# Patient Record
Sex: Female | Born: 1970 | Race: Black or African American | Hispanic: No | Marital: Single | Smoking: Never smoker
Health system: Southern US, Community
[De-identification: ages and names within clinical notes are randomized; demographics above are authoritative.]

## PROBLEM LIST (undated history)

## (undated) DIAGNOSIS — N189 Chronic kidney disease, unspecified: Secondary | ICD-10-CM

## (undated) DIAGNOSIS — I10 Essential (primary) hypertension: Secondary | ICD-10-CM

## (undated) DIAGNOSIS — I5030 Unspecified diastolic (congestive) heart failure: Secondary | ICD-10-CM

## (undated) DIAGNOSIS — I509 Heart failure, unspecified: Secondary | ICD-10-CM

## (undated) DIAGNOSIS — D649 Anemia, unspecified: Secondary | ICD-10-CM

## (undated) HISTORY — DX: Chronic kidney disease, unspecified: N18.9

## (undated) HISTORY — DX: Essential (primary) hypertension: I10

## (undated) HISTORY — DX: Unspecified diastolic (congestive) heart failure: I50.30

## (undated) HISTORY — PX: EYE SURGERY: SHX253

## (undated) HISTORY — PX: TUBAL LIGATION: SHX77

## (undated) HISTORY — PX: CHOLECYSTECTOMY: SHX55

---

## 2000-07-13 ENCOUNTER — Ambulatory Visit (HOSPITAL_COMMUNITY): Admission: RE | Admit: 2000-07-13 | Discharge: 2000-07-13 | Payer: Self-pay | Admitting: General Surgery

## 2001-04-22 ENCOUNTER — Encounter: Payer: Self-pay | Admitting: Emergency Medicine

## 2001-04-22 ENCOUNTER — Emergency Department (HOSPITAL_COMMUNITY): Admission: EM | Admit: 2001-04-22 | Discharge: 2001-04-22 | Payer: Self-pay | Admitting: Emergency Medicine

## 2002-05-24 ENCOUNTER — Emergency Department (HOSPITAL_COMMUNITY): Admission: EM | Admit: 2002-05-24 | Discharge: 2002-05-24 | Payer: Self-pay | Admitting: Internal Medicine

## 2002-06-09 ENCOUNTER — Encounter: Payer: Self-pay | Admitting: Emergency Medicine

## 2002-06-09 ENCOUNTER — Emergency Department (HOSPITAL_COMMUNITY): Admission: EM | Admit: 2002-06-09 | Discharge: 2002-06-09 | Payer: Self-pay | Admitting: Emergency Medicine

## 2002-08-07 ENCOUNTER — Emergency Department (HOSPITAL_COMMUNITY): Admission: EM | Admit: 2002-08-07 | Discharge: 2002-08-07 | Payer: Self-pay | Admitting: *Deleted

## 2003-01-18 ENCOUNTER — Emergency Department (HOSPITAL_COMMUNITY): Admission: EM | Admit: 2003-01-18 | Discharge: 2003-01-18 | Payer: Self-pay | Admitting: Emergency Medicine

## 2003-06-16 ENCOUNTER — Emergency Department (HOSPITAL_COMMUNITY): Admission: EM | Admit: 2003-06-16 | Discharge: 2003-06-16 | Payer: Self-pay | Admitting: Emergency Medicine

## 2003-10-13 ENCOUNTER — Inpatient Hospital Stay (HOSPITAL_COMMUNITY): Admission: AD | Admit: 2003-10-13 | Discharge: 2003-10-15 | Payer: Self-pay | Admitting: Pediatrics

## 2004-09-21 ENCOUNTER — Encounter: Admission: RE | Admit: 2004-09-21 | Discharge: 2004-12-20 | Payer: Self-pay | Admitting: Family Medicine

## 2005-01-02 ENCOUNTER — Ambulatory Visit (HOSPITAL_COMMUNITY): Admission: RE | Admit: 2005-01-02 | Discharge: 2005-01-02 | Payer: Self-pay | Admitting: Family Medicine

## 2005-09-03 ENCOUNTER — Emergency Department (HOSPITAL_COMMUNITY): Admission: EM | Admit: 2005-09-03 | Discharge: 2005-09-03 | Payer: Self-pay | Admitting: Emergency Medicine

## 2005-12-03 ENCOUNTER — Emergency Department (HOSPITAL_COMMUNITY): Admission: EM | Admit: 2005-12-03 | Discharge: 2005-12-03 | Payer: Self-pay | Admitting: Emergency Medicine

## 2005-12-12 ENCOUNTER — Encounter (HOSPITAL_COMMUNITY): Admission: RE | Admit: 2005-12-12 | Discharge: 2006-01-11 | Payer: Self-pay | Admitting: Pediatrics

## 2006-09-05 ENCOUNTER — Ambulatory Visit (HOSPITAL_COMMUNITY): Admission: RE | Admit: 2006-09-05 | Discharge: 2006-09-05 | Payer: Self-pay | Admitting: Family Medicine

## 2006-09-09 ENCOUNTER — Emergency Department (HOSPITAL_COMMUNITY): Admission: EM | Admit: 2006-09-09 | Discharge: 2006-09-09 | Payer: Self-pay | Admitting: Emergency Medicine

## 2006-09-22 ENCOUNTER — Emergency Department (HOSPITAL_COMMUNITY): Admission: EM | Admit: 2006-09-22 | Discharge: 2006-09-22 | Payer: Self-pay | Admitting: Emergency Medicine

## 2006-09-24 ENCOUNTER — Emergency Department (HOSPITAL_COMMUNITY): Admission: EM | Admit: 2006-09-24 | Discharge: 2006-09-24 | Payer: Self-pay | Admitting: Emergency Medicine

## 2006-10-12 ENCOUNTER — Ambulatory Visit (HOSPITAL_COMMUNITY): Admission: RE | Admit: 2006-10-12 | Discharge: 2006-10-12 | Payer: Self-pay | Admitting: *Deleted

## 2006-11-10 ENCOUNTER — Emergency Department (HOSPITAL_COMMUNITY): Admission: EM | Admit: 2006-11-10 | Discharge: 2006-11-10 | Payer: Self-pay | Admitting: Emergency Medicine

## 2006-12-08 ENCOUNTER — Emergency Department (HOSPITAL_COMMUNITY): Admission: EM | Admit: 2006-12-08 | Discharge: 2006-12-08 | Payer: Self-pay | Admitting: Emergency Medicine

## 2007-05-08 ENCOUNTER — Other Ambulatory Visit: Admission: RE | Admit: 2007-05-08 | Discharge: 2007-05-08 | Payer: Self-pay | Admitting: Obstetrics & Gynecology

## 2007-06-14 ENCOUNTER — Inpatient Hospital Stay (HOSPITAL_COMMUNITY): Admission: AD | Admit: 2007-06-14 | Discharge: 2007-06-14 | Payer: Self-pay | Admitting: Gynecology

## 2007-06-19 ENCOUNTER — Ambulatory Visit (HOSPITAL_COMMUNITY): Admission: RE | Admit: 2007-06-19 | Discharge: 2007-06-19 | Payer: Self-pay | Admitting: Obstetrics & Gynecology

## 2007-06-20 ENCOUNTER — Emergency Department (HOSPITAL_COMMUNITY): Admission: EM | Admit: 2007-06-20 | Discharge: 2007-06-20 | Payer: Self-pay | Admitting: Emergency Medicine

## 2007-07-17 ENCOUNTER — Ambulatory Visit (HOSPITAL_COMMUNITY): Admission: RE | Admit: 2007-07-17 | Discharge: 2007-07-17 | Payer: Self-pay | Admitting: Obstetrics & Gynecology

## 2007-10-16 ENCOUNTER — Inpatient Hospital Stay (HOSPITAL_COMMUNITY): Admission: AD | Admit: 2007-10-16 | Discharge: 2007-10-16 | Payer: Self-pay | Admitting: Family Medicine

## 2007-10-28 ENCOUNTER — Ambulatory Visit (HOSPITAL_COMMUNITY): Admission: RE | Admit: 2007-10-28 | Discharge: 2007-10-28 | Payer: Self-pay | Admitting: Obstetrics & Gynecology

## 2007-11-03 ENCOUNTER — Inpatient Hospital Stay (HOSPITAL_COMMUNITY): Admission: AD | Admit: 2007-11-03 | Discharge: 2007-11-06 | Payer: Self-pay | Admitting: Obstetrics & Gynecology

## 2007-11-03 ENCOUNTER — Encounter: Payer: Self-pay | Admitting: Obstetrics & Gynecology

## 2008-09-18 ENCOUNTER — Emergency Department (HOSPITAL_COMMUNITY): Admission: EM | Admit: 2008-09-18 | Discharge: 2008-09-18 | Payer: Self-pay | Admitting: Emergency Medicine

## 2008-10-26 ENCOUNTER — Emergency Department (HOSPITAL_COMMUNITY): Admission: EM | Admit: 2008-10-26 | Discharge: 2008-10-26 | Payer: Self-pay | Admitting: Emergency Medicine

## 2009-10-28 ENCOUNTER — Emergency Department (HOSPITAL_COMMUNITY): Admission: EM | Admit: 2009-10-28 | Discharge: 2009-10-28 | Payer: Self-pay | Admitting: Emergency Medicine

## 2010-03-20 ENCOUNTER — Encounter: Payer: Self-pay | Admitting: Obstetrics & Gynecology

## 2010-04-19 ENCOUNTER — Emergency Department (HOSPITAL_COMMUNITY): Payer: Medicaid Other

## 2010-04-19 ENCOUNTER — Emergency Department (HOSPITAL_COMMUNITY)
Admission: EM | Admit: 2010-04-19 | Discharge: 2010-04-19 | Disposition: A | Discharge status: Home / Self Care | Payer: Medicaid Other | Attending: Emergency Medicine | Admitting: Emergency Medicine

## 2010-04-19 DIAGNOSIS — M25519 Pain in unspecified shoulder: Secondary | ICD-10-CM | POA: Insufficient documentation

## 2010-04-24 ENCOUNTER — Emergency Department (HOSPITAL_COMMUNITY)
Admission: EM | Admit: 2010-04-24 | Discharge: 2010-04-25 | Disposition: A | Discharge status: Home / Self Care | Payer: No Typology Code available for payment source | Attending: Emergency Medicine | Admitting: Emergency Medicine

## 2010-04-24 DIAGNOSIS — IMO0002 Reserved for concepts with insufficient information to code with codable children: Secondary | ICD-10-CM | POA: Insufficient documentation

## 2010-04-24 DIAGNOSIS — Y92410 Unspecified street and highway as the place of occurrence of the external cause: Secondary | ICD-10-CM | POA: Insufficient documentation

## 2010-07-12 NOTE — Op Note (Signed)
NAMETAINA, DYSERT                ACCOUNT NO.:  [id number]   MEDICAL RECORD NO.:  [id number]          PATIENT TYPE:  INP   LOCATION:  9101                          FACILITY:  [facility]   PHYSICIAN:  Florian Buff, M.D.   DATE OF BIRTH:  1970-08-05   DATE OF PROCEDURE:  11/03/2007  DATE OF DISCHARGE:  11/06/2007                               OPERATIVE REPORT   PREOPERATIVE DIAGNOSES:  1. Intrauterine pregnancy at 21 weeks' gestation.  2. Previous cesarean section.  3. Hypertension.  4. Class B diabetes.   POSTOPERATIVE DIAGNOSES:  1. Intrauterine pregnancy at 85 weeks' gestation.  2. Previous cesarean section.  3. Hypertension.  4. Class B diabetes.   PROCEDURE:  1. Repeat transverse cesarean section.  2. Tubal ligation.   SURGEON:  Florian Buff, MD   ANESTHESIA:  Spinal.   FINDINGS:  The patient had 2 previous C-sections.  She is class B  diabetic.  She is planning to undergo a repeat.  She came in with  rupture of membranes, stable for planned surgery.  She delivered a 5-  pound 12-ounce female infant with Apgars of 9 and 9 and 3-vessel cord.  Cord blood and cord gases were sent.  Placenta was normal.  Uterus,  tubes, and ovaries were normal.  The patient desires tubal ligation.   DESCRIPTION OF OPERATION:  The patient was taken to the operating room  and placed in the sitting position with spinal anesthetic placed in  supine position with uterine tilt to the left, prepped and draped in  usual sterile fashion.  Foley catheter was placed.  Pfannenstiel skin  incision was made and carried down sharply into the rectus fascia, which  was scored in midline and extended laterally.  Fascia taken off the  muscles superiorly and inferiorly without difficulty.  The muscles were  divided.  Peritoneal cavity was entered.  bladder blade was placed.  Vesicouterine serosal flap was created.  A low-transverse hysterotomy  incision was made.  Over the incision was delivered a viable female  infant  at 10:46 on November 03, 2007, weighing 5 pounds 12 ounces, Apgars 9 and  9, 3-vessel cord.  Cord blood and cord gases were sent.  Placenta was  normal and intact.  Uterus exteriorized, closed in two layers, first  being running-interlocking layer with the second being imbricating  layer.  Modified Pomeroy bilateral ligation was performed.  The uterine  time was achieved.  All pedicles were hemostatic.  Pelvis was irrigated  vigorously.  The uterus replaced in peritoneal cavity.  Muscle of the  peritoneum reapproximated loosely.  Fascia  closed using 0-Vicryl running.  Subcutaneous tissues were made  hemostatic and irrigated.  Skin was closed using skin staples.  The  patient tolerated the procedure well.  She experienced 750 mL blood loss  and was taken to recovery room in good stable condition with all counts  correct x3.      Florian Buff, M.D.  Electronically Signed     LHE/MEDQ  D:  01/08/2008  T:  01/09/2008  Job:  [id number]

## 2010-07-12 NOTE — Procedures (Signed)
Ana [name], Ana [name]                ACCOUNT NO.:  [id number]   MEDICAL RECORD NO.:  [id number]          PATIENT TYPE:  OUT   LOCATION:  RAD                           FACILITY:  [facility]   PHYSICIAN:  Leslye Peer, MD       DATE OF BIRTH:  1970-03-21   DATE OF PROCEDURE:  10/12/2006  DATE OF DISCHARGE:                                ECHOCARDIOGRAM   REFERRING:  Dr. Cleta Alberts and Dr. Mathis Bud.   INDICATIONS:  A 40 year old female with hypertension and diabetes  referred for evaluation of LV function due to chest pain.   Technical quality of this study is adequate.   The aorta measures normally at 2.2 cm.   Left atrium also measures normally at 3.9 cm.   The interventricular septum and posterior wall are both mildly thickened  measured at 1.5 cm and 1.3 centimeters, respectively.   The aortic valve is trileaflet and structurally normal.  No limitation  to leaflet excursion is appreciated.  No aortic insufficiency is noted.  Doppler interrogation of the aortic valve reveals a peak velocity of 1.6  meters per second corresponding to a peak rate of 10 mmHg and a mean  gradient of 6 mmHg.   The mitral valve appears grossly structurally normal.  No mitral valve  prolapse is noted.  Trivial mitral regurgitation is noted.  Doppler  interrogation of mitral valve is within normal limits.   Pulmonic valve is structurally normal.  No significant pulmonic  insufficiency is noted.   Tricuspid valve also appears grossly structurally normal with trivial  tricuspid regurgitation noted.   The left ventricle appears normal in size.  Overall left systolic  function is normal.  No regional wall motion abnormalities are noted.   The right atrium appears grossly normal in size while the right  ventricle is moderately dilated.  Right ventricular systolic function  appears to be preserved.   IMPRESSION:  1. Mild concentric LVH.  2. Mild aortic sclerosis by Doppler interrogation.  However, visually,  the leaflets appear normal without limitation.  3. Trivial mitral and tricuspid regurgitation.  4. Normal left size and systolic function without regional wall motion      abnormality noted.  5. Moderate right ventricular enlargement with preserved right      ventricle systolic function.  6. In the subcostal view only, there is a subtle suggestion of a      possible PFO or ASD across the inner atrial septum; however, this      is not appreciated elsewhere.  This may well be inflow from the      IVC.  Consider bubble study if clinically indicated.           ______________________________  Leslye Peer, MD     AB/MEDQ  D:  10/12/2006  T:  10/13/2006  Job:  [id number]   cc:   Leslye Peer, MD  Fax: [phone]   Cleta Alberts, M.D.

## 2010-07-15 NOTE — Discharge Summary (Signed)
NAME:  [name], [name]                          ACCOUNT NO.:  [id number]   MEDICAL RECORD NO.:  [id number]                   PATIENT TYPE:  INP   LOCATION:  A325                                 FACILITY:  [facility]   PHYSICIAN:  Micah Flesher. Halm, D.O.                DATE OF BIRTH:  10-23-1970   DATE OF ADMISSION:  10/13/2003  DATE OF DISCHARGE:  10/15/2003                                 DISCHARGE SUMMARY   FINAL DIAGNOSES:  1. Hyperglycemia.  2. Diabetes mellitus, type 2.  3. Poor compliance.  4. Dehydration.   HISTORY OF PRESENT ILLNESS:  Ana [name] is a 40 year old female with known  type 2 diabetes with poor medical compliance.  She had not been taking her  medications over the weeks prior to admission.  One week prior to this  admission, she was evaluated in my office and noted to be dehydrated and  have hyperglycemia.  We restarted her glyburide, and she was encouraged to  drink plenty of fluids.  She was evaluated one week later and felt to be  only minimally better and felt worse.  She was admitted to the hospital for  further management.   HOSPITAL COURSE:  Ms. Dall was admitted to the hospital and placed on  intravenous fluids.  She was placed on a sliding scale insulin to help  normalize her high glucose levels.  Initially, her glucoses were in the mid  to high 200 range.   While in the hospital, we obtained fairly good improvement of her  hyperglycemic state.  She was also reinstituted on her oral medications.  Her ambulation status improved slowly while in the hospital, and she was  discharged in stable condition.   DISCHARGE INSTRUCTIONS:  The patient was discharged to home and instructed  to increase activity slowly.  She was advised and educated on a no  concentrated sweets, 2200 calorie per day diabetic diet.  She is to avoid  caffeine drinks.   FOLLOW UP:  She is to follow up with our office in one to two weeks.   DISCHARGE MEDICATIONS:  1. Glyburide 5 mg, two  tablets twice a day.  2. Glucophage 500 mg, one tablet twice a day.  3. Tylenol 500 mg, two tablets every six hours as needed.     ___________________________________________                                         Micah Flesher. Cleta Alberts, D.O.   [facility]/MEDQ  D:  10/22/2003  T:  10/22/2003  Job:  [id number]

## 2010-07-15 NOTE — Procedures (Signed)
Ana, [name]                ACCOUNT NO.:  [id number]   MEDICAL RECORD NO.:  [id number]          PATIENT TYPE:  OUT   LOCATION:  RESP                          FACILITY:  [facility]   PHYSICIAN:  Edward L. Luan Pulling, M.D.DATE OF BIRTH:  [birth year]   DATE OF PROCEDURE:  01/02/2005  DATE OF DISCHARGE:                              PULMONARY FUNCTION TEST   RESULTS:  This is an inadequate pulmonary function test.  The patient was  unable to maintain forced expiration and data is unreliable.      Edward L. Luan Pulling, M.D.  Electronically Signed     [facility]/MEDQ  D:  01/09/2005  T:  01/10/2005  Job:  [id number]

## 2010-07-15 NOTE — H&P (Signed)
NAME:  Ana [name], Ana [name]                          ACCOUNT NO.:  [id number]   MEDICAL RECORD NO.:  [id number]                   PATIENT TYPE:  INP   LOCATION:  A325                                 FACILITY:  [facility]   PHYSICIAN:  Tammi Sou, M.D.             DATE OF BIRTH:  1971/02/01   DATE OF ADMISSION:  10/13/2003  DATE OF DISCHARGE:                                HISTORY & PHYSICAL   CHIEF COMPLAINT:  Headache and shaking.   HISTORY OF PRESENT ILLNESS:  Neoma Laming is a 40 year old African-American  female with a history of type 2 diabetes and medical noncompliance.  She has  had approximately 1-2 weeks of intermittent nausea, loss of appetite,  feeling of fatigue and malaise.  She denies any fever, cough, or dysuria.  She has been to the clinic once in the last 1 week and noted to have  hyperglycemia and mild dehydration and was noncompliant with her medications  and glucose monitoring at that point.  She was encouraged to drink lots of  fluids and she was placed back on Glyburide 10 mg b.i.d.  She did feel  better for a couple of days; but, again, stopped eating and still feels weak  and does not want to eat anything.  She says that she is not thirsty at all.  She is still having lots of urination.  She has mild abdominal pain  diffusely.  No vomiting or diarrhea.  Her last bowel movement was yesterday.  She is not monitoring her glucose because she lost her machine in a move.   PAST MEDICAL HISTORY:  1. Diabetes type 2, non-insulin-requiring.  She has a history of poor     compliance.  2. In 1998 she had pneumonia.   PAST SURGICAL HISTORY:  Cholecystectomy and right breast cyst removal.   MEDICATIONS:  Glyburide 5 mg tabs 2 b.i.d.   ALLERGIES:  No known drug allergies.   SOCIAL HISTORY:  She is married and has 4 children and lives in [place]  and she is not working at this point.  She denies use of tobacco, alcohol,  or drugs.   REVIEW OF SYSTEMS:  See HPI.  In  addition no rash, no vision or hearing  changes, no musculoskeletal pain or swelling.  No melena or hematochezia.   PHYSICAL EXAMINATION:  VITAL SIGNS:  Temperature 97, pulse 82, blood  pressure 137/88, respirations 20, weight 187-1/2 pounds.  GENERAL:  In general she is alert, but very tired appearing and sitting with  a cover over herself.  She is moderately obese.  She is nontoxic appearing.  HEENT:  Pupils are equal, round, and reacted to light.  There are muddy  sclerae bilaterally, but with no icterus or injection.  Tympanic membranes  with good light reflex and landmarks bilaterally.  Oropharynx is pink with  no lesions.  Mucosa is moist.  No erythema.  NECK:  Supple without lymphadenopathy  or thyromegaly.  LUNGS:  Clear to auscultation bilaterally with breathing nonlabored.  CARDIOVASCULAR:  Shows a regular rhythm and rate with no murmur, rub, or  gallop.  ABDOMEN:  Her abdomen is soft with mild diffuse tenderness without rebound  or guarding.  Bowel sounds are normoactive. There is no mass palpated.  No  hepatosplenomegaly appreciated.  Abdomen is rotund.  EXTREMITIES:  No clubbing, cyanosis, or edema.  Normal sensation on the  feet.  No ulcerations seen.   LABS:  Accu-Check in the office 257.  CBC, CMET, lipase are all pending.  Hemoglobin A1c on 10/09/2003 was 12.3%, creatinine was 0.7.  Basic metabolic  panel at that time was normal with the exception of a sodium of 134 and a  glucose of 403.   ASSESSMENT AND PLAN:  Hyperglycemia in a type 2 diabetic:  Attempted to get  the patient under control as an outpatient, but this failed, so we will  admit for IV fluids, monitoring of glucose, insulin as needed, and  observation for improvement.     ___________________________________________                                         Tammi Sou, M.D.   PHM/MEDQ  D:  10/13/2003  T:  10/13/2003  Job:  [id number]

## 2010-08-02 ENCOUNTER — Emergency Department (HOSPITAL_COMMUNITY)
Admission: EM | Admit: 2010-08-02 | Discharge: 2010-08-02 | Disposition: A | Discharge status: Home / Self Care | Payer: Medicaid Other | Attending: Emergency Medicine | Admitting: Emergency Medicine

## 2010-08-02 ENCOUNTER — Emergency Department (HOSPITAL_COMMUNITY): Payer: Medicaid Other

## 2010-08-02 DIAGNOSIS — E119 Type 2 diabetes mellitus without complications: Secondary | ICD-10-CM | POA: Insufficient documentation

## 2010-08-02 DIAGNOSIS — J40 Bronchitis, not specified as acute or chronic: Secondary | ICD-10-CM | POA: Insufficient documentation

## 2010-10-22 ENCOUNTER — Emergency Department (HOSPITAL_COMMUNITY): Payer: Medicaid Other

## 2010-10-22 ENCOUNTER — Emergency Department (HOSPITAL_COMMUNITY)
Admission: EM | Admit: 2010-10-22 | Discharge: 2010-10-22 | Disposition: A | Discharge status: Home / Self Care | Payer: Medicaid Other | Attending: Emergency Medicine | Admitting: Emergency Medicine

## 2010-10-22 ENCOUNTER — Encounter: Payer: Self-pay | Admitting: Emergency Medicine

## 2010-10-22 DIAGNOSIS — E119 Type 2 diabetes mellitus without complications: Secondary | ICD-10-CM | POA: Insufficient documentation

## 2010-10-22 DIAGNOSIS — IMO0002 Reserved for concepts with insufficient information to code with codable children: Secondary | ICD-10-CM | POA: Insufficient documentation

## 2010-10-22 DIAGNOSIS — X500XXA Overexertion from strenuous movement or load, initial encounter: Secondary | ICD-10-CM | POA: Insufficient documentation

## 2010-10-22 MED ORDER — HYDROCODONE-ACETAMINOPHEN 5-325 MG PO TABS: ORAL_TABLET | ORAL | Status: AC

## 2010-10-22 MED ORDER — NAPROXEN 500 MG PO TABS: 500.0000 mg | ORAL_TABLET | Freq: Two times a day (BID) | ORAL | Status: DC

## 2010-10-22 MED ORDER — HYDROCODONE-ACETAMINOPHEN 5-325 MG PO TABS
1.0000 | ORAL_TABLET | Freq: Once | ORAL | Status: AC
  Administered 2010-10-22: 1 via ORAL
  Filled 2010-10-22: qty 1

## 2010-10-22 NOTE — ED Notes (Signed)
Patient c/o right shoulder pain. Denies any injury. Reports being seen here before for same reason. Patient also c/o headache. Denies any nausea or vomiting.

## 2010-10-22 NOTE — ED Provider Notes (Signed)
History     CSN: [id number] Arrival date & time: 10/22/2010 10:39 AM  Chief Complaint  Patient presents with  . Shoulder Pain  . Headache   HPI Comments: Patient c/o right shoulder pain for several days.  States the pain began after lifting her son.  Pain is worse with movement of the shoulder joint.  She denies numbness or weakness of the arm.  States she had had similar sx's in this shoulder previously.  Also c/o frontal headache that began this am.  She denies fever, vomiting , visual change , neck pain or stiffness  Patient is a 40 y.o. female presenting with shoulder pain. The history is provided by the patient.  Shoulder Pain This is a new problem. The current episode started in the past 7 days. The problem occurs constantly. The problem has been unchanged. Associated symptoms include arthralgias and headaches. Pertinent negatives include no abdominal pain, change in bowel habit, chest pain, chills, congestion, coughing, fatigue, fever, joint swelling, myalgias, nausea, neck pain, numbness, rash, sore throat, vomiting or weakness. Exacerbated by: movement. She has tried acetaminophen for the symptoms. The treatment provided no relief.    Past Medical History  Diagnosis Date  . Diabetes mellitus     Past Surgical History  Procedure Date  . Cesarean section   . Cholecystectomy     Family History  Problem Relation Age of Onset  . Kidney failure Mother   . Diabetes Mother   . Kidney failure Father   . Kidney failure Brother   . Diabetes Brother     History  Substance Use Topics  . Smoking status: Never Smoker   . Smokeless tobacco: Never Used  . Alcohol Use: No    OB History    Grav Para Term Preterm Abortions TAB SAB Ect Mult Living   6 5  5 1  1          Review of Systems  Constitutional: Negative for fever, chills, activity change, appetite change and fatigue.  HENT: Negative for congestion, sore throat, neck pain and neck stiffness.   Respiratory: Negative for  cough.   Cardiovascular: Negative for chest pain.  Gastrointestinal: Negative for nausea, vomiting, abdominal pain and change in bowel habit.  Musculoskeletal: Positive for arthralgias. Negative for myalgias, back pain, joint swelling and gait problem.  Skin: Negative for rash.  Neurological: Positive for headaches. Negative for dizziness, facial asymmetry, speech difficulty, weakness and numbness.  Hematological: Does not bruise/bleed easily.  All other systems reviewed and are negative.    Physical Exam  BP 135/71  Pulse 73  Temp(Src) 98.2 F (36.8 C) (Oral)  Resp 17  Ht 5\' 3"  (1.6 m)  Wt 160 lb (72.576 kg)  BMI 28.34 kg/m2  SpO2 100%  LMP 09/28/2010  Physical Exam  Nursing note and vitals reviewed. Constitutional: She is oriented to person, place, and time. She appears well-developed and well-nourished. No distress.  HENT:  Head: Normocephalic and atraumatic.  Right Ear: External ear normal.  Left Ear: External ear normal.  Mouth/Throat: Oropharynx is clear and moist.  Eyes: EOM are normal. Pupils are equal, round, and reactive to light.  Neck: Normal range of motion. Neck supple. No tracheal tenderness, no spinous process tenderness and no muscular tenderness present. No Brudzinski's sign and no Kernig's sign noted. No thyromegaly present.  Cardiovascular: Normal rate, regular rhythm and normal heart sounds.   Pulmonary/Chest: Effort normal and breath sounds normal.  Musculoskeletal: She exhibits tenderness. She exhibits no edema.  Right shoulder: She exhibits tenderness, bony tenderness and crepitus. She exhibits normal range of motion, no swelling, no effusion, no laceration, normal pulse and normal strength.  Lymphadenopathy:    She has no cervical adenopathy.  Neurological: She is alert and oriented to person, place, and time. She has normal reflexes. No cranial nerve deficit. She exhibits normal muscle tone.  Skin: Skin is warm and dry.    ED Course    Procedures  MDM   ttp of the right AC joint.  Pain is reproduced with palp and abduction of the shoulder.  Sensation intact, CR<2 sec, radial pulse is strong, mild crepitus on movement.  No deformity .  I have reviewed the imagining and discussed findings with the patient .  She agrees to close f/u with ortho     Dg Shoulder Right  10/22/2010  *RADIOLOGY REPORT*  Clinical Data: Shoulder pain  RIGHT SHOULDER - 2+ VIEW  Comparison: 04/19/2010  Findings: No evidence for acute fracture.  No shoulder separation or dislocation.  There are some degenerative changes at the acromioclavicular joint.  IMPRESSION: No acute bony findings.  Original Report Authenticated By: ERIC A. MANSELL, M.D.      The patient appears reasonably screened and/or stabilized for discharge and I doubt any other medical condition or other [facility] requiring further screening, evaluation, or treatment in the ED at this time prior to discharge.   Filed Vitals:   10/22/10 1035  BP: 135/71  Pulse: 73  Temp: 98.2 F (36.8 C)  Resp: 17     Phu Record L. [place], [place] 10/22/10 1211

## 2010-10-24 NOTE — ED Provider Notes (Deleted)
Medical screening examination/treatment/procedure(s) were performed by non-physician practitioner and as supervising physician I was immediately available for consultation/collaboration.   Brandace Cargle B. Karle Starch, MD 10/24/10 1616

## 2010-10-28 NOTE — ED Provider Notes (Signed)
Medical screening examination/treatment/procedure(s) were performed by non-physician practitioner and as supervising physician I was immediately available for consultation/collaboration.   Charles B. Karle Starch, MD 10/28/10 1306

## 2010-11-22 LAB — BASIC METABOLIC PANEL
BUN: 6
Calcium: 9.1
GFR calc non Af Amer: >60
Glucose, Bld: 99
Potassium: 3.5

## 2010-11-22 LAB — CBC
HCT: 28.6 — ABNORMAL LOW
Platelets: 316
RDW: 13

## 2010-11-22 LAB — DIFFERENTIAL
Basophils Absolute: 0
Basophils Relative: 1
Eosinophils Relative: 2
Lymphocytes Relative: 26

## 2010-11-22 LAB — URINALYSIS, ROUTINE W REFLEX MICROSCOPIC
Ketones, ur: NEGATIVE
Nitrite: NEGATIVE
Protein, ur: NEGATIVE
Urobilinogen, UA: 2 — ABNORMAL HIGH
pH: 6.5

## 2010-11-30 LAB — GLUCOSE, CAPILLARY
Glucose-Capillary: 102 — ABNORMAL HIGH
Glucose-Capillary: 171 — ABNORMAL HIGH
Glucose-Capillary: 60 — ABNORMAL LOW
Glucose-Capillary: 62 — ABNORMAL LOW
Glucose-Capillary: 63 — ABNORMAL LOW
Glucose-Capillary: 70
Glucose-Capillary: 72
Glucose-Capillary: 76
Glucose-Capillary: 82
Glucose-Capillary: 82
Glucose-Capillary: 84

## 2010-11-30 LAB — CBC
HCT: 20.3 — ABNORMAL LOW
MCHC: 32.7
MCHC: 32.7
MCHC: 33.4
MCV: 86
MCV: 86.3
Platelets: 311
RBC: 2.35 — ABNORMAL LOW
RBC: 2.7 — ABNORMAL LOW
RDW: 15
RDW: 15.1
WBC: 10.4

## 2010-11-30 LAB — RPR: RPR Ser Ql: NONREACTIVE

## 2010-12-08 LAB — URINALYSIS, ROUTINE W REFLEX MICROSCOPIC
Glucose, UA: 100 — AB
Nitrite: NEGATIVE
Specific Gravity, Urine: >1.03 — ABNORMAL HIGH
pH: 6

## 2010-12-12 LAB — BASIC METABOLIC PANEL
BUN: 5 — ABNORMAL LOW
BUN: 7
Calcium: 9
Chloride: 102
Chloride: 103
Creatinine, Ser: 0.47
GFR calc Af Amer: >60
GFR calc Af Amer: >60
GFR calc non Af Amer: >60
GFR calc non Af Amer: >60
Potassium: 3.4 — ABNORMAL LOW
Sodium: 137

## 2010-12-12 LAB — DIFFERENTIAL
Basophils Absolute: 0
Eosinophils Absolute: 0.2
Eosinophils Relative: 2
Lymphocytes Relative: 13
Lymphocytes Relative: 17
Lymphs Abs: 1.4
Lymphs Abs: 1.6
Monocytes Relative: 3
Neutro Abs: 7.3
Neutro Abs: 8.8 — ABNORMAL HIGH
Neutrophils Relative %: 82 — ABNORMAL HIGH

## 2010-12-12 LAB — POCT CARDIAC MARKERS
CKMB, poc: 1.7
CKMB, poc: <1 — ABNORMAL LOW
Myoglobin, poc: 33.9
Myoglobin, poc: 38.6
Operator id: 209231
Operator id: 286941
Troponin i, poc: <0.05

## 2010-12-12 LAB — CBC
HCT: 36.2
MCV: 85.1
Platelets: 354
Platelets: 355
RBC: 4.45
RDW: 12.3
WBC: 10.6 — ABNORMAL HIGH
WBC: 9.5

## 2010-12-13 LAB — CBC
MCV: 85.4
Platelets: 310
RBC: 4.24
WBC: 6.8

## 2010-12-13 LAB — DIFFERENTIAL
Basophils Relative: 1
Eosinophils Absolute: 0.1
Lymphs Abs: 2.5
Neutro Abs: 3.7
Neutrophils Relative %: 54

## 2010-12-13 LAB — HCG, QUANTITATIVE, PREGNANCY: hCG, Beta Chain, Quant, S: 23 — ABNORMAL HIGH

## 2011-01-27 ENCOUNTER — Emergency Department (HOSPITAL_COMMUNITY)
Admission: EM | Admit: 2011-01-27 | Discharge: 2011-01-27 | Disposition: A | Discharge status: Home / Self Care | Payer: Medicaid Other | Attending: Emergency Medicine | Admitting: Emergency Medicine

## 2011-01-27 ENCOUNTER — Encounter (HOSPITAL_COMMUNITY): Payer: Self-pay | Admitting: *Deleted

## 2011-01-27 ENCOUNTER — Emergency Department (HOSPITAL_COMMUNITY): Payer: Medicaid Other

## 2011-01-27 DIAGNOSIS — S62639A Displaced fracture of distal phalanx of unspecified finger, initial encounter for closed fracture: Secondary | ICD-10-CM | POA: Insufficient documentation

## 2011-01-27 DIAGNOSIS — S62609A Fracture of unspecified phalanx of unspecified finger, initial encounter for closed fracture: Secondary | ICD-10-CM

## 2011-01-27 DIAGNOSIS — E119 Type 2 diabetes mellitus without complications: Secondary | ICD-10-CM | POA: Insufficient documentation

## 2011-01-27 DIAGNOSIS — W1809XA Striking against other object with subsequent fall, initial encounter: Secondary | ICD-10-CM | POA: Insufficient documentation

## 2011-01-27 DIAGNOSIS — Y92009 Unspecified place in unspecified non-institutional (private) residence as the place of occurrence of the external cause: Secondary | ICD-10-CM | POA: Insufficient documentation

## 2011-01-27 MED ORDER — NAPROXEN 500 MG PO TABS: 500.0000 mg | ORAL_TABLET | Freq: Two times a day (BID) | ORAL | Status: DC

## 2011-01-27 MED ORDER — HYDROCODONE-ACETAMINOPHEN 5-325 MG PO TABS
1.0000 | ORAL_TABLET | Freq: Once | ORAL | Status: AC
  Administered 2011-01-27: 1 via ORAL
  Filled 2011-01-27: qty 1

## 2011-01-27 MED ORDER — HYDROCODONE-ACETAMINOPHEN 5-325 MG PO TABS: ORAL_TABLET | ORAL | Status: AC

## 2011-01-27 NOTE — ED Notes (Signed)
Fall last night-states lost balance while standing still in kitchen and fell backward landing on left hand; c/o left hand and left middle finger pain; denies LOC

## 2011-01-27 NOTE — ED Provider Notes (Signed)
History     CSN: [id number] Arrival date & time: 01/27/2011  1:31 PM   First MD Initiated Contact with Patient 01/27/11 1337      Chief Complaint  Patient presents with  . Fall  . Hand Pain    (Consider location/radiation/quality/duration/timing/severity/associated sxs/prior treatment) HPI Comments: Patient c/o pain and swelling of her left middle finger.  States she slipped in her kitchen and fell against the stove and injured the finger.  She denies preceding symptoms, numbness or weakness  Patient is a 40 y.o. female presenting with hand pain. The history is provided by the patient.  Hand Pain This is a new problem. The current episode started yesterday. The problem occurs constantly. The problem has been unchanged. Associated symptoms include arthralgias and joint swelling. Pertinent negatives include no chest pain, chills, fever, headaches, myalgias, nausea, neck pain, numbness, rash, sore throat, vomiting or weakness. Exacerbated by: movement and palpation. She has tried nothing for the symptoms. The treatment provided no relief.    Past Medical History  Diagnosis Date  . Diabetes mellitus     Past Surgical History  Procedure Date  . Cesarean section   . Cholecystectomy     Family History  Problem Relation Age of Onset  . Kidney failure Mother   . Diabetes Mother   . Kidney failure Father   . Kidney failure Brother   . Diabetes Brother     History  Substance Use Topics  . Smoking status: Never Smoker   . Smokeless tobacco: Never Used  . Alcohol Use: No    OB History    Grav Para Term Preterm Abortions TAB SAB Ect Mult Living   6 5  5 1  1          Review of Systems  Constitutional: Negative for fever and chills.  HENT: Negative for sore throat, trouble swallowing, neck pain and neck stiffness.   Respiratory: Negative for shortness of breath and wheezing.   Cardiovascular: Negative for chest pain.  Gastrointestinal: Negative for nausea and vomiting.    Musculoskeletal: Positive for joint swelling and arthralgias. Negative for myalgias and back pain.  Skin: Negative for rash and wound.  Neurological: Negative for dizziness, facial asymmetry, weakness, numbness and headaches.  Hematological: Does not bruise/bleed easily.  Psychiatric/Behavioral: Negative for confusion.  All other systems reviewed and are negative.    Allergies  Zantac  Home Medications   Current Outpatient Rx  Name Route Sig Dispense Refill  . ACETAMINOPHEN 500 MG PO TABS Oral Take 1,000 mg by mouth every 6 (six) hours as needed. Pain     . NAPROXEN 500 MG PO TABS Oral Take 1 tablet (500 mg total) by mouth 2 (two) times daily. 21 tablet 0    BP 145/85  Pulse 98  Temp(Src) 98.7 F (37.1 C) (Oral)  Resp 20  Ht 5\' 3"  (1.6 m)  Wt 187 lb (84.823 kg)  BMI 33.13 kg/m2  SpO2 100%  LMP 12/25/2010  Physical Exam  Nursing note and vitals reviewed. Constitutional: She is oriented to person, place, and time. She appears well-developed and well-nourished. No distress.  HENT:  Head: Normocephalic and atraumatic.  Mouth/Throat: Oropharynx is clear and moist.  Neck: Normal range of motion. Neck supple.  Cardiovascular: Normal rate, regular rhythm and normal heart sounds.   Pulmonary/Chest: Effort normal and breath sounds normal.  Musculoskeletal: She exhibits edema and tenderness.       Left hand: She exhibits decreased range of motion, tenderness, bony tenderness and swelling.  She exhibits normal two-point discrimination, normal capillary refill and no laceration. normal sensation noted. Normal strength noted.       Hands: Lymphadenopathy:    She has no cervical adenopathy.  Neurological: She is alert and oriented to person, place, and time. No cranial nerve deficit. She exhibits normal muscle tone. Coordination normal.  Skin: Skin is warm and dry.    ED Course  SPLINT APPLICATION Date/Time: 99991111 3:00 PM Performed by: Vanessa [place], Maydelin Deming L. Authorized by:  Hale Bogus Consent: Verbal consent obtained. Written consent not obtained. Consent given by: patient Patient understanding: patient states understanding of the procedure being performed Patient consent: the patient's understanding of the procedure matches consent given Procedure consent: procedure consent matches procedure scheduled Imaging studies: imaging studies available Patient identity confirmed: verbally with patient Time out: Immediately prior to procedure a "time out" was called to verify the correct patient, procedure, equipment, support staff and site/side marked as required. Location details: left long finger Splint type: static finger Post-procedure: The splinted body part was neurovascularly unchanged following the procedure. Patient tolerance: Patient tolerated the procedure well with no immediate complications.   (including critical care time)  Dg Finger Middle Left  01/27/2011  *RADIOLOGY REPORT*  Clinical Data: [place] last night with pain  LEFT MIDDLE FINGER 2+V  Comparison: None.  Findings: There is a nondisplaced fracture through the base of the distal phalanx of the right third digit.  This fracture does not appear to extend to the DIP joint space.  No other abnormality is seen.  Joint spaces appear normal.  IMPRESSION: Nondisplaced fracture through the base of the distal phalanx of the left third digit.  Original Report Authenticated By: Joretta Bachelor, M.D.        MDM    3:52 PM consulted Dr. Luna Glasgow.  Patient agrees to call his office on Monday to arrange f/u       Anayelli Lai L. [place], [place] 01/28/11 2259

## 2011-01-29 NOTE — ED Provider Notes (Signed)
Medical screening examination/treatment/procedure(s) were performed by non-physician practitioner and as supervising physician I was immediately available for consultation/collaboration.   Mervin Kung, MD 01/29/11 1228

## 2011-07-11 ENCOUNTER — Encounter (HOSPITAL_COMMUNITY): Payer: Self-pay

## 2011-07-11 ENCOUNTER — Emergency Department (HOSPITAL_COMMUNITY)
Admission: EM | Admit: 2011-07-11 | Discharge: 2011-07-11 | Disposition: A | Discharge status: Home / Self Care | Payer: Medicaid Other | Attending: Emergency Medicine | Admitting: Emergency Medicine

## 2011-07-11 ENCOUNTER — Emergency Department (HOSPITAL_COMMUNITY): Payer: Medicaid Other

## 2011-07-11 DIAGNOSIS — R071 Chest pain on breathing: Secondary | ICD-10-CM | POA: Insufficient documentation

## 2011-07-11 DIAGNOSIS — R0789 Other chest pain: Secondary | ICD-10-CM

## 2011-07-11 DIAGNOSIS — E119 Type 2 diabetes mellitus without complications: Secondary | ICD-10-CM | POA: Insufficient documentation

## 2011-07-11 LAB — CBC
HCT: 37.5 % (ref 36.0–46.0)
Platelets: 239 10*3/uL (ref 150–400)
RBC: 4.23 MIL/uL (ref 3.87–5.11)
RDW: 12.3 % (ref 11.5–15.5)
WBC: 4.8 10*3/uL (ref 4.0–10.5)

## 2011-07-11 LAB — BASIC METABOLIC PANEL
Chloride: 97 mEq/L (ref 96–112)
GFR calc Af Amer: >90 mL/min (ref >90)
Potassium: 4 mEq/L (ref 3.5–5.1)
Sodium: 134 mEq/L — ABNORMAL LOW (ref 135–145)

## 2011-07-11 LAB — DIFFERENTIAL
Basophils Absolute: 0 10*3/uL (ref 0.0–0.1)
Lymphocytes Relative: 37 % (ref 12–46)
Lymphs Abs: 1.8 10*3/uL (ref 0.7–4.0)
Neutro Abs: 2.6 10*3/uL (ref 1.7–7.7)

## 2011-07-11 LAB — TROPONIN I: Troponin I: <0.3 ng/mL (ref <0.30)

## 2011-07-11 LAB — D-DIMER, QUANTITATIVE: D-Dimer, Quant: <0.22 ug/mL-FEU (ref 0.00–0.48)

## 2011-07-11 MED ORDER — HYDROCODONE-ACETAMINOPHEN 5-325 MG PO TABS: ORAL_TABLET | ORAL | Status: AC

## 2011-07-11 MED ORDER — CYCLOBENZAPRINE HCL 10 MG PO TABS: 10.0000 mg | ORAL_TABLET | Freq: Three times a day (TID) | ORAL | Status: AC | PRN

## 2011-07-11 MED ORDER — OXYCODONE-ACETAMINOPHEN 5-325 MG PO TABS
1.0000 | ORAL_TABLET | Freq: Once | ORAL | Status: AC
  Administered 2011-07-11: 1 via ORAL
  Filled 2011-07-11: qty 1

## 2011-07-11 MED ORDER — KETOROLAC TROMETHAMINE 30 MG/ML IJ SOLN
30.0000 mg | Freq: Once | INTRAMUSCULAR | Status: AC
Start: 2011-07-11 — End: 2011-07-11
  Administered 2011-07-11: 30 mg via INTRAVENOUS
  Filled 2011-07-11: qty 1

## 2011-07-11 NOTE — ED Notes (Signed)
Left side cp that started yesterday around 6pm, sob at times, worse when laying flat

## 2011-07-11 NOTE — ED Provider Notes (Signed)
History   This chart was scribed for Ana Feller, DO by Reece Agar. The patient was seen in room APA14/APA14.    CSN: [id number]  Arrival date & time 07/11/11  [id number]   First MD Initiated Contact with Patient 07/11/11 [phone]      Chief Complaint  Patient presents with  . Chest Pain     HPI Pt seen at 0930.   Per pt, c/o gradual onset and persistence of constant upper left sided chest "pain" that began yesterday evening.  Has been assoc with SOB.  Worsens with palpation of the area and laying flat.  Denies palpitations, no cough, no back pain, no abd pain, no N/V/D, no rash, no fevers, no injury.     Past Medical History  Diagnosis Date  . Diabetes mellitus     Past Surgical History  Procedure Date  . Cesarean section   . Cholecystectomy   . Tubal ligation     Family History  Problem Relation Age of Onset  . Kidney failure Mother   . Diabetes Mother   . Kidney failure Father   . Kidney failure Brother   . Diabetes Brother     History  Substance Use Topics  . Smoking status: Never Smoker   . Smokeless tobacco: Never Used  . Alcohol Use: No    OB History    Grav Para Term Preterm Abortions TAB SAB Ect Mult Living   6 5  5 1  1          Review of Systems ROS: Statement: All systems negative except as marked or noted in the HPI; Constitutional: Negative for fever and chills. ; ; Eyes: Negative for eye pain, redness and discharge. ; ; ENMT: Negative for ear pain, hoarseness, nasal congestion, sinus pressure and sore throat. ; ; Cardiovascular: +CP, SOB. Negative for palpitations, diaphoresis, and peripheral edema. ; ; Respiratory: Negative for cough, wheezing and stridor. ; ; Gastrointestinal: Negative for nausea, vomiting, diarrhea, abdominal pain, blood in stool, hematemesis, jaundice and rectal bleeding. . ; ; Genitourinary: Negative for dysuria, flank pain and hematuria. ; ; Musculoskeletal: Negative for back pain and neck pain. Negative for swelling and  trauma.; ; Skin: Negative for pruritus, rash, abrasions, blisters, bruising and skin lesion.; ; Neuro: Negative for headache, lightheadedness and neck stiffness. Negative for weakness, altered level of consciousness , altered mental status, extremity weakness, paresthesias, involuntary movement, seizure and syncope.       Allergies  Ranitidine hcl  Home Medications   Current Outpatient Rx  Name Route Sig Dispense Refill  . HYDROCODONE-ACETAMINOPHEN 7.5-325 MG PO TABS Oral Take 1 tablet by mouth every 6 (six) hours as needed.    . ACETAMINOPHEN 500 MG PO TABS Oral Take 1,000 mg by mouth every 6 (six) hours as needed. Pain     . NAPROXEN 500 MG PO TABS Oral Take 1 tablet (500 mg total) by mouth 2 (two) times daily. 21 tablet 0  . NAPROXEN 500 MG PO TABS Oral Take 1 tablet (500 mg total) by mouth 2 (two) times daily. 30 tablet 0    BP 155/79  Pulse 77  Temp(Src) 97.7 F (36.5 C) (Oral)  Resp 20  Ht 5\' 4"  (1.626 m)  Wt 175 lb (79.379 kg)  BMI 30.04 kg/m2  SpO2 99%  LMP 06/29/2011  Physical Exam 0935: Physical examination:  Nursing notes reviewed; Vital signs and O2 SAT reviewed;  Constitutional: Well developed, Well nourished, Well hydrated, In no acute distress; Head:  Normocephalic, atraumatic; Eyes: EOMI, PERRL, No scleral icterus; ENMT: Mouth and pharynx normal, Mucous membranes moist; Neck: Supple, Full range of motion, No lymphadenopathy; Cardiovascular: Regular rate and rhythm, No murmur, rub, or gallop; Respiratory: Breath sounds clear & equal bilaterally, No rales, rhonchi, wheezes, speaking full sentences with ease, Normal respiratory effort/excursion; Chest: +TTP upper lateral left chest wall which reproduces pt's discomfort., no rash, Movement normal; Abdomen: Soft, Nontender, Nondistended, Normal bowel sounds; Extremities: Pulses normal, No tenderness, No edema, No calf edema or asymmetry.; Neuro: AA&Ox3, Major CN grossly intact.  No gross focal motor or sensory deficits in  extremities.; Skin: Color normal, Warm, Dry    ED Course  Procedures      MDM  MDM Reviewed: previous chart, nursing note and vitals Reviewed previous: ECG Interpretation: ECG, labs and x-ray    Date: 07/11/2011  Rate: 67  Rhythm: normal sinus rhythm and sinus arrhythmia  QRS Axis: normal  Intervals: normal  ST/T Wave abnormalities: normal  Conduction Disutrbances:none  Narrative Interpretation:   Old EKG Reviewed: unchanged; no significant changes from previous EKG dated 07/11/2011.   Results for orders placed during the hospital encounter of 07/11/11  CBC      Component Value Range   WBC 4.8  4.0 - 10.5 (K/uL)   RBC 4.23  3.87 - 5.11 (MIL/uL)   Hemoglobin 12.8  12.0 - 15.0 (g/dL)   HCT 37.5  36.0 - 46.0 (%)   MCV 88.7  78.0 - 100.0 (fL)   MCH 30.3  26.0 - 34.0 (pg)   MCHC 34.1  30.0 - 36.0 (g/dL)   RDW 12.3  11.5 - 15.5 (%)   Platelets 239  150 - 400 (K/uL)  DIFFERENTIAL      Component Value Range   Neutrophils Relative 54  43 - 77 (%)   Neutro Abs 2.6  1.7 - 7.7 (K/uL)   Lymphocytes Relative 37  12 - 46 (%)   Lymphs Abs 1.8  0.7 - 4.0 (K/uL)   Monocytes Relative 7  3 - 12 (%)   Monocytes Absolute 0.3  0.1 - 1.0 (K/uL)   Eosinophils Relative 2  0 - 5 (%)   Eosinophils Absolute 0.1  0.0 - 0.7 (K/uL)   Basophils Relative 0  0 - 1 (%)   Basophils Absolute 0.0  0.0 - 0.1 (K/uL)  TROPONIN I      Component Value Range   Troponin I <0.30  <0.30 (ng/mL)  BASIC METABOLIC PANEL      Component Value Range   Sodium 134 (*) 135 - 145 (mEq/L)   Potassium 4.0  3.5 - 5.1 (mEq/L)   Chloride 97  96 - 112 (mEq/L)   CO2 27  19 - 32 (mEq/L)   Glucose, Bld 297 (*) 70 - 99 (mg/dL)   BUN 9  6 - 23 (mg/dL)   Creatinine, Ser 0.43 (*) 0.50 - 1.10 (mg/dL)   Calcium 9.7  8.4 - 10.5 (mg/dL)   GFR calc non Af Amer >90  >90 (mL/min)   GFR calc Af Amer >90  >90 (mL/min)  D-DIMER, QUANTITATIVE      Component Value Range   D-Dimer, Quant <0.22  0.00 - 0.48 (ug/mL-FEU)   Dg Chest  2 View 07/11/2011  *RADIOLOGY REPORT*  Clinical Data: Chest pain, diabetes  CHEST - 2 VIEW  Comparison: 08/02/2010  Findings: Enlargement of cardiac silhouette. Mediastinal contours and pulmonary vascularity normal. Lungs clear. No pleural effusion or pneumothorax. No acute osseous findings.  IMPRESSION: Enlargement of cardiac  silhouette. No acute abnormalities.  Original Report Authenticated By: Burnetta Sabin, M.D.     11:01 AM:  Improved after meds, wants to go home now.  Doubt PE or ACS as cause for symptoms given normal d-dimer, troponin and unchanged EKG after 2d of constant symptoms.  Dx testing d/w pt and family.  Questions answered.  Verb understanding, agreeable to d/c home with outpt f/u.          I personally performed the services described in this documentation, which was scribed in my presence. The recorded information has been reviewed and considered. [place], DO 07/13/11 1142

## 2011-07-11 NOTE — Discharge Instructions (Signed)
RESOURCE GUIDE  Dental Problems  Patients with Medicaid: [place] [employer] Phone:  [phone]                                                  Phone:  [phone]  If unable to pay or uninsured, contact:  [facility] or [facility]. to become qualified for the adult dental clinic.  Chronic Pain Problems Contact [facility]  [phone] Patients need to be referred by their primary care doctor.  Insufficient Money for Medicine Contact [employer]:  call "211" or [place] [phone].  No Primary Care Doctor Call [facility]  [phone] Other agencies that provide inexpensive medical care    [place]  [phone]    [facility]  [place]  [phone]    [facility]  [phone]    [facility]  [place]  [place]  [phone] [place]   [phone] (emergency services [phone])  [facility]  [phone] [facility] [phone] [facility] [place] [phone] [place] [phone] Residential & Outpatient Substance Abuse Program  [phone]  Abuse/Neglect [place] [phone] [place] [phone] (After Hours)  Emergency [place] [phone]  [place] [facility] [place] [phone] [place] [phone]  MRSA Hotline #:   727-235-4053    [facility] Dept. [address]      [place] Phone:  [phone]                                   Phone:  [phone]                 Phone:  [place] Phone:  [phone]  [place] [phone] (After Hours)   Take the prescriptions as directed.  Apply moist heat or ice to the area(s) of discomfort, for 15 minutes at a time, several times per day for the next few days.  Do not fall asleep on a heating or ice pack.  Call your regular medical doctor today to schedule a follow up appointment this week.  Return to the Emergency Department immediately if worsening.

## 2011-09-12 ENCOUNTER — Emergency Department (HOSPITAL_COMMUNITY): Payer: Medicaid Other

## 2011-09-12 ENCOUNTER — Encounter (HOSPITAL_COMMUNITY): Payer: Self-pay | Admitting: Emergency Medicine

## 2011-09-12 ENCOUNTER — Emergency Department (HOSPITAL_COMMUNITY)
Admission: EM | Admit: 2011-09-12 | Discharge: 2011-09-12 | Disposition: A | Discharge status: Home / Self Care | Payer: Medicaid Other | Attending: Emergency Medicine | Admitting: Emergency Medicine

## 2011-09-12 DIAGNOSIS — IMO0001 Reserved for inherently not codable concepts without codable children: Secondary | ICD-10-CM | POA: Insufficient documentation

## 2011-09-12 DIAGNOSIS — R51 Headache: Secondary | ICD-10-CM | POA: Insufficient documentation

## 2011-09-12 DIAGNOSIS — R079 Chest pain, unspecified: Secondary | ICD-10-CM | POA: Insufficient documentation

## 2011-09-12 DIAGNOSIS — M7918 Myalgia, other site: Secondary | ICD-10-CM

## 2011-09-12 DIAGNOSIS — M545 Low back pain, unspecified: Secondary | ICD-10-CM | POA: Insufficient documentation

## 2011-09-12 DIAGNOSIS — T1490XA Injury, unspecified, initial encounter: Secondary | ICD-10-CM | POA: Insufficient documentation

## 2011-09-12 DIAGNOSIS — M542 Cervicalgia: Secondary | ICD-10-CM | POA: Insufficient documentation

## 2011-09-12 MED ORDER — CYCLOBENZAPRINE HCL 10 MG PO TABS
10.0000 mg | ORAL_TABLET | Freq: Once | ORAL | Status: AC
  Administered 2011-09-12: 10 mg via ORAL
  Filled 2011-09-12: qty 1

## 2011-09-12 MED ORDER — CYCLOBENZAPRINE HCL 10 MG PO TABS: 10.0000 mg | ORAL_TABLET | Freq: Three times a day (TID) | ORAL | Status: AC | PRN

## 2011-09-12 MED ORDER — IBUPROFEN 800 MG PO TABS
800.0000 mg | ORAL_TABLET | Freq: Once | ORAL | Status: AC
  Administered 2011-09-12: 800 mg via ORAL
  Filled 2011-09-12: qty 1
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
## 2011-10-24 ENCOUNTER — Encounter (HOSPITAL_COMMUNITY): Payer: Self-pay | Admitting: Pharmacy Technician

## 2011-10-25 ENCOUNTER — Encounter (HOSPITAL_COMMUNITY)
Admission: RE | Admit: 2011-10-25 | Discharge: 2011-10-25 | Disposition: A | Discharge status: Home / Self Care | Payer: Medicaid Other | Source: Ambulatory Visit | Attending: General Surgery | Admitting: General Surgery

## 2011-10-25 ENCOUNTER — Encounter (HOSPITAL_COMMUNITY): Payer: Self-pay

## 2011-10-25 LAB — CBC WITH DIFFERENTIAL/PLATELET
Hemoglobin: 12.7 g/dL (ref 12.0–15.0)
Lymphocytes Relative: 37 % (ref 12–46)
Lymphs Abs: 2 10*3/uL (ref 0.7–4.0)
Monocytes Relative: 7 % (ref 3–12)
Neutro Abs: 2.9 10*3/uL (ref 1.7–7.7)
Neutrophils Relative %: 53 % (ref 43–77)
Platelets: 230 10*3/uL (ref 150–400)
RBC: 4.09 MIL/uL (ref 3.87–5.11)
WBC: 5.5 10*3/uL (ref 4.0–10.5)

## 2011-10-25 LAB — BASIC METABOLIC PANEL
Calcium: 9.5 mg/dL (ref 8.4–10.5)
GFR calc Af Amer: >90 mL/min (ref >90)
GFR calc non Af Amer: >90 mL/min (ref >90)
Glucose, Bld: 299 mg/dL — ABNORMAL HIGH (ref 70–99)
Potassium: 4.1 mEq/L (ref 3.5–5.1)
Sodium: 135 mEq/L (ref 135–145)

## 2011-10-25 LAB — SURGICAL PCR SCREEN: Staphylococcus aureus: NEGATIVE

## 2011-10-25 LAB — HCG, QUANTITATIVE, PREGNANCY: hCG, Beta Chain, Quant, S: <1 m[IU]/mL (ref <5)

## 2011-10-25 NOTE — Patient Instructions (Addendum)
    [place]  10/25/2011   Your procedure is scheduled on:  10/17/2011  Report to [facility] at  52  AM.  Call this number if you have problems the morning of surgery: [phone]   Remember:   Do not eat food:After Midnight.  May have clear liquids:until Midnight .   Take these medicines the morning of surgery with A SIP OF WATER:  none   Do not wear jewelry, make-up or nail polish.  Do not wear lotions, powders, or perfumes. You may wear deodorant.  Do not shave 48 hours prior to surgery. Men may shave face and neck.  Do not bring valuables to the hospital.  Contacts, dentures or bridgework may not be worn into surgery.  Leave suitcase in the car. After surgery it may be brought to your room.  For patients admitted to the hospital, checkout time is 11:00 AM the day of discharge.   Patients discharged the day of surgery will not be allowed to drive home.  Name and phone number of your driver: family  Special Instructions: CHG Shower Use Special Wash: 1/2 bottle night before surgery and 1/2 bottle morning of surgery.   Please read over the following fact sheets that you were given: Pain Booklet, MRSA Information, Surgical Site Infection Prevention, Anesthesia Post-op Instructions and Care and Recovery After Surgery  Epidermal Cyst An epidermal cyst is usually a small, painless lump under the skin. Cysts often occur on the face, neck, stomach, chest, or genitals. The cyst may be filled with a bad smelling paste. Do not pop your cyst. Popping the cyst can cause pain and puffiness (swelling). HOME CARE   Only take medicines as told by your doctor.   Take your medicine (antibiotics) as told. Finish it even if you start to feel better.  GET HELP RIGHT AWAY IF:  Your cyst is tender, red, or puffy.   You are not getting better, or you are getting worse.   You have any questions or concerns.  MAKE SURE YOU:  Understand these instructions.   Will watch your condition.    Will get help right away if you are not doing well or get worse.  Document Released: 03/23/2004 Document Revised: 02/02/2011 Document Reviewed: 08/22/2010 [facility] Patient Information 2012 [place].PATIENT INSTRUCTIONS POST-ANESTHESIA  IMMEDIATELY FOLLOWING SURGERY:  Do not drive or operate machinery for the first twenty four hours after surgery.  Do not make any important decisions for twenty four hours after surgery or while taking narcotic pain medications or sedatives.  If you develop intractable nausea and vomiting or a severe headache please notify your doctor immediately.  FOLLOW-UP:  Please make an appointment with your surgeon as instructed. You do not need to follow up with anesthesia unless specifically instructed to do so.  WOUND CARE INSTRUCTIONS (if applicable):  Keep a dry clean dressing on the anesthesia/puncture wound site if there is drainage.  Once the wound has quit draining you may leave it open to air.  Generally you should leave the bandage intact for twenty four hours unless there is drainage.  If the epidural site drains for more than 36-48 hours please call the anesthesia department.  QUESTIONS?:  Please feel free to call your physician or the hospital operator if you have any questions, and they will be happy to assist you.

## 2011-10-27 ENCOUNTER — Encounter (HOSPITAL_COMMUNITY): Payer: Self-pay | Admitting: Anesthesiology

## 2011-10-27 ENCOUNTER — Ambulatory Visit (HOSPITAL_COMMUNITY): Payer: Medicaid Other | Admitting: Anesthesiology

## 2011-10-27 ENCOUNTER — Ambulatory Visit (HOSPITAL_COMMUNITY)
Admission: RE | Admit: 2011-10-27 | Discharge: 2011-10-27 | Disposition: A | Discharge status: Home / Self Care | Payer: Medicaid Other | Source: Ambulatory Visit | Attending: General Surgery | Admitting: General Surgery

## 2011-10-27 ENCOUNTER — Encounter (HOSPITAL_COMMUNITY): Payer: Self-pay | Admitting: *Deleted

## 2011-10-27 ENCOUNTER — Encounter (HOSPITAL_COMMUNITY)
Admission: RE | Disposition: A | Discharge status: Home / Self Care | Payer: Self-pay | Source: Ambulatory Visit | Attending: General Surgery

## 2011-10-27 DIAGNOSIS — M7989 Other specified soft tissue disorders: Secondary | ICD-10-CM

## 2011-10-27 DIAGNOSIS — E119 Type 2 diabetes mellitus without complications: Secondary | ICD-10-CM | POA: Insufficient documentation

## 2011-10-27 DIAGNOSIS — Z01812 Encounter for preprocedural laboratory examination: Secondary | ICD-10-CM | POA: Insufficient documentation

## 2011-10-27 DIAGNOSIS — M799 Soft tissue disorder, unspecified: Secondary | ICD-10-CM | POA: Insufficient documentation

## 2011-10-27 DIAGNOSIS — L910 Hypertrophic scar: Secondary | ICD-10-CM | POA: Insufficient documentation

## 2011-10-27 HISTORY — PX: MASS EXCISION: SHX2000

## 2011-10-27 LAB — GLUCOSE, CAPILLARY: Glucose-Capillary: 274 mg/dL — ABNORMAL HIGH (ref 70–99)

## 2011-10-27 SURGERY — EXCISION MASS
Anesthesia: Monitor Anesthesia Care | Site: Back | Laterality: Right | Wound class: Clean

## 2011-10-27 MED ORDER — CEFAZOLIN SODIUM 1-5 GM-% IV SOLN
INTRAVENOUS | Status: AC
  Filled 2011-10-27: qty 100

## 2011-10-27 MED ORDER — FENTANYL CITRATE 0.05 MG/ML IJ SOLN
INTRAMUSCULAR | Status: AC
  Filled 2011-10-27: qty 2

## 2011-10-27 MED ORDER — MIDAZOLAM HCL 2 MG/2ML IJ SOLN
1.0000 mg | INTRAMUSCULAR | Status: DC | PRN
  Administered 2011-10-27 (×2): 2 mg via INTRAVENOUS

## 2011-10-27 MED ORDER — MIDAZOLAM HCL 2 MG/2ML IJ SOLN
INTRAMUSCULAR | Status: AC
  Filled 2011-10-27: qty 2

## 2011-10-27 MED ORDER — ENOXAPARIN SODIUM 40 MG/0.4ML ~~LOC~~ SOLN
40.0000 mg | Freq: Once | SUBCUTANEOUS | Status: AC
  Administered 2011-10-27: 40 mg via SUBCUTANEOUS

## 2011-10-27 MED ORDER — LIDOCAINE HCL (PF) 1 % IJ SOLN
INTRAMUSCULAR | Status: DC | PRN
  Administered 2011-10-27: 20 mL

## 2011-10-27 MED ORDER — ONDANSETRON HCL 4 MG/2ML IJ SOLN
4.0000 mg | Freq: Once | INTRAMUSCULAR | Status: AC
  Administered 2011-10-27: 4 mg via INTRAVENOUS

## 2011-10-27 MED ORDER — FENTANYL CITRATE 0.05 MG/ML IJ SOLN
INTRAMUSCULAR | Status: DC | PRN
  Administered 2011-10-27 (×2): 50 ug via INTRAVENOUS

## 2011-10-27 MED ORDER — MIDAZOLAM HCL 5 MG/5ML IJ SOLN
INTRAMUSCULAR | Status: DC | PRN
  Administered 2011-10-27: 2 mg via INTRAVENOUS

## 2011-10-27 MED ORDER — HYDROCODONE-ACETAMINOPHEN 5-325 MG PO TABS: 1.0000 | ORAL_TABLET | ORAL | Status: AC | PRN

## 2011-10-27 MED ORDER — FENTANYL CITRATE 0.05 MG/ML IJ SOLN
25.0000 ug | INTRAMUSCULAR | Status: DC | PRN
  Administered 2011-10-27 (×2): 25 ug via INTRAVENOUS

## 2011-10-27 MED ORDER — ONDANSETRON HCL 4 MG/2ML IJ SOLN
INTRAMUSCULAR | Status: AC
  Filled 2011-10-27: qty 2

## 2011-10-27 MED ORDER — PROPOFOL INFUSION 10 MG/ML OPTIME
INTRAVENOUS | Status: DC | PRN
  Administered 2011-10-27: 50 ug/kg/min via INTRAVENOUS

## 2011-10-27 MED ORDER — ONDANSETRON HCL 4 MG/2ML IJ SOLN: 4.0000 mg | Freq: Once | INTRAMUSCULAR | Status: DC | PRN

## 2011-10-27 MED ORDER — LIDOCAINE HCL (PF) 1 % IJ SOLN
INTRAMUSCULAR | Status: AC
  Filled 2011-10-27: qty 30

## 2011-10-27 MED ORDER — SODIUM CHLORIDE 0.9 % IR SOLN
Status: DC | PRN
  Administered 2011-10-27: 1000 mL

## 2011-10-27 MED ORDER — PROPOFOL 10 MG/ML IV EMUL
INTRAVENOUS | Status: AC
  Filled 2011-10-27: qty 20

## 2011-10-27 MED ORDER — LIDOCAINE HCL 1 % IJ SOLN
INTRAMUSCULAR | Status: DC | PRN
  Administered 2011-10-27: 20 mg via INTRADERMAL

## 2011-10-27 MED ORDER — CEFAZOLIN SODIUM-DEXTROSE 2-3 GM-% IV SOLR
2.0000 g | INTRAVENOUS | Status: AC
  Administered 2011-10-27: 2 g via INTRAVENOUS

## 2011-10-27 MED ORDER — LIDOCAINE HCL (PF) 1 % IJ SOLN
INTRAMUSCULAR | Status: AC
  Filled 2011-10-27: qty 5

## 2011-10-27 MED ORDER — ENOXAPARIN SODIUM 40 MG/0.4ML ~~LOC~~ SOLN
SUBCUTANEOUS | Status: AC
  Filled 2011-10-27: qty 0.4

## 2011-10-27 MED ORDER — CELECOXIB 100 MG PO CAPS: 400.0000 mg | ORAL_CAPSULE | Freq: Every day | ORAL | Status: DC

## 2011-10-27 MED ORDER — LACTATED RINGERS IV SOLN
INTRAVENOUS | Status: DC
  Administered 2011-10-27: 1000 mL via INTRAVENOUS

## 2011-10-27 SURGICAL SUPPLY — 37 items
APL SKNCLS STERI-STRIP NONHPOA (GAUZE/BANDAGES/DRESSINGS) ×1
BAG HAMPER (MISCELLANEOUS) ×2 IMPLANT
BENZOIN TINCTURE PRP APPL 2/3 (GAUZE/BANDAGES/DRESSINGS) ×2 IMPLANT
CLOTH BEACON ORANGE TIMEOUT ST (SAFETY) ×2 IMPLANT
CLSR STERI-STRIP ANTIMIC 1/2X4 (GAUZE/BANDAGES/DRESSINGS) ×1 IMPLANT
COVER LIGHT HANDLE STERIS (MISCELLANEOUS) ×4 IMPLANT
DURAPREP 26ML APPLICATOR (WOUND CARE) ×2 IMPLANT
ELECT NDL TIP 2.8 STRL (NEEDLE) IMPLANT
ELECT NEEDLE TIP 2.8 STRL (NEEDLE) IMPLANT
ELECT REM PT RETURN 9FT ADLT (ELECTROSURGICAL) ×2
ELECTRODE REM PT RTRN 9FT ADLT (ELECTROSURGICAL) ×1 IMPLANT
FORMALIN 10 PREFIL 120ML (MISCELLANEOUS) ×2 IMPLANT
GLOVE BIOGEL PI IND STRL 7.0 (GLOVE) IMPLANT
GLOVE BIOGEL PI IND STRL 7.5 (GLOVE) ×1 IMPLANT
GLOVE BIOGEL PI INDICATOR 7.0 (GLOVE) ×1
GLOVE BIOGEL PI INDICATOR 7.5 (GLOVE) ×1
GLOVE ECLIPSE 7.0 STRL STRAW (GLOVE) ×2 IMPLANT
GLOVE OPTIFIT SS 6.5 STRL BRWN (GLOVE) ×1 IMPLANT
GOWN STRL REIN XL XLG (GOWN DISPOSABLE) ×4 IMPLANT
KIT ROOM TURNOVER APOR (KITS) ×2 IMPLANT
MANIFOLD NEPTUNE II (INSTRUMENTS) ×2 IMPLANT
NDL HYPO 18GX1.5 BLUNT FILL (NEEDLE) IMPLANT
NDL HYPO 25X1 1.5 SAFETY (NEEDLE) ×1 IMPLANT
NEEDLE HYPO 18GX1.5 BLUNT FILL (NEEDLE) IMPLANT
NEEDLE HYPO 25X1 1.5 SAFETY (NEEDLE) ×2 IMPLANT
NS IRRIG 1000ML POUR BTL (IV SOLUTION) ×2 IMPLANT
PACK MINOR (CUSTOM PROCEDURE TRAY) ×2 IMPLANT
PAD ARMBOARD 7.5X6 YLW CONV (MISCELLANEOUS) ×2 IMPLANT
SET BASIN LINEN APH (SET/KITS/TRAYS/PACK) ×2 IMPLANT
SOL PREP PROV IODINE SCRUB 4OZ (MISCELLANEOUS) IMPLANT
STRIP CLOSURE SKIN 1/2X4 (GAUZE/BANDAGES/DRESSINGS) ×2 IMPLANT
SUT MNCRL AB 4-0 PS2 18 (SUTURE) ×2 IMPLANT
SUT PROLENE 3 0 PS 1 (SUTURE) ×1 IMPLANT
SUT VIC AB 3-0 SH 27 (SUTURE) ×2
SUT VIC AB 3-0 SH 27X BRD (SUTURE) IMPLANT
SYR BULB IRRIGATION 50ML (SYRINGE) ×2 IMPLANT
SYR CONTROL 10ML LL (SYRINGE) ×2 IMPLANT

## 2011-10-27 NOTE — Interval H&P Note (Signed)
History and Physical Interval Note:  10/27/2011 11:42 AM  Ana [name]  has presented today for surgery, with the diagnosis of Sebaceous Cyst of Shoulder  The various methods of treatment have been discussed with the patient and family. After consideration of risks, benefits and other options for treatment, the patient has consented to  Procedure(s) (LRB): EXCISION MASS (Right) as a surgical intervention .  The patient's history has been reviewed, patient examined, no change in status, stable for surgery.  I have reviewed the patient's chart and labs.  Questions were answered to the patient's satisfaction.     Theona Muhs C

## 2011-10-27 NOTE — Anesthesia Preprocedure Evaluation (Addendum)
Anesthesia Evaluation  Patient identified by MRN, date of birth, ID band Patient awake    Reviewed: Allergy & Precautions, H&P , NPO status , Patient's Chart, lab work & pertinent test results  History of Anesthesia Complications Negative for: history of anesthetic complications  Airway Mallampati: II      Dental  (+) Teeth Intact   Pulmonary neg pulmonary ROS,    Pulmonary exam normal       Cardiovascular Rhythm:Regular Rate:Normal     Neuro/Psych    GI/Hepatic GERD-  Medicated and Controlled,  Endo/Other  Well Controlled, Type 2  Renal/GU      Musculoskeletal   Abdominal   Peds  Hematology   Anesthesia Other Findings   Reproductive/Obstetrics                           Anesthesia Physical Anesthesia Plan  ASA: II  Anesthesia Plan: MAC   Post-op Pain Management:    Induction: Intravenous  Airway Management Planned: Simple Face Mask  Additional Equipment:   Intra-op Plan:   Post-operative Plan:   Informed Consent: I have reviewed the patients History and Physical, chart, labs and discussed the procedure including the risks, benefits and alternatives for the proposed anesthesia with the patient or authorized representative who has indicated his/her understanding and acceptance.     Plan Discussed with:   Anesthesia Plan Comments: (LLD position.)       Anesthesia Quick Evaluation

## 2011-10-27 NOTE — Transfer of Care (Signed)
Immediate Anesthesia Transfer of Care Note  [name]  Procedure(s) Performed: Procedure(s) (LRB): EXCISION MASS (Right)  Patient Location: PACU  Anesthesia Type: MAC  Level of Consciousness: awake, alert  and oriented  Airway & Oxygen Therapy: Patient Spontanous Breathing and Patient connected to face mask oxygen  Post-op Assessment: Report given to PACU RN  Post vital signs: Reviewed and stable  Complications: No apparent anesthesia complications

## 2011-10-27 NOTE — Op Note (Signed)
Patient:  Ana [name]  DOB:  1970/06/21  MRN:  [id number]   Preop Diagnosis:  Soft tissue mass of right posterior shoulder  Postop Diagnosis:  The same  Procedure:  Excision of soft tissue mass via a  6 cm incision  Surgeon:  Dr. Chelsea Primus  Anes:  MAC, 1% lidocaine for local  Indications:  Patient is a 41 year old female who presented my office with a nodule on her right posterior shoulder. Clinically this appears benign. Risks benefits alternatives of excision were discussed at length the patient. Her questions and concerns were addressed. Risks including but not limited to risk of bleeding, infection, recurrence were discussed with the patient as well as skin dehiscence. Her questions and concerns are addressed the patient was consented for the planned procedure.  Procedure note:  Patient is taken to the or was placed into a left lateral decubitus position. The MAC sedation was administered. At this point once patient was sedated her right posterior shoulder was prepped with DuraPrep solution and draped in standard fashion. A marking pen was utilized to [place] the planned elliptical incision around the lesion.  Local anesthetic was instilled. A 15 blade scalpel was utilized to create the elliptical incision. Additional dissection was carried out using needle-tip electrocautery. The stasis was obtained with electrocautery. Once the lesion was circumferentially dissected free it was placed in the back table sent as a permanent specimen to pathology. Hemostasis is excellent. The deep subcuticular tissues reapproximated using a 3-0 Vicryl in simple interrupted fashion. The skin edges were reapproximated using a 4-0 Monocryl in a running subcuticular suture. Additional 3-0 Prolene sutures were utilized to reinforce the closure in simple interrupted fashion. The incision was washed dried moist dry towel. Benzoin is applied around incision half-inch Steri-Strips are placed. The drapes removed  patient was allowed to come out of sedation was transferred to PACU in stable condition at the conclusion of the procedure all instrument, sponge, needle counts are correct. Patient tolerated procedure extremely well.  Complications:  None apparent  EBL:  Minimal  Specimen:  Soft tissue

## 2011-10-27 NOTE — Interval H&P Note (Signed)
History and Physical Interval Note:  10/27/2011 11:43 AM  Ana [name]  has presented today for surgery, with the diagnosis of Sebaceous Cyst of Shoulder  The various methods of treatment have been discussed with the patient and family. After consideration of risks, benefits and other options for treatment, the patient has consented to  Procedure(s) (LRB): EXCISION MASS (Right) as a surgical intervention .  The patient's history has been reviewed, patient examined, no change in status, stable for surgery.  I have reviewed the patient's chart and labs.  Questions were answered to the patient's satisfaction.     Mairin Lindsley C

## 2011-10-27 NOTE — H&P (Signed)
  NTS SOAP Note  Vital Signs:  Vitals as of: 123XX123: Systolic 123456: Diastolic 86: Heart Rate 81: Temp 97.90F: Height 33ft 0in: Weight 171Lbs 0 Ounces: Pain Level 8: BMI 33  BMI : 33.4 kg/m2  Subjective: This 41 Years 31 Months old Female presents for of bump on her right shoulder. Patient is noted this area for several years. Slowly increasing in size. Now causing some discomfort especially with pressure and closing. No drainage. No history trauma. No prior similar lesions in the past.  Review of Symptoms:  Constitutional:unremarkable   Head:unremarkable    :  pain Nose/Mouth/Throat:unremarkable Cardiovascular:  unremarkable   Respiratory:unremarkable   Gastrointestinal:  unremarkable   Genitourinary:unremarkable     Musculoskeletal:unremarkable   as per history of present illness Breast:unremarkable   Hematolgic/Lymphatic:unremarkable     Allergic/Immunologic:unremarkable     Past Medical History:  Obtained     Past Medical History  Pregnancy Gravida: 5 Pregnancy Para: 5 Surgical History: cholecystectomy, cesarean sections x4 Medical Problems: none Psychiatric History: none Allergies: no known drug allergies Medications: none   Social History:Obtained  Social History  Preferred Language: English ([place]) Race:  Black or African American Ethnicity: Not Hispanic / Latino Age: 41 Years 41 Months Marital Status:  M Alcohol: none Recreational drug(s): none   Smoking Status: Unknown if ever smoked  Family History:Obtained     Family History  Is there a family history [id number] mellitus    Objective Information: General:  Well appearing, well nourished in no distress.  obese Skin:     no rash or prominent lesions except for a approximate 2 cm area on her right posterior shoulder. This is superficial. Mobile. Nontender. Nonfluctuant. No discharge or drainage. No surrounding  erythema Head:Atraumatic; no masses; no abnormalities Eyes:  conjunctiva clear, EOM intact, PERRL Mouth:  Mucous membranes moist, no mucosal lesions. Neck:  Supple without lymphadenopathy.  Heart:  RRR, no murmur Lungs:    CTA bilaterally, no wheezes, rhonchi, rales.  Breathing unlabored. Abdomen:Soft, NT/ND, no HSM, no masses. Extremities:  No deformities, clubbing, cyanosis, or edema.   Assessment:    Plan: suspicious cyst of the right posterior shoulder. surgical options discussed. patient does wish to proceed. We'll schedule  this at her earliest convenience.  Patient Education:Alternative treatments to surgery were discussed with patient (and family).  Risks and benefits  of procedure were fully explained to the patient (and family) who gave informed consent. Patient/family questions were addressed.  Follow-up:Pending Surgery

## 2011-10-27 NOTE — Anesthesia Postprocedure Evaluation (Signed)
  Anesthesia Post-op Note  [name]  Procedure(s) Performed: Procedure(s) (LRB): EXCISION MASS (Right)  Patient Location: PACU  Anesthesia Type: MAC  Level of Consciousness: awake, alert  and oriented  Airway and Oxygen Therapy: Patient Spontanous Breathing and Patient connected to face mask oxygen  Post-op Pain: none  Post-op Assessment: Post-op Vital signs reviewed, Patient's Cardiovascular Status Stable, Respiratory Function Stable, Patent Airway and No signs of Nausea or vomiting  Post-op Vital Signs: Reviewed and stable  Complications: No apparent anesthesia complications

## 2011-11-01 ENCOUNTER — Encounter (HOSPITAL_COMMUNITY): Payer: Self-pay | Admitting: General Surgery

## 2013-01-05 ENCOUNTER — Encounter (HOSPITAL_COMMUNITY): Payer: Self-pay | Admitting: Emergency Medicine

## 2013-01-05 ENCOUNTER — Emergency Department (HOSPITAL_COMMUNITY)
Admission: EM | Admit: 2013-01-05 | Discharge: 2013-01-05 | Disposition: A | Discharge status: Home / Self Care | Payer: Medicaid Other | Attending: Emergency Medicine | Admitting: Emergency Medicine

## 2013-01-05 ENCOUNTER — Emergency Department (HOSPITAL_COMMUNITY): Payer: Medicaid Other

## 2013-01-05 DIAGNOSIS — R109 Unspecified abdominal pain: Secondary | ICD-10-CM

## 2013-01-05 DIAGNOSIS — R112 Nausea with vomiting, unspecified: Secondary | ICD-10-CM | POA: Insufficient documentation

## 2013-01-05 DIAGNOSIS — Z79899 Other long term (current) drug therapy: Secondary | ICD-10-CM | POA: Insufficient documentation

## 2013-01-05 DIAGNOSIS — R1033 Periumbilical pain: Secondary | ICD-10-CM | POA: Insufficient documentation

## 2013-01-05 DIAGNOSIS — E119 Type 2 diabetes mellitus without complications: Secondary | ICD-10-CM | POA: Insufficient documentation

## 2013-01-05 LAB — URINALYSIS, ROUTINE W REFLEX MICROSCOPIC
Leukocytes, UA: NEGATIVE
Nitrite: NEGATIVE
Specific Gravity, Urine: >1.03 — ABNORMAL HIGH (ref 1.005–1.030)
Urobilinogen, UA: 0.2 mg/dL (ref 0.0–1.0)

## 2013-01-05 LAB — COMPREHENSIVE METABOLIC PANEL
ALT: 42 U/L — ABNORMAL HIGH (ref 0–35)
BUN: 10 mg/dL (ref 6–23)
Calcium: 9.9 mg/dL (ref 8.4–10.5)
GFR calc Af Amer: >90 mL/min (ref >90)
Glucose, Bld: 249 mg/dL — ABNORMAL HIGH (ref 70–99)
Sodium: 132 mEq/L — ABNORMAL LOW (ref 135–145)
Total Protein: 7.8 g/dL (ref 6.0–8.3)

## 2013-01-05 LAB — RAPID URINE DRUG SCREEN, HOSP PERFORMED
Cocaine: NOT DETECTED
Opiates: NOT DETECTED

## 2013-01-05 LAB — URINE MICROSCOPIC-ADD ON

## 2013-01-05 LAB — LIPASE, BLOOD: Lipase: 34 U/L (ref 11–59)

## 2013-01-05 LAB — CBC WITH DIFFERENTIAL/PLATELET
Eosinophils Absolute: 0.1 10*3/uL (ref 0.0–0.7)
Eosinophils Relative: 2 % (ref 0–5)
Lymphs Abs: 1.8 10*3/uL (ref 0.7–4.0)
MCH: 31.6 pg (ref 26.0–34.0)
MCV: 90.1 fL (ref 78.0–100.0)
Platelets: 252 10*3/uL (ref 150–400)
RBC: 4.15 MIL/uL (ref 3.87–5.11)

## 2013-01-05 MED ORDER — HYDROMORPHONE HCL PF 1 MG/ML IJ SOLN
1.0000 mg | Freq: Once | INTRAMUSCULAR | Status: AC
  Administered 2013-01-05: 1 mg via INTRAVENOUS
  Filled 2013-01-05: qty 1

## 2013-01-05 MED ORDER — ONDANSETRON HCL 4 MG/2ML IJ SOLN
4.0000 mg | Freq: Once | INTRAMUSCULAR | Status: AC
  Administered 2013-01-05: 4 mg via INTRAVENOUS
  Filled 2013-01-05: qty 2

## 2013-01-05 MED ORDER — OXYCODONE-ACETAMINOPHEN 5-325 MG PO TABS: 1.0000 | ORAL_TABLET | ORAL | Status: DC | PRN

## 2013-01-05 MED ORDER — METFORMIN HCL 500 MG PO TABS: 500.0000 mg | ORAL_TABLET | Freq: Two times a day (BID) | ORAL | Status: DC

## 2013-01-05 NOTE — ED Notes (Signed)
Pt wanting to eat and drink prior to d/c per Dr. Eulis Foster. Called for a tray. Pt provided with po fluids.

## 2013-01-05 NOTE — ED Notes (Signed)
Pt request a food tray before she leaves. Meal ordered

## 2013-01-05 NOTE — ED Provider Notes (Signed)
CSN: [id number]     Arrival date & time 01/05/13  1057 History  This chart was scribed for Ana Blade, MD by Roxan Diesel, ED scribe.  This patient was seen in room APA05/APA05 and the patient's care was started at 12:00 PM.   Chief Complaint  Patient presents with  . Abdominal Pain    The history is provided by the patient. No language interpreter was used.    HPI Comments: Ana [name] is a 42 y.o. female who presents to the Emergency Department complaining of sharp severe periumbilical abdominal pain that began this morning with associated nausea and one episode of emesis.  Pt also states her stomach was swollen this morning but this subsided spontaneously.  No treatments attempted pta.  She denies diarrhea, fever, CP, SOB, or urinary symptoms.  She is on her period which began yesterday.  Last BM was yesterday and was normal.  Pt does not take any medications regularly.  She does not smoke and drinks a small amount of alcohol occasionally.  She denies illicit drug use.     Past Medical History  Diagnosis Date  . Diabetes mellitus     diet controlled    Past Surgical History  Procedure Laterality Date  . Cesarean section  x4  . Cholecystectomy    . Tubal ligation    . Mass excision  10/27/2011    Procedure: EXCISION MASS;  Surgeon: Donato Heinz, MD;  Location: [facility];  Service: General;  Laterality: Right;    Family History  Problem Relation Age of Onset  . Kidney failure Mother   . Diabetes Mother   . Kidney failure Father   . Kidney failure Brother   . Diabetes Brother     History  Substance Use Topics  . Smoking status: Never Smoker   . Smokeless tobacco: Never Used  . Alcohol Use: No    OB History   Grav Para Term Preterm Abortions TAB SAB Ect Mult Living   6 5  5 1  1          Review of Systems  Constitutional: Negative for fever.  Respiratory: Negative for shortness of breath.   Cardiovascular: Negative for chest pain.  Gastrointestinal:  Positive for nausea, vomiting and abdominal pain. Negative for diarrhea and constipation.  Genitourinary: Negative for dysuria, hematuria, decreased urine volume and difficulty urinating.  All other systems reviewed and are negative.     Allergies  Ranitidine hcl  Home Medications   Current Outpatient Rx  Name  Route  Sig  Dispense  Refill  . ibuprofen (ADVIL,MOTRIN) 200 MG tablet   Oral   Take 200 mg by mouth every 6 (six) hours as needed.         . metFORMIN (GLUCOPHAGE) 500 MG tablet   Oral   Take 1 tablet (500 mg total) by mouth 2 (two) times daily with a meal.   60 tablet   0   . oxyCODONE-acetaminophen (PERCOCET) 5-325 MG per tablet   Oral   Take 1 tablet by mouth every 4 (four) hours as needed for severe pain.   10 tablet   0    BP 164/84  Pulse 92  Temp(Src) 97.6 F (36.4 C)  Ht 5\' 2"  (1.575 m)  Wt 164 lb (74.39 kg)  BMI 29.99 kg/m2  SpO2 99%  LMP 01/05/2013  Physical Exam  Nursing note and vitals reviewed. Constitutional: She is oriented to person, place, and time. She appears well-developed and well-nourished.  HENT:  Head: Normocephalic and atraumatic.  Eyes: Conjunctivae and EOM are normal. Pupils are equal, round, and reactive to light.  Neck: Normal range of motion and phonation normal. Neck supple.  Cardiovascular: Normal rate, regular rhythm and intact distal pulses.   Pulmonary/Chest: Effort normal and breath sounds normal. No respiratory distress. She has no wheezes. She has no rales. She exhibits no tenderness.  Abdominal: Soft. Bowel sounds are normal. She exhibits no distension. There is tenderness. There is no guarding.  Left mid and upper quadrant tenderness, mild  Genitourinary:  No CVA tenderness  Musculoskeletal: Normal range of motion.  Neurological: She is alert and oriented to person, place, and time. She exhibits normal muscle tone.  Skin: Skin is warm and dry.  Psychiatric: She has a normal mood and affect. Her behavior is  normal. Judgment and thought content normal.    ED Course  Procedures (including critical care time)  Patient Vitals for the past 24 hrs:  BP Temp Pulse SpO2 Height Weight  01/05/13 1108 164/84 mmHg 97.6 F (36.4 C) 92 99 % 5\' 2"  (1.575 m) 164 lb (74.39 kg)    DIAGNOSTIC STUDIES: Oxygen Saturation is 99% on room air, normal by my interpretation.    COORDINATION OF CARE: 12:06 PM-Discussed treatment plan which includes pain medication, anti-emetics, imaging and labs with pt at bedside and pt agreed to plan.   2:23 PM Reevaluation with update and discussion. After initial assessment and treatment, an updated evaluation reveals she feels better. She is hungry and thirsty. She now states that she is supposed to be on metformin, and insulin. Ana [name] L    Labs Review Labs Reviewed  COMPREHENSIVE METABOLIC PANEL - Abnormal; Notable for the following:    Sodium 132 (*)    Glucose, Bld 249 (*)    Creatinine, Ser 0.48 (*)    AST 46 (*)    ALT 42 (*)    All other components within normal limits  URINALYSIS, ROUTINE W REFLEX MICROSCOPIC - Abnormal; Notable for the following:    Specific Gravity, Urine >1.030 (*)    Glucose, UA 500 (*)    Hgb urine dipstick SMALL (*)    Ketones, ur 40 (*)    All other components within normal limits  URINE MICROSCOPIC-ADD ON - Abnormal; Notable for the following:    Squamous Epithelial / LPF FEW (*)    All other components within normal limits  CBC WITH DIFFERENTIAL  LIPASE, BLOOD  URINE RAPID DRUG SCREEN (HOSP PERFORMED)  ETHANOL    Imaging Review Dg Abd Acute W/chest  01/05/2013   CLINICAL DATA:  Umbilical pain 1 day.  EXAM: ACUTE ABDOMEN SERIES (ABDOMEN 2 VIEW & CHEST 1 VIEW)  COMPARISON:  09/12/2011  FINDINGS: Lungs are clear. Cardiomediastinal silhouette and remainder of the chest is unchanged.  Bowel gas pattern is nonobstructive. There are surgical clips over the right upper quadrant. There is no free peritoneal air. Remainder of the  exam is unchanged.  IMPRESSION: Negative abdominal radiographs.  No acute cardiopulmonary disease.   Electronically Signed   By: Marin Olp M.D.   On: 01/05/2013 13:16    EKG Interpretation   None       MDM   1. Abdominal pain of unknown etiology   2. DM type 2 (diabetes mellitus, type 2)      Abdominal pain, with hyperglycemia and medication noncompliance. She's not ketotic and there is no overt sign of infection. She's improved and stable for discharge.   Nursing Notes  Reviewed/ Care Coordinated Applicable Imaging Reviewed Interpretation of Laboratory Data incorporated into ED treatment      I personally performed the services described in this documentation, which was scribed in my presence. The recorded information has been reviewed and is accurate.     Ana Blade, MD 01/05/13 [phone]

## 2013-01-05 NOTE — ED Notes (Signed)
Pt c/o abd pain, vomiting X1 that started this am, denies any diarrhea, fever.

## 2013-01-05 NOTE — ED Notes (Signed)
Pt medicated for pain. In and out catheter specimen collected and set to lab

## 2013-01-05 NOTE — ED Notes (Signed)
Pt currently denies nausea. States sharp pain around umbilicus. Pt is currently on her menses. States she vomited once at 1000 when she first woke up. Only complaint at this times is discomfort to abdomen.

## 2013-01-07 ENCOUNTER — Ambulatory Visit (INDEPENDENT_AMBULATORY_CARE_PROVIDER_SITE_OTHER): Payer: Medicaid Other | Admitting: Family Medicine

## 2013-01-07 ENCOUNTER — Encounter: Payer: Self-pay | Admitting: Family Medicine

## 2013-01-07 VITALS — BP 164/92 | HR 88 | Temp 97.8°F | Resp 20 | Ht 59.5 in | Wt 166.2 lb

## 2013-01-07 DIAGNOSIS — R109 Unspecified abdominal pain: Secondary | ICD-10-CM

## 2013-01-07 LAB — CBC WITH DIFFERENTIAL/PLATELET
Basophils Absolute: 0 10*3/uL (ref 0.0–0.1)
Eosinophils Absolute: 0.1 10*3/uL (ref 0.0–0.7)
Lymphocytes Relative: 29 % (ref 12–46)
Lymphs Abs: 1.5 10*3/uL (ref 0.7–4.0)
Neutrophils Relative %: 62 % (ref 43–77)
Platelets: 247 10*3/uL (ref 150–400)
RBC: 4.13 MIL/uL (ref 3.87–5.11)
RDW: 13.1 % (ref 11.5–15.5)
WBC: 5.4 10*3/uL (ref 4.0–10.5)

## 2013-01-07 LAB — POCT URINALYSIS DIPSTICK
Bilirubin, UA: NEGATIVE
Blood, UA: NEGATIVE
Nitrite, UA: NEGATIVE
Spec Grav, UA: >=1.03
pH, UA: 6

## 2013-01-07 LAB — COMPREHENSIVE METABOLIC PANEL
ALT: 36 U/L — ABNORMAL HIGH (ref 0–35)
AST: 39 U/L — ABNORMAL HIGH (ref 0–37)
CO2: 26 mEq/L (ref 19–32)
Sodium: 135 mEq/L (ref 135–145)
Total Bilirubin: 0.4 mg/dL (ref 0.3–1.2)
Total Protein: 6.6 g/dL (ref 6.0–8.3)

## 2013-01-07 LAB — POCT URINE PREGNANCY: Preg Test, Ur: NEGATIVE

## 2013-01-07 NOTE — Patient Instructions (Signed)
Do not take any more Ibuprofen or naproxen.  Abdominal Pain Abdominal pain can be caused by many things. Your caregiver decides the seriousness of your pain by an examination and possibly blood tests and X-rays. Many cases can be observed and treated at home. Most abdominal pain is not caused by a disease and will probably improve without treatment. However, in many cases, more time must pass before a clear cause of the pain can be found. Before that point, it may not be known if you need more testing, or if hospitalization or surgery is needed. HOME CARE INSTRUCTIONS   Do not take laxatives unless directed by your caregiver.  Take pain medicine only as directed by your caregiver.  Only take over-the-counter or prescription medicines for pain, discomfort, or fever as directed by your caregiver.  Try a clear liquid diet (broth, tea, or water) for as long as directed by your caregiver. Slowly move to a bland diet as tolerated. SEEK IMMEDIATE MEDICAL CARE IF:   The pain does not go away.  You have a fever.  You keep throwing up (vomiting).  The pain is felt only in portions of the abdomen. Pain in the right side could possibly be appendicitis. In an adult, pain in the left lower portion of the abdomen could be colitis or diverticulitis.  You pass bloody or black tarry stools. MAKE SURE YOU:   Understand these instructions.  Will watch your condition.  Will get help right away if you are not doing well or get worse. Document Released: 11/23/2004 Document Revised: 05/08/2011 Document Reviewed: 10/02/2007 [facility] Patient Information 2014 [place].

## 2013-01-07 NOTE — Progress Notes (Signed)
  Subjective:    Patient ID: Ana [name], female    DOB: 02-Mar-1970, 42 y.o.   MRN: [id number]  HPI Pt here with abd discomfort, generalized. Intermittant for a few weeks. No vomiting but she has had nausea. No diarrhea or constipation.     Review of Systems 12 point ros neg except as per hpi     Objective:   Physical Exam  General:   alert, cooperative and appears stated age  Gait:   normal  Skin:   normal  Oral cavity:   lips, mucosa, and tongue normal; teeth and gums normal  Eyes:   sclerae white, pupils equal and reactive, red reflex normal bilaterally  Ears:   normal bilaterally  Neck:   normal  Lungs:  clear to auscultation bilaterally  Heart:   regular rate and rhythm, S1, S2 normal, no murmur, click, rub or gallop  Abdomen:  soft, non-tender; bowel sounds normal; no masses,  no organomegaly  GU:  normal female  Extremities:   extremities normal, atraumatic, no cyanosis or edema  Neuro:  normal without focal findings, mental status, speech normal, alert and oriented x3, PERLA and reflexes normal and symmetric            Assessment & Plan:  Abdominal pain, unspecified site - Plan: CBC with Differential, Comprehensive metabolic panel, CT Abdomen Pelvis W Contrast, POCT urine pregnancy, POCT urinalysis dipstick, Urine culture, Helicobacter pylori abs-IgG+IgA, bld, CANCELED: H. pylori Screen, CANCELED: Helicobacter pylori abs-IgG+IgA, bld

## 2013-01-08 ENCOUNTER — Emergency Department (HOSPITAL_COMMUNITY): Payer: Medicaid Other

## 2013-01-08 ENCOUNTER — Telehealth: Payer: Self-pay | Admitting: Family Medicine

## 2013-01-08 ENCOUNTER — Emergency Department (HOSPITAL_COMMUNITY)
Admission: EM | Admit: 2013-01-08 | Discharge: 2013-01-08 | Disposition: A | Discharge status: Home / Self Care | Payer: Medicaid Other | Attending: Emergency Medicine | Admitting: Emergency Medicine

## 2013-01-08 ENCOUNTER — Encounter (HOSPITAL_COMMUNITY): Payer: Self-pay | Admitting: Emergency Medicine

## 2013-01-08 ENCOUNTER — Other Ambulatory Visit: Payer: Self-pay | Admitting: Family Medicine

## 2013-01-08 DIAGNOSIS — Z9089 Acquired absence of other organs: Secondary | ICD-10-CM | POA: Insufficient documentation

## 2013-01-08 DIAGNOSIS — Z79899 Other long term (current) drug therapy: Secondary | ICD-10-CM | POA: Insufficient documentation

## 2013-01-08 DIAGNOSIS — Z3202 Encounter for pregnancy test, result negative: Secondary | ICD-10-CM | POA: Insufficient documentation

## 2013-01-08 DIAGNOSIS — Z90710 Acquired absence of both cervix and uterus: Secondary | ICD-10-CM | POA: Insufficient documentation

## 2013-01-08 DIAGNOSIS — N390 Urinary tract infection, site not specified: Secondary | ICD-10-CM

## 2013-01-08 DIAGNOSIS — A048 Other specified bacterial intestinal infections: Secondary | ICD-10-CM

## 2013-01-08 DIAGNOSIS — Z9889 Other specified postprocedural states: Secondary | ICD-10-CM | POA: Insufficient documentation

## 2013-01-08 DIAGNOSIS — E119 Type 2 diabetes mellitus without complications: Secondary | ICD-10-CM | POA: Insufficient documentation

## 2013-01-08 DIAGNOSIS — R0602 Shortness of breath: Secondary | ICD-10-CM | POA: Insufficient documentation

## 2013-01-08 LAB — URINALYSIS, ROUTINE W REFLEX MICROSCOPIC
Nitrite: POSITIVE — AB
Specific Gravity, Urine: 1.02 (ref 1.005–1.030)
Urobilinogen, UA: 0.2 mg/dL (ref 0.0–1.0)

## 2013-01-08 LAB — POCT PREGNANCY, URINE: Preg Test, Ur: NEGATIVE

## 2013-01-08 LAB — HELICOBACTER PYLORI ABS-IGG+IGA, BLD
H Pylori IgG: 2.42 {ISR} — ABNORMAL HIGH
HELICOBACTER PYLORI AB, IGA: 22.9 U/mL — ABNORMAL HIGH (ref <9.0)

## 2013-01-08 LAB — URINE MICROSCOPIC-ADD ON

## 2013-01-08 MED ORDER — AMOXICILLIN 500 MG PO CAPS: 1000.0000 mg | ORAL_CAPSULE | Freq: Two times a day (BID) | ORAL | Status: DC

## 2013-01-08 MED ORDER — AMOXICILL-CLARITHRO-LANSOPRAZ PO MISC: Freq: Two times a day (BID) | ORAL | Status: DC

## 2013-01-08 MED ORDER — HYDROCODONE-ACETAMINOPHEN 5-325 MG PO TABS
1.0000 | ORAL_TABLET | Freq: Once | ORAL | Status: AC
  Administered 2013-01-08: 1 via ORAL
  Filled 2013-01-08: qty 1

## 2013-01-08 MED ORDER — LIDOCAINE HCL (PF) 1 % IJ SOLN
INTRAMUSCULAR | Status: AC
  Administered 2013-01-08: 2 mL
  Filled 2013-01-08: qty 5

## 2013-01-08 MED ORDER — CLARITHROMYCIN 500 MG PO TABS: 500.0000 mg | ORAL_TABLET | Freq: Two times a day (BID) | ORAL | Status: DC

## 2013-01-08 MED ORDER — CEFTRIAXONE SODIUM 1 G IJ SOLR
1.0000 g | Freq: Once | INTRAMUSCULAR | Status: AC
  Administered 2013-01-08: 1 g via INTRAMUSCULAR
  Filled 2013-01-08: qty 10

## 2013-01-08 MED ORDER — OMEPRAZOLE 20 MG PO CPDR: 20.0000 mg | DELAYED_RELEASE_CAPSULE | Freq: Two times a day (BID) | ORAL | Status: DC

## 2013-01-08 NOTE — Progress Notes (Signed)
ED/CM noted patient did not have health insurance and/or PCP listed in the computer.  Patient was given the [facility] with information on the clinics, food pantries, and the handout for new health insurance sign-up.  Patient expressed appreciation for this.

## 2013-01-08 NOTE — Telephone Encounter (Signed)
Saw in Epic that pt in ED at [facility] currently. Called and spoke with charge nurse Bethena Roys, advised her that pt's h pylori pos. She will relay to attending physician as may play a role in pts sx today. Told Bethena Roys that i have called pt in prevpac. Our office will be letting pt know about this result and the medicine as well.

## 2013-01-08 NOTE — ED Provider Notes (Addendum)
CSN: [id number]     Arrival date & time 01/08/13  1254 History  This chart was scribed for Orpah Greek, * by Elby Beck, ED Scribe. This patient was seen in room APA19/APA19 and the patient's care was started at 1:20 PM.   Chief Complaint  Patient presents with  . Flank Pain    The history is provided by the patient. No language interpreter was used.    HPI Comments: Ana [name] is a 42 y.o. female who presents to the Emergency Department complaining of constant, moderate left flank/rib cage pain onset at 4:30 AM this morning. She also reports associated mild SOB. She states that she was seen here 3 days ago for abdominal pain, and states that today's pain is new and different. She states that she was told 3 days ago that her blood sugar was elevated. She states that she has noticed some urinary frequency lately. She denies cough, fever, nausea, emesis, diarrhea, constipation, dysuria or any other symptoms.   Past Medical History  Diagnosis Date  . Diabetes mellitus     diet controlled   Past Surgical History  Procedure Laterality Date  . Cesarean section  x4  . Cholecystectomy    . Tubal ligation    . Mass excision  10/27/2011    Procedure: EXCISION MASS;  Surgeon: Donato Heinz, MD;  Location: [facility];  Service: General;  Laterality: Right;   Family History  Problem Relation Age of Onset  . Kidney failure Mother   . Diabetes Mother   . Kidney failure Father   . Kidney failure Brother   . Diabetes Brother    History  Substance Use Topics  . Smoking status: Never Smoker   . Smokeless tobacco: Never Used  . Alcohol Use: No   OB History   Grav Para Term Preterm Abortions TAB SAB Ect Mult Living   6 5  5 1  1         Review of Systems  Constitutional: Negative for fever.  Respiratory: Negative for cough.   Gastrointestinal: Positive for abdominal pain (subsided). Negative for nausea, vomiting, diarrhea and constipation.  Genitourinary: Positive for  frequency and flank pain (left). Negative for dysuria.  Musculoskeletal:       Left ribcage pain  All other systems reviewed and are negative.    Allergies  Ranitidine hcl  Home Medications   Current Outpatient Rx  Name  Route  Sig  Dispense  Refill  . metFORMIN (GLUCOPHAGE) 500 MG tablet   Oral   Take 1 tablet (500 mg total) by mouth 2 (two) times daily with a meal.   60 tablet   0   . oxyCODONE-acetaminophen (PERCOCET) 5-325 MG per tablet   Oral   Take 1 tablet by mouth every 4 (four) hours as needed for severe pain.   10 tablet   0   . ibuprofen (ADVIL,MOTRIN) 200 MG tablet   Oral   Take 200 mg by mouth every 6 (six) hours as needed.          Trtage Vitals: BP 133/63  Pulse 94  Temp(Src) 98 F (36.7 C) (Oral)  Resp 20  Ht 5\' 3"  (1.6 m)  Wt 166 lb (75.297 kg)  BMI 29.41 kg/m2  SpO2 100%  LMP 01/05/2013  Physical Exam  Constitutional: She is oriented to person, place, and time. She appears well-developed and well-nourished. No distress.  HENT:  Head: Normocephalic and atraumatic.  Right Ear: Hearing normal.  Left Ear: Hearing  normal.  Nose: Nose normal.  Mouth/Throat: Oropharynx is clear and moist and mucous membranes are normal.  Eyes: Conjunctivae and EOM are normal. Pupils are equal, round, and reactive to light.  Neck: Normal range of motion. Neck supple.  Cardiovascular: Regular rhythm, S1 normal and S2 normal.  Exam reveals no gallop and no friction rub.   No murmur heard. Pulmonary/Chest: Effort normal and breath sounds normal. No respiratory distress. She exhibits no tenderness.  Abdominal: Soft. Normal appearance and bowel sounds are normal. There is no hepatosplenomegaly. There is no tenderness. There is no rebound, no guarding, no tenderness at McBurney's point and negative Murphy's sign. No hernia.  Musculoskeletal: Normal range of motion.  Tenderness over left rib cage area.  Neurological: She is alert and oriented to person, place, and time.  She has normal strength. No cranial nerve deficit or sensory deficit. Coordination normal. GCS eye subscore is 4. GCS verbal subscore is 5. GCS motor subscore is 6.  Skin: Skin is warm, dry and intact. No rash noted. No cyanosis.  Psychiatric: She has a normal mood and affect. Her speech is normal and behavior is normal. Thought content normal.    ED Course  Procedures (including critical care time)  DIAGNOSTIC STUDIES: Oxygen Saturation is 100% on RA, normal by my interpretation.    COORDINATION OF CARE: 1:25 PM- discussed plan to obtain UA and a CT of pt's abdomen. Pt advised of plan for treatment and pt agrees.  Labs Review Labs Reviewed  URINALYSIS, ROUTINE W REFLEX MICROSCOPIC - Abnormal; Notable for the following:    APPearance CLOUDY (*)    Glucose, UA >1000 (*)    Hgb urine dipstick LARGE (*)    Ketones, ur TRACE (*)    Protein, ur TRACE (*)    Nitrite POSITIVE (*)    Leukocytes, UA TRACE (*)    All other components within normal limits  URINE MICROSCOPIC-ADD ON - Abnormal; Notable for the following:    Squamous Epithelial / LPF FEW (*)    Bacteria, UA MANY (*)    All other components within normal limits  URINE CULTURE  POCT PREGNANCY, URINE   Imaging Review Ct Abdomen Pelvis Wo Contrast  01/08/2013   CLINICAL DATA:  Left flank pain.  EXAM: CT ABDOMEN AND PELVIS WITHOUT CONTRAST  TECHNIQUE: Multidetector CT imaging of the abdomen and pelvis was performed following the standard protocol without intravenous contrast.  COMPARISON:  None.  FINDINGS: Liver normal. Spleen normal. , pancreas normal. Cholecystectomy. No biliary distention.  Adrenals normal. No focal renal abnormality. No hydronephrosis or hydroureter. Innumerable pelvic phleboliths. Nonobstructing distal ureteral stone cannot be completely excluded. The bladder is nondistended. Uterus and adnexa are unremarkable. No free pelvic fluid.  No adenopathy.  Aorta normal caliber.  Appendix normal. No bowel distention or  free air. Stomach is nondistended.  Heart size normal. Lung bases clear. Degenerative change throughout the lumbar spine. No acute osseous abnormality.  IMPRESSION: No significant abnormality identified. Innumerable pelvic phleboliths are present. A nonobstructing distal ureteral stone cannot be entirely excluded however the findings are most consistent with phleboliths. There is no hydronephrosis or hydroureter.   Electronically Signed   By: Marcello Moores  Register   On: 01/08/2013 15:19    EKG Interpretation   None       MDM  Diagnosis: 1. Left flank pain/abdominal pain 2. H. Pylori 3. UTI  Patient seen in the ED 3 days ago for abdominal pain. She followup with her doctor yesterday and had repeat blood  work performed. Patient in the left upper abdomen and flank region. Labs were reviewed from previous visit. No significant abnormality seen, the need for repeat. Patient complaining of pain in the area of the left lower ribs and left upper abdomen. X-ray of abdomen and chest from 3 days ago did not show any acute abnormality, no repeat necessary. CT scan performed without contrast of abdomen and pelvis without any acute abnormality seen. Urinanalysis today positive, likely the cause of the patient's pain. H. pylori antibody testing sent by primary care doctor yesterday has returned positive. Patient will be treated for H. pylori infection followup with primary doctor.  I personally performed the services described in this documentation, which was scribed in my presence. The recorded information has been reviewed and is accurate.    Orpah Greek, MD 01/08/13 [place], MD 01/08/13 [phone]

## 2013-01-08 NOTE — ED Notes (Signed)
Pt here for abd pain Sunday but states the pain to left flank is different and new, started today. Denies urinary changes. Denies n/v/d. States pain to left lower back also.

## 2013-01-09 ENCOUNTER — Telehealth: Payer: Self-pay | Admitting: *Deleted

## 2013-01-09 NOTE — Telephone Encounter (Signed)
Message copied by [facility], Louis Meckel on Thu Jan 09, 2013  9:47 AM ------      Message from: Doran Heater      Created: Wed Jan 08, 2013  3:21 PM       Please let pt know that her h pylori came back positive. im sending in medicine for this. This is likely the reason she has had abdominal pain recently. Thanks AW ------

## 2013-01-09 NOTE — Telephone Encounter (Signed)
Nurse called and spoke with pt, she stated that she went to ED yesterday and was aware of lab results. She stated that they gave her medication to start. Pt appreciative of call.

## 2013-01-10 LAB — URINE CULTURE

## 2013-01-12 ENCOUNTER — Telehealth (HOSPITAL_COMMUNITY): Payer: Self-pay | Admitting: Emergency Medicine

## 2013-01-12 NOTE — ED Notes (Signed)
Post ED Visit - Positive Culture Follow-up  Culture report reviewed by antimicrobial stewardship pharmacist: []  Wes Fulton, Pharm.D., BCPS []  Heide Guile, Pharm.D., BCPS []  Alycia Rossetti, Pharm.D., BCPS []  [place], [place].D., BCPS, AAHIVP []  Legrand Como, Pharm.D., BCPS, AAHIVP [x]  Anette Guarneri, Pharm.D., BCPS  Positive urine culture Treated with Amoxicillin, organism sensitive to the same and no further patient follow-up is required at this time.  Ana [name] 01/12/2013, 1:18 PM

## 2013-01-14 ENCOUNTER — Ambulatory Visit (INDEPENDENT_AMBULATORY_CARE_PROVIDER_SITE_OTHER): Payer: Medicaid Other | Admitting: Family Medicine

## 2013-01-14 ENCOUNTER — Ambulatory Visit (HOSPITAL_COMMUNITY): Payer: Medicaid Other

## 2013-01-14 VITALS — BP 122/84 | HR 80 | Temp 98.6°F | Wt 169.8 lb

## 2013-01-14 DIAGNOSIS — R1012 Left upper quadrant pain: Secondary | ICD-10-CM

## 2013-01-14 NOTE — Progress Notes (Signed)
  Subjective:    Patient ID: Ana [name], female    DOB: Aug 09, 1970, 42 y.o.   MRN: [id number]  HPI Abdominal Pain: Patient complains of abdominal pain. The pain is described as aching, and is 4/10 in intensity. Pain is located in the LUQ without radiation. Onset was 3 weeks ago. Symptoms have been unchanged since. Aggravating factors: none.  Alleviating factors: none. Associated symptoms: none. The patient denies constipation, diarrhea, dysuria, fever, frequency and headache.  She was seen in the Ed last week and diagnosed and treated for UTI.  She was seen by me last week and found t be H pyloro pos. She is underoging tx for this.     Review of Systems per hpi     Objective:   Physical Exam  Nursing note and vitals reviewed. Constitutional: She is oriented to person, place, and time. She appears well-developed and well-nourished.  HENT:  Right Ear: External ear normal.  Left Ear: External ear normal.  Nose: Nose normal.  Mouth/Throat: Oropharynx is clear and moist. No oropharyngeal exudate.  Eyes: Conjunctivae are normal. Pupils are equal, round, and reactive to light.  Neck: Normal range of motion. Neck supple. No thyromegaly present.  Cardiovascular: Normal rate, regular rhythm and normal heart sounds.   Pulmonary/Chest: Effort normal and breath sounds normal.  Abdominal: Soft. Bowel sounds are normal. She exhibits no distension. There is no tenderness. There is no rebound. She c/o the pain in the lateral lUQ although there is no ttp.  Lymphadenopathy:    She has no cervical adenopathy.  Neurological: She is alert and oriented to person, place, and time. She has normal reflexes.  Skin: Skin is warm and dry.  Psychiatric: She has a normal mood and affect. Her behavior is normal.        Assessment & Plan:  abd pain - she'll complete her uti and h pylori meds and let us know if sx do not resolve. Spoke with radiology today (Dr. Thornton Papas). If abd pain persists will need ct w  contrast.

## 2013-12-29 ENCOUNTER — Encounter (HOSPITAL_COMMUNITY): Payer: Self-pay | Admitting: Emergency Medicine

## 2014-04-03 ENCOUNTER — Emergency Department (HOSPITAL_COMMUNITY)
Admission: EM | Admit: 2014-04-03 | Discharge: 2014-04-03 | Discharge status: Against Medical Advice | Payer: Medicaid Other | Attending: Emergency Medicine | Admitting: Emergency Medicine

## 2014-04-03 ENCOUNTER — Encounter (HOSPITAL_COMMUNITY): Payer: Self-pay | Admitting: *Deleted

## 2014-04-03 DIAGNOSIS — Y9389 Activity, other specified: Secondary | ICD-10-CM | POA: Insufficient documentation

## 2014-04-03 DIAGNOSIS — Y998 Other external cause status: Secondary | ICD-10-CM | POA: Diagnosis not present

## 2014-04-03 DIAGNOSIS — S4990XA Unspecified injury of shoulder and upper arm, unspecified arm, initial encounter: Secondary | ICD-10-CM | POA: Insufficient documentation

## 2014-04-03 DIAGNOSIS — W010XXA Fall on same level from slipping, tripping and stumbling without subsequent striking against object, initial encounter: Secondary | ICD-10-CM | POA: Insufficient documentation

## 2014-04-03 DIAGNOSIS — E119 Type 2 diabetes mellitus without complications: Secondary | ICD-10-CM | POA: Diagnosis not present

## 2014-04-03 DIAGNOSIS — S6990XA Unspecified injury of unspecified wrist, hand and finger(s), initial encounter: Secondary | ICD-10-CM | POA: Insufficient documentation

## 2014-04-03 DIAGNOSIS — Y9289 Other specified places as the place of occurrence of the external cause: Secondary | ICD-10-CM | POA: Diagnosis not present

## 2014-04-03 LAB — CBG MONITORING, ED: Glucose-Capillary: 312 mg/dL — ABNORMAL HIGH (ref 70–99)

## 2014-04-03 NOTE — ED Notes (Signed)
Pt tripped and fell at home. States pain to entire arm and wrist. NAD.

## 2014-04-03 NOTE — ED Notes (Signed)
CBG of 312. States she has been off insulin since 2009 and has not followed up with a  Doctor since then.

## 2014-07-24 ENCOUNTER — Emergency Department (HOSPITAL_COMMUNITY)
Admission: EM | Admit: 2014-07-24 | Discharge: 2014-07-24 | Disposition: A | Discharge status: Home / Self Care | Payer: Medicaid Other | Attending: Emergency Medicine | Admitting: Emergency Medicine

## 2014-07-24 ENCOUNTER — Encounter (HOSPITAL_COMMUNITY): Payer: Self-pay | Admitting: *Deleted

## 2014-07-24 DIAGNOSIS — E1165 Type 2 diabetes mellitus with hyperglycemia: Secondary | ICD-10-CM | POA: Insufficient documentation

## 2014-07-24 DIAGNOSIS — M79672 Pain in left foot: Secondary | ICD-10-CM | POA: Diagnosis not present

## 2014-07-24 DIAGNOSIS — Z79899 Other long term (current) drug therapy: Secondary | ICD-10-CM | POA: Diagnosis not present

## 2014-07-24 DIAGNOSIS — Z792 Long term (current) use of antibiotics: Secondary | ICD-10-CM | POA: Insufficient documentation

## 2014-07-24 DIAGNOSIS — M79671 Pain in right foot: Secondary | ICD-10-CM

## 2014-07-24 DIAGNOSIS — R7309 Other abnormal glucose: Secondary | ICD-10-CM

## 2014-07-24 DIAGNOSIS — M7989 Other specified soft tissue disorders: Secondary | ICD-10-CM | POA: Diagnosis present

## 2014-07-24 LAB — URINALYSIS, ROUTINE W REFLEX MICROSCOPIC
BILIRUBIN URINE: NEGATIVE
Glucose, UA: >1000 mg/dL — AB
HGB URINE DIPSTICK: NEGATIVE
Ketones, ur: NEGATIVE mg/dL
Leukocytes, UA: NEGATIVE
NITRITE: NEGATIVE
PH: 5 (ref 5.0–8.0)
Protein, ur: NEGATIVE mg/dL
Specific Gravity, Urine: 1.02 (ref 1.005–1.030)
UROBILINOGEN UA: 1 mg/dL (ref 0.0–1.0)

## 2014-07-24 LAB — BASIC METABOLIC PANEL
ANION GAP: 9 (ref 5–15)
BUN: 9 mg/dL (ref 6–20)
CO2: 22 mmol/L (ref 22–32)
Calcium: 8.8 mg/dL — ABNORMAL LOW (ref 8.9–10.3)
Chloride: 101 mmol/L (ref 101–111)
Creatinine, Ser: 0.52 mg/dL (ref 0.44–1.00)
GFR calc non Af Amer: >60 mL/min (ref >60)
Glucose, Bld: 265 mg/dL — ABNORMAL HIGH (ref 65–99)
Potassium: 3.9 mmol/L (ref 3.5–5.1)
Sodium: 132 mmol/L — ABNORMAL LOW (ref 135–145)

## 2014-07-24 LAB — URINE MICROSCOPIC-ADD ON

## 2014-07-24 MED ORDER — TRAMADOL HCL 50 MG PO TABS: 50.0000 mg | ORAL_TABLET | Freq: Four times a day (QID) | ORAL | Status: DC | PRN

## 2014-07-24 MED ORDER — METFORMIN HCL 500 MG PO TABS: 500.0000 mg | ORAL_TABLET | Freq: Two times a day (BID) | ORAL | Status: DC

## 2014-07-24 MED ORDER — ACETAMINOPHEN 325 MG PO TABS
650.0000 mg | ORAL_TABLET | Freq: Once | ORAL | Status: AC
  Administered 2014-07-24: 650 mg via ORAL
  Filled 2014-07-24: qty 2

## 2014-07-24 MED ORDER — OXYCODONE-ACETAMINOPHEN 5-325 MG PO TABS
1.0000 | ORAL_TABLET | Freq: Once | ORAL | Status: AC
  Administered 2014-07-24: 1 via ORAL
  Filled 2014-07-24: qty 1

## 2014-07-24 NOTE — ED Notes (Signed)
Pt made aware to return if symptoms worsen or if any life threatening symptoms occur.   

## 2014-07-24 NOTE — Discharge Instructions (Signed)
It is important that you follow up with a primary care doctor as soon as possible for management of your diabetes. Your blood glucose was elevated today 265.

## 2014-07-24 NOTE — ED Notes (Signed)
Pt co bilateral feet swelling and pain x 2 weeks.

## 2014-07-24 NOTE — ED Provider Notes (Signed)
CSN: [id number]     Arrival date & time 07/24/14  1523 History   First MD Initiated Contact with Patient 07/24/14 1607     Chief Complaint  Patient presents with  . feet swelling      (Consider location/radiation/quality/duration/timing/severity/associated sxs/prior Treatment) HPI Ana [name] is a 44 y.o. female who presents to the ED with bilateral foot swelling x 2 weeks. Patient reports that both feet hurt and are swollen when she gets up in the morning and continue to swell during the day. Today she went to the school for field day for her son and was out in the heat all day and her feet starting hurting and swelling. She states she does not drink water. She drinks soda. She has not taken anything for pain.   Past Medical History  Diagnosis Date  . Diabetes mellitus     diet controlled   Past Surgical History  Procedure Laterality Date  . Cesarean section  x4  . Cholecystectomy    . Tubal ligation    . Mass excision  10/27/2011    Procedure: EXCISION MASS;  Surgeon: Donato Heinz, MD;  Location: [facility];  Service: General;  Laterality: Right;   Family History  Problem Relation Age of Onset  . Kidney failure Mother   . Diabetes Mother   . Kidney failure Father   . Kidney failure Brother   . Diabetes Brother    History  Substance Use Topics  . Smoking status: Never Smoker   . Smokeless tobacco: Never Used  . Alcohol Use: No   OB History    Gravida Para Term Preterm AB TAB SAB Ectopic Multiple Living   6 5  5 1  1         Review of Systems All systems negative except as stated in HPI   Allergies  Ranitidine hcl  Home Medications   Prior to Admission medications   Medication Sig Start Date End Date Taking? Authorizing Provider  amoxicillin (AMOXIL) 500 MG capsule Take 2 capsules (1,000 mg total) by mouth 2 (two) times daily. 01/08/13   Orpah Greek, MD  amoxicillin-clarithromycin-lansoprazole [facility]) combo pack Take by mouth 2 (two) times daily.  Follow package directions. 01/08/13 01/22/13  Doran Heater, MD  clarithromycin (BIAXIN) 500 MG tablet Take 1 tablet (500 mg total) by mouth 2 (two) times daily. 01/08/13   Orpah Greek, MD  ibuprofen (ADVIL,MOTRIN) 200 MG tablet Take 200 mg by mouth every 6 (six) hours as needed.    Historical Provider, MD  metFORMIN (GLUCOPHAGE) 500 MG tablet Take 1 tablet (500 mg total) by mouth 2 (two) times daily with a meal. 07/24/14   Kymir Coles Bunnie Pion, NP  omeprazole (PRILOSEC) 20 MG capsule Take 1 capsule (20 mg total) by mouth 2 (two) times daily before a meal. 01/08/13   Orpah Greek, MD  oxyCODONE-acetaminophen (PERCOCET) 5-325 MG per tablet Take 1 tablet by mouth every 4 (four) hours as needed for severe pain. 01/05/13   Daleen Bo, MD  traMADol (ULTRAM) 50 MG tablet Take 1 tablet (50 mg total) by mouth every 6 (six) hours as needed. 07/24/14   Danaija Eskridge Bunnie Pion, NP   BP 155/88 mmHg  Pulse 89  Temp(Src) 97.6 F (36.4 C) (Oral)  Resp 18  Ht 5\' 5"  (1.651 m)  Wt 168 lb (76.204 kg)  BMI 27.96 kg/m2  SpO2 99%  LMP 07/03/2014 Physical Exam  Constitutional: She is oriented to person, place, and time. She appears  well-developed and well-nourished.  HENT:  Head: Normocephalic.  Eyes: EOM are normal.  Neck: Neck supple.  Cardiovascular: Normal rate.   Pulmonary/Chest: Effort normal.  Abdominal: Soft. There is no tenderness.  Musculoskeletal: Normal range of motion.  Toes are tender on palpation bilateral. Pedal pulses equal, adequate circulation, good touch sensation. No edema appreciated at this time.   Neurological: She is alert and oriented to person, place, and time. No cranial nerve deficit.  Skin: Skin is warm and dry.  Psychiatric: She has a normal mood and affect. Her behavior is normal.  Nursing note and vitals reviewed.   ED Course  Procedures (including critical care time) Labs Review Results for orders placed or performed during the hospital encounter of 07/24/14 (from  the past 24 hour(s))  Urinalysis, Routine w reflex microscopic     Status: Abnormal   Collection Time: 07/24/14  4:55 PM  Result Value Ref Range   Color, Urine YELLOW YELLOW   APPearance CLEAR CLEAR   Specific Gravity, Urine 1.020 1.005 - 1.030   pH 5.0 5.0 - 8.0   Glucose, UA >1000 (A) NEGATIVE mg/dL   Hgb urine dipstick NEGATIVE NEGATIVE   Bilirubin Urine NEGATIVE NEGATIVE   Ketones, ur NEGATIVE NEGATIVE mg/dL   Protein, ur NEGATIVE NEGATIVE mg/dL   Urobilinogen, UA 1.0 0.0 - 1.0 mg/dL   Nitrite NEGATIVE NEGATIVE   Leukocytes, UA NEGATIVE NEGATIVE  Urine microscopic-add on     Status: None   Collection Time: 07/24/14  4:55 PM  Result Value Ref Range   Squamous Epithelial / LPF RARE RARE   WBC, UA 0-2 <3 WBC/hpf   Bacteria, UA RARE RARE  Basic metabolic panel     Status: Abnormal   Collection Time: 07/24/14  5:40 PM  Result Value Ref Range   Sodium 132 (L) 135 - 145 mmol/L   Potassium 3.9 3.5 - 5.1 mmol/L   Chloride 101 101 - 111 mmol/L   CO2 22 22 - 32 mmol/L   Glucose, Bld 265 (H) 65 - 99 mg/dL   BUN 9 6 - 20 mg/dL   Creatinine, Ser 0.52 0.44 - 1.00 mg/dL   Calcium 8.8 (L) 8.9 - 10.3 mg/dL   GFR calc non Af Amer >60 >60 mL/min   GFR calc Af Amer >60 >60 mL/min   Anion gap 9 5 - 15    NOTE: patient with hx of Diabetes and was on Insulin. Patient's PCP was Dr. Cleta Alberts and when he left his practice patient did not follow up with anyone else. She has not been on her insulin on any type of diabetic management in over 3 years. Will start Metformin and she will call for an appointment with a PCP on Monday.   MDM  44 y.o. female with bilateral foot pain and hx of swelling of feet. Stable for d/c without visible swelling at this time. Will treat for possible diabetic neuropathy and she will follow up with a PCP ASAP. Discussed with the patient in detail complications associated with untreated diabetes. She voices understanding. All questioned fully answered.   Final diagnoses:  Foot  pain, bilateral  Elevated glucose      [facility], NP 07/24/14 [place], MD 07/24/14 [phone]

## 2014-10-25 ENCOUNTER — Emergency Department (HOSPITAL_COMMUNITY)
Admission: EM | Admit: 2014-10-25 | Discharge: 2014-10-26 | Disposition: A | Discharge status: Home / Self Care | Payer: Medicaid Other | Attending: Physician Assistant | Admitting: Physician Assistant

## 2014-10-25 ENCOUNTER — Emergency Department (HOSPITAL_COMMUNITY): Payer: Medicaid Other

## 2014-10-25 ENCOUNTER — Encounter (HOSPITAL_COMMUNITY): Payer: Self-pay | Admitting: *Deleted

## 2014-10-25 DIAGNOSIS — N12 Tubulo-interstitial nephritis, not specified as acute or chronic: Secondary | ICD-10-CM | POA: Diagnosis not present

## 2014-10-25 DIAGNOSIS — R197 Diarrhea, unspecified: Secondary | ICD-10-CM | POA: Insufficient documentation

## 2014-10-25 DIAGNOSIS — R079 Chest pain, unspecified: Secondary | ICD-10-CM | POA: Diagnosis not present

## 2014-10-25 DIAGNOSIS — Z792 Long term (current) use of antibiotics: Secondary | ICD-10-CM | POA: Diagnosis not present

## 2014-10-25 DIAGNOSIS — Z3202 Encounter for pregnancy test, result negative: Secondary | ICD-10-CM | POA: Insufficient documentation

## 2014-10-25 DIAGNOSIS — E119 Type 2 diabetes mellitus without complications: Secondary | ICD-10-CM | POA: Insufficient documentation

## 2014-10-25 DIAGNOSIS — Z79899 Other long term (current) drug therapy: Secondary | ICD-10-CM | POA: Diagnosis not present

## 2014-10-25 DIAGNOSIS — R109 Unspecified abdominal pain: Secondary | ICD-10-CM | POA: Diagnosis present

## 2014-10-25 LAB — COMPREHENSIVE METABOLIC PANEL
ALBUMIN: 4.1 g/dL (ref 3.5–5.0)
ALT: 38 U/L (ref 14–54)
AST: 50 U/L — ABNORMAL HIGH (ref 15–41)
Alkaline Phosphatase: 78 U/L (ref 38–126)
Anion gap: 11 (ref 5–15)
BUN: 6 mg/dL (ref 6–20)
CHLORIDE: 102 mmol/L (ref 101–111)
CO2: 24 mmol/L (ref 22–32)
CREATININE: 0.66 mg/dL (ref 0.44–1.00)
Calcium: 9.2 mg/dL (ref 8.9–10.3)
GFR calc non Af Amer: >60 mL/min (ref >60)
Glucose, Bld: 218 mg/dL — ABNORMAL HIGH (ref 65–99)
Potassium: 3.8 mmol/L (ref 3.5–5.1)
SODIUM: 137 mmol/L (ref 135–145)
Total Bilirubin: 0.6 mg/dL (ref 0.3–1.2)
Total Protein: 8 g/dL (ref 6.5–8.1)

## 2014-10-25 LAB — URINE MICROSCOPIC-ADD ON

## 2014-10-25 LAB — URINALYSIS, ROUTINE W REFLEX MICROSCOPIC
Bilirubin Urine: NEGATIVE
Glucose, UA: 250 mg/dL — AB
Ketones, ur: NEGATIVE mg/dL
NITRITE: NEGATIVE
PH: 6 (ref 5.0–8.0)
Specific Gravity, Urine: <1.005 — ABNORMAL LOW (ref 1.005–1.030)
Urobilinogen, UA: 0.2 mg/dL (ref 0.0–1.0)

## 2014-10-25 LAB — CBC WITH DIFFERENTIAL/PLATELET
BASOS PCT: 0 % (ref 0–1)
Basophils Absolute: 0 10*3/uL (ref 0.0–0.1)
EOS ABS: 0.1 10*3/uL (ref 0.0–0.7)
EOS PCT: 2 % (ref 0–5)
HCT: 36.7 % (ref 36.0–46.0)
Hemoglobin: 12.8 g/dL (ref 12.0–15.0)
Lymphocytes Relative: 24 % (ref 12–46)
Lymphs Abs: 2.1 10*3/uL (ref 0.7–4.0)
MCH: 32.7 pg (ref 26.0–34.0)
MCHC: 34.9 g/dL (ref 30.0–36.0)
MCV: 93.9 fL (ref 78.0–100.0)
Monocytes Absolute: 0.8 10*3/uL (ref 0.1–1.0)
Monocytes Relative: 9 % (ref 3–12)
Neutro Abs: 5.7 10*3/uL (ref 1.7–7.7)
Neutrophils Relative %: 65 % (ref 43–77)
Platelets: 206 10*3/uL (ref 150–400)
RBC: 3.91 MIL/uL (ref 3.87–5.11)
RDW: 12.9 % (ref 11.5–15.5)
WBC: 8.8 10*3/uL (ref 4.0–10.5)

## 2014-10-25 LAB — D-DIMER, QUANTITATIVE (NOT AT ARMC): D DIMER QUANT: 0.59 ug{FEU}/mL — AB (ref 0.00–0.48)

## 2014-10-25 LAB — LIPASE, BLOOD: Lipase: 12 U/L — ABNORMAL LOW (ref 22–51)

## 2014-10-25 LAB — TROPONIN I: Troponin I: <0.03 ng/mL (ref <0.031)

## 2014-10-25 LAB — POC URINE PREG, ED: Preg Test, Ur: NEGATIVE

## 2014-10-25 MED ORDER — HYDROMORPHONE HCL 1 MG/ML IJ SOLN
1.0000 mg | Freq: Once | INTRAMUSCULAR | Status: AC
  Administered 2014-10-25: 1 mg via INTRAVENOUS
  Filled 2014-10-25: qty 1

## 2014-10-25 MED ORDER — SODIUM CHLORIDE 0.9 % IV BOLUS (SEPSIS)
1000.0000 mL | Freq: Once | INTRAVENOUS | Status: AC
  Administered 2014-10-25: 1000 mL via INTRAVENOUS

## 2014-10-25 MED ORDER — ONDANSETRON HCL 4 MG/2ML IJ SOLN
4.0000 mg | Freq: Once | INTRAMUSCULAR | Status: AC
  Administered 2014-10-25: 4 mg via INTRAVENOUS
  Filled 2014-10-25: qty 2

## 2014-10-25 MED ORDER — MORPHINE SULFATE (PF) 4 MG/ML IV SOLN
4.0000 mg | Freq: Once | INTRAVENOUS | Status: AC
  Administered 2014-10-25: 4 mg via INTRAVENOUS
  Filled 2014-10-25: qty 1

## 2014-10-25 NOTE — ED Notes (Signed)
Pt reporting pain in LUQ, radiating into left side area.  Reports some nausea, vomited this morning.  Reporting diarrhea yesterday.

## 2014-10-25 NOTE — ED Provider Notes (Signed)
CSN: [id number]     Arrival date & time 10/25/14  2147 History   First MD Initiated Contact with Patient 10/25/14 2218     Chief Complaint  Patient presents with  . Abdominal Pain     (Consider location/radiation/quality/duration/timing/severity/associated sxs/prior Treatment) HPI   Ana [name] is a 44 y.o. female who presents to the Emergency Department complaining of sudden onset of the left upper chest and flank pain.  She states that pain began approximately 30 minutes prior to arrival.  She describes the pain as sharp and stabbing in nature and began while sitting on her porch.  She states the pain feels like it is under her left breast and radiates to her side.  Pain is worse with movement and deep breathing.  She states she vomited x 1 earlier today secondary to her pain and had some mild diarrhea yesterday.  She denies shortness of breath, diaphoresis, arm pain and dysuria.  She has not tried any medications for her symptoms.   Past Medical History  Diagnosis Date  . Diabetes mellitus     diet controlled   Past Surgical History  Procedure Laterality Date  . Cesarean section  x4  . Cholecystectomy    . Tubal ligation    . Mass excision  10/27/2011    Procedure: EXCISION MASS;  Surgeon: Donato Heinz, MD;  Location: [facility];  Service: General;  Laterality: Right;   Family History  Problem Relation Age of Onset  . Kidney failure Mother   . Diabetes Mother   . Kidney failure Father   . Kidney failure Brother   . Diabetes Brother    Social History  Substance Use Topics  . Smoking status: Never Smoker   . Smokeless tobacco: Never Used  . Alcohol Use: No   OB History    Gravida Para Term Preterm AB TAB SAB Ectopic Multiple Living   6 5  5 1  1         Review of Systems  Constitutional: Negative for fever, chills and appetite change.  Respiratory: Negative for chest tightness and shortness of breath.   Cardiovascular: Positive for chest pain.  Gastrointestinal:  Positive for nausea, vomiting and diarrhea. Negative for abdominal pain and blood in stool.  Genitourinary: Negative for dysuria, flank pain, decreased urine volume and difficulty urinating.  Musculoskeletal: Negative for back pain.  Skin: Negative for color change and rash.  Neurological: Negative for dizziness, weakness and numbness.  Hematological: Negative for adenopathy.  All other systems reviewed and are negative.     Allergies  Ranitidine hcl  Home Medications   Prior to Admission medications   Medication Sig Start Date End Date Taking? Authorizing Provider  gabapentin (NEURONTIN) 300 MG capsule Take 300 mg by mouth daily as needed. 09/03/14  Yes Historical Provider, MD  metFORMIN (GLUCOPHAGE) 500 MG tablet Take 1 tablet (500 mg total) by mouth 2 (two) times daily with a meal. 07/24/14  Yes Hope Bunnie Pion, NP  amoxicillin (AMOXIL) 500 MG capsule Take 2 capsules (1,000 mg total) by mouth 2 (two) times daily. 01/08/13   Orpah Greek, MD  amoxicillin-clarithromycin-lansoprazole [facility]) combo pack Take by mouth 2 (two) times daily. Follow package directions. 01/08/13 01/22/13  Doran Heater, MD  clarithromycin (BIAXIN) 500 MG tablet Take 1 tablet (500 mg total) by mouth 2 (two) times daily. 01/08/13   Orpah Greek, MD  omeprazole (PRILOSEC) 20 MG capsule Take 1 capsule (20 mg total) by mouth 2 (two) times  daily before a meal. 01/08/13   Orpah Greek, MD  oxyCODONE-acetaminophen (PERCOCET) 5-325 MG per tablet Take 1 tablet by mouth every 4 (four) hours as needed for severe pain. 01/05/13   Daleen Bo, MD  traMADol (ULTRAM) 50 MG tablet Take 1 tablet (50 mg total) by mouth every 6 (six) hours as needed. 07/24/14   Hope Bunnie Pion, NP   BP 142/122 mmHg  Pulse 100  Temp(Src) 98.9 F (37.2 C) (Oral)  Resp 20  Ht 5\' 2"  (1.575 m)  Wt 163 lb (73.936 kg)  BMI 29.81 kg/m2  SpO2 100%  LMP 10/04/2014 Physical Exam  Constitutional: She is oriented to person, place,  and time. She appears well-developed and well-nourished.  Patient appears uncomfortable and tearful  HENT:  Mouth/Throat: Oropharynx is clear and moist.  Neck: Normal range of motion. Neck supple.  Cardiovascular: Normal rate, regular rhythm, normal heart sounds and intact distal pulses.   No murmur heard. Pulmonary/Chest: Effort normal and breath sounds normal. No respiratory distress. She exhibits tenderness.  Patient with pain to palpation left anterior chest and flank.    Abdominal: Soft. Normal appearance. She exhibits no distension. There is no tenderness. There is CVA tenderness. There is no rebound and no guarding.  Musculoskeletal: Normal range of motion.  Neurological: She is alert and oriented to person, place, and time. No cranial nerve deficit.  Skin: Skin is warm.  Nursing note and vitals reviewed.   ED Course  Procedures (including critical care time) Labs Review Labs Reviewed  COMPREHENSIVE METABOLIC PANEL - Abnormal; Notable for the following:    Glucose, Bld 218 (*)    AST 50 (*)    All other components within normal limits  LIPASE, BLOOD - Abnormal; Notable for the following:    Lipase 12 (*)    All other components within normal limits  URINALYSIS, ROUTINE W REFLEX MICROSCOPIC (NOT AT [facility]) - Abnormal; Notable for the following:    APPearance HAZY (*)    Specific Gravity, Urine <1.005 (*)    Glucose, UA 250 (*)    Hgb urine dipstick LARGE (*)    Protein, ur TRACE (*)    Leukocytes, UA MODERATE (*)    All other components within normal limits  D-DIMER, QUANTITATIVE (NOT AT [facility]) - Abnormal; Notable for the following:    D-Dimer, Quant 0.59 (*)    All other components within normal limits  URINE MICROSCOPIC-ADD ON - Abnormal; Notable for the following:    Bacteria, UA MANY (*)    All other components within normal limits  URINE CULTURE  CBC WITH DIFFERENTIAL/PLATELET  TROPONIN I  POC URINE PREG, ED    Imaging Review Dg Chest 2 View  10/25/2014    CLINICAL DATA:  Severe left-sided rib pain, onset 2 days ago.  EXAM: CHEST  2 VIEW  COMPARISON:  01/05/2013  FINDINGS: The heart size and mediastinal contours are within normal limits. Both lungs are clear. The visualized skeletal structures are unremarkable.  IMPRESSION: No active cardiopulmonary disease.   Electronically Signed   By: Andreas Newport M.D.   On: 10/25/2014 23:25   Ct Angio Chest Pe W/cm &/or Wo Cm  10/26/2014   CLINICAL DATA:  Left upper quadrant pain  EXAM: CT ANGIOGRAPHY CHEST WITH CONTRAST  TECHNIQUE: Multidetector CT imaging of the chest was performed using the standard protocol during bolus administration of intravenous contrast. Multiplanar CT image reconstructions and MIPs were obtained to evaluate the vascular anatomy.  CONTRAST:  158mL OMNIPAQUE IOHEXOL 350  MG/ML SOLN  COMPARISON:  None.  FINDINGS: Cardiovascular: There is good opacification of the pulmonary arteries. There is no pulmonary embolism. The thoracic aorta is normal in caliber and intact.  Lungs: Clear  Central airways: Patent  Effusions: None  Lymphadenopathy: None  Esophagus: Unremarkable  Upper abdomen: No significant abnormality  Musculoskeletal: No significant abnormality  Review of the MIP images confirms the above findings.  IMPRESSION: Negative for pulmonary embolism.  No significant abnormality.   Electronically Signed   By: Andreas Newport M.D.   On: 10/26/2014 01:35   I have personally reviewed and evaluated these images and lab results as part of my medical decision-making.   EKG Interpretation   Date/Time:  Sunday October 25 2014 22:49:21 EDT Ventricular Rate:  101 PR Interval:  157 QRS Duration: 83 QT Interval:  349 QTC Calculation: 452 R Axis:   93 Text Interpretation:  Sinus tachycardia Borderline right axis deviation ST  elev, probable normal early repol pattern No significant change since last  tracing no acute ischemia. Confirmed by Gerald Leitz ([id number]) on  10/25/2014 10:54:46 PM        Urine culture pending  MDM   Final diagnoses:  Pyelonephritis   Pt is feeling better.  Has been resting comfortably after IV medications.  Ambulated to the restroom w/o difficulty.    75  Spoke with radiology, Dr. Delphina Cahill who confirmed that CT angio is normal.  Pt does appear to have a UTI and considering flank pain with vomiting, sx's are likely related to pyelonephritis. She is feeling better, no continued vomiting and remains afebrile.  She appears stable for d/c.  I Will tx with Keflex and short course of pain medication.  She appears stable for d/c and agrees to close f/u with her PMD.  strict return precautions also given and pt agrees to [place]less[place], PA-C 10/27/14 [place], MD 10/29/14 1455

## 2014-10-25 NOTE — ED Notes (Signed)
Pt c/o severe left side rib pain that started x 2 days ago; pt is tearful during assessment

## 2014-10-26 MED ORDER — HYDROCODONE-ACETAMINOPHEN 5-325 MG PO TABS: ORAL_TABLET | ORAL | Status: DC

## 2014-10-26 MED ORDER — CEPHALEXIN 500 MG PO CAPS
500.0000 mg | ORAL_CAPSULE | Freq: Once | ORAL | Status: AC
  Administered 2014-10-26: 500 mg via ORAL
  Filled 2014-10-26: qty 1

## 2014-10-26 MED ORDER — CEPHALEXIN 500 MG PO CAPS: 500.0000 mg | ORAL_CAPSULE | Freq: Four times a day (QID) | ORAL | Status: DC

## 2014-10-26 MED ORDER — IOHEXOL 350 MG/ML SOLN
100.0000 mL | Freq: Once | INTRAVENOUS | Status: AC | PRN
  Administered 2014-10-26: 100 mL via INTRAVENOUS

## 2014-10-26 NOTE — Discharge Instructions (Signed)
Pyelonephritis, Adult °Pyelonephritis is a kidney infection. A kidney infection can happen quickly, or it can last for a long time. °HOME CARE  °· Take your medicine (antibiotics) as told. Finish it even if you start to feel better. °· Keep all doctor visits as told. °· Drink enough fluids to keep your pee (urine) clear or pale yellow. °· Only take medicine as told by your doctor. °GET HELP RIGHT AWAY IF:  °· You have a fever or lasting symptoms for more than 2-3 days. °· You have a fever and your symptoms suddenly get worse. °· You cannot take your medicine or drink fluids as told. °· You have chills and shaking. °· You feel very weak or pass out (faint). °· You do not feel better after 2 days. °MAKE SURE YOU: °· Understand these instructions. °· Will watch your condition. °· Will get help right away if you are not doing well or get worse. °Document Released: 03/23/2004 Document Revised: 08/15/2011 Document Reviewed: 08/03/2010 °ExitCare® Patient Information ©2015 [employer]. This information is not intended to replace advice given to you by your health care provider. Make sure you discuss any questions you have with your health care provider. ° °

## 2014-10-28 LAB — URINE CULTURE: Culture: >=100000

## 2014-10-29 ENCOUNTER — Telehealth (HOSPITAL_BASED_OUTPATIENT_CLINIC_OR_DEPARTMENT_OTHER): Payer: Self-pay

## 2014-10-29 ENCOUNTER — Telehealth (HOSPITAL_COMMUNITY): Payer: Self-pay

## 2014-10-29 NOTE — Telephone Encounter (Signed)
Post ED Visit - Positive Culture Follow-up: Successful Patient Follow-Up  Culture assessed and recommendations reviewed by: []  Heide Guile, Pharm.D., BCPS-AQ ID []  Alycia Rossetti, Pharm.D., BCPS []  [place], Pharm.D., BCPS, AAHIVP []  Legrand Como, Pharm.D., BCPS, AAHIVP []  Tegan Magsam, Pharm.D. [x]  Cassie Nicole Kindred, [place].D.  Positive urine culture  []  Patient discharged without antimicrobial prescription and treatment is now indicated [x]  Organism is resistant to prescribed ED discharge antimicrobial []  Patient with positive blood cultures  Changes discussed with ED provider: Clayton Bibles PA New antibiotic prescription Cipro 500mg  po bid x 5 days Called to [employer], [place] C 10/29/2014, 11:00 AM

## 2014-10-29 NOTE — Progress Notes (Signed)
ED Antimicrobial Stewardship Positive Culture Follow Up   Ana [name] is an 44 y.o. female who presented to [facility] on 10/25/2014 with a chief complaint of  Chief Complaint  Patient presents with  . Abdominal Pain    Recent Results (from the past 720 hour(s))  Urine culture     Status: None   Collection Time: 10/25/14 10:35 PM  Result Value Ref Range Status   Specimen Description URINE, CLEAN CATCH  Final   Special Requests NONE  Final   Culture   Final    >=100,000 COLONIES/mL ENTEROBACTER AEROGENES Performed at [facility]    Report Status 10/28/2014 FINAL  Final   Organism ID, Bacteria ENTEROBACTER AEROGENES  Final      Susceptibility   Enterobacter aerogenes - MIC*    CEFAZOLIN >=64 RESISTANT Resistant     CEFTRIAXONE <=1 SENSITIVE Sensitive     CIPROFLOXACIN <=0.25 SENSITIVE Sensitive     GENTAMICIN <=1 SENSITIVE Sensitive     IMIPENEM 0.5 SENSITIVE Sensitive     NITROFURANTOIN 64 INTERMEDIATE Intermediate     TRIMETH/SULFA <=20 SENSITIVE Sensitive     PIP/TAZO <=4 SENSITIVE Sensitive     * >=100,000 COLONIES/mL ENTEROBACTER AEROGENES    [x]  Treated with Cephalexin, organism resistant to prescribed antimicrobial  New antibiotic prescription: Ciprofloxacin 500 mg PO BID x 5 days  ED Provider: Clayton Bibles, PA   Cassie L. Stewart 10/29/2014, 10:14 AM Infectious Diseases Pharmacist Phone# [phone]

## 2015-07-19 ENCOUNTER — Emergency Department (HOSPITAL_COMMUNITY): Payer: Medicaid Other

## 2015-07-19 ENCOUNTER — Emergency Department (HOSPITAL_COMMUNITY)
Admission: EM | Admit: 2015-07-19 | Discharge: 2015-07-19 | Disposition: A | Discharge status: Home / Self Care | Payer: Medicaid Other | Attending: Emergency Medicine | Admitting: Emergency Medicine

## 2015-07-19 ENCOUNTER — Encounter (HOSPITAL_COMMUNITY): Payer: Self-pay | Admitting: Emergency Medicine

## 2015-07-19 DIAGNOSIS — E119 Type 2 diabetes mellitus without complications: Secondary | ICD-10-CM | POA: Insufficient documentation

## 2015-07-19 DIAGNOSIS — M79671 Pain in right foot: Secondary | ICD-10-CM

## 2015-07-19 DIAGNOSIS — Z7984 Long term (current) use of oral hypoglycemic drugs: Secondary | ICD-10-CM | POA: Diagnosis not present

## 2015-07-19 DIAGNOSIS — M79672 Pain in left foot: Secondary | ICD-10-CM | POA: Insufficient documentation

## 2015-07-19 MED ORDER — NAPROXEN 500 MG PO TABS: 500.0000 mg | ORAL_TABLET | Freq: Two times a day (BID) | ORAL | Status: DC

## 2015-07-19 NOTE — ED Provider Notes (Signed)
CSN: [id number]     Arrival date & time 07/19/15  1228 History  By signing my name below, I, Mesha Guinyard, attest that this documentation has been prepared under the direction and in the presence of Treatment Team:  Attending Provider: Tanna Furry, MD Physician Assistant: Kem Parkinson, PA-C.  Electronically Signed: Verlee Monte, Medical Scribe. 07/19/2015. 2:07 PM.   Chief Complaint  Patient presents with  . Foot Pain    The history is provided by the patient. No language interpreter was used.   HPI Comments: Ana [name] is a 45 y.o. female who presents to the Emergency Department complaining of intermittent, bilateral foot pain onset a couple of months ago. Pt reports the pain located on the top of her feet with some associated tightness in her toes. Pt reports ambulating worsens the pain. She states that she has not tried medications with for relief of her symptoms. She denies numbness and any other symptoms. Pt states she saw her PCP 3 months ago for same symptoms, but they shut down in June.  She denies trauma, open wounds, rash, discoloration of her feet or toes.  Past Medical History  Diagnosis Date  . Diabetes mellitus     diet controlled   Past Surgical History  Procedure Laterality Date  . Cesarean section  x4  . Cholecystectomy    . Tubal ligation    . Mass excision  10/27/2011    Procedure: EXCISION MASS;  Surgeon: Donato Heinz, MD;  Location: [facility];  Service: General;  Laterality: Right;   Family History  Problem Relation Age of Onset  . Kidney failure Mother   . Diabetes Mother   . Kidney failure Father   . Kidney failure Brother   . Diabetes Brother    Social History  Substance Use Topics  . Smoking status: Never Smoker   . Smokeless tobacco: Never Used  . Alcohol Use: No   OB History    Gravida Para Term Preterm AB TAB SAB Ectopic Multiple Living   6 5  5 1  1         Review of Systems  Constitutional: Negative for fever and chills.   Genitourinary: Negative for dysuria and difficulty urinating.  Musculoskeletal: Positive for joint swelling and arthralgias.       Bilateral foot pain  Skin: Negative for color change and wound.  Neurological: Negative for weakness and numbness.  All other systems reviewed and are negative.  Allergies  Ranitidine hcl  Home Medications   Prior to Admission medications   Medication Sig Start Date End Date Taking? Authorizing Provider  amoxicillin (AMOXIL) 500 MG capsule Take 2 capsules (1,000 mg total) by mouth 2 (two) times daily. 01/08/13   Orpah Greek, MD  amoxicillin-clarithromycin-lansoprazole [facility]) combo pack Take by mouth 2 (two) times daily. Follow package directions. 01/08/13 01/22/13  Doran Heater, MD  cephALEXin (KEFLEX) 500 MG capsule Take 1 capsule (500 mg total) by mouth 4 (four) times daily. For 10 days 10/26/14   Laurel Harnden, PA-C  clarithromycin (BIAXIN) 500 MG tablet Take 1 tablet (500 mg total) by mouth 2 (two) times daily. 01/08/13   Orpah Greek, MD  gabapentin (NEURONTIN) 300 MG capsule Take 300 mg by mouth daily as needed. 09/03/14   Historical Provider, MD  HYDROcodone-acetaminophen (NORCO/VICODIN) 5-325 MG per tablet Take one-two tabs po q 4-6 hrs prn pain 10/26/14   Demetrias Goodbar, PA-C  metFORMIN (GLUCOPHAGE) 500 MG tablet Take 1 tablet (500 mg total) by  mouth 2 (two) times daily with a meal. 07/24/14   Hope Bunnie Pion, NP  omeprazole (PRILOSEC) 20 MG capsule Take 1 capsule (20 mg total) by mouth 2 (two) times daily before a meal. 01/08/13   Orpah Greek, MD  oxyCODONE-acetaminophen (PERCOCET) 5-325 MG per tablet Take 1 tablet by mouth every 4 (four) hours as needed for severe pain. 01/05/13   Daleen Bo, MD  traMADol (ULTRAM) 50 MG tablet Take 1 tablet (50 mg total) by mouth every 6 (six) hours as needed. 07/24/14   Hope Bunnie Pion, NP   BP 151/73 mmHg  Pulse 86  Temp(Src) 98 F (36.7 C) (Oral)  Resp 18  Ht 5' (1.524 m)  Wt 160 lb  (72.576 kg)  BMI 31.25 kg/m2  SpO2 100%  LMP 06/28/2015 Physical Exam  Constitutional: She is oriented to person, place, and time.  Musculoskeletal: Normal range of motion.  Tenderness dorsal aspect of both feet left worse than right No erythema or edema Sensation intact  Neurological: She is alert and oriented to person, place, and time.  Skin: Skin is warm and dry.    ED Course  Procedures  DIAGNOSTIC STUDIES: Oxygen Saturation is 100% on RA, NL by my interpretation.    COORDINATION OF CARE: 2:13 PM Discussed treatment plan with pt at bedside and pt agreed to plan.  Labs Review Labs Reviewed - No data to display  Imaging Review Dg Foot Complete Left  07/19/2015  CLINICAL DATA:  Pain lateral aspect left foot x couple of months/no known injury EXAM: LEFT FOOT - COMPLETE 3+ VIEW COMPARISON:  09/18/2008 ankle films FINDINGS: Small Achilles spur. No acute fracture or dislocation. No focal osseous lesion. No significant soft tissue swelling. IMPRESSION: Negative. Electronically Signed   By: Abigail Miyamoto M.D.   On: 07/19/2015 14:28    I have personally reviewed and evaluated these images and lab results as part of my medical decision-making.  MDM   Final diagnoses:  Foot pain, bilateral    Pt with bilateral foot pain, left > right.  nml appearing exam. No concerning sx's for cellulitis.  XR neg.   Possibly related to wearing sandals.  Agrees to symptomatic tx and podiatry referral given if needed.     I personally performed the services described in this documentation, which was scribed in my presence. The recorded information has been reviewed and is accurate.    Kem Parkinson, PA-C 07/21/15 [place], PA-C 07/21/15 1519  Tanna Furry, MD 08/05/15 [id number]

## 2015-07-19 NOTE — ED Notes (Signed)
Pt complaining of bilateral foot pain for the past few months. Says her feet feel tight & aching. No new injury. No PCP at present.

## 2015-07-19 NOTE — ED Notes (Signed)
Pt alert & oriented x4, stable gait. Patient given discharge instructions, paperwork & prescription(s). Patient  instructed to stop at the registration desk to finish any additional paperwork. Patient verbalized understanding. Pt left department w/ no further questions. 

## 2015-07-19 NOTE — ED Notes (Signed)
Pt c/o bilateral foot pain since last night. Pt states she has been going to pcp for same.

## 2015-08-11 ENCOUNTER — Encounter (HOSPITAL_COMMUNITY): Payer: Self-pay | Admitting: Emergency Medicine

## 2015-08-11 ENCOUNTER — Emergency Department (HOSPITAL_COMMUNITY)
Admission: EM | Admit: 2015-08-11 | Discharge: 2015-08-12 | Disposition: A | Discharge status: Home / Self Care | Payer: Medicaid Other | Attending: Emergency Medicine | Admitting: Emergency Medicine

## 2015-08-11 ENCOUNTER — Emergency Department (HOSPITAL_COMMUNITY): Payer: Medicaid Other

## 2015-08-11 DIAGNOSIS — E119 Type 2 diabetes mellitus without complications: Secondary | ICD-10-CM | POA: Diagnosis not present

## 2015-08-11 DIAGNOSIS — Z7984 Long term (current) use of oral hypoglycemic drugs: Secondary | ICD-10-CM | POA: Insufficient documentation

## 2015-08-11 DIAGNOSIS — R0789 Other chest pain: Secondary | ICD-10-CM | POA: Diagnosis present

## 2015-08-11 LAB — BASIC METABOLIC PANEL
Anion gap: 6 (ref 5–15)
BUN: 8 mg/dL (ref 6–20)
CHLORIDE: 102 mmol/L (ref 101–111)
CO2: 25 mmol/L (ref 22–32)
CREATININE: 0.67 mg/dL (ref 0.44–1.00)
Calcium: 8.8 mg/dL — ABNORMAL LOW (ref 8.9–10.3)
Glucose, Bld: 227 mg/dL — ABNORMAL HIGH (ref 65–99)
POTASSIUM: 4.3 mmol/L (ref 3.5–5.1)
SODIUM: 133 mmol/L — AB (ref 135–145)

## 2015-08-11 LAB — HEPATIC FUNCTION PANEL
ALBUMIN: 3.5 g/dL (ref 3.5–5.0)
ALK PHOS: 76 U/L (ref 38–126)
ALT: 16 U/L (ref 14–54)
AST: 22 U/L (ref 15–41)
BILIRUBIN TOTAL: 0.7 mg/dL (ref 0.3–1.2)
Bilirubin, Direct: 0.2 mg/dL (ref 0.1–0.5)
Indirect Bilirubin: 0.5 mg/dL (ref 0.3–0.9)
Total Protein: 7.1 g/dL (ref 6.5–8.1)

## 2015-08-11 LAB — CBC
HEMATOCRIT: 32.4 % — AB (ref 36.0–46.0)
Hemoglobin: 11.2 g/dL — ABNORMAL LOW (ref 12.0–15.0)
MCH: 31.5 pg (ref 26.0–34.0)
MCHC: 34.6 g/dL (ref 30.0–36.0)
MCV: 91 fL (ref 78.0–100.0)
Platelets: 241 10*3/uL (ref 150–400)
RBC: 3.56 MIL/uL — AB (ref 3.87–5.11)
RDW: 12.8 % (ref 11.5–15.5)
WBC: 11.9 10*3/uL — AB (ref 4.0–10.5)

## 2015-08-11 LAB — I-STAT TROPONIN, ED: Troponin i, poc: 0 ng/mL (ref 0.00–0.08)

## 2015-08-11 LAB — LIPASE, BLOOD: Lipase: 13 U/L (ref 11–51)

## 2015-08-11 LAB — TROPONIN I: Troponin I: <0.03 ng/mL (ref <0.031)

## 2015-08-11 MED ORDER — HYDROCODONE-ACETAMINOPHEN 5-325 MG PO TABS
1.0000 | ORAL_TABLET | Freq: Once | ORAL | Status: AC
  Administered 2015-08-11: 1 via ORAL
  Filled 2015-08-11: qty 1

## 2015-08-11 MED ORDER — TRAMADOL HCL 50 MG PO TABS: 50.0000 mg | ORAL_TABLET | Freq: Four times a day (QID) | ORAL | Status: DC | PRN

## 2015-08-11 NOTE — ED Notes (Signed)
Pt and family updated on plan of care,  

## 2015-08-11 NOTE — Discharge Instructions (Signed)
Follow-up with family doctor next week for recheck

## 2015-08-11 NOTE — ED Notes (Signed)
Dr Roderic Palau at bedside, see edp assessment for further,

## 2015-08-11 NOTE — ED Notes (Signed)
Family updated,

## 2015-08-11 NOTE — ED Notes (Signed)
Pt c/o left sided chest pain every time she breaths.

## 2015-08-11 NOTE — ED Provider Notes (Signed)
CSN: [id number]     Arrival date & time 08/11/15  2026 History  By signing my name below, I, Ephriam Jenkins, attest that this documentation has been prepared under the direction and in the presence of Milton Ferguson, MD. Electronically signed, Ephriam Jenkins, ED Scribe. 08/11/2015. 9:07 PM.    Chief Complaint  Patient presents with  . Chest Pain   Patient is a 45 y.o. female presenting with chest pain. The history is provided by the patient. No language interpreter was used.  Chest Pain Pain location:  L chest Pain quality: sharp   Pain radiates to:  Does not radiate Pain radiates to the back: no   Pain severity:  Moderate Onset quality:  Gradual Duration:  7 hours Timing:  Constant Progression:  Unchanged Chronicity:  New Context: breathing   Relieved by:  Nothing Associated symptoms: no abdominal pain, no back pain, no cough, no fatigue, no fever and no headache    HPI Comments: Javia Giliberto Swiss is a 45 y.o. female with a PMHx of DM, who presents to the Emergency Department complaining of constant, unchanged, sharp left sided chest pain onset 7 hours ago. Pt also complains of gradually worsening pain to her toes. Pt states her chest pain is exacerbated by taking deep breaths. Pt also reports no alleviating factors. Pt denies cough, fever, or chills. Pt also states she is not employed.  Past Medical History  Diagnosis Date  . Diabetes mellitus     diet controlled   Past Surgical History  Procedure Laterality Date  . Cesarean section  x4  . Cholecystectomy    . Tubal ligation    . Mass excision  10/27/2011    Procedure: EXCISION MASS;  Surgeon: Donato Heinz, MD;  Location: [facility];  Service: General;  Laterality: Right;   Family History  Problem Relation Age of Onset  . Kidney failure Mother   . Diabetes Mother   . Kidney failure Father   . Kidney failure Brother   . Diabetes Brother    Social History  Substance Use Topics  . Smoking status: Never Smoker   . Smokeless  tobacco: Never Used  . Alcohol Use: No   OB History    Gravida Para Term Preterm AB TAB SAB Ectopic Multiple Living   6 5  5 1  1         Review of Systems  Constitutional: Negative for fever, chills, appetite change and fatigue.  HENT: Negative for congestion, ear discharge and sinus pressure.   Eyes: Negative for discharge.  Respiratory: Negative for cough.   Cardiovascular: Positive for chest pain (left sided).  Gastrointestinal: Negative for abdominal pain and diarrhea.  Genitourinary: Negative for frequency and hematuria.  Musculoskeletal: Positive for arthralgias (Bilateral toes). Negative for back pain.  Skin: Negative for rash.  Neurological: Negative for seizures and headaches.  Psychiatric/Behavioral: Negative for hallucinations.      Allergies  Ranitidine hcl  Home Medications   Prior to Admission medications   Medication Sig Start Date End Date Taking? Authorizing Provider  amoxicillin (AMOXIL) 500 MG capsule Take 2 capsules (1,000 mg total) by mouth 2 (two) times daily. 01/08/13   Orpah Greek, MD  amoxicillin-clarithromycin-lansoprazole [facility]) combo pack Take by mouth 2 (two) times daily. Follow package directions. 01/08/13 01/22/13  Doran Heater, MD  cephALEXin (KEFLEX) 500 MG capsule Take 1 capsule (500 mg total) by mouth 4 (four) times daily. For 10 days 10/26/14   Tammy Triplett, PA-C  clarithromycin (BIAXIN) 500  MG tablet Take 1 tablet (500 mg total) by mouth 2 (two) times daily. 01/08/13   Orpah Greek, MD  gabapentin (NEURONTIN) 300 MG capsule Take 300 mg by mouth daily as needed. 09/03/14   Historical Provider, MD  HYDROcodone-acetaminophen (NORCO/VICODIN) 5-325 MG per tablet Take one-two tabs po q 4-6 hrs prn pain 10/26/14   Tammy Triplett, PA-C  metFORMIN (GLUCOPHAGE) 500 MG tablet Take 1 tablet (500 mg total) by mouth 2 (two) times daily with a meal. 07/24/14   Hope Bunnie Pion, NP  naproxen (NAPROSYN) 500 MG tablet Take 1 tablet (500 mg  total) by mouth 2 (two) times daily with a meal. 07/19/15   Tammy Triplett, PA-C  omeprazole (PRILOSEC) 20 MG capsule Take 1 capsule (20 mg total) by mouth 2 (two) times daily before a meal. 01/08/13   Orpah Greek, MD  oxyCODONE-acetaminophen (PERCOCET) 5-325 MG per tablet Take 1 tablet by mouth every 4 (four) hours as needed for severe pain. 01/05/13   Daleen Bo, MD  traMADol (ULTRAM) 50 MG tablet Take 1 tablet (50 mg total) by mouth every 6 (six) hours as needed. 07/24/14   Hope Bunnie Pion, NP   LMP 07/28/2015 Physical Exam  Constitutional: She is oriented to person, place, and time. She appears well-developed.  HENT:  Head: Normocephalic.  Eyes: Conjunctivae and EOM are normal. No scleral icterus.  Neck: Neck supple. No thyromegaly present.  Cardiovascular: Normal rate and regular rhythm.  Exam reveals no gallop and no friction rub.   No murmur heard. Pulmonary/Chest: No stridor. She has no wheezes. She has no rales. She exhibits tenderness.  Tender to left anterior chest  Abdominal: She exhibits no distension. There is no tenderness. There is no rebound.  Musculoskeletal: Normal range of motion. She exhibits no edema.  Mild tenderness to left toes  Lymphadenopathy:    She has no cervical adenopathy.  Neurological: She is oriented to person, place, and time. She exhibits normal muscle tone. Coordination normal.  Skin: No rash noted. No erythema.  Psychiatric: She has a normal mood and affect. Her behavior is normal.  Nursing note and vitals reviewed.   ED Course  Procedures  DIAGNOSTIC STUDIES: Oxygen Saturation is 100% on RA, normal by my interpretation.  COORDINATION OF CARE: 8:52 PM-Will order cardiac monitoring and blood work. Discussed treatment plan with pt at bedside and pt agreed to plan.   Labs Review Labs Reviewed  BASIC METABOLIC PANEL  CBC  TROPONIN I    Imaging Review No results found. I have personally reviewed and evaluated these images and lab  results as part of my medical decision-making.   EKG Interpretation   Date/Time:  Wednesday August 11 2015 20:33:45 EDT Ventricular Rate:  100 PR Interval:  150 QRS Duration: 78 QT Interval:  352 QTC Calculation: 454 R Axis:   67 Text Interpretation:  Normal sinus rhythm Normal ECG Confirmed by Azavier Creson   MD, Rayla Pember [phone]) on 08/11/2015 8:37:11 PM      MDM   Final diagnoses:  None    Labs unremarkable. Suspect chest wall pain. We will check 1 more troponin if this is negative patient will be discharged to be followed up with the PCP.j   The chart was scribed for me under my direct supervision.  I personally performed the history, physical, and medical decision making and all procedures in the evaluation of this patient.Milton Ferguson, MD 08/11/15 [phone]

## 2015-08-12 LAB — I-STAT TROPONIN, ED: Troponin i, poc: 0 ng/mL (ref 0.00–0.08)

## 2015-08-12 MED ORDER — TRAMADOL HCL 50 MG PO TABS
50.0000 mg | ORAL_TABLET | Freq: Once | ORAL | Status: AC
  Administered 2015-08-12: 50 mg via ORAL
  Filled 2015-08-12: qty 1

## 2015-08-17 ENCOUNTER — Other Ambulatory Visit: Payer: Self-pay

## 2015-08-17 ENCOUNTER — Encounter (HOSPITAL_COMMUNITY): Payer: Self-pay | Admitting: *Deleted

## 2015-08-17 ENCOUNTER — Emergency Department (HOSPITAL_COMMUNITY)
Admission: EM | Admit: 2015-08-17 | Discharge: 2015-08-17 | Disposition: A | Discharge status: Home / Self Care | Payer: Medicaid Other | Attending: Emergency Medicine | Admitting: Emergency Medicine

## 2015-08-17 ENCOUNTER — Emergency Department (HOSPITAL_COMMUNITY): Payer: Medicaid Other

## 2015-08-17 DIAGNOSIS — Y999 Unspecified external cause status: Secondary | ICD-10-CM | POA: Diagnosis not present

## 2015-08-17 DIAGNOSIS — R0789 Other chest pain: Secondary | ICD-10-CM | POA: Diagnosis not present

## 2015-08-17 DIAGNOSIS — E119 Type 2 diabetes mellitus without complications: Secondary | ICD-10-CM | POA: Insufficient documentation

## 2015-08-17 DIAGNOSIS — Z7984 Long term (current) use of oral hypoglycemic drugs: Secondary | ICD-10-CM | POA: Insufficient documentation

## 2015-08-17 DIAGNOSIS — Y92410 Unspecified street and highway as the place of occurrence of the external cause: Secondary | ICD-10-CM | POA: Insufficient documentation

## 2015-08-17 DIAGNOSIS — Y9389 Activity, other specified: Secondary | ICD-10-CM | POA: Insufficient documentation

## 2015-08-17 LAB — BASIC METABOLIC PANEL
Anion gap: 12 (ref 5–15)
BUN: 11 mg/dL (ref 6–20)
CHLORIDE: 104 mmol/L (ref 101–111)
CO2: 22 mmol/L (ref 22–32)
Calcium: 9.6 mg/dL (ref 8.9–10.3)
Creatinine, Ser: 0.69 mg/dL (ref 0.44–1.00)
GFR calc Af Amer: >60 mL/min (ref >60)
GFR calc non Af Amer: >60 mL/min (ref >60)
Glucose, Bld: 292 mg/dL — ABNORMAL HIGH (ref 65–99)
POTASSIUM: 3.4 mmol/L — AB (ref 3.5–5.1)
SODIUM: 138 mmol/L (ref 135–145)

## 2015-08-17 LAB — TROPONIN I: Troponin I: <0.03 ng/mL (ref <0.031)

## 2015-08-17 MED ORDER — CYCLOBENZAPRINE HCL 10 MG PO TABS: 10.0000 mg | ORAL_TABLET | Freq: Three times a day (TID) | ORAL | Status: DC | PRN

## 2015-08-17 MED ORDER — NAPROXEN 500 MG PO TABS: ORAL_TABLET | ORAL | Status: DC

## 2015-08-17 MED ORDER — FENTANYL CITRATE (PF) 100 MCG/2ML IJ SOLN
50.0000 ug | Freq: Once | INTRAMUSCULAR | Status: AC
  Administered 2015-08-17: 50 ug via INTRAVENOUS
  Filled 2015-08-17: qty 2

## 2015-08-17 MED ORDER — IOPAMIDOL (ISOVUE-370) INJECTION 76%
100.0000 mL | Freq: Once | INTRAVENOUS | Status: AC | PRN
  Administered 2015-08-17: 100 mL via INTRAVENOUS
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
## 2015-08-17 NOTE — ED Notes (Signed)
EKG performed after pt c/o left sided CP, worse with inhalation and palpation. EKG given to MD Tomi Bamberger.

## 2016-02-29 DIAGNOSIS — E1142 Type 2 diabetes mellitus with diabetic polyneuropathy: Secondary | ICD-10-CM | POA: Diagnosis not present

## 2016-02-29 DIAGNOSIS — I10 Essential (primary) hypertension: Secondary | ICD-10-CM | POA: Diagnosis not present

## 2016-02-29 DIAGNOSIS — Z6834 Body mass index (BMI) 34.0-34.9, adult: Secondary | ICD-10-CM | POA: Diagnosis not present

## 2017-01-07 ENCOUNTER — Encounter (HOSPITAL_COMMUNITY): Payer: Self-pay

## 2017-01-07 ENCOUNTER — Emergency Department (HOSPITAL_COMMUNITY)
Admission: EM | Admit: 2017-01-07 | Discharge: 2017-01-07 | Disposition: A | Discharge status: Home / Self Care | Payer: Medicaid Other | Attending: Emergency Medicine | Admitting: Emergency Medicine

## 2017-01-07 DIAGNOSIS — J069 Acute upper respiratory infection, unspecified: Secondary | ICD-10-CM

## 2017-01-07 DIAGNOSIS — E119 Type 2 diabetes mellitus without complications: Secondary | ICD-10-CM | POA: Diagnosis not present

## 2017-01-07 DIAGNOSIS — Z7984 Long term (current) use of oral hypoglycemic drugs: Secondary | ICD-10-CM | POA: Diagnosis not present

## 2017-01-07 DIAGNOSIS — H65192 Other acute nonsuppurative otitis media, left ear: Secondary | ICD-10-CM

## 2017-01-07 DIAGNOSIS — H9202 Otalgia, left ear: Secondary | ICD-10-CM | POA: Diagnosis present

## 2017-01-07 MED ORDER — AZITHROMYCIN 250 MG PO TABS: ORAL_TABLET | ORAL | 0 refills | Status: DC

## 2017-01-07 MED ORDER — AMOXICILLIN 250 MG PO CAPS
500.0000 mg | ORAL_CAPSULE | Freq: Once | ORAL | Status: AC
  Administered 2017-01-07: 500 mg via ORAL
  Filled 2017-01-07: qty 2

## 2017-01-07 MED ORDER — AZITHROMYCIN 250 MG PO TABS
500.0000 mg | ORAL_TABLET | Freq: Once | ORAL | Status: AC
  Administered 2017-01-07: 500 mg via ORAL
  Filled 2017-01-07: qty 2

## 2017-01-07 MED ORDER — DEXAMETHASONE SODIUM PHOSPHATE 10 MG/ML IJ SOLN
10.0000 mg | Freq: Once | INTRAMUSCULAR | Status: AC
  Administered 2017-01-07: 10 mg via INTRAMUSCULAR
  Filled 2017-01-07: qty 1

## 2017-01-07 MED ORDER — PSEUDOEPHEDRINE HCL 60 MG PO TABS
60.0000 mg | ORAL_TABLET | Freq: Once | ORAL | Status: AC
  Administered 2017-01-07: 60 mg via ORAL
  Filled 2017-01-07: qty 1

## 2017-01-07 MED ORDER — HYDROCODONE-ACETAMINOPHEN 5-325 MG PO TABS
2.0000 | ORAL_TABLET | Freq: Once | ORAL | Status: AC
  Administered 2017-01-07: 2 via ORAL
  Filled 2017-01-07: qty 2

## 2017-01-07 MED ORDER — PROMETHAZINE HCL 12.5 MG PO TABS
12.5000 mg | ORAL_TABLET | Freq: Once | ORAL | Status: AC
  Administered 2017-01-07: 12.5 mg via ORAL
  Filled 2017-01-07: qty 1

## 2017-01-07 MED ORDER — HYDROCODONE-ACETAMINOPHEN 5-325 MG PO TABS: 1.0000 | ORAL_TABLET | ORAL | 0 refills | Status: DC | PRN

## 2017-01-07 NOTE — ED Triage Notes (Signed)
Pt c/o left earache since yesterday, worse today.

## 2017-01-07 NOTE — Discharge Instructions (Signed)
Please use sudafed three times daily for congestion. Use zithromax daily starting 11/12. Use tylenol every 4 hour for mild pain. Use norco for more severe pain. Please see your Medicaid Access MD  for recheck of your ear, and for recheck of your blood pressure. Your pressure was elevated today.

## 2017-01-07 NOTE — ED Provider Notes (Addendum)
[facility] EMERGENCY DEPARTMENT Provider Note   CSN: [id number] Arrival date & time: 01/07/17  6295     History   Chief Complaint Chief Complaint  Patient presents with  . Otalgia    HPI Ana [name] is a 46 y.o. female.  The history is provided by the patient.  Otalgia  This is a new problem. The current episode started yesterday. There is pain in the left ear. The problem occurs hourly. The problem has been gradually worsening. There has been no fever. The pain is moderate. Pertinent negatives include no ear discharge, no abdominal pain, no neck pain and no cough. Her past medical history does not include chronic ear infection.    Past Medical History:  Diagnosis Date  . Diabetes mellitus    diet controlled    There are no active problems to display for this patient.   Past Surgical History:  Procedure Laterality Date  . CESAREAN SECTION  x4  . CHOLECYSTECTOMY    . TUBAL LIGATION      OB History    Gravida Para Term Preterm AB Living   6 5   5 1      SAB TAB Ectopic Multiple Live Births   1               Home Medications    Prior to Admission medications   Medication Sig Start Date End Date Taking? Authorizing Provider  amoxicillin (AMOXIL) 500 MG capsule Take 2 capsules (1,000 mg total) by mouth 2 (two) times daily. Patient not taking: Reported on 08/11/2015 01/08/13   Orpah Greek, MD  amoxicillin-clarithromycin-lansoprazole [facility]) combo pack Take by mouth 2 (two) times daily. Follow package directions. 01/08/13 01/22/13  Doran Heater, MD  cephALEXin (KEFLEX) 500 MG capsule Take 1 capsule (500 mg total) by mouth 4 (four) times daily. For 10 days Patient not taking: Reported on 08/11/2015 10/26/14   Kem Parkinson, PA-C  clarithromycin (BIAXIN) 500 MG tablet Take 1 tablet (500 mg total) by mouth 2 (two) times daily. Patient not taking: Reported on 08/11/2015 01/08/13   Orpah Greek, MD  cyclobenzaprine (FLEXERIL) 10 MG tablet  Take 1 tablet (10 mg total) by mouth 3 (three) times daily as needed (muscle soreness). 08/17/15   Rolland Porter, MD  HYDROcodone-acetaminophen (NORCO/VICODIN) 5-325 MG per tablet Take one-two tabs po q 4-6 hrs prn pain Patient not taking: Reported on 08/11/2015 10/26/14   Kem Parkinson, PA-C  metFORMIN (GLUCOPHAGE) 500 MG tablet Take 1 tablet (500 mg total) by mouth 2 (two) times daily with a meal. 07/24/14   Ashley Murrain, NP  naproxen (NAPROSYN) 500 MG tablet Take 1 po BID with food prn pain 08/17/15   Rolland Porter, MD  omeprazole (PRILOSEC) 20 MG capsule Take 1 capsule (20 mg total) by mouth 2 (two) times daily before a meal. Patient not taking: Reported on 08/11/2015 01/08/13   Orpah Greek, MD  oxyCODONE-acetaminophen (PERCOCET) 5-325 MG per tablet Take 1 tablet by mouth every 4 (four) hours as needed for severe pain. Patient not taking: Reported on 08/11/2015 01/05/13   Daleen Bo, MD  traMADol (ULTRAM) 50 MG tablet Take 1 tablet (50 mg total) by mouth every 6 (six) hours as needed. 08/11/15   Milton Ferguson, MD    Family History Family History  Problem Relation Age of Onset  . Kidney failure Mother   . Diabetes Mother   . Kidney failure Father   . Kidney failure Brother   . Diabetes Brother  Social History Social History   Tobacco Use  . Smoking status: Never Smoker  . Smokeless tobacco: Never Used  Substance Use Topics  . Alcohol use: No  . Drug use: No     Allergies   Ranitidine hcl   Review of Systems Review of Systems  Constitutional: Negative for activity change and fever.       All ROS Neg except as noted in HPI  HENT: Positive for congestion and ear pain. Negative for ear discharge and nosebleeds.   Eyes: Negative for photophobia and discharge.  Respiratory: Negative for cough, shortness of breath and wheezing.   Cardiovascular: Negative for chest pain and palpitations.  Gastrointestinal: Negative for abdominal pain and blood in stool.    Genitourinary: Negative for dysuria, frequency and hematuria.  Musculoskeletal: Negative for arthralgias, back pain and neck pain.  Skin: Negative.   Neurological: Negative for dizziness, seizures and speech difficulty.  Psychiatric/Behavioral: Negative for confusion and hallucinations.     Physical Exam Updated Vital Signs BP (!) 181/119 (BP Location: Left Arm)   Pulse (!) 105   Temp 98.9 F (37.2 C) (Oral)   Resp 20   Ht 5' (1.524 m)   Wt 73 kg (161 lb)   LMP 01/05/2017 (Exact Date)   SpO2 100%   BMI 31.44 kg/m   Physical Exam  Constitutional: She is oriented to person, place, and time. She appears well-developed and well-nourished.  Non-toxic appearance.  HENT:  Head: Normocephalic.  Right Ear: Tympanic membrane, external ear and ear canal normal.  Left Ear: External ear and ear canal normal. Tympanic membrane is injected, erythematous and bulging.  Nasal congestion present.  Eyes: EOM and lids are normal. Pupils are equal, round, and reactive to light.  Neck: Normal range of motion. Neck supple. Carotid bruit is not present.  Cardiovascular: Normal rate, regular rhythm, normal heart sounds, intact distal pulses and normal pulses.  Pulmonary/Chest: Breath sounds normal. No respiratory distress.  Abdominal: Soft. Bowel sounds are normal. There is no tenderness. There is no guarding.  Musculoskeletal: Normal range of motion.  Lymphadenopathy:       Head (right side): No submandibular adenopathy present.       Head (left side): No submandibular adenopathy present.    She has no cervical adenopathy.  Neurological: She is alert and oriented to person, place, and time. She has normal strength. No cranial nerve deficit or sensory deficit.  Skin: Skin is warm and dry.  Psychiatric: She has a normal mood and affect. Her speech is normal.  Nursing note and vitals reviewed.    ED Treatments / Results  Labs (all labs ordered are listed, but only abnormal results are  displayed) Labs Reviewed - No data to display  EKG  EKG Interpretation None       Radiology No results found.  Procedures Procedures (including critical care time)  Medications Ordered in ED Medications  HYDROcodone-acetaminophen (NORCO/VICODIN) 5-325 MG per tablet 2 tablet (not administered)  promethazine (PHENERGAN) tablet 12.5 mg (not administered)  amoxicillin (AMOXIL) capsule 500 mg (not administered)  azithromycin (ZITHROMAX) tablet 500 mg (not administered)  pseudoephedrine (SUDAFED) tablet 60 mg (not administered)  dexamethasone (DECADRON) injection 10 mg (not administered)     Initial Impression / Assessment and Plan / ED Course  I have reviewed the triage vital signs and the nursing notes.  Pertinent labs & imaging results that were available during my care of the patient were reviewed by me and considered in my medical decision making (see  chart for details).       Final Clinical Impressions(s) / ED Diagnoses MDM Patient's blood pressure is elevated at 181/119.  Patient has a history of hypertension.  I have asked the patient to see her primary physician for recheck of her blood pressure.  Examination favors left otitis media and upper respiratory infection.  The patient will use Zithromax, Sudafed, Tylenol, and hydrocodone for pain.  Patient was given a single injection of steroid medication here in the emergency department.  Patient will return to the emergency department if any emergent changes, problems, or concerns.   Final diagnoses:  Other acute nonsuppurative otitis media of left ear, recurrence not specified  Upper respiratory tract infection, unspecified type    ED Discharge Orders        Ordered    azithromycin (ZITHROMAX) 250 MG tablet     01/07/17 0934    HYDROcodone-acetaminophen (NORCO/VICODIN) 5-325 MG tablet  Every 4 hours PRN     01/07/17 Edison, [place], PA-C 01/07/17 0940    Mesner, Corene Cornea, MD 01/07/17 1557     Lily Kocher, PA-C 01/30/17 0113    Mesner, Corene Cornea, MD 02/06/17 [phone]

## 2017-01-25 ENCOUNTER — Other Ambulatory Visit (HOSPITAL_COMMUNITY): Payer: Self-pay | Admitting: Internal Medicine

## 2017-01-25 DIAGNOSIS — Z1231 Encounter for screening mammogram for malignant neoplasm of breast: Secondary | ICD-10-CM

## 2017-02-05 ENCOUNTER — Ambulatory Visit (HOSPITAL_COMMUNITY): Payer: Medicaid Other

## 2017-02-15 ENCOUNTER — Encounter (HOSPITAL_COMMUNITY): Payer: Self-pay

## 2017-02-15 ENCOUNTER — Ambulatory Visit (HOSPITAL_COMMUNITY)
Admission: RE | Admit: 2017-02-15 | Discharge: 2017-02-15 | Disposition: A | Discharge status: Home / Self Care | Payer: Medicaid Other | Source: Ambulatory Visit | Attending: Internal Medicine | Admitting: Internal Medicine

## 2017-02-15 DIAGNOSIS — Z1231 Encounter for screening mammogram for malignant neoplasm of breast: Secondary | ICD-10-CM | POA: Diagnosis present

## 2017-05-05 LAB — LIPID PANEL
CHOLESTEROL: 124 (ref 0–200)
HDL: 49 (ref 35–70)
LDL Cholesterol: 62
TRIGLYCERIDES: 52 (ref 40–160)

## 2017-05-05 LAB — BASIC METABOLIC PANEL
BUN: 11 (ref 4–21)
Creatinine: 0.7 (ref <=1.1)

## 2017-05-05 LAB — HEMOGLOBIN A1C: Hemoglobin A1C: 12.8

## 2017-07-02 ENCOUNTER — Other Ambulatory Visit: Payer: Self-pay

## 2017-07-02 ENCOUNTER — Emergency Department (HOSPITAL_COMMUNITY)
Admission: EM | Admit: 2017-07-02 | Discharge: 2017-07-02 | Disposition: A | Discharge status: Home / Self Care | Payer: Medicaid Other | Attending: Emergency Medicine | Admitting: Emergency Medicine

## 2017-07-02 ENCOUNTER — Encounter (HOSPITAL_COMMUNITY): Payer: Self-pay | Admitting: Emergency Medicine

## 2017-07-02 DIAGNOSIS — R251 Tremor, unspecified: Secondary | ICD-10-CM | POA: Insufficient documentation

## 2017-07-02 DIAGNOSIS — T383X5A Adverse effect of insulin and oral hypoglycemic [antidiabetic] drugs, initial encounter: Secondary | ICD-10-CM

## 2017-07-02 DIAGNOSIS — E160 Drug-induced hypoglycemia without coma: Secondary | ICD-10-CM

## 2017-07-02 DIAGNOSIS — E11649 Type 2 diabetes mellitus with hypoglycemia without coma: Secondary | ICD-10-CM | POA: Insufficient documentation

## 2017-07-02 DIAGNOSIS — Z79899 Other long term (current) drug therapy: Secondary | ICD-10-CM | POA: Insufficient documentation

## 2017-07-02 DIAGNOSIS — R61 Generalized hyperhidrosis: Secondary | ICD-10-CM | POA: Diagnosis not present

## 2017-07-02 DIAGNOSIS — Z794 Long term (current) use of insulin: Secondary | ICD-10-CM | POA: Insufficient documentation

## 2017-07-02 LAB — CBG MONITORING, ED
GLUCOSE-CAPILLARY: 96 mg/dL (ref 65–99)
Glucose-Capillary: 117 mg/dL — ABNORMAL HIGH (ref 65–99)

## 2017-07-02 NOTE — ED Provider Notes (Signed)
[facility] EMERGENCY DEPARTMENT Provider Note   CSN: [id number] Arrival date & time: 07/02/17  1724     History   Chief Complaint Chief Complaint  Patient presents with  . Hypoglycemia    HPI Ana [name] is a 47 y.o. female.  47 year old diabetic had acute onset of shaking sweats and feeling unwell while she was at home approximately hour after she took her insulin.  She had her family member called 58 and when EMS got there they found her blood sugar to be 40.  She got an amp of D50 and symptoms were improving upon arrival here.  Patient states she has had no acute recent illness and has not had any recent medication changes.  She does not feel back to baseline but she states she feels a lot better and has been up and ambulatory to the bathroom.  The history is provided by the patient and the EMS personnel.  Hypoglycemia  Initial blood sugar:  550 Blood sugar after intervention:  170 Severity:  Moderate Onset quality:  Sudden Progression:  Partially resolved Chronicity:  New Diabetic status:  Controlled with insulin and controlled with oral medications Context: decreased oral intake   Relieved by:  IV glucose Ineffective treatments:  None tried Associated symptoms: altered mental status, sweats and tremors   Associated symptoms: no seizures, no shortness of breath and no vomiting     Past Medical History:  Diagnosis Date  . Diabetes mellitus    diet controlled    There are no active problems to display for this patient.   Past Surgical History:  Procedure Laterality Date  . CESAREAN SECTION  x4  . CHOLECYSTECTOMY    . MASS EXCISION  10/27/2011   Procedure: EXCISION MASS;  Surgeon: Donato Heinz, MD;  Location: [facility];  Service: General;  Laterality: Right;  . TUBAL LIGATION       OB History    Gravida  6   Para  5   Term      Preterm  5   AB  1   Living        SAB  1   TAB      Ectopic      Multiple      Live Births                Home Medications    Prior to Admission medications   Medication Sig Start Date End Date Taking? Authorizing Provider  amoxicillin (AMOXIL) 500 MG capsule Take 2 capsules (1,000 mg total) by mouth 2 (two) times daily. Patient not taking: Reported on 08/11/2015 01/08/13   Orpah Greek, MD  amoxicillin-clarithromycin-lansoprazole [facility]) combo pack Take by mouth 2 (two) times daily. Follow package directions. 01/08/13 01/22/13  Doran Heater, MD  azithromycin (ZITHROMAX) 250 MG tablet 1 po daily with food 01/07/17   Lily Kocher, PA-C  cephALEXin (KEFLEX) 500 MG capsule Take 1 capsule (500 mg total) by mouth 4 (four) times daily. For 10 days Patient not taking: Reported on 08/11/2015 10/26/14   Kem Parkinson, PA-C  clarithromycin (BIAXIN) 500 MG tablet Take 1 tablet (500 mg total) by mouth 2 (two) times daily. Patient not taking: Reported on 08/11/2015 01/08/13   Orpah Greek, MD  cyclobenzaprine (FLEXERIL) 10 MG tablet Take 1 tablet (10 mg total) by mouth 3 (three) times daily as needed (muscle soreness). 08/17/15   Rolland Porter, MD  HYDROcodone-acetaminophen (NORCO/VICODIN) 5-325 MG tablet Take 1 tablet every 4 (four) hours  as needed by mouth. 01/07/17   Lily Kocher, PA-C  metFORMIN (GLUCOPHAGE) 500 MG tablet Take 1 tablet (500 mg total) by mouth 2 (two) times daily with a meal. 07/24/14   Janit Bern, [place], NP  naproxen (NAPROSYN) 500 MG tablet Take 1 po BID with food prn pain 08/17/15   Rolland Porter, MD  omeprazole (PRILOSEC) 20 MG capsule Take 1 capsule (20 mg total) by mouth 2 (two) times daily before a meal. Patient not taking: Reported on 08/11/2015 01/08/13   Orpah Greek, MD  oxyCODONE-acetaminophen (PERCOCET) 5-325 MG per tablet Take 1 tablet by mouth every 4 (four) hours as needed for severe pain. Patient not taking: Reported on 08/11/2015 01/05/13   Daleen Bo, MD  traMADol (ULTRAM) 50 MG tablet Take 1 tablet (50 mg total) by mouth every 6 (six) hours  as needed. 08/11/15   Milton Ferguson, MD    Family History Family History  Problem Relation Age of Onset  . Kidney failure Mother   . Diabetes Mother   . Kidney failure Father   . Kidney failure Brother   . Diabetes Brother     Social History Social History   Tobacco Use  . Smoking status: Never Smoker  . Smokeless tobacco: Never Used  Substance Use Topics  . Alcohol use: No  . Drug use: No     Allergies   Ranitidine hcl   Review of Systems Review of Systems  Constitutional: Positive for diaphoresis. Negative for fever.  HENT: Negative for sore throat.   Respiratory: Negative for shortness of breath.   Cardiovascular: Negative for chest pain.  Gastrointestinal: Positive for diarrhea. Negative for abdominal pain, nausea and vomiting.  Genitourinary: Negative for dysuria.  Musculoskeletal: Negative for neck pain.  Skin: Negative for rash.  Neurological: Positive for tremors. Negative for seizures.     Physical Exam Updated Vital Signs BP 129/72 (BP Location: Right Arm)   Pulse 83   Temp (!) 97 F (36.1 C) (Oral)   Resp 17   Ht 5\' 8"  (1.727 m)   Wt 83 kg (183 lb)   SpO2 100%   BMI 27.83 kg/m   Physical Exam  Constitutional: She appears well-developed and well-nourished. No distress.  HENT:  Head: Normocephalic and atraumatic.  Eyes: Conjunctivae are normal.  Neck: Neck supple.  Cardiovascular: Normal rate and regular rhythm.  No murmur heard. Pulmonary/Chest: Effort normal and breath sounds normal. No respiratory distress.  Abdominal: Soft. There is no tenderness.  Musculoskeletal: She exhibits no edema, tenderness or deformity.  Neurological: She is alert. She has normal strength. No cranial nerve deficit or sensory deficit. Gait normal. GCS eye subscore is 4. GCS verbal subscore is 5. GCS motor subscore is 6.  Skin: Skin is warm and dry. Capillary refill takes less than 2 seconds.  Psychiatric: She has a normal mood and affect.  Nursing note and  vitals reviewed.    ED Treatments / Results  Labs (all labs ordered are listed, but only abnormal results are displayed) Labs Reviewed  CBG MONITORING, ED - Abnormal; Notable for the following components:      Result Value   Glucose-Capillary 117 (*)    All other components within normal limits  CBG MONITORING, ED    EKG None  Radiology No results found.  Procedures Procedures (including critical care time)  Medications Ordered in ED Medications - No data to display   Initial Impression / Assessment and Plan / ED Course  I have reviewed the triage vital  signs and the nursing notes.  Pertinent labs & imaging results that were available during my care of the patient were reviewed by me and considered in my medical decision making (see chart for details).  Clinical Course as of Jul 03 1644  Mon Jul 02, 2017  1749 Patient here with symptomatic hypoglycemic episode after taking her insulin and not eating.  She is now awake and alert and taking some oral sugary drinks and some more substantial carbohydrates.  We will continue to follow along.   [MB]  1920 Patient's blood sugars remained up and she is eaten here.  She feels back to baseline and family is here to take her home.  She understands indications to return   [MB]  1921 .   [MB]    Clinical Course User Index [MB] Hayden Rasmussen, MD    Final Clinical Impressions(s) / ED Diagnoses   Final diagnoses:  Hypoglycemia due to insulin    ED Discharge Orders    None       Hayden Rasmussen, MD 07/03/17 [phone]

## 2017-07-02 NOTE — ED Notes (Signed)
Pt states she took 30 units of reg insulin approx 1 hour ago and laid down for a nap immediately after

## 2017-07-02 NOTE — ED Notes (Signed)
Pt given peanut butter, graham crackers, chocolate milk and apple juice

## 2017-07-02 NOTE — ED Triage Notes (Signed)
bg 40 upon ems arrival, given 1 amp of d50 last bg was 193.  Pt is awake and alert but lethargic.

## 2017-07-02 NOTE — Discharge Instructions (Addendum)
You were evaluated in the emergency department for low blood sugar.  This likely happened because you took your insulin but did not have enough food in your system.  Please be more careful about keeping to your regular intake when using her insulin.  Follow-up with your regular doctor and return if any concerns.

## 2017-08-27 DIAGNOSIS — E1140 Type 2 diabetes mellitus with diabetic neuropathy, unspecified: Secondary | ICD-10-CM | POA: Diagnosis not present

## 2017-08-27 DIAGNOSIS — I10 Essential (primary) hypertension: Secondary | ICD-10-CM | POA: Diagnosis not present

## 2017-08-27 DIAGNOSIS — Z6833 Body mass index (BMI) 33.0-33.9, adult: Secondary | ICD-10-CM | POA: Diagnosis not present

## 2017-09-25 ENCOUNTER — Emergency Department (HOSPITAL_COMMUNITY)
Admission: EM | Admit: 2017-09-25 | Discharge: 2017-09-25 | Disposition: A | Discharge status: Home / Self Care | Payer: Medicaid Other | Attending: Emergency Medicine | Admitting: Emergency Medicine

## 2017-09-25 ENCOUNTER — Other Ambulatory Visit: Payer: Self-pay

## 2017-09-25 ENCOUNTER — Encounter (HOSPITAL_COMMUNITY): Payer: Self-pay | Admitting: Emergency Medicine

## 2017-09-25 DIAGNOSIS — E119 Type 2 diabetes mellitus without complications: Secondary | ICD-10-CM | POA: Insufficient documentation

## 2017-09-25 DIAGNOSIS — R42 Dizziness and giddiness: Secondary | ICD-10-CM

## 2017-09-25 DIAGNOSIS — R110 Nausea: Secondary | ICD-10-CM | POA: Insufficient documentation

## 2017-09-25 DIAGNOSIS — Z79899 Other long term (current) drug therapy: Secondary | ICD-10-CM | POA: Diagnosis not present

## 2017-09-25 DIAGNOSIS — R0602 Shortness of breath: Secondary | ICD-10-CM | POA: Insufficient documentation

## 2017-09-25 DIAGNOSIS — Z794 Long term (current) use of insulin: Secondary | ICD-10-CM | POA: Diagnosis not present

## 2017-09-25 LAB — CBC WITH DIFFERENTIAL/PLATELET
BASOS PCT: 0 %
Basophils Absolute: 0 10*3/uL (ref 0.0–0.1)
Eosinophils Absolute: 0.2 10*3/uL (ref 0.0–0.7)
Eosinophils Relative: 2 %
HEMATOCRIT: 37 % (ref 36.0–46.0)
HEMOGLOBIN: 13 g/dL (ref 12.0–15.0)
LYMPHS ABS: 2.3 10*3/uL (ref 0.7–4.0)
LYMPHS PCT: 28 %
MCH: 31.4 pg (ref 26.0–34.0)
MCHC: 35.1 g/dL (ref 30.0–36.0)
MCV: 89.4 fL (ref 78.0–100.0)
MONO ABS: 0.5 10*3/uL (ref 0.1–1.0)
Monocytes Relative: 6 %
NEUTROS ABS: 5.4 10*3/uL (ref 1.7–7.7)
NEUTROS PCT: 64 %
Platelets: 297 10*3/uL (ref 150–400)
RBC: 4.14 MIL/uL (ref 3.87–5.11)
RDW: 11.9 % (ref 11.5–15.5)
WBC: 8.3 10*3/uL (ref 4.0–10.5)

## 2017-09-25 LAB — BASIC METABOLIC PANEL
Anion gap: 10 (ref 5–15)
BUN: 16 mg/dL (ref 6–20)
CHLORIDE: 102 mmol/L (ref 98–111)
CO2: 25 mmol/L (ref 22–32)
CREATININE: 0.77 mg/dL (ref 0.44–1.00)
Calcium: 9.6 mg/dL (ref 8.9–10.3)
GFR calc non Af Amer: >60 mL/min (ref >60)
GLUCOSE: 174 mg/dL — AB (ref 70–99)
Potassium: 4.1 mmol/L (ref 3.5–5.1)
Sodium: 137 mmol/L (ref 135–145)

## 2017-09-25 LAB — CBG MONITORING, ED
Glucose-Capillary: 172 mg/dL — ABNORMAL HIGH (ref 70–99)
Glucose-Capillary: 154 mg/dL — ABNORMAL HIGH (ref 70–99)

## 2017-09-25 MED ORDER — ONDANSETRON 4 MG PO TBDP: 4.0000 mg | ORAL_TABLET | Freq: Three times a day (TID) | ORAL | 0 refills | Status: DC | PRN

## 2017-09-25 MED ORDER — SODIUM CHLORIDE 0.9 % IV BOLUS: 1000.0000 mL | Freq: Once | INTRAVENOUS | Status: DC

## 2017-09-25 MED ORDER — ONDANSETRON HCL 4 MG/2ML IJ SOLN
4.0000 mg | Freq: Once | INTRAMUSCULAR | Status: DC
Start: 2017-09-25 — End: 2017-09-26
  Filled 2017-09-25: qty 2

## 2017-09-25 NOTE — ED Notes (Signed)
Was unsuccessful in getting iv, pt refused anymore iv sticks and edp notified.

## 2017-09-25 NOTE — ED Triage Notes (Signed)
Pt woke up this morning c/o of dizziness. CBG at 211.

## 2017-09-25 NOTE — ED Notes (Signed)
Checked with pt for urine sample,pt states can't go right now will try back in 30 minutes.

## 2017-09-25 NOTE — Discharge Instructions (Addendum)
You may have a virus or other mild infection.  Keep yourself hydrated.  Follow-up with Dr. Legrand Rams as needed.

## 2017-09-26 ENCOUNTER — Encounter: Payer: Self-pay | Admitting: "Endocrinology

## 2017-09-26 ENCOUNTER — Ambulatory Visit (INDEPENDENT_AMBULATORY_CARE_PROVIDER_SITE_OTHER): Payer: Medicaid Other | Admitting: "Endocrinology

## 2017-09-26 ENCOUNTER — Other Ambulatory Visit: Payer: Self-pay

## 2017-09-26 VITALS — BP 122/82 | HR 94 | Ht 60.0 in | Wt 192.0 lb

## 2017-09-26 DIAGNOSIS — E1165 Type 2 diabetes mellitus with hyperglycemia: Secondary | ICD-10-CM | POA: Diagnosis not present

## 2017-09-26 DIAGNOSIS — Z6837 Body mass index (BMI) 37.0-37.9, adult: Secondary | ICD-10-CM

## 2017-09-26 DIAGNOSIS — I10 Essential (primary) hypertension: Secondary | ICD-10-CM | POA: Diagnosis not present

## 2017-09-26 DIAGNOSIS — E782 Mixed hyperlipidemia: Secondary | ICD-10-CM

## 2017-09-26 DIAGNOSIS — E11641 Type 2 diabetes mellitus with hypoglycemia with coma: Secondary | ICD-10-CM | POA: Insufficient documentation

## 2017-09-26 DIAGNOSIS — E1169 Type 2 diabetes mellitus with other specified complication: Secondary | ICD-10-CM | POA: Insufficient documentation

## 2017-09-26 NOTE — ED Provider Notes (Signed)
[facility] EMERGENCY DEPARTMENT Provider Note   CSN: [id number] Arrival date & time: 09/25/17  1706     History   Chief Complaint Chief Complaint  Patient presents with  . Dizziness    HPI Ana [name] is a 47 y.o. female.  HPI Patient presents with dizziness.  States woke up feeling bad this morning.  States she felt lightheaded.  Has had some nausea.  Has had some myalgias.  No vomiting.  No dysuria.  No cough.  Does feel slightly short of breath.  Sugar was elevated this morning 210.  She is diabetic.No sick contacts. Past Medical History:  Diagnosis Date  . Diabetes mellitus    diet controlled    There are no active problems to display for this patient.   Past Surgical History:  Procedure Laterality Date  . CESAREAN SECTION  x4  . CHOLECYSTECTOMY    . MASS EXCISION  10/27/2011   Procedure: EXCISION MASS;  Surgeon: Donato Heinz, MD;  Location: [facility];  Service: General;  Laterality: Right;  . TUBAL LIGATION       OB History    Gravida  6   Para  5   Term      Preterm  5   AB  1   Living        SAB  1   TAB      Ectopic      Multiple      Live Births               Home Medications    Prior to Admission medications   Medication Sig Start Date End Date Taking? Authorizing Provider  amLODipine (NORVASC) 10 MG tablet Take 10 mg by mouth every morning. 08/31/17  Yes [provider]  LANTUS SOLOSTAR 100 UNIT/ML Solostar Pen Inject 30 Units into the skin every evening. 08/31/17  Yes [provider]  lisinopril-hydrochlorothiazide (PRINZIDE,ZESTORETIC) 20-12.5 MG tablet Take 1 tablet by mouth every morning. 07/25/17  Yes [provider]  metFORMIN (GLUCOPHAGE) 500 MG tablet Take 1 tablet (500 mg total) by mouth 2 (two) times daily with a meal. Patient taking differently: Take 1,000 mg by mouth 2 (two) times daily with a meal.  07/24/14  Yes Neese, [place] M, NP  ondansetron (ZOFRAN-ODT) 4 MG disintegrating tablet Take 1  tablet (4 mg total) by mouth every 8 (eight) hours as needed for nausea or vomiting. 09/25/17   Davonna Belling, MD    Family History Family History  Problem Relation Age of Onset  . Kidney failure Mother   . Diabetes Mother   . Kidney failure Father   . Kidney failure Brother   . Diabetes Brother     Social History Social History   Tobacco Use  . Smoking status: Never Smoker  . Smokeless tobacco: Never Used  Substance Use Topics  . Alcohol use: No  . Drug use: No     Allergies   Ranitidine hcl   Review of Systems Review of Systems  Constitutional: Negative for appetite change.  HENT: Negative for congestion.   Respiratory: Negative for shortness of breath.   Cardiovascular: Negative for chest pain.  Gastrointestinal: Positive for nausea. Negative for abdominal pain.  Genitourinary: Negative for enuresis.  Musculoskeletal: Positive for myalgias.  Skin: Negative for pallor.  Neurological: Positive for dizziness. Negative for weakness.  Psychiatric/Behavioral: Negative for behavioral problems.     Physical Exam Updated Vital Signs BP (!) 151/87   Pulse 87  Temp 98 F (36.7 C) (Oral)   Resp 17   Ht 5\' 5"  (1.651 m)   Wt 87.1 kg (192 lb)   SpO2 99%   BMI 31.95 kg/m    Physical Exam  Constitutional: She appears well-developed.  HENT:  Head: Normocephalic.  Eyes: EOM are normal.  Cardiovascular: Normal rate.  Pulmonary/Chest: Effort normal.  Abdominal: Soft.  Musculoskeletal: She exhibits no edema.  Neurological: She is alert.  Skin: Skin is warm. Capillary refill takes less than 2 seconds.     ED Treatments / Results  Labs (all labs ordered are listed, but only abnormal results are displayed) Labs Reviewed  BASIC METABOLIC PANEL - Abnormal; Notable for the following components:      Result Value   Glucose, Bld 174 (*)    All other components within normal limits  CBG MONITORING, ED - Abnormal; Notable for the following components:    Glucose-Capillary 172 (*)    All other components within normal limits  CBG MONITORING, ED - Abnormal; Notable for the following components:   Glucose-Capillary 154 (*)    All other components within normal limits  CBC WITH DIFFERENTIAL/PLATELET  URINALYSIS, ROUTINE W REFLEX MICROSCOPIC    EKG EKG Interpretation  Date/Time:  Tuesday September 25 2017 20:57:22 EDT Ventricular Rate:  94 PR Interval:  152 QRS Duration: 82 QT Interval:  383 QTC Calculation: 479 R Axis:   86 Text Interpretation:  Sinus rhythm   Baseline wander in lead(s) II III aVL aVF V1 Confirmed by Davonna Belling [id number]) on 09/25/2017 11:02:32 PM   Radiology No results found.  Procedures Procedures (including critical care time)  Medications Ordered in ED Medications  sodium chloride 0.9 % bolus 1,000 mL (1,000 mLs Intravenous Refused 09/25/17 2256)  ondansetron (ZOFRAN) injection 4 mg (4 mg Intravenous Refused 09/25/17 2256)     Initial Impression / Assessment and Plan / ED Course  I have reviewed the triage vital signs and the nursing notes.  Pertinent labs & imaging results that were available during my care of the patient were reviewed by me and considered in my medical decision making (see chart for details).     Patient with lightheadedness.  Lab work reassuring.  Initially plan for saline bolus but patient was not able to get access.  Did not want more sticks.  I think it is reasonable to treat her with oral Zofran.  May be viral infection just starting out.  Discharged home to follow-up with PCP as needed.  Final Clinical Impressions(s) / ED Diagnoses   Final diagnoses:  Dizziness    ED Discharge Orders        Ordered    ondansetron (ZOFRAN-ODT) 4 MG disintegrating tablet  Every 8 hours PRN     09/25/17 2310      Davonna Belling, MD 09/26/17 [phone]

## 2017-09-26 NOTE — Progress Notes (Signed)
Endocrinology Consult Note       09/26/2017, 2:20 PM   Subjective:    Patient ID: Ana [name], female    DOB: 03-May-1970.  Ana [name] is being seen in consultation for management of currently uncontrolled symptomatic diabetes requested by  Rosita Fire, MD.   Past Medical History:  Diagnosis Date  . Diabetes mellitus    diet controlled   Past Surgical History:  Procedure Laterality Date  . CESAREAN SECTION  x4  . CHOLECYSTECTOMY    . MASS EXCISION  10/27/2011   Procedure: EXCISION MASS;  Surgeon: Donato Heinz, MD;  Location: [facility];  Service: General;  Laterality: Right;  . TUBAL LIGATION     Social History   Socioeconomic History  . Marital status: Single    Spouse name: Not on file  . Number of children: Not on file  . Years of education: Not on file  . Highest education level: Not on file  Occupational History  . Not on file  Social Needs  . Financial resource strain: Not on file  . Food insecurity:    Worry: Not on file    Inability: Not on file  . Transportation needs:    Medical: Not on file    Non-medical: Not on file  Tobacco Use  . Smoking status: Never Smoker  . Smokeless tobacco: Never Used  Substance and Sexual Activity  . Alcohol use: No  . Drug use: No  . Sexual activity: Yes    Birth control/protection: Surgical  Lifestyle  . Physical activity:    Days per week: Not on file    Minutes per session: Not on file  . Stress: Not on file  Relationships  . Social connections:    Talks on phone: Not on file    Gets together: Not on file    Attends religious service: Not on file    Active member of club or organization: Not on file    Attends meetings of clubs or organizations: Not on file    Relationship status: Not on file  Other Topics Concern  . Not on file  Social History Narrative  . Not on file   Outpatient Encounter Medications as of 09/26/2017   Medication Sig  . metFORMIN (GLUCOPHAGE) 1000 MG tablet Take 1,000 mg by mouth 2 (two) times daily with a meal.  . amLODipine (NORVASC) 10 MG tablet Take 10 mg by mouth every morning.  Marland Kitchen LANTUS SOLOSTAR 100 UNIT/ML Solostar Pen Inject 40 Units into the skin every evening.  Marland Kitchen lisinopril-hydrochlorothiazide (PRINZIDE,ZESTORETIC) 20-12.5 MG tablet Take 1 tablet by mouth every morning.  . ondansetron (ZOFRAN-ODT) 4 MG disintegrating tablet Take 1 tablet (4 mg total) by mouth every 8 (eight) hours as needed for nausea or vomiting.  . [DISCONTINUED] metFORMIN (GLUCOPHAGE) 500 MG tablet Take 1 tablet (500 mg total) by mouth 2 (two) times daily with a meal. (Patient taking differently: Take 1,000 mg by mouth 2 (two) times daily with a meal. )   No facility-administered encounter medications on file as of 09/26/2017.     ALLERGIES: Allergies  Allergen Reactions  . Ranitidine Hcl  Rash    VACCINATION STATUS:  There is no immunization history on file for this patient.  Diabetes  She presents for her initial diabetic visit. She has type 2 diabetes mellitus. Onset time: She was diagnosed at approximate age of 82 years, preceded by gestational diabetes with the last 2 of her 5 pregnancies. Her disease course has been worsening. There are no hypoglycemic associated symptoms. Pertinent negatives for hypoglycemia include no confusion, headaches, pallor or seizures. Associated symptoms include blurred vision, fatigue, foot paresthesias, polydipsia and polyuria. Pertinent negatives for diabetes include no chest pain and no polyphagia. There are no hypoglycemic complications. Symptoms are worsening. There are no diabetic complications. Risk factors for coronary artery disease include diabetes mellitus, dyslipidemia, family history, obesity, hypertension and sedentary lifestyle. Current diabetic treatments: She is currently on metformin 1000 mg twice daily, Lantus 30 units nightly. Her weight is increasing  steadily. She is following a generally unhealthy diet. When asked about meal planning, she reported none. She has not had a previous visit with a dietitian. She never participates in exercise. (She did not bring any meter nor logs to review with her today.  Her A1c was 12.8% on May 05, 2017.) An ACE inhibitor/angiotensin II receptor blocker is being taken. She does not see a podiatrist.Eye exam is not current.  Hypertension  This is a chronic problem. The current episode started more than 1 year ago. The problem is controlled. Associated symptoms include blurred vision. Pertinent negatives include no chest pain, headaches, palpitations or shortness of breath. Risk factors for coronary artery disease include family history, dyslipidemia, obesity, sedentary lifestyle and diabetes mellitus. Past treatments include ACE inhibitors and calcium channel blockers.      Review of Systems  Constitutional: Positive for fatigue. Negative for chills, fever and unexpected weight change.  HENT: Negative for trouble swallowing and voice change.   Eyes: Positive for blurred vision. Negative for visual disturbance.  Respiratory: Negative for cough, shortness of breath and wheezing.   Cardiovascular: Negative for chest pain, palpitations and leg swelling.  Gastrointestinal: Negative for diarrhea, nausea and vomiting.  Endocrine: Positive for polydipsia and polyuria. Negative for cold intolerance, heat intolerance and polyphagia.  Musculoskeletal: Negative for arthralgias and myalgias.  Skin: Negative for color change, pallor, rash and wound.  Neurological: Negative for seizures and headaches.  Psychiatric/Behavioral: Negative for confusion and suicidal ideas.    Objective:    BP 122/82   Pulse 94   Ht 5' (1.524 m)   Wt 192 lb (87.1 kg)   LMP 09/12/2017   SpO2 98%   BMI 37.50 kg/m   Wt Readings from Last 3 Encounters:  09/26/17 192 lb (87.1 kg)  09/25/17 192 lb (87.1 kg)  07/02/17 183 lb (83 kg)      Physical Exam  Constitutional: She is oriented to person, place, and time. She appears well-developed.  HENT:  Head: Normocephalic and atraumatic.  Eyes: EOM are normal.  Neck: Normal range of motion. Neck supple. No tracheal deviation present. No thyromegaly present.  Cardiovascular: Normal rate and regular rhythm.  Pulmonary/Chest: Effort normal and breath sounds normal.  Abdominal: Soft. Bowel sounds are normal. There is no tenderness. There is no guarding.  Musculoskeletal: Normal range of motion. She exhibits no edema.  Neurological: She is alert and oriented to person, place, and time. She has normal reflexes. No cranial nerve deficit. Coordination normal.  Skin: Skin is warm and dry. No rash noted. No erythema. No pallor.  Psychiatric: She has a normal mood and  affect. Judgment normal.      CMP ( most recent) CMP     Component Value Date/Time   NA 137 09/25/2017 1734   K 4.1 09/25/2017 1734   CL 102 09/25/2017 1734   CO2 25 09/25/2017 1734   GLUCOSE 174 (H) 09/25/2017 1734   BUN 16 09/25/2017 1734   BUN 11 05/05/2017   CREATININE 0.77 09/25/2017 1734   CREATININE 0.57 01/07/2013 1152   CALCIUM 9.6 09/25/2017 1734   PROT 7.1 08/11/2015 2100   ALBUMIN 3.5 08/11/2015 2100   AST 22 08/11/2015 2100   ALT 16 08/11/2015 2100   ALKPHOS 76 08/11/2015 2100   BILITOT 0.7 08/11/2015 2100   GFRNONAA >60 09/25/2017 1734   GFRAA >60 09/25/2017 1734     Diabetic Labs (most recent): Lab Results  Component Value Date   HGBA1C 12.8 05/05/2017     Lipid Panel ( most recent) Lipid Panel     Component Value Date/Time   CHOL 124 05/05/2017   TRIG 52 05/05/2017   HDL 49 05/05/2017   LDLCALC 62 05/05/2017       Assessment & Plan:   1. Uncontrolled type 2 diabetes mellitus with hyperglycemia ([place])  - Ana [name] has currently uncontrolled symptomatic type 2 DM since 47 years of age,  with most recent A1c of 12.8 % from March 2019. Recent labs reviewed.  -her  diabetes is complicated by obesity/sedentary life and Ana [name] remains at a high risk for more acute and chronic complications which include CAD, CVA, CKD, retinopathy, and neuropathy. These are all discussed in detail with the patient.  - I have counseled her on diet management and weight loss, by adopting a carbohydrate restricted/protein rich diet.  - Suggestion is made for her to avoid simple carbohydrates  from her diet including Cakes, Sweet Desserts, Ice Cream, Soda (diet and regular), Sweet Tea, Candies, Chips, Cookies, Store Bought Juices, Alcohol in Excess of  1-2 drinks a day, Artificial Sweeteners, and "Sugar-free" Products. This will help patient to have stable blood glucose profile and potentially avoid unintended weight gain.  - I encouraged her to switch to  unprocessed or minimally processed complex starch and increased protein intake (animal or plant source), fruits, and vegetables.  - she is advised to stick to a routine mealtimes to eat 3 meals  a day and avoid unnecessary snacks ( to snack only to correct hypoglycemia).   - she will be scheduled with Jearld Fenton, RDN, CDE for individualized diabetes education.  - I have approached her with the following individualized plan to manage diabetes and patient agrees:   -Based on her prevailing glycemic burden, she may require intensive treatment with basal/bolus insulin.   -In preparation, I advised her to start monitoring blood glucose 4 times a day-before meals and at bedtime and return in 1 week with her meter and logs for reevaluation.    -In the meantime, I advised her to increase her Lantus to 40 units nightly, continue metformin 1000 mg p.o. twice daily-therapeutically suitable for patient.  Side effects and precautions discussed with her. - Patient is warned not to take insulin without proper monitoring per orders. -Patient is encouraged to call clinic for blood glucose levels less than 70 or above 300 mg  /dl.  -Be considered for CMP, thyroid function test, and A1c after her next visit.  - she will be considered for incretin therapy as appropriate next visit. - Patient specific target  A1c;  LDL, HDL, Triglycerides, and  Waist Circumference were discussed in detail.  2) BP/HTN: Her blood pressure is controlled to target.  She is advised to continue on her current medications including amlodipine 10 mg p.o. daily, lisinopril/hydrochlorothiazide 20/12.5 mg p.o. Daily.  3) Lipids/HPL: Her recent lipid panel showed controlled LDL at 62.  Patient is not on statins.    4)  Weight/Diet: CDE Consult will be initiated , exercise, and detailed carbohydrates information provided.  5) Chronic Care/Health Maintenance:  -she  is on ACEI  is encouraged to initiate and continue to follow up with Ophthalmology, Dentist,  Podiatrist at least yearly or according to recommendations, and advised to  stay away from smoking. I have recommended yearly flu vaccine and pneumonia vaccine at least every 5 years; moderate intensity exercise for up to 150 minutes weekly; and  sleep for at least 7 hours a day.  - I advised patient to maintain close follow up with Rosita Fire, MD for primary care needs.  - Time spent with the patient: 45 minutes, of which >50% was spent in obtaining information about her symptoms, reviewing her previous labs, evaluations, and treatments, counseling her about her currently uncontrolled type 2 diabetes, hypertension, obesity/sedentary life, and developing developing  plans for long term treatment based on the latest recommendations.  Ana [name] participated in the discussions, expressed understanding, and voiced agreement with the above plans.  All questions were answered to her satisfaction. she is encouraged to contact clinic should she have any questions or concerns prior to her return visit.  Follow up plan: - Return in about 1 week (around 10/03/2017), or request for her labs/a1c from  Dr. Legrand Rams office, for follow up with meter and logs- no labs.  Glade Lloyd, MD [facility] [address] [place], [place] [address] Phone: [phone]  Fax: [phone]    09/26/2017, 2:20 PM  This note was partially dictated with voice recognition software. Similar sounding words can be transcribed inadequately or may not  be corrected upon review.

## 2017-09-26 NOTE — Patient Instructions (Signed)

## 2017-10-10 ENCOUNTER — Ambulatory Visit: Payer: Medicaid Other | Admitting: "Endocrinology

## 2017-10-11 ENCOUNTER — Encounter: Payer: Self-pay | Admitting: "Endocrinology

## 2017-11-27 DIAGNOSIS — E1165 Type 2 diabetes mellitus with hyperglycemia: Secondary | ICD-10-CM | POA: Diagnosis not present

## 2017-11-27 DIAGNOSIS — Z6833 Body mass index (BMI) 33.0-33.9, adult: Secondary | ICD-10-CM | POA: Diagnosis not present

## 2017-11-27 DIAGNOSIS — Z6831 Body mass index (BMI) 31.0-31.9, adult: Secondary | ICD-10-CM | POA: Diagnosis not present

## 2017-11-27 DIAGNOSIS — I10 Essential (primary) hypertension: Secondary | ICD-10-CM | POA: Diagnosis not present

## 2017-11-27 DIAGNOSIS — E1142 Type 2 diabetes mellitus with diabetic polyneuropathy: Secondary | ICD-10-CM | POA: Diagnosis not present

## 2017-11-27 DIAGNOSIS — E1140 Type 2 diabetes mellitus with diabetic neuropathy, unspecified: Secondary | ICD-10-CM | POA: Diagnosis not present

## 2018-01-14 ENCOUNTER — Other Ambulatory Visit: Payer: Self-pay

## 2018-01-14 ENCOUNTER — Emergency Department (HOSPITAL_COMMUNITY)
Admission: EM | Admit: 2018-01-14 | Discharge: 2018-01-14 | Disposition: A | Discharge status: Home / Self Care | Payer: Medicaid Other | Attending: Emergency Medicine | Admitting: Emergency Medicine

## 2018-01-14 ENCOUNTER — Encounter (HOSPITAL_COMMUNITY): Payer: Self-pay | Admitting: Emergency Medicine

## 2018-01-14 ENCOUNTER — Emergency Department (HOSPITAL_COMMUNITY): Payer: Medicaid Other

## 2018-01-14 DIAGNOSIS — I10 Essential (primary) hypertension: Secondary | ICD-10-CM | POA: Diagnosis not present

## 2018-01-14 DIAGNOSIS — S46911A Strain of unspecified muscle, fascia and tendon at shoulder and upper arm level, right arm, initial encounter: Secondary | ICD-10-CM | POA: Diagnosis not present

## 2018-01-14 DIAGNOSIS — E119 Type 2 diabetes mellitus without complications: Secondary | ICD-10-CM | POA: Diagnosis not present

## 2018-01-14 DIAGNOSIS — Y929 Unspecified place or not applicable: Secondary | ICD-10-CM | POA: Diagnosis not present

## 2018-01-14 DIAGNOSIS — Z7984 Long term (current) use of oral hypoglycemic drugs: Secondary | ICD-10-CM | POA: Diagnosis not present

## 2018-01-14 DIAGNOSIS — Y9389 Activity, other specified: Secondary | ICD-10-CM | POA: Insufficient documentation

## 2018-01-14 DIAGNOSIS — Z79899 Other long term (current) drug therapy: Secondary | ICD-10-CM | POA: Diagnosis not present

## 2018-01-14 DIAGNOSIS — X503XXA Overexertion from repetitive movements, initial encounter: Secondary | ICD-10-CM | POA: Diagnosis not present

## 2018-01-14 DIAGNOSIS — Y992 Volunteer activity: Secondary | ICD-10-CM | POA: Insufficient documentation

## 2018-01-14 DIAGNOSIS — S4991XA Unspecified injury of right shoulder and upper arm, initial encounter: Secondary | ICD-10-CM | POA: Diagnosis present

## 2018-01-14 DIAGNOSIS — M25511 Pain in right shoulder: Secondary | ICD-10-CM | POA: Diagnosis not present

## 2018-01-14 MED ORDER — IBUPROFEN 400 MG PO TABS
400.0000 mg | ORAL_TABLET | Freq: Once | ORAL | Status: AC
  Administered 2018-01-14: 400 mg via ORAL
  Filled 2018-01-14: qty 1

## 2018-01-14 MED ORDER — HYDROCODONE-ACETAMINOPHEN 5-325 MG PO TABS: 1.0000 | ORAL_TABLET | ORAL | 0 refills | Status: DC | PRN

## 2018-01-14 MED ORDER — IBUPROFEN 600 MG PO TABS: 600.0000 mg | ORAL_TABLET | Freq: Four times a day (QID) | ORAL | 0 refills | Status: DC | PRN

## 2018-01-14 NOTE — ED Notes (Addendum)
Patient transported to X-ray 

## 2018-01-14 NOTE — Discharge Instructions (Addendum)
As discussed I suspect you have strained your right shoulder with Saturdays activity.  You may use the sling for comfort, but do not completely rely on the sling, I need for you to maintain range of motion in your shoulder joint.  You may do this by performing small circles with the shoulder as demonstrated every couple of hours.  Use the medications as prescribed.  Do not drive within 4 hours of taking hydrocodone as this medication will make you drowsy.  I recommend a heating pad applied to your shoulder for 20 minutes several times daily.  See your doctor for recheck if not better over the next week.

## 2018-01-14 NOTE — ED Provider Notes (Signed)
[facility] EMERGENCY DEPARTMENT Provider Note   CSN: [id number] Arrival date & time: 01/14/18  1025     History   Chief Complaint Chief Complaint  Patient presents with  . Shoulder Pain    HPI Ana [name] is a 47 y.o. female.  The history is provided by the patient.  Shoulder Pain   This is a new problem. The current episode started 2 days ago. The problem occurs constantly. The problem has been gradually worsening. The pain is present in the right shoulder. The pain is at a severity of 10/10. The pain is severe. Associated symptoms include limited range of motion. Pertinent negatives include no numbness and no stiffness. She has tried arthritis medications (arthritis strength tylenol) for the symptoms. The treatment provided no relief. 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 Family history is significant for no rheumatoid arthritis and no gout.    Past Medical History:  Diagnosis Date  . Diabetes mellitus    diet controlled    Patient Active Problem List   Diagnosis Date Noted  . Uncontrolled type 2 diabetes mellitus with hyperglycemia ([place]) 09/26/2017  . Essential hypertension, benign 09/26/2017  . Mixed hyperlipidemia 09/26/2017  . Class 2 severe obesity due to excess calories with serious comorbidity and body mass index (BMI) of 37.0 to 37.9 in adult [facility]) 09/26/2017    Past Surgical History:  Procedure Laterality Date  . CESAREAN SECTION  x4  . CHOLECYSTECTOMY    . MASS EXCISION  10/27/2011   Procedure: EXCISION MASS;  Surgeon: Donato Heinz, MD;  Location: [facility];  Service: General;  Laterality: Right;  . TUBAL LIGATION       OB History    Gravida  6   Para  5   Term      Preterm  5   AB  1   Living        SAB  1   TAB      Ectopic      Multiple      Live Births               Home Medications    Prior to  Admission medications   Medication Sig Start Date End Date Taking? Authorizing Provider  amLODipine (NORVASC) 10 MG tablet Take 10 mg by mouth every morning. 08/31/17   [provider]  HYDROcodone-acetaminophen (NORCO/VICODIN) 5-325 MG tablet Take 1 tablet by mouth every 4 (four) hours as needed. 01/14/18   Evalee Jefferson, PA-C  ibuprofen (ADVIL,MOTRIN) 600 MG tablet Take 1 tablet (600 mg total) by mouth every 6 (six) hours as needed. 01/14/18   Evalee Jefferson, PA-C  LANTUS SOLOSTAR 100 UNIT/ML Solostar Pen Inject 40 Units into the skin every evening. 08/31/17   [provider]  lisinopril-hydrochlorothiazide (PRINZIDE,ZESTORETIC) 20-12.5 MG tablet Take 1 tablet by mouth every morning. 07/25/17   [provider]  metFORMIN (GLUCOPHAGE) 1000 MG tablet Take 1,000 mg by mouth 2 (two) times daily with a meal.    [provider]  ondansetron (ZOFRAN-ODT) 4 MG disintegrating tablet Take 1 tablet (4 mg total) by mouth every 8 (eight) hours as needed for nausea or vomiting. 09/25/17   Davonna Belling, MD    Family History Family History  Problem Relation Age of Onset  . Kidney failure Mother   . Diabetes Mother   .  Kidney disease Mother   . Kidney failure Father   . Kidney disease Father   . Kidney failure Brother   . Diabetes Brother     Social History Social History   Tobacco Use  . Smoking status: Never Smoker  . Smokeless tobacco: Never Used  Substance Use Topics  . Alcohol use: No  . Drug use: No     Allergies   Ranitidine hcl   Review of Systems Review of Systems  Constitutional: Negative for fever.  Musculoskeletal: Positive for arthralgias. Negative for joint swelling, myalgias and stiffness.  Neurological: Negative for weakness and numbness.     Physical Exam Updated Vital Signs BP 103/71 (BP Location: Left Arm)   Pulse (!) 102   Temp 98 F (36.7 C) (Oral)   Resp 12   Ht 5' (1.524 m)   Wt 83.9 kg   LMP 12/28/2017   SpO2 99%   BMI  36.13 kg/m   Physical Exam  Constitutional: She appears well-developed and well-nourished.  HENT:  Head: Atraumatic.  Neck: Normal range of motion.  Cardiovascular:  Pulses equal bilaterally  Musculoskeletal: She exhibits tenderness. She exhibits no edema.       Right shoulder: She exhibits bony tenderness. She exhibits no swelling, no effusion, no crepitus, no spasm and normal strength.  ttp at the right ac joint without edema or deformity.   Neurological: She is alert. She has normal strength. She displays normal reflexes. No sensory deficit.  Equal grip strength  Skin: Skin is warm and dry.  Keloid right upper back over scapula  Psychiatric: She has a normal mood and affect.     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 Initial encounter. EXAM: RIGHT SHOULDER - 2+ VIEW COMPARISON:  10/22/2010 plain film exam and 08/17/2015 CT. FINDINGS: No fracture or dislocation. Moderate to marked acromioclavicular joint degenerative changes with bony overgrowth. Mild glenohumeral joint degenerative changes. Suggestion of soft tissue mass posterior to scapula. Sclerotic appearance of bicipital groove of the humeral head unchanged. Visualized lungs clear. IMPRESSION: 1. Moderate to marked acromioclavicular joint degenerative changes with bony overgrowth. 2. Mild glenohumeral joint degenerative changes. 3. Suggestion of soft tissue mass (spanning over 7.8 cm) posterior to scapula. MR or CT can be obtained for further delineation as clinically indicated. Electronically Signed   By: Genia Del M.D.   On: 01/14/2018 11:43    Procedures Procedures (including critical care time)  Medications Ordered in ED Medications  ibuprofen  (ADVIL,MOTRIN) tablet 400 mg (400 mg Oral Given 01/14/18 1317)     Initial Impression / Assessment and Plan / ED Course  I have reviewed the triage vital signs and the nursing notes.  Pertinent labs & imaging results that were available during my care of the patient were reviewed by me and considered in my medical decision making (see chart for details).     Pt with advanced arthritis right AC.  Discussed with pt who does not recall specific injury which might explain these early deg changes.  Advised heat tx, activities as tolerated, sling provided but warned about maintaining ROM.  Ibuprofen, few hydrocodone. Plan recheck by ppc in 1 week if not improving.  [place] controlled substance database reviewed.   Final Clinical Impressions(s) / ED  Diagnoses   Final diagnoses:  Strain of right shoulder, initial encounter    ED Discharge Orders         Ordered    ibuprofen (ADVIL,MOTRIN) 600 MG tablet  Every 6 hours PRN     01/14/18 1325    HYDROcodone-acetaminophen (NORCO/VICODIN) 5-325 MG tablet  Every 4 hours PRN     01/14/18 1325           Evalee Jefferson, PA-C 01/14/18 2207    Milton Ferguson, MD 01/15/18 [phone]

## 2018-01-14 NOTE — ED Triage Notes (Signed)
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

## 2018-03-01 DIAGNOSIS — I10 Essential (primary) hypertension: Secondary | ICD-10-CM | POA: Diagnosis not present

## 2018-03-01 DIAGNOSIS — Z6833 Body mass index (BMI) 33.0-33.9, adult: Secondary | ICD-10-CM | POA: Diagnosis not present

## 2018-03-01 DIAGNOSIS — E1165 Type 2 diabetes mellitus with hyperglycemia: Secondary | ICD-10-CM | POA: Diagnosis not present

## 2018-03-01 DIAGNOSIS — E1140 Type 2 diabetes mellitus with diabetic neuropathy, unspecified: Secondary | ICD-10-CM | POA: Diagnosis not present

## 2018-05-31 DIAGNOSIS — E1142 Type 2 diabetes mellitus with diabetic polyneuropathy: Secondary | ICD-10-CM | POA: Diagnosis not present

## 2018-05-31 DIAGNOSIS — I10 Essential (primary) hypertension: Secondary | ICD-10-CM | POA: Diagnosis not present

## 2018-12-16 ENCOUNTER — Emergency Department (HOSPITAL_COMMUNITY): Admission: EM | Admit: 2018-12-16 | Discharge: 2018-12-16 | Discharge status: Absent without leave | Payer: Self-pay

## 2018-12-16 ENCOUNTER — Emergency Department (HOSPITAL_COMMUNITY)
Admission: EM | Admit: 2018-12-16 | Discharge: 2018-12-17 | Disposition: A | Discharge status: Home / Self Care | Payer: Medicaid Other | Attending: Emergency Medicine | Admitting: Emergency Medicine

## 2018-12-16 ENCOUNTER — Emergency Department (HOSPITAL_COMMUNITY): Payer: Medicaid Other

## 2018-12-16 ENCOUNTER — Other Ambulatory Visit: Payer: Self-pay

## 2018-12-16 ENCOUNTER — Encounter (HOSPITAL_COMMUNITY): Payer: Self-pay | Admitting: Emergency Medicine

## 2018-12-16 DIAGNOSIS — Z7984 Long term (current) use of oral hypoglycemic drugs: Secondary | ICD-10-CM | POA: Diagnosis not present

## 2018-12-16 DIAGNOSIS — R739 Hyperglycemia, unspecified: Secondary | ICD-10-CM

## 2018-12-16 DIAGNOSIS — E860 Dehydration: Secondary | ICD-10-CM

## 2018-12-16 DIAGNOSIS — Y9389 Activity, other specified: Secondary | ICD-10-CM | POA: Diagnosis not present

## 2018-12-16 DIAGNOSIS — Z79899 Other long term (current) drug therapy: Secondary | ICD-10-CM | POA: Insufficient documentation

## 2018-12-16 DIAGNOSIS — R079 Chest pain, unspecified: Secondary | ICD-10-CM | POA: Diagnosis not present

## 2018-12-16 DIAGNOSIS — S51811A Laceration without foreign body of right forearm, initial encounter: Secondary | ICD-10-CM | POA: Insufficient documentation

## 2018-12-16 DIAGNOSIS — Y92410 Unspecified street and highway as the place of occurrence of the external cause: Secondary | ICD-10-CM | POA: Insufficient documentation

## 2018-12-16 DIAGNOSIS — R1010 Upper abdominal pain, unspecified: Secondary | ICD-10-CM | POA: Diagnosis not present

## 2018-12-16 DIAGNOSIS — E119 Type 2 diabetes mellitus without complications: Secondary | ICD-10-CM | POA: Insufficient documentation

## 2018-12-16 DIAGNOSIS — R911 Solitary pulmonary nodule: Secondary | ICD-10-CM

## 2018-12-16 DIAGNOSIS — I10 Essential (primary) hypertension: Secondary | ICD-10-CM | POA: Insufficient documentation

## 2018-12-16 DIAGNOSIS — Y999 Unspecified external cause status: Secondary | ICD-10-CM | POA: Insufficient documentation

## 2018-12-16 DIAGNOSIS — E0789 Other specified disorders of thyroid: Secondary | ICD-10-CM

## 2018-12-16 DIAGNOSIS — E079 Disorder of thyroid, unspecified: Secondary | ICD-10-CM

## 2018-12-16 DIAGNOSIS — S299XXA Unspecified injury of thorax, initial encounter: Secondary | ICD-10-CM | POA: Diagnosis not present

## 2018-12-16 DIAGNOSIS — S3993XA Unspecified injury of pelvis, initial encounter: Secondary | ICD-10-CM | POA: Diagnosis not present

## 2018-12-16 DIAGNOSIS — E1165 Type 2 diabetes mellitus with hyperglycemia: Secondary | ICD-10-CM | POA: Diagnosis not present

## 2018-12-16 DIAGNOSIS — S3991XA Unspecified injury of abdomen, initial encounter: Secondary | ICD-10-CM | POA: Diagnosis not present

## 2018-12-16 LAB — CBC
HCT: 42.3 % (ref 36.0–46.0)
Hemoglobin: 13.8 g/dL (ref 12.0–15.0)
MCH: 28.9 pg (ref 26.0–34.0)
MCHC: 32.6 g/dL (ref 30.0–36.0)
MCV: 88.7 fL (ref 80.0–100.0)
Platelets: 327 10*3/uL (ref 150–400)
RBC: 4.77 MIL/uL (ref 3.87–5.11)
RDW: 12.9 % (ref 11.5–15.5)
WBC: 11.1 10*3/uL — ABNORMAL HIGH (ref 4.0–10.5)
nRBC: 0 % (ref 0.0–0.2)

## 2018-12-16 LAB — COMPREHENSIVE METABOLIC PANEL
ALT: 24 U/L (ref 0–44)
AST: 31 U/L (ref 15–41)
Albumin: 3.6 g/dL (ref 3.5–5.0)
Alkaline Phosphatase: 118 U/L (ref 38–126)
Anion gap: 16 — ABNORMAL HIGH (ref 5–15)
BUN: 13 mg/dL (ref 6–20)
CO2: 19 mmol/L — ABNORMAL LOW (ref 22–32)
Calcium: 9.5 mg/dL (ref 8.9–10.3)
Chloride: 99 mmol/L (ref 98–111)
Creatinine, Ser: 0.93 mg/dL (ref 0.44–1.00)
GFR calc Af Amer: >60 mL/min (ref >60)
GFR calc non Af Amer: >60 mL/min (ref >60)
Glucose, Bld: 407 mg/dL — ABNORMAL HIGH (ref 70–99)
Potassium: 3.8 mmol/L (ref 3.5–5.1)
Sodium: 134 mmol/L — ABNORMAL LOW (ref 135–145)
Total Bilirubin: 0.5 mg/dL (ref 0.3–1.2)
Total Protein: 8 g/dL (ref 6.5–8.1)

## 2018-12-16 MED ORDER — SODIUM CHLORIDE 0.9 % IV BOLUS
1000.0000 mL | Freq: Once | INTRAVENOUS | Status: AC
  Administered 2018-12-16: 1000 mL via INTRAVENOUS

## 2018-12-16 MED ORDER — MORPHINE SULFATE (PF) 4 MG/ML IV SOLN
4.0000 mg | Freq: Once | INTRAVENOUS | Status: AC
  Administered 2018-12-16: 4 mg via INTRAVENOUS
  Filled 2018-12-16: qty 1

## 2018-12-16 MED ORDER — IOHEXOL 300 MG/ML  SOLN
100.0000 mL | Freq: Once | INTRAMUSCULAR | Status: AC | PRN
  Administered 2018-12-16: 23:00:00 100 mL via INTRAVENOUS

## 2018-12-16 MED ORDER — INSULIN ASPART 100 UNIT/ML ~~LOC~~ SOLN
8.0000 [IU] | Freq: Once | SUBCUTANEOUS | Status: AC
  Administered 2018-12-16: 8 [IU] via SUBCUTANEOUS
  Filled 2018-12-16: qty 1
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
## 2018-12-17 DIAGNOSIS — R918 Other nonspecific abnormal finding of lung field: Secondary | ICD-10-CM | POA: Diagnosis not present

## 2018-12-17 DIAGNOSIS — S20211A Contusion of right front wall of thorax, initial encounter: Secondary | ICD-10-CM | POA: Diagnosis not present

## 2018-12-17 DIAGNOSIS — R5381 Other malaise: Secondary | ICD-10-CM | POA: Diagnosis not present

## 2018-12-17 DIAGNOSIS — Z888 Allergy status to other drugs, medicaments and biological substances status: Secondary | ICD-10-CM | POA: Diagnosis not present

## 2018-12-17 DIAGNOSIS — Z794 Long term (current) use of insulin: Secondary | ICD-10-CM | POA: Diagnosis not present

## 2018-12-17 DIAGNOSIS — S299XXA Unspecified injury of thorax, initial encounter: Secondary | ICD-10-CM | POA: Diagnosis not present

## 2018-12-17 DIAGNOSIS — R0781 Pleurodynia: Secondary | ICD-10-CM | POA: Diagnosis not present

## 2018-12-17 DIAGNOSIS — R52 Pain, unspecified: Secondary | ICD-10-CM | POA: Diagnosis not present

## 2018-12-17 DIAGNOSIS — R509 Fever, unspecified: Secondary | ICD-10-CM | POA: Diagnosis not present

## 2018-12-17 DIAGNOSIS — E119 Type 2 diabetes mellitus without complications: Secondary | ICD-10-CM | POA: Diagnosis not present

## 2018-12-17 DIAGNOSIS — R609 Edema, unspecified: Secondary | ICD-10-CM | POA: Diagnosis not present

## 2018-12-17 DIAGNOSIS — N390 Urinary tract infection, site not specified: Secondary | ICD-10-CM | POA: Diagnosis not present

## 2018-12-17 LAB — BASIC METABOLIC PANEL
Anion gap: 10 (ref 5–15)
BUN: 12 mg/dL (ref 6–20)
CO2: 23 mmol/L (ref 22–32)
Calcium: 8.9 mg/dL (ref 8.9–10.3)
Chloride: 102 mmol/L (ref 98–111)
Creatinine, Ser: 0.86 mg/dL (ref 0.44–1.00)
GFR calc Af Amer: >60 mL/min (ref >60)
GFR calc non Af Amer: >60 mL/min (ref >60)
Glucose, Bld: 293 mg/dL — ABNORMAL HIGH (ref 70–99)
Potassium: 3.9 mmol/L (ref 3.5–5.1)
Sodium: 135 mmol/L (ref 135–145)

## 2018-12-17 MED ORDER — METHOCARBAMOL 500 MG PO TABS: 500.0000 mg | ORAL_TABLET | Freq: Every evening | ORAL | 0 refills | Status: DC | PRN

## 2018-12-17 MED ORDER — HYDROCODONE-ACETAMINOPHEN 5-325 MG PO TABS
1.0000 | ORAL_TABLET | Freq: Once | ORAL | Status: AC
  Administered 2018-12-17: 03:00:00 1 via ORAL
  Filled 2018-12-17: qty 1

## 2018-12-17 MED ORDER — FENTANYL CITRATE (PF) 100 MCG/2ML IJ SOLN: 100.0000 ug | Freq: Once | INTRAMUSCULAR | Status: DC

## 2018-12-17 NOTE — Discharge Instructions (Addendum)
Take ibuprofen 3 times a day with meals.  Do not take other anti-inflammatories at the same time (Advil, Motrin, naproxen, Aleve). You may supplement with Tylenol if you need further pain control. Use robaxin as needed for muscle stiffness or soreness.  Have caution, this may make you tired or groggy.  Do not drive or operate heavy machinery while taking this medicine. Use ice packs or heating pads if this helps control your pain. You will likely have continued muscle stiffness and soreness over the next couple days.   Follow-up with primary care in 1 week for recheck of your symptoms and your lab work. Make sure you are taking your diabetes medicine as prescribed. Return to the emergency room if you develop vision changes, vomiting, slurred speech, numbness, loss of bowel or bladder control, or any new or worsening symptoms.
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
## 2018-12-26 ENCOUNTER — Other Ambulatory Visit: Payer: Self-pay

## 2018-12-26 ENCOUNTER — Ambulatory Visit (HOSPITAL_COMMUNITY)
Admission: RE | Admit: 2018-12-26 | Discharge: 2018-12-26 | Disposition: A | Discharge status: Home / Self Care | Payer: Medicaid Other | Source: Ambulatory Visit | Attending: Internal Medicine | Admitting: Internal Medicine

## 2018-12-26 ENCOUNTER — Other Ambulatory Visit (HOSPITAL_COMMUNITY): Payer: Self-pay | Admitting: Internal Medicine

## 2018-12-26 DIAGNOSIS — R0789 Other chest pain: Secondary | ICD-10-CM | POA: Insufficient documentation

## 2018-12-26 DIAGNOSIS — E1165 Type 2 diabetes mellitus with hyperglycemia: Secondary | ICD-10-CM | POA: Diagnosis not present

## 2019-01-10 DIAGNOSIS — H524 Presbyopia: Secondary | ICD-10-CM | POA: Diagnosis not present

## 2019-01-10 DIAGNOSIS — H52222 Regular astigmatism, left eye: Secondary | ICD-10-CM | POA: Diagnosis not present

## 2019-01-10 DIAGNOSIS — H5203 Hypermetropia, bilateral: Secondary | ICD-10-CM | POA: Diagnosis not present

## 2019-01-15 DIAGNOSIS — E1142 Type 2 diabetes mellitus with diabetic polyneuropathy: Secondary | ICD-10-CM | POA: Diagnosis not present

## 2019-01-15 DIAGNOSIS — Z0001 Encounter for general adult medical examination with abnormal findings: Secondary | ICD-10-CM | POA: Diagnosis not present

## 2019-01-15 DIAGNOSIS — Z6832 Body mass index (BMI) 32.0-32.9, adult: Secondary | ICD-10-CM | POA: Diagnosis not present

## 2019-01-15 DIAGNOSIS — I10 Essential (primary) hypertension: Secondary | ICD-10-CM | POA: Diagnosis not present

## 2019-01-21 DIAGNOSIS — H5213 Myopia, bilateral: Secondary | ICD-10-CM | POA: Diagnosis not present

## 2019-02-17 DIAGNOSIS — H52222 Regular astigmatism, left eye: Secondary | ICD-10-CM | POA: Diagnosis not present

## 2019-02-17 DIAGNOSIS — H524 Presbyopia: Secondary | ICD-10-CM | POA: Diagnosis not present

## 2019-08-19 DIAGNOSIS — H2513 Age-related nuclear cataract, bilateral: Secondary | ICD-10-CM | POA: Diagnosis not present

## 2019-08-19 DIAGNOSIS — E113523 Type 2 diabetes mellitus with proliferative diabetic retinopathy with traction retinal detachment involving the macula, bilateral: Secondary | ICD-10-CM | POA: Diagnosis not present

## 2019-08-19 DIAGNOSIS — H4311 Vitreous hemorrhage, right eye: Secondary | ICD-10-CM | POA: Diagnosis not present

## 2019-09-04 DIAGNOSIS — H35373 Puckering of macula, bilateral: Secondary | ICD-10-CM | POA: Diagnosis not present

## 2019-09-04 DIAGNOSIS — H4311 Vitreous hemorrhage, right eye: Secondary | ICD-10-CM | POA: Diagnosis not present

## 2019-09-04 DIAGNOSIS — E113522 Type 2 diabetes mellitus with proliferative diabetic retinopathy with traction retinal detachment involving the macula, left eye: Secondary | ICD-10-CM | POA: Diagnosis not present

## 2019-09-04 DIAGNOSIS — E113532 Type 2 diabetes mellitus with proliferative diabetic retinopathy with traction retinal detachment not involving the macula, left eye: Secondary | ICD-10-CM | POA: Diagnosis not present

## 2019-09-09 DIAGNOSIS — H2513 Age-related nuclear cataract, bilateral: Secondary | ICD-10-CM | POA: Diagnosis not present

## 2019-09-17 ENCOUNTER — Other Ambulatory Visit: Payer: Self-pay

## 2019-09-17 ENCOUNTER — Encounter (INDEPENDENT_AMBULATORY_CARE_PROVIDER_SITE_OTHER): Payer: Self-pay | Admitting: Internal Medicine

## 2019-09-17 ENCOUNTER — Ambulatory Visit (INDEPENDENT_AMBULATORY_CARE_PROVIDER_SITE_OTHER): Payer: Medicaid Other | Admitting: Internal Medicine

## 2019-09-17 VITALS — BP 146/90 | HR 87 | Temp 97.5°F | Resp 18 | Ht 62.0 in | Wt 154.0 lb

## 2019-09-17 DIAGNOSIS — E559 Vitamin D deficiency, unspecified: Secondary | ICD-10-CM | POA: Diagnosis not present

## 2019-09-17 DIAGNOSIS — R5383 Other fatigue: Secondary | ICD-10-CM | POA: Diagnosis not present

## 2019-09-17 DIAGNOSIS — E1165 Type 2 diabetes mellitus with hyperglycemia: Secondary | ICD-10-CM | POA: Diagnosis not present

## 2019-09-17 DIAGNOSIS — E782 Mixed hyperlipidemia: Secondary | ICD-10-CM

## 2019-09-17 DIAGNOSIS — R5381 Other malaise: Secondary | ICD-10-CM

## 2019-09-17 DIAGNOSIS — I10 Essential (primary) hypertension: Secondary | ICD-10-CM | POA: Diagnosis not present

## 2019-09-17 MED ORDER — LOSARTAN POTASSIUM 50 MG PO TABS
50.0000 mg | ORAL_TABLET | Freq: Every day | ORAL | 3 refills | Status: DC
Start: 2019-09-17 — End: 2019-10-06

## 2019-09-17 NOTE — Progress Notes (Signed)
Metrics: Intervention Frequency ACO  Documented Smoking Status Yearly  Screened one or more times in 24 months  Cessation Counseling or  Active cessation medication Past 24 months  Past 24 months   Guideline developer: UpToDate (See UpToDate for funding source) Date Released: 2014       Wellness Office Visit  Subjective:  Patient ID: Ana [name], female    DOB: Feb 21, 1971  Age: 49 y.o. MRN: [id number]  CC: This 49 year old lady comes to our practice as a new patient to establish care. HPI  She was not happy with her previous primary care physician. She has type 2 diabetes and did see an endocrinologist in 2019.  Has since not seen anyone.  She has not been taking any of her medications for at least a month. She has also hypertension.  Thankfully, she has no history of coronary artery disease or cerebrovascular disease. Past Medical History:  Diagnosis Date  . Diabetes mellitus    diet controlled  . Hypertension    Past Surgical History:  Procedure Laterality Date  . CESAREAN SECTION  x4  . CHOLECYSTECTOMY    . MASS EXCISION  10/27/2011   Procedure: EXCISION MASS;  Surgeon: Donato Heinz, MD;  Location: [facility];  Service: General;  Laterality: Right;  . TUBAL LIGATION       Family History  Problem Relation Age of Onset  . Kidney failure Mother   . Diabetes Mother   . Kidney disease Mother   . Kidney failure Father   . Kidney disease Father   . Kidney failure Brother   . Diabetes Brother   . Diabetes Brother     Social History   Social History Narrative   Divorced,married for 8 years.Lives with daughter and 3 grandkids.Unemployed.   Social History   Tobacco Use  . Smoking status: Never Smoker  . Smokeless tobacco: Never Used  Substance Use Topics  . Alcohol use: Yes    Comment: occ    Current Meds  Medication Sig  . [DISCONTINUED] amLODipine (NORVASC) 10 MG tablet Take 10 mg by mouth every morning.  . [DISCONTINUED] ibuprofen (ADVIL,MOTRIN) 600 MG  tablet Take 1 tablet (600 mg total) by mouth every 6 (six) hours as needed.  . [DISCONTINUED] LANTUS SOLOSTAR 100 UNIT/ML Solostar Pen Inject 40 Units into the skin every evening.  . [DISCONTINUED] metFORMIN (GLUCOPHAGE) 1000 MG tablet Take 1,000 mg by mouth 2 (two) times daily with a meal.       Depression screen [facility] 2/9 09/17/2019  Decreased Interest 3  Down, Depressed, Hopeless 0  PHQ - 2 Score 3  Altered sleeping 0  Tired, decreased energy 0  Change in appetite 0  Feeling bad or failure about yourself  1  Trouble concentrating 2  Moving slowly or fidgety/restless 0  Suicidal thoughts 0  PHQ-9 Score 6  Difficult doing work/chores Not difficult at all     Objective:   Today's Vitals: BP (!) 146/90 (BP Location: Right Arm, Patient Position: Sitting, Cuff Size: Normal)   Pulse 87   Temp (!) 97.5 F (36.4 C) (Temporal)   Resp 18   Ht 5\' 2"  (1.575 m)   Wt 154 lb (69.9 kg)   BMI 28.17 kg/m  Vitals with BMI 09/17/2019 12/17/2018 12/16/2018  Height 5\' 2"  - -  Weight 154 lbs - -  BMI 00.93 - -  Systolic 818 299 371  Diastolic 90 66 99  Pulse 87 93 106     Physical Exam  She  looks systemically well.  She is overweight.  She is hypertensive.  She is alert and orientated without any focal neurological signs.     Assessment   1. Essential hypertension, benign   2. Uncontrolled type 2 diabetes mellitus with hyperglycemia ([place])   3. Mixed hyperlipidemia   4. Malaise and fatigue   5. Vitamin D deficiency disease       Tests ordered Orders Placed This Encounter  Procedures  . CBC  . COMPLETE METABOLIC PANEL WITH GFR  . Hemoglobin A1c  . Lipid panel  . T3, free  . T4  . TSH  . VITAMIN D 25 Hydroxy (Vit-D Deficiency, Fractures)     Plan: 1. Blood work is ordered. 2. Her hypertension needs to be better controlled and I will start her on losartan. 3. I will see what the blood work shows in terms of control of her diabetes but previously she had uncontrolled  diabetes.  We may need to start her back on Metformin or another medication. 4. Follow-up in about a month's time to see how she is doing and we will start discussing nutrition at that time.   Meds ordered this encounter  Medications  . losartan (COZAAR) 50 MG tablet    Sig: Take 1 tablet (50 mg total) by mouth daily.    Dispense:  30 tablet    Refill:  3    Jenavie Stanczak Luther Parody, MD

## 2019-09-18 ENCOUNTER — Other Ambulatory Visit (INDEPENDENT_AMBULATORY_CARE_PROVIDER_SITE_OTHER): Payer: Self-pay | Admitting: Internal Medicine

## 2019-09-18 LAB — CBC
HCT: 38.6 % (ref 35.0–45.0)
Hemoglobin: 12.4 g/dL (ref 11.7–15.5)
MCH: 27.8 pg (ref 27.0–33.0)
MCHC: 32.1 g/dL (ref 32.0–36.0)
MCV: 86.5 fL (ref 80.0–100.0)
MPV: 10.7 fL (ref 7.5–12.5)
Platelets: 371 10*3/uL (ref 140–400)
RBC: 4.46 10*6/uL (ref 3.80–5.10)
RDW: 14.4 % (ref 11.0–15.0)
WBC: 6.2 10*3/uL (ref 3.8–10.8)

## 2019-09-18 LAB — COMPLETE METABOLIC PANEL WITH GFR
AG Ratio: 0.9 (calc) — ABNORMAL LOW (ref 1.0–2.5)
ALT: 15 U/L (ref 6–29)
AST: 24 U/L (ref 10–35)
Albumin: 3.4 g/dL — ABNORMAL LOW (ref 3.6–5.1)
Alkaline phosphatase (APISO): 238 U/L — ABNORMAL HIGH (ref 31–125)
BUN/Creatinine Ratio: 13 (calc) (ref 6–22)
BUN: 20 mg/dL (ref 7–25)
CO2: 26 mmol/L (ref 20–32)
Calcium: 9.2 mg/dL (ref 8.6–10.2)
Chloride: 100 mmol/L (ref 98–110)
Creat: 1.57 mg/dL — ABNORMAL HIGH (ref 0.50–1.10)
GFR, Est African American: 44 mL/min/{1.73_m2} — ABNORMAL LOW (ref >=60)
GFR, Est Non African American: 38 mL/min/{1.73_m2} — ABNORMAL LOW (ref >=60)
Globulin: 3.8 g/dL (calc) — ABNORMAL HIGH (ref 1.9–3.7)
Glucose, Bld: 321 mg/dL — ABNORMAL HIGH (ref 65–99)
Potassium: 4.2 mmol/L (ref 3.5–5.3)
Sodium: 134 mmol/L — ABNORMAL LOW (ref 135–146)
Total Bilirubin: 0.4 mg/dL (ref 0.2–1.2)
Total Protein: 7.2 g/dL (ref 6.1–8.1)

## 2019-09-18 LAB — LIPID PANEL
Cholesterol: 232 mg/dL — ABNORMAL HIGH (ref <200)
HDL: 59 mg/dL (ref >=50)
LDL Cholesterol (Calc): 153 mg/dL (calc) — ABNORMAL HIGH
Non-HDL Cholesterol (Calc): 173 mg/dL (calc) — ABNORMAL HIGH (ref <130)
Total CHOL/HDL Ratio: 3.9 (calc) (ref <5.0)
Triglycerides: 96 mg/dL (ref <150)

## 2019-09-18 LAB — TSH: TSH: 2.13 mIU/L

## 2019-09-18 LAB — T3, FREE: T3, Free: 2.6 pg/mL (ref 2.3–4.2)

## 2019-09-18 LAB — HEMOGLOBIN A1C
Hgb A1c MFr Bld: 12.6 % of total Hgb — ABNORMAL HIGH (ref <5.7)
Mean Plasma Glucose: 315 (calc)
eAG (mmol/L): 17.4 (calc)

## 2019-09-18 LAB — T4: T4, Total: 7.5 ug/dL (ref 5.1–11.9)

## 2019-09-18 LAB — VITAMIN D 25 HYDROXY (VIT D DEFICIENCY, FRACTURES): Vit D, 25-Hydroxy: 8 ng/mL — ABNORMAL LOW (ref 30–100)

## 2019-09-18 MED ORDER — GLIPIZIDE 5 MG PO TABS: 5.0000 mg | ORAL_TABLET | Freq: Two times a day (BID) | ORAL | 3 refills | Status: DC

## 2019-09-18 NOTE — Progress Notes (Signed)
Please call this patient and tell her that she is dehydrated so she needs to drink 100 ounces of water every day.Her diabetes is very poorly controlled and I have sent a new prescription glipizide 5 mg twice a day to her [facility].Her vitamin D levels are extremely low so she needs to start taking vitamin D3 10,000 units daily over-the-counter.Follow-up as scheduled.

## 2019-10-01 ENCOUNTER — Ambulatory Visit
Admission: EM | Admit: 2019-10-01 | Discharge: 2019-10-01 | Disposition: A | Discharge status: Home / Self Care | Payer: Medicaid Other

## 2019-10-01 ENCOUNTER — Other Ambulatory Visit: Payer: Self-pay

## 2019-10-01 DIAGNOSIS — I10 Essential (primary) hypertension: Secondary | ICD-10-CM

## 2019-10-01 NOTE — Discharge Instructions (Addendum)
Please continue to monitor blood pressure at home and keep a log Eat a well balanced diet of fruits, vegetables and lean meats.  Avoid foods high in fat and salt Please begin check blood pressure and keeping a log Drink water.  At least half your body weight in ounces Exercise for at least 30 minutes daily Return or go to the ER if you experience any new or worsening symptoms such as: headache, vision changes, dizziness, lightheadedness, chest pain, shortness of breath, numbness or tingling in extremities, abdominal pain, changes in bowel or bladder habits, etc..Marland Kitchen

## 2019-10-01 NOTE — ED Provider Notes (Signed)
[place]   [id number] 10/01/19 Arrival Time: 0805   Chief Complaint  Patient presents with  . Hypertension     SUBJECTIVE: History from: patient.  Ana [name] is a 49 y.o. female who presents to the urgent care with a complaint of high blood pressure.  Reports she was supposed to have a cataract surgery today but was sent to the urgent care due to high BP.  Blood pressure at the ophthalmologist office was 236/116 as she did not take her blood pressure medication.  BP at the urgent care was 200/100 after taking losartan 50 mg 30 minutes ago.  Does have a PCP. Denies HA, vision changes, dizziness, lightheadedness, chest pain, shortness of breath, numbness or tingling in extremities, abdominal pain, changes in bowel or bladder habits.  .  ROS: As per HPI.  All other pertinent ROS negative.      Past Medical History:  Diagnosis Date  . Diabetes mellitus    diet controlled  . Hypertension    Past Surgical History:  Procedure Laterality Date  . CESAREAN SECTION  x4  . CHOLECYSTECTOMY    . MASS EXCISION  10/27/2011   Procedure: EXCISION MASS;  Surgeon: Donato Heinz, MD;  Location: [facility];  Service: General;  Laterality: Right;  . TUBAL LIGATION     Allergies  Allergen Reactions  . Ranitidine Hcl Rash   No current facility-administered medications on file prior to encounter.   Current Outpatient Medications on File Prior to Encounter  Medication Sig Dispense Refill  . glipiZIDE (GLUCOTROL) 5 MG tablet Take 1 tablet (5 mg total) by mouth 2 (two) times daily before a meal. 60 tablet 3  . losartan (COZAAR) 50 MG tablet Take 1 tablet (50 mg total) by mouth daily. 30 tablet 3   Social History   Socioeconomic History  . Marital status: Single    Spouse name: Not on file  . Number of children: Not on file  . Years of education: Not on file  . Highest education level: Not on file  Occupational History  . Not on file  Tobacco Use  . Smoking status: Never  Smoker  . Smokeless tobacco: Never Used  Substance and Sexual Activity  . Alcohol use: Yes    Comment: occ  . Drug use: No  . Sexual activity: Yes    Birth control/protection: Surgical  Other Topics Concern  . Not on file  Social History Narrative   Divorced,married for 8 years.Lives with daughter and 3 grandkids.Unemployed.   Social Determinants of Health   Financial Resource Strain:   . Difficulty of Paying Living Expenses:   Food Insecurity:   . Worried About Charity fundraiser in the Last Year:   . Arboriculturist in the Last Year:   Transportation Needs:   . Film/video editor (Medical):   Marland Kitchen Lack of Transportation (Non-Medical):   Physical Activity:   . Days of Exercise per Week:   . Minutes of Exercise per Session:   Stress:   . Feeling of Stress :   Social Connections:   . Frequency of Communication with Friends and Family:   . Frequency of Social Gatherings with Friends and Family:   . Attends Religious Services:   . Active Member of Clubs or Organizations:   . Attends Archivist Meetings:   Marland Kitchen Marital Status:   Intimate Partner Violence:   . Fear of Current or Ex-Partner:   . Emotionally Abused:   .  Physically Abused:   . Sexually Abused:    Family History  Problem Relation Age of Onset  . Kidney failure Mother   . Diabetes Mother   . Kidney disease Mother   . Kidney failure Father   . Kidney disease Father   . Kidney failure Brother   . Diabetes Brother   . Diabetes Brother     OBJECTIVE:  Vitals:   10/01/19 0815 10/01/19 0848  BP: (!) 200/100 (!) 191/105  Pulse: 83   Resp: 18   Temp: 97.7 F (36.5 C)   SpO2: 96%      Physical Exam Vitals and nursing note reviewed.  Constitutional:      General: She is not in acute distress.    Appearance: Normal appearance. She is normal weight. She is not ill-appearing, toxic-appearing or diaphoretic.  HENT:     Head: Normocephalic.  Neck:     Vascular: No carotid bruit.   Cardiovascular:     Rate and Rhythm: Normal rate and regular rhythm.     Pulses: Normal pulses.     Heart sounds: Normal heart sounds. No murmur heard.  No friction rub. No gallop.   Pulmonary:     Effort: Pulmonary effort is normal. No respiratory distress.     Breath sounds: Normal breath sounds. No stridor. No wheezing, rhonchi or rales.  Chest:     Chest wall: No tenderness.  Musculoskeletal:     Cervical back: Normal range of motion. No rigidity or tenderness.  Lymphadenopathy:     Cervical: No cervical adenopathy.  Neurological:     General: No focal deficit present.     Mental Status: She is alert.     Cranial Nerves: Cranial nerves are intact.     Sensory: Sensation is intact.     Motor: Motor function is intact.     Coordination: Coordination is intact.     Gait: Gait is intact.  Psychiatric:        Behavior: Behavior normal.        Thought Content: Thought content normal.      LABS:  No results found for this or any previous visit (from the past 24 hour(s)).   ASSESSMENT & PLAN:  1. Elevated blood pressure reading in office with diagnosis of hypertension     No orders of the defined types were placed in this encounter.  Patient is stable at discharge.  BP recheck before discharge was 191/105.  She was advised to follow-up with PCP.  To Take her medication as prescribed and to monitor BP at home and keep a log.  Discharge instructions Please continue to monitor blood pressure at home and keep a log Eat a well balanced diet of fruits, vegetables and lean meats.  Avoid foods high in fat and salt Please begin check blood pressure and keeping a log Drink water.  At least half your body weight in ounces Exercise for at least 30 minutes daily Return or go to the ER if you experience any new or worsening symptoms such as: headache, vision changes, dizziness, lightheadedness, chest pain, shortness of breath, numbness or tingling in extremities, abdominal pain,  changes in bowel or bladder habits, etc...    Reviewed expectations re: course of current medical issues. Questions answered. Outlined signs and symptoms indicating need for more acute intervention. Patient verbalized understanding. After Visit Summary given.     Note: This document was prepared using Dragon voice recognition software and may include unintentional dictation errors.  Emerson Monte, [place] 10/01/19 [phone]

## 2019-10-01 NOTE — ED Triage Notes (Signed)
Pt was supposed to have cataract surgery this am. pts BP was too high and surgery was canceled. Pt states she did not take medication prior to surgery.

## 2019-10-06 ENCOUNTER — Other Ambulatory Visit (INDEPENDENT_AMBULATORY_CARE_PROVIDER_SITE_OTHER): Payer: Self-pay | Admitting: Internal Medicine

## 2019-10-06 DIAGNOSIS — H2513 Age-related nuclear cataract, bilateral: Secondary | ICD-10-CM | POA: Diagnosis not present

## 2019-10-06 DIAGNOSIS — I10 Essential (primary) hypertension: Secondary | ICD-10-CM | POA: Diagnosis not present

## 2019-10-06 MED ORDER — LOSARTAN POTASSIUM 100 MG PO TABS
100.0000 mg | ORAL_TABLET | Freq: Every day | ORAL | 1 refills | Status: DC
Start: 2019-10-06 — End: 2020-01-12

## 2019-10-13 ENCOUNTER — Telehealth (INDEPENDENT_AMBULATORY_CARE_PROVIDER_SITE_OTHER): Payer: Self-pay | Admitting: Internal Medicine

## 2019-10-13 DIAGNOSIS — H2513 Age-related nuclear cataract, bilateral: Secondary | ICD-10-CM | POA: Diagnosis not present

## 2019-10-14 ENCOUNTER — Other Ambulatory Visit (INDEPENDENT_AMBULATORY_CARE_PROVIDER_SITE_OTHER): Payer: Self-pay | Admitting: Internal Medicine

## 2019-10-14 MED ORDER — BLOOD GLUCOSE MONITOR KIT: PACK | 0 refills | Status: DC

## 2019-10-14 NOTE — Telephone Encounter (Signed)
Done

## 2019-10-15 ENCOUNTER — Ambulatory Visit (INDEPENDENT_AMBULATORY_CARE_PROVIDER_SITE_OTHER): Payer: Medicaid Other | Admitting: Nurse Practitioner

## 2019-10-15 ENCOUNTER — Encounter (INDEPENDENT_AMBULATORY_CARE_PROVIDER_SITE_OTHER): Payer: Self-pay | Admitting: Nurse Practitioner

## 2019-10-15 ENCOUNTER — Other Ambulatory Visit: Payer: Self-pay

## 2019-10-15 VITALS — BP 190/92 | HR 89 | Temp 97.2°F | Ht 62.0 in | Wt 165.2 lb

## 2019-10-15 DIAGNOSIS — I160 Hypertensive urgency: Secondary | ICD-10-CM | POA: Diagnosis not present

## 2019-10-15 DIAGNOSIS — E1165 Type 2 diabetes mellitus with hyperglycemia: Secondary | ICD-10-CM | POA: Diagnosis not present

## 2019-10-15 MED ORDER — SPHYGMOMANOMETER MISC: 1.0000 | Freq: Every day | 0 refills | Status: DC

## 2019-10-15 MED ORDER — AMLODIPINE BESYLATE 5 MG PO TABS: 5.0000 mg | ORAL_TABLET | Freq: Every day | ORAL | 1 refills | Status: DC

## 2019-10-15 MED ORDER — BLOOD GLUCOSE METER KIT: PACK | 4 refills | Status: DC

## 2019-10-15 NOTE — Progress Notes (Signed)
Subjective:  Patient ID: Ana [name], female    DOB: [birth year]  Age: 49 y.o. MRN: [id number]  CC:  Chief Complaint  Patient presents with   Hypertension      HPI  Patient arrives today for acute visit for hypertension.  She was scheduled to have cataract surgery completed today, however when she went to the surgeon's office today her blood pressure was found to be very elevated and they referred her to [place]. Per chart review I see that approximately 2 weeks ago she was seen in the urgent care for the same concerns. At that time her losartan was increased from 50 mg daily to 100 mg daily. Today, she tells me she is feeling okay and is not having any chest pain, shortness of breath, headaches, new blurred vision, or dizziness. She does report that she took her losartan this morning.    Past Medical History:  Diagnosis Date   Diabetes mellitus    diet controlled   Hypertension       Family History  Problem Relation Age of Onset   Kidney failure Mother    Diabetes Mother    Kidney disease Mother    Kidney failure Father    Kidney disease Father    Kidney failure Brother    Diabetes Brother    Diabetes Brother     Social History   Social History Narrative   Divorced,married for 8 years.Lives with daughter and 3 grandkids.Unemployed.   Social History   Tobacco Use   Smoking status: Never Smoker   Smokeless tobacco: Never Used  Substance Use Topics   Alcohol use: Yes    Comment: occ     Current Meds  Medication Sig   blood glucose meter kit and supplies KIT E11.9   glipiZIDE (GLUCOTROL) 5 MG tablet Take 1 tablet (5 mg total) by mouth 2 (two) times daily before a meal.   losartan (COZAAR) 100 MG tablet Take 1 tablet (100 mg total) by mouth daily.    ROS:  Review of Systems  Eyes: Positive for blurred vision.  Respiratory: Negative for shortness of breath.   Cardiovascular: Negative for chest pain.  Neurological: Negative for  dizziness and headaches.     Objective:   Today's Vitals: BP (!) 205/95 (BP Location: Left Arm, Patient Position: Sitting, Cuff Size: Normal)    Pulse 89    Temp (!) 97.2 F (36.2 C) (Temporal)    Ht 5' 2"  (1.575 m)    Wt 165 lb 3.2 oz (74.9 kg)    LMP 09/22/2019 (Approximate)    SpO2 98%    BMI 30.22 kg/m  Vitals with BMI 10/15/2019 10/01/2019 10/01/2019  Height 5' 2"  - -  Weight 165 lbs 3 oz - -  BMI 23.53 - -  Systolic 614 431 540  Diastolic 95 086 761  Pulse 89 - 83     Physical Exam Vitals reviewed.  Constitutional:      General: She is not in acute distress.    Appearance: Normal appearance.  HENT:     Head: Normocephalic and atraumatic.  Neck:     Vascular: No carotid bruit.  Cardiovascular:     Rate and Rhythm: Normal rate and regular rhythm.     Pulses: Normal pulses.     Heart sounds: Normal heart sounds.  Pulmonary:     Effort: Pulmonary effort is normal.     Breath sounds: Normal breath sounds.  Skin:    General: Skin is  warm and dry.  Neurological:     General: No focal deficit present.     Mental Status: She is alert and oriented to person, place, and time.  Psychiatric:        Mood and Affect: Mood normal.        Behavior: Behavior normal.        Judgment: Judgment normal.          Assessment and Plan   1. Hypertensive urgency      Plan: 1. I did discuss this patient with my supervising physician (Dr. Anastasio Champion) today.  We have determined that because she is asymptomatic and her blood pressure did slightly improve upon resting in the room that we will prescribe her an additional antihypertensive to be added to her medication regimen and she will follow up in approximately 1 week.  I will prescribe amlodipine 5 mg by mouth daily, and I have educated her that she should take the amlodipine in addition to her losartan.  We also discussed red flag symptoms of hypertension, MI, and CVA.  We did discuss that if she experiences chest pain, shortness of  breath, severe dizziness, lightheadedness, difficulty speaking, difficulty swallowing, sensation changes, and/or weakness that she should call 911.  If she starts the amlodipine and feels unwell she can notify our office.   Tests ordered No orders of the defined types were placed in this encounter.     No orders of the defined types were placed in this encounter.   Patient to follow-up in 1 to 2 weeks or sooner as needed.  Ailene Ards, NP

## 2019-10-16 ENCOUNTER — Other Ambulatory Visit (INDEPENDENT_AMBULATORY_CARE_PROVIDER_SITE_OTHER): Payer: Self-pay

## 2019-10-16 DIAGNOSIS — I10 Essential (primary) hypertension: Secondary | ICD-10-CM | POA: Diagnosis not present

## 2019-10-16 DIAGNOSIS — I160 Hypertensive urgency: Secondary | ICD-10-CM

## 2019-10-16 MED ORDER — SPHYGMOMANOMETER MISC: 1.0000 | Freq: Every day | 0 refills | Status: DC

## 2019-10-23 ENCOUNTER — Encounter (INDEPENDENT_AMBULATORY_CARE_PROVIDER_SITE_OTHER): Payer: Self-pay | Admitting: Internal Medicine

## 2019-10-23 ENCOUNTER — Ambulatory Visit (INDEPENDENT_AMBULATORY_CARE_PROVIDER_SITE_OTHER): Payer: Medicaid Other | Admitting: Internal Medicine

## 2019-10-23 ENCOUNTER — Other Ambulatory Visit: Payer: Self-pay

## 2019-10-23 VITALS — BP 138/88 | HR 85 | Temp 96.8°F | Resp 18 | Ht 61.0 in | Wt 160.0 lb

## 2019-10-23 DIAGNOSIS — E559 Vitamin D deficiency, unspecified: Secondary | ICD-10-CM

## 2019-10-23 DIAGNOSIS — E1165 Type 2 diabetes mellitus with hyperglycemia: Secondary | ICD-10-CM | POA: Diagnosis not present

## 2019-10-23 DIAGNOSIS — I10 Essential (primary) hypertension: Secondary | ICD-10-CM | POA: Diagnosis not present

## 2019-10-23 NOTE — Progress Notes (Signed)
Metrics: Intervention Frequency ACO  Documented Smoking Status Yearly  Screened one or more times in 24 months  Cessation Counseling or  Active cessation medication Past 24 months  Past 24 months   Guideline developer: UpToDate (See UpToDate for funding source) Date Released: 2014       Wellness Office Visit  Subjective:  Patient ID: Ana [name], female    DOB: 11-22-1970  Age: 49 y.o. MRN: [id number]  CC: This lady comes in for follow-up of uncontrolled diabetes, uncontrolled hypertension. HPI  She also has vitamin D deficiency and I think she has been recommended to take vitamin D3 10,000 units daily but has not done so. On the last visit, amlodipine was started in addition to the losartan and she has been compliant with this. She continues to take glipizide 5 mg twice a day for her uncontrolled diabetes and she tells me her blood sugar runs around 200. Past Medical History:  Diagnosis Date  . Diabetes mellitus    diet controlled  . Hypertension    Past Surgical History:  Procedure Laterality Date  . CESAREAN SECTION  x4  . CHOLECYSTECTOMY    . MASS EXCISION  10/27/2011   Procedure: EXCISION MASS;  Surgeon: Donato Heinz, MD;  Location: [facility];  Service: General;  Laterality: Right;  . TUBAL LIGATION       Family History  Problem Relation Age of Onset  . Kidney failure Mother   . Diabetes Mother   . Kidney disease Mother   . Kidney failure Father   . Kidney disease Father   . Kidney failure Brother   . Diabetes Brother   . Diabetes Brother     Social History   Social History Narrative   Divorced,married for 8 years.Lives with daughter and 3 grandkids.Unemployed.   Social History   Tobacco Use  . Smoking status: Never Smoker  . Smokeless tobacco: Never Used  Substance Use Topics  . Alcohol use: Yes    Comment: occ    Current Meds  Medication Sig  . amLODipine (NORVASC) 5 MG tablet Take 1 tablet (5 mg total) by mouth daily.  . blood glucose meter  kit and supplies KIT E11.9  . blood glucose meter kit and supplies Dispense based on patient and insurance preference. Use to check your blood glucose daily.  . Blood Pressure Monitoring (SPHYGMOMANOMETER) MISC 1 each by Does not apply route daily.  Marland Kitchen glipiZIDE (GLUCOTROL) 5 MG tablet Take 1 tablet (5 mg total) by mouth 2 (two) times daily before a meal.  . losartan (COZAAR) 100 MG tablet Take 1 tablet (100 mg total) by mouth daily.      Depression screen [facility] 2/9 09/17/2019  Decreased Interest 3  Down, Depressed, Hopeless 0  PHQ - 2 Score 3  Altered sleeping 0  Tired, decreased energy 0  Change in appetite 0  Feeling bad or failure about yourself  1  Trouble concentrating 2  Moving slowly or fidgety/restless 0  Suicidal thoughts 0  PHQ-9 Score 6  Difficult doing work/chores Not difficult at all     Objective:   Today's Vitals: BP 138/88 (BP Location: Left Arm, Patient Position: Sitting, Cuff Size: Normal)   Pulse 85   Temp (!) 96.8 F (36 C) (Temporal)   Resp 18   Ht _0  (1.549 m)   Wt 160 lb (72.6 kg)   SpO2 98%   BMI 30.23 kg/m  Vitals with BMI 10/23/2019 10/15/2019 10/15/2019  Height _1  - 5'  2"  Weight 160 lbs - 165 lbs 3 oz  BMI 98.61 - 48.30  Systolic 735 430 148  Diastolic 88 92 95  Pulse 85 - 89     Physical Exam  Her blood pressure is much improved now.  She remains obese.  She has gained 6 pounds from the last time I saw her in July.     Assessment   1. Uncontrolled type 2 diabetes mellitus with hyperglycemia ([place])   2. Essential hypertension, benign   3. Vitamin D deficiency disease       Tests ordered Orders Placed This Encounter  Procedures  . COMPLETE METABOLIC PANEL WITH GFR     Plan: 1. She will continue glipizide 5 mg twice a day.  I discussed a plant-based diet and discussed the blue zones with her today. 2. She will continue with amlodipine and losartan and I will check renal function again today. 3. Today I recommended again  that she should start taking vitamin D3 10,000 units daily. 4. We also discussed COVID-19 vaccination and I strongly recommended that she get this vaccination soon. 5. Follow-up in 6 weeks.   No orders of the defined types were placed in this encounter.   Doree Albee, MD

## 2019-10-23 NOTE — Patient Instructions (Signed)
VITAMIN D3 10,000 UNITS/DAY

## 2019-10-24 LAB — COMPLETE METABOLIC PANEL WITH GFR
AG Ratio: 1 (calc) (ref 1.0–2.5)
ALT: 18 U/L (ref 6–29)
AST: 21 U/L (ref 10–35)
Albumin: 3.4 g/dL — ABNORMAL LOW (ref 3.6–5.1)
Alkaline phosphatase (APISO): 210 U/L — ABNORMAL HIGH (ref 31–125)
BUN/Creatinine Ratio: 21 (calc) (ref 6–22)
BUN: 29 mg/dL — ABNORMAL HIGH (ref 7–25)
CO2: 27 mmol/L (ref 20–32)
Calcium: 9.1 mg/dL (ref 8.6–10.2)
Chloride: 102 mmol/L (ref 98–110)
Creat: 1.41 mg/dL — ABNORMAL HIGH (ref 0.50–1.10)
GFR, Est African American: 51 mL/min/{1.73_m2} — ABNORMAL LOW (ref >=60)
GFR, Est Non African American: 44 mL/min/{1.73_m2} — ABNORMAL LOW (ref >=60)
Globulin: 3.3 g/dL (calc) (ref 1.9–3.7)
Glucose, Bld: 285 mg/dL — ABNORMAL HIGH (ref 65–99)
Potassium: 5.2 mmol/L (ref 3.5–5.3)
Sodium: 135 mmol/L (ref 135–146)
Total Bilirubin: 0.3 mg/dL (ref 0.2–1.2)
Total Protein: 6.7 g/dL (ref 6.1–8.1)

## 2019-10-27 ENCOUNTER — Telehealth (INDEPENDENT_AMBULATORY_CARE_PROVIDER_SITE_OTHER): Payer: Self-pay

## 2019-10-27 NOTE — Progress Notes (Signed)
Please let the patient know that the kidney function tests are improving.  Continue with all the same medications.  Work on diet as we have discussed.Also remind her that she needs to get COVID-19 vaccination.  Follow-up as scheduled.

## 2019-10-27 NOTE — Telephone Encounter (Signed)
Called Dtr Evette Cristal to verify forms was sent to [facility]. Dtr got loud and yelling at staff about the forms not being done in a time frame she needed.  Explained that when papers was dropped off, Dr was on [place]. We have to have him to review & sign a hand signature. Also stated it has to be processed and clear from Referral staff member [facility] ) before forms can go out.   Dtr said she understood, but can't see why it took so long. Pt was just establish as a patient on 09/17/2019. Due to hr condition was prior to seeing [facility]. So we  Are working the patient in a timely fashion to meet her needs and forms process.

## 2019-10-27 NOTE — Progress Notes (Signed)
Pt will be following instructions for medications and the nutrition. RE-encourage the patient to make sure she goes to take COVID vaccine ASAP. Stated that we are having a increase in numbers out here and to be safe with health condition. To be here for family and grand kids how important it is to be here with love one after this pandemic ends.

## 2019-10-28 ENCOUNTER — Telehealth (INDEPENDENT_AMBULATORY_CARE_PROVIDER_SITE_OTHER): Payer: Self-pay

## 2019-10-28 ENCOUNTER — Ambulatory Visit (INDEPENDENT_AMBULATORY_CARE_PROVIDER_SITE_OTHER): Payer: Medicaid Other | Admitting: Internal Medicine

## 2019-10-28 NOTE — Telephone Encounter (Signed)
Does this mean we send over notes?
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
## 2019-12-12 ENCOUNTER — Ambulatory Visit: Payer: Self-pay | Admitting: *Deleted

## 2019-12-12 NOTE — Telephone Encounter (Signed)
Contacted pt regarding her BP and symptoms; her BP was 180/87; this reading was taken on her right upper arm; her SBP is usually 170-180s; she continues to have a headache, but has not taken any OTC pain relief medication; pt and her daughter advised to seek evaluation at Urgent Care or call her PCP's on call provider for recommendations; they verbalized understanding.

## 2019-12-12 NOTE — Telephone Encounter (Signed)
Pt called with complaints of BP 210/99 at 1000; she took her BP med at 1015 (losartan 100 mg and amlodipine 5 mg); the reading was taken on her right upper arm; she is complaining of headache rated 8 out of 10; her SBP is normally 180s; the pt normally sees Dr Anastasio Champion; recommendations made per nurse triage protocol; she verbalized understanding.  Reason for Disposition . [7] Systolic BP  >= 290 OR Diastolic >= 211 AND [1] missed most recent dose of blood pressure medication  Answer Assessment - Initial Assessment Questions 1. BLOOD PRESSURE: "What is the blood pressure?" "Did you take at least two measurements 5 minutes apart?"     210/99 2. ONSET: "When did you take your blood pressure?"     12/02/19 at 1000 3. HOW: "How did you obtain the blood pressure?" (e.g., visiting nurse, automatic home BP monitor)     Home cuff, right upper arm 4. HISTORY: "Do you have a history of high blood pressure?"     yes 5. MEDICATIONS: "Are you taking any medications for blood pressure?" "Have you missed any doses recently?"     Yes amlodipine 5 mg and losartan 100 mg 6. OTHER SYMPTOMS: "Do you have any symptoms?" (e.g., headache, chest pain, blurred vision, difficulty breathing, weakness)     headache 7. PREGNANCY: "Is there any chance you are pregnant?" "When was your last menstrual period?"     no  Protocols used: BLOOD PRESSURE - HIGH-A-AH

## 2019-12-15 ENCOUNTER — Encounter (INDEPENDENT_AMBULATORY_CARE_PROVIDER_SITE_OTHER): Payer: Self-pay | Admitting: Internal Medicine

## 2019-12-15 ENCOUNTER — Ambulatory Visit (INDEPENDENT_AMBULATORY_CARE_PROVIDER_SITE_OTHER): Payer: Medicaid Other | Admitting: Internal Medicine

## 2019-12-15 ENCOUNTER — Other Ambulatory Visit: Payer: Self-pay

## 2019-12-15 ENCOUNTER — Telehealth (INDEPENDENT_AMBULATORY_CARE_PROVIDER_SITE_OTHER): Payer: Self-pay

## 2019-12-15 VITALS — BP 150/90 | HR 79 | Temp 96.9°F | Ht 61.0 in | Wt 171.8 lb

## 2019-12-15 DIAGNOSIS — I10 Essential (primary) hypertension: Secondary | ICD-10-CM

## 2019-12-15 DIAGNOSIS — E559 Vitamin D deficiency, unspecified: Secondary | ICD-10-CM | POA: Diagnosis not present

## 2019-12-15 DIAGNOSIS — E1165 Type 2 diabetes mellitus with hyperglycemia: Secondary | ICD-10-CM | POA: Diagnosis not present

## 2019-12-15 MED ORDER — AMLODIPINE BESYLATE 10 MG PO TABS
10.0000 mg | ORAL_TABLET | Freq: Every day | ORAL | 3 refills | Status: DC
Start: 2019-12-15 — End: 2020-04-23

## 2019-12-15 NOTE — Progress Notes (Signed)
Metrics: Intervention Frequency ACO  Documented Smoking Status Yearly  Screened one or more times in 24 months  Cessation Counseling or  Active cessation medication Past 24 months  Past 24 months   Guideline developer: UpToDate (See UpToDate for funding source) Date Released: 2014       Wellness Office Visit  Subjective:  Patient ID: Ana [name], female    DOB: Jul 29, 1970  Age: 49 y.o. MRN: [id number]  CC: This lady comes in for uncontrolled hypertension. HPI  Home readings have been elevated above systolic 030.  She continues on losartan and amlodipine.  She has headaches. Past Medical History:  Diagnosis Date  . Diabetes mellitus    diet controlled  . Hypertension    Past Surgical History:  Procedure Laterality Date  . CESAREAN SECTION  x4  . CHOLECYSTECTOMY    . MASS EXCISION  10/27/2011   Procedure: EXCISION MASS;  Surgeon: Donato Heinz, MD;  Location: [facility];  Service: General;  Laterality: Right;  . TUBAL LIGATION       Family History  Problem Relation Age of Onset  . Kidney failure Mother   . Diabetes Mother   . Kidney disease Mother   . Kidney failure Father   . Kidney disease Father   . Kidney failure Brother   . Diabetes Brother   . Diabetes Brother     Social History   Social History Narrative   Divorced,married for 8 years.Lives with daughter and 3 grandkids.Unemployed.   Social History   Tobacco Use  . Smoking status: Never Smoker  . Smokeless tobacco: Never Used  Substance Use Topics  . Alcohol use: Yes    Comment: occ    Current Meds  Medication Sig  . ACCU-CHEK GUIDE test strip daily.  . Accu-Chek Softclix Lancets lancets daily.  Marland Kitchen amLODipine (NORVASC) 5 MG tablet Take 1 tablet (5 mg total) by mouth daily.  . blood glucose meter kit and supplies KIT E11.9  . blood glucose meter kit and supplies Dispense based on patient and insurance preference. Use to check your blood glucose daily.  . Blood Pressure Monitoring  (SPHYGMOMANOMETER) MISC 1 each by Does not apply route daily.  Marland Kitchen glipiZIDE (GLUCOTROL) 5 MG tablet Take 1 tablet (5 mg total) by mouth 2 (two) times daily before a meal.  . losartan (COZAAR) 100 MG tablet Take 1 tablet (100 mg total) by mouth daily.      Depression screen [facility] 2/9 09/17/2019  Decreased Interest 3  Down, Depressed, Hopeless 0  PHQ - 2 Score 3  Altered sleeping 0  Tired, decreased energy 0  Change in appetite 0  Feeling bad or failure about yourself  1  Trouble concentrating 2  Moving slowly or fidgety/restless 0  Suicidal thoughts 0  PHQ-9 Score 6  Difficult doing work/chores Not difficult at all     Objective:   Today's Vitals: BP (!) 150/90   Pulse 79   Temp (!) 96.9 F (36.1 C) (Temporal)   Ht _0  (1.549 m)   Wt 171 lb 12.8 oz (77.9 kg)   SpO2 96%   BMI 32.46 kg/m  Vitals with BMI 12/15/2019 10/23/2019 10/15/2019  Height _1  _2  -  Weight 171 lbs 13 oz 160 lbs -  BMI 09.23 30.07 -  Systolic 622 633 354  Diastolic 90 88 92  Pulse 79 85 -     Physical Exam She looks systemically well.  Blood pressure is elevated today.  She is alert  and orientated without any focal neurological signs.  She has gained 11 pounds since the last time she was seen.      Assessment   1. Uncontrolled type 2 diabetes mellitus with hyperglycemia ([place])   2. Essential hypertension, benign   3. Vitamin D deficiency disease       Tests ordered No orders of the defined types were placed in this encounter.    Plan: 1. I recommended that we increase the amlodipine to 10 mg daily so she will double up on the bottle of amlodipine 5 mg tablets and get the new prescription that I have ordered. 2. I also clarified vitamin D3 that she has been taking.  She has been taking vitamin D3 1000 units daily.  Her vitamin D levels were extremely low and I meant for her to take vitamin D3 10,000 units daily I clarified this issue today and she will corrected. 3. Follow-up in a  couple of months to see how she is doing and we will check all the blood work then.   Meds ordered this encounter  Medications  . amLODipine (NORVASC) 10 MG tablet    Sig: Take 1 tablet (10 mg total) by mouth daily.    Dispense:  30 tablet    Refill:  3    Elin Seats Luther Parody, MD

## 2019-12-15 NOTE — Patient Instructions (Signed)
[facility] Dietary Recommendations for Weight Loss What to Avoid . Avoid added sugars o Often added sugar can be found in processed foods such as many condiments, dry cereals, cakes, cookies, chips, crisps, crackers, candies, sweetened drinks, etc.  o Read labels and AVOID/DECREASE use of foods with the following in their ingredient list: Sugar, fructose, high fructose corn syrup, sucrose, glucose, maltose, dextrose, molasses, cane sugar, brown sugar, any type of syrup, agave nectar, etc.   . Avoid snacking in between meals . Avoid foods made with flour o If you are going to eat food made with flour, choose those made with whole-grains; and, minimize your consumption as much as is tolerable . Avoid processed foods o These foods are generally stocked in the middle of the grocery store. Focus on shopping on the perimeter of the grocery.  . Avoid Meat  o We recommend following a plant-based diet at [facility]. Thus, we recommend avoiding meat as a general rule. Consider eating beans, legumes, eggs, and/or dairy products for regular protein sources o If you plan on eating meat limit to 4 ounces of meat at a time and choose lean options such as Fish, chicken, turkey. Avoid red meat intake such as pork and/or steak What to Include . Vegetables o GREEN LEAFY VEGETABLES: Kale, spinach, mustard greens, collard greens, cabbage, broccoli, etc. o OTHER: Asparagus, cauliflower, eggplant, carrots, peas, Brussel sprouts, tomatoes, bell peppers, zucchini, beets, cucumbers, etc. . Grains, seeds, and legumes o Beans: kidney beans, black eyed peas, garbanzo beans, black beans, pinto beans, etc. o Whole, unrefined grains: brown rice, barley, bulgur, oatmeal, etc. . Healthy fats  o Avoid highly processed fats such as vegetable oil o Examples of healthy fats: avocado, olives, virgin olive oil, dark chocolate (?72% Cocoa), nuts (peanuts, almonds, walnuts, cashews, pecans, etc.) . None to Low  Intake of Animal Sources of Protein o Meat sources: chicken, turkey, salmon, tuna. Limit to 4 ounces of meat at one time. o Consider limiting dairy sources, but when choosing dairy focus on: PLAIN Greek yogurt, cottage cheese, high-protein milk . Fruit o Choose berries  When to Eat . Intermittent Fasting: o Choosing not to eat for a specific time period, but DO FOCUS ON HYDRATION when fasting o Multiple Techniques: - Time Restricted Eating: eat 3 meals in a day, each meal lasting no more than 60 minutes, no snacks between meals - 16-18 hour fast: fast for 16 to 18 hours up to 7 days a week. Often suggested to start with 2-3 nonconsecutive days per week.  . Remember the time you sleep is counted as fasting.  . Examples of eating schedule: Fast from 7:00pm-11:00am. Eat between 11:00am-7:00pm.  - 24-hour fast: fast for 24 hours up to every other day. Often suggested to start with 1 day per week . Remember the time you sleep is counted as fasting . Examples of eating schedule:  o Eating day: eat 2-3 meals on your eating day. If doing 2 meals, each meal should last no more than 90 minutes. If doing 3 meals, each meal should last no more than 60 minutes. Finish last meal by 7:00pm. o Fasting day: Fast until 7:00pm.  o IF YOU FEEL UNWELL FOR ANY REASON/IN ANY WAY WHEN FASTING, STOP FASTING BY EATING A NUTRITIOUS SNACK OR LIGHT MEAL o ALWAYS FOCUS ON HYDRATION DURING FASTS - Acceptable Hydration sources: water, broths, tea/coffee (black tea/coffee is best but using a small amount of whole-fat dairy products in coffee/tea is acceptable).  -   Poor Hydration Sources: anything with sugar or artificial sweeteners added to it  These recommendations have been developed for patients that are actively receiving medical care from either Dr. Jasira Robinson or Sarah Gray, DNP, NP-C at [facility]. These recommendations are developed for patients with specific medical conditions and are not meant to be  distributed or used by others that are not actively receiving care from either provider listed above at [facility]. It is not appropriate to participate in the above eating plans without proper medical supervision.   Reference: Fung, J. The obesity code. [place]: [place]; 2016.   

## 2019-12-15 NOTE — Telephone Encounter (Signed)
Thank you, the patient has an appointment with me today apparently.

## 2019-12-15 NOTE — Telephone Encounter (Signed)
Patient called and left a message and stated that her blood pressure was up to 210/99 and she was able to get it down. Patient stated that her BP levels have been up and down and at times she does have a headache and some nausea. Patient has an appointment today and time was verified. Sending as Juluis Rainier.

## 2020-01-10 ENCOUNTER — Other Ambulatory Visit (INDEPENDENT_AMBULATORY_CARE_PROVIDER_SITE_OTHER): Payer: Self-pay | Admitting: Internal Medicine

## 2020-01-18 DIAGNOSIS — I10 Essential (primary) hypertension: Secondary | ICD-10-CM | POA: Diagnosis not present

## 2020-01-18 DIAGNOSIS — R5381 Other malaise: Secondary | ICD-10-CM | POA: Diagnosis not present

## 2020-01-18 DIAGNOSIS — Z743 Need for continuous supervision: Secondary | ICD-10-CM | POA: Diagnosis not present

## 2020-02-11 ENCOUNTER — Other Ambulatory Visit (INDEPENDENT_AMBULATORY_CARE_PROVIDER_SITE_OTHER): Payer: Self-pay | Admitting: Internal Medicine

## 2020-02-11 DIAGNOSIS — E1165 Type 2 diabetes mellitus with hyperglycemia: Secondary | ICD-10-CM

## 2020-02-21 ENCOUNTER — Other Ambulatory Visit (INDEPENDENT_AMBULATORY_CARE_PROVIDER_SITE_OTHER): Payer: Self-pay | Admitting: Internal Medicine

## 2020-02-23 DIAGNOSIS — E1165 Type 2 diabetes mellitus with hyperglycemia: Secondary | ICD-10-CM | POA: Diagnosis not present

## 2020-02-24 DIAGNOSIS — E1165 Type 2 diabetes mellitus with hyperglycemia: Secondary | ICD-10-CM | POA: Diagnosis not present

## 2020-02-25 DIAGNOSIS — E1165 Type 2 diabetes mellitus with hyperglycemia: Secondary | ICD-10-CM | POA: Diagnosis not present

## 2020-02-26 DIAGNOSIS — E1165 Type 2 diabetes mellitus with hyperglycemia: Secondary | ICD-10-CM | POA: Diagnosis not present

## 2020-02-27 DIAGNOSIS — E1165 Type 2 diabetes mellitus with hyperglycemia: Secondary | ICD-10-CM | POA: Diagnosis not present

## 2020-02-28 DIAGNOSIS — E1165 Type 2 diabetes mellitus with hyperglycemia: Secondary | ICD-10-CM | POA: Diagnosis not present

## 2020-02-29 DIAGNOSIS — E1165 Type 2 diabetes mellitus with hyperglycemia: Secondary | ICD-10-CM | POA: Diagnosis not present

## 2020-03-01 DIAGNOSIS — E1165 Type 2 diabetes mellitus with hyperglycemia: Secondary | ICD-10-CM | POA: Diagnosis not present

## 2020-03-02 DIAGNOSIS — E1165 Type 2 diabetes mellitus with hyperglycemia: Secondary | ICD-10-CM | POA: Diagnosis not present

## 2020-03-03 DIAGNOSIS — E1165 Type 2 diabetes mellitus with hyperglycemia: Secondary | ICD-10-CM | POA: Diagnosis not present

## 2020-03-04 DIAGNOSIS — E1165 Type 2 diabetes mellitus with hyperglycemia: Secondary | ICD-10-CM | POA: Diagnosis not present

## 2020-03-05 DIAGNOSIS — E1165 Type 2 diabetes mellitus with hyperglycemia: Secondary | ICD-10-CM | POA: Diagnosis not present

## 2020-03-05 DIAGNOSIS — H2513 Age-related nuclear cataract, bilateral: Secondary | ICD-10-CM | POA: Diagnosis not present

## 2020-03-06 DIAGNOSIS — E1165 Type 2 diabetes mellitus with hyperglycemia: Secondary | ICD-10-CM | POA: Diagnosis not present

## 2020-03-07 DIAGNOSIS — E1165 Type 2 diabetes mellitus with hyperglycemia: Secondary | ICD-10-CM | POA: Diagnosis not present

## 2020-03-08 DIAGNOSIS — E1165 Type 2 diabetes mellitus with hyperglycemia: Secondary | ICD-10-CM | POA: Diagnosis not present

## 2020-03-09 DIAGNOSIS — E1165 Type 2 diabetes mellitus with hyperglycemia: Secondary | ICD-10-CM | POA: Diagnosis not present

## 2020-03-10 DIAGNOSIS — E1165 Type 2 diabetes mellitus with hyperglycemia: Secondary | ICD-10-CM | POA: Diagnosis not present

## 2020-03-11 DIAGNOSIS — E1165 Type 2 diabetes mellitus with hyperglycemia: Secondary | ICD-10-CM | POA: Diagnosis not present

## 2020-03-12 DIAGNOSIS — E1165 Type 2 diabetes mellitus with hyperglycemia: Secondary | ICD-10-CM | POA: Diagnosis not present

## 2020-03-13 DIAGNOSIS — E1165 Type 2 diabetes mellitus with hyperglycemia: Secondary | ICD-10-CM | POA: Diagnosis not present

## 2020-03-14 DIAGNOSIS — E1165 Type 2 diabetes mellitus with hyperglycemia: Secondary | ICD-10-CM | POA: Diagnosis not present

## 2020-03-15 DIAGNOSIS — E1165 Type 2 diabetes mellitus with hyperglycemia: Secondary | ICD-10-CM | POA: Diagnosis not present

## 2020-03-16 DIAGNOSIS — E1165 Type 2 diabetes mellitus with hyperglycemia: Secondary | ICD-10-CM | POA: Diagnosis not present

## 2020-03-17 DIAGNOSIS — E1165 Type 2 diabetes mellitus with hyperglycemia: Secondary | ICD-10-CM | POA: Diagnosis not present

## 2020-03-18 ENCOUNTER — Encounter (INDEPENDENT_AMBULATORY_CARE_PROVIDER_SITE_OTHER): Payer: Self-pay | Admitting: Internal Medicine

## 2020-03-18 ENCOUNTER — Ambulatory Visit (INDEPENDENT_AMBULATORY_CARE_PROVIDER_SITE_OTHER): Payer: Medicaid Other | Admitting: Internal Medicine

## 2020-03-18 ENCOUNTER — Other Ambulatory Visit: Payer: Self-pay

## 2020-03-18 VITALS — BP 168/100 | HR 89 | Temp 96.5°F | Ht 61.0 in | Wt 182.2 lb

## 2020-03-18 DIAGNOSIS — H269 Unspecified cataract: Secondary | ICD-10-CM

## 2020-03-18 DIAGNOSIS — E559 Vitamin D deficiency, unspecified: Secondary | ICD-10-CM | POA: Diagnosis not present

## 2020-03-18 DIAGNOSIS — I10 Essential (primary) hypertension: Secondary | ICD-10-CM

## 2020-03-18 DIAGNOSIS — E782 Mixed hyperlipidemia: Secondary | ICD-10-CM

## 2020-03-18 DIAGNOSIS — E1165 Type 2 diabetes mellitus with hyperglycemia: Secondary | ICD-10-CM | POA: Diagnosis not present

## 2020-03-18 MED ORDER — HYDRALAZINE HCL 25 MG PO TABS: 25.0000 mg | ORAL_TABLET | Freq: Three times a day (TID) | ORAL | 3 refills | Status: DC

## 2020-03-18 NOTE — Progress Notes (Signed)
Metrics: Intervention Frequency ACO  Documented Smoking Status Yearly  Screened one or more times in 24 months  Cessation Counseling or  Active cessation medication Past 24 months  Past 24 months   Guideline developer: UpToDate (See UpToDate for funding source) Date Released: 2014       Wellness Office Visit  Subjective:  Patient ID: Ana [name], female    DOB: 1970-12-03  Age: 50 y.o. MRN: [id number]  CC: This lady comes in for follow-up of uncontrolled hypertension, diabetes, dyslipidemia and vitamin D deficiency. HPI  She is due to have cataract surgery soon but the surgeon is concerned about uncontrolled hypertension.  She is compliant with her amlodipine and losartan but still blood pressure numbers have been elevated. She continues on glipizide for diabetes. She has been taking vitamin D3 supplementation for vitamin D deficiency. She describes cold intolerance more recently. Past Medical History:  Diagnosis Date  . Diabetes mellitus    diet controlled  . Hypertension    Past Surgical History:  Procedure Laterality Date  . CESAREAN SECTION  x4  . CHOLECYSTECTOMY    . MASS EXCISION  10/27/2011   Procedure: EXCISION MASS;  Surgeon: Donato Heinz, MD;  Location: [facility];  Service: General;  Laterality: Right;  . TUBAL LIGATION       Family History  Problem Relation Age of Onset  . Kidney failure Mother   . Diabetes Mother   . Kidney disease Mother   . Kidney failure Father   . Kidney disease Father   . Kidney failure Brother   . Diabetes Brother   . Diabetes Brother     Social History   Social History Narrative   Divorced,married for 8 years.Lives with daughter and 3 grandkids.Unemployed.   Social History   Tobacco Use  . Smoking status: Never Smoker  . Smokeless tobacco: Never Used  Substance Use Topics  . Alcohol use: Yes    Comment: occ    Current Meds  Medication Sig  . ACCU-CHEK GUIDE test strip daily.  . Accu-Chek Softclix Lancets lancets  daily.  Marland Kitchen amLODipine (NORVASC) 10 MG tablet Take 1 tablet (10 mg total) by mouth daily.  . blood glucose meter kit and supplies KIT E11.9  . blood glucose meter kit and supplies Dispense based on patient and insurance preference. Use to check your blood glucose daily.  . Blood Pressure Monitoring (SPHYGMOMANOMETER) MISC 1 each by Does not apply route daily.  . Cholecalciferol (VITAMIN D3) 125 MCG (5000 UT) CAPS Take 1 capsule by mouth daily.  Marland Kitchen glipiZIDE (GLUCOTROL) 5 MG tablet TAKE 1 TABLET(5 MG) BY MOUTH TWICE DAILY BEFORE A MEAL  . hydrALAZINE (APRESOLINE) 25 MG tablet Take 1 tablet (25 mg total) by mouth 3 (three) times daily.  Marland Kitchen losartan (COZAAR) 100 MG tablet TAKE 1 TABLET(100 MG) BY MOUTH DAILY      Depression screen [facility] 2/9 09/17/2019  Decreased Interest 3  Down, Depressed, Hopeless 0  PHQ - 2 Score 3  Altered sleeping 0  Tired, decreased energy 0  Change in appetite 0  Feeling bad or failure about yourself  1  Trouble concentrating 2  Moving slowly or fidgety/restless 0  Suicidal thoughts 0  PHQ-9 Score 6  Difficult doing work/chores Not difficult at all     Objective:   Today's Vitals: BP (!) 168/100   Pulse 89   Temp (!) 96.5 F (35.8 C) (Temporal)   Ht 5' 1"  (1.549 m)   Wt 182 lb 3.2 oz (  82.6 kg)   SpO2 99%   BMI 34.43 kg/m  Vitals with BMI 03/18/2020 12/15/2019 10/23/2019  Height 5' 1"  5' 1"  5' 1"   Weight 182 lbs 3 oz 171 lbs 13 oz 160 lbs  BMI 34.44 27.74 12.87  Systolic 867 672 094  Diastolic 709 90 88  Pulse 89 79 85     Physical Exam  She has gained weight of 11 pounds since the last visit.  Blood pressure is elevated and uncontrolled although she says she has not taken her medications this morning.  She is alert and orientated without any obvious focal neurological signs.     Assessment   1. Uncontrolled type 2 diabetes mellitus with hyperglycemia ([place])   2. Essential hypertension, benign   3. Mixed hyperlipidemia   4. Vitamin D deficiency  disease   5. Cataract of both eyes, unspecified cataract type       Tests ordered Orders Placed This Encounter  Procedures  . COMPLETE METABOLIC PANEL WITH GFR  . Hemoglobin A1c  . VITAMIN D 25 Hydroxy (Vit-D Deficiency, Fractures)  . T3, free  . T4, free  . TSH  . Lipid panel     Plan: 1. Although she has not taking her antihypertensive medications in the morning today, I believe that her blood pressure is still not controlled so I will add hydralazine 25 mg 3 times a day in addition to the amlodipine 10 mg daily and losartan 100 mg daily. 2. She will continue with glipizide and we will check an A1c today. 3. In terms of cold intolerance, we will check thyroid function test today. 4. She will continue with vitamin D3 supplementation and we will check vitamin D levels today. 5. Further recommendations will depend on blood results and I will have her follow-up with Sarah in about 1 month to make sure her blood pressure comes under better control.   Meds ordered this encounter  Medications  . hydrALAZINE (APRESOLINE) 25 MG tablet    Sig: Take 1 tablet (25 mg total) by mouth 3 (three) times daily.    Dispense:  90 tablet    Refill:  3    Oyinkansola Truax Luther Parody, MD

## 2020-03-19 DIAGNOSIS — E1165 Type 2 diabetes mellitus with hyperglycemia: Secondary | ICD-10-CM | POA: Diagnosis not present

## 2020-03-19 LAB — HEMOGLOBIN A1C
Hgb A1c MFr Bld: 11.2 % of total Hgb — ABNORMAL HIGH (ref <5.7)
Mean Plasma Glucose: 275 mg/dL
eAG (mmol/L): 15.2 mmol/L

## 2020-03-19 LAB — LIPID PANEL
Cholesterol: 233 mg/dL — ABNORMAL HIGH (ref <200)
HDL: 63 mg/dL (ref >=50)
LDL Cholesterol (Calc): 154 mg/dL (calc) — ABNORMAL HIGH
Non-HDL Cholesterol (Calc): 170 mg/dL (calc) — ABNORMAL HIGH (ref <130)
Total CHOL/HDL Ratio: 3.7 (calc) (ref <5.0)
Triglycerides: 69 mg/dL (ref <150)

## 2020-03-19 LAB — COMPLETE METABOLIC PANEL WITH GFR
AG Ratio: 1 (calc) (ref 1.0–2.5)
ALT: 11 U/L (ref 6–29)
AST: 14 U/L (ref 10–35)
Albumin: 3.6 g/dL (ref 3.6–5.1)
Alkaline phosphatase (APISO): 135 U/L — ABNORMAL HIGH (ref 31–125)
BUN/Creatinine Ratio: 20 (calc) (ref 6–22)
BUN: 34 mg/dL — ABNORMAL HIGH (ref 7–25)
CO2: 25 mmol/L (ref 20–32)
Calcium: 9.4 mg/dL (ref 8.6–10.2)
Chloride: 101 mmol/L (ref 98–110)
Creat: 1.7 mg/dL — ABNORMAL HIGH (ref 0.50–1.10)
GFR, Est African American: 40 mL/min/{1.73_m2} — ABNORMAL LOW (ref >=60)
GFR, Est Non African American: 35 mL/min/{1.73_m2} — ABNORMAL LOW (ref >=60)
Globulin: 3.5 g/dL (calc) (ref 1.9–3.7)
Glucose, Bld: 298 mg/dL — ABNORMAL HIGH (ref 65–99)
Potassium: 4.8 mmol/L (ref 3.5–5.3)
Sodium: 134 mmol/L — ABNORMAL LOW (ref 135–146)
Total Bilirubin: 0.3 mg/dL (ref 0.2–1.2)
Total Protein: 7.1 g/dL (ref 6.1–8.1)

## 2020-03-19 LAB — TSH: TSH: 6.55 mIU/L — ABNORMAL HIGH

## 2020-03-19 LAB — VITAMIN D 25 HYDROXY (VIT D DEFICIENCY, FRACTURES): Vit D, 25-Hydroxy: 31 ng/mL (ref 30–100)

## 2020-03-19 LAB — T3, FREE: T3, Free: 3.8 pg/mL (ref 2.3–4.2)

## 2020-03-19 LAB — T4, FREE: Free T4: 1.2 ng/dL (ref 0.8–1.8)

## 2020-03-20 DIAGNOSIS — E1165 Type 2 diabetes mellitus with hyperglycemia: Secondary | ICD-10-CM | POA: Diagnosis not present

## 2020-03-21 DIAGNOSIS — E1165 Type 2 diabetes mellitus with hyperglycemia: Secondary | ICD-10-CM | POA: Diagnosis not present

## 2020-03-22 DIAGNOSIS — E1165 Type 2 diabetes mellitus with hyperglycemia: Secondary | ICD-10-CM | POA: Diagnosis not present

## 2020-03-22 NOTE — Progress Notes (Signed)
Please call this patient.  Ask her to come in for a follow-up in the next week or 2 to discuss all the results in detail.  Her diabetes needs to be controlled better and also she has underactive thyroid.  Thanks.

## 2020-03-22 NOTE — Progress Notes (Signed)
PT WAS GIVEN LAB CONCERNS FROM DR Anastasio Champion ON ABNORMAL RESULTS.STATED HER A1C & THYROID WAS NOT IN A GOOD NORMAL STATE. PT SAID SHE COULD TELL SOMETHING WAS GOING ON WITH HER BODY.  FROM HAVING STOMACH PAIN AND , ABNORMAL MENSTRUAL CYCLES. PT SAID SHE SUPPOSE TO GO FOR CATARACT SURGERY  ON 03/25/19 @ [facility]. A APPT WAS MADE FOR 2/15 W/ DR Anastasio Champion.

## 2020-03-23 ENCOUNTER — Telehealth (INDEPENDENT_AMBULATORY_CARE_PROVIDER_SITE_OTHER): Payer: Self-pay

## 2020-03-23 DIAGNOSIS — E1165 Type 2 diabetes mellitus with hyperglycemia: Secondary | ICD-10-CM | POA: Diagnosis not present

## 2020-03-24 DIAGNOSIS — E1165 Type 2 diabetes mellitus with hyperglycemia: Secondary | ICD-10-CM | POA: Diagnosis not present

## 2020-03-25 DIAGNOSIS — E1165 Type 2 diabetes mellitus with hyperglycemia: Secondary | ICD-10-CM | POA: Diagnosis not present

## 2020-03-28 DIAGNOSIS — E1165 Type 2 diabetes mellitus with hyperglycemia: Secondary | ICD-10-CM | POA: Diagnosis not present

## 2020-03-29 DIAGNOSIS — E1165 Type 2 diabetes mellitus with hyperglycemia: Secondary | ICD-10-CM | POA: Diagnosis not present

## 2020-03-30 DIAGNOSIS — E1165 Type 2 diabetes mellitus with hyperglycemia: Secondary | ICD-10-CM | POA: Diagnosis not present

## 2020-03-31 ENCOUNTER — Other Ambulatory Visit: Payer: Self-pay

## 2020-03-31 ENCOUNTER — Ambulatory Visit (INDEPENDENT_AMBULATORY_CARE_PROVIDER_SITE_OTHER): Payer: Medicaid Other

## 2020-03-31 ENCOUNTER — Encounter (INDEPENDENT_AMBULATORY_CARE_PROVIDER_SITE_OTHER): Payer: Self-pay

## 2020-03-31 DIAGNOSIS — E1165 Type 2 diabetes mellitus with hyperglycemia: Secondary | ICD-10-CM | POA: Diagnosis not present

## 2020-03-31 DIAGNOSIS — H25811 Combined forms of age-related cataract, right eye: Secondary | ICD-10-CM | POA: Diagnosis not present

## 2020-03-31 NOTE — Telephone Encounter (Signed)
Pt surgery was canceled. Pt has not f/u to help with BP concerns. Advise her 9 days ago of this. She said she would call back.

## 2020-03-31 NOTE — Progress Notes (Cosign Needed)
Patient came into office today after having surgery on right eye at 9:30am this morning. Patient did have elevated BP at surgical center. They gave her Hydralazine 10 mg by IV and also gave her Labetalol 5 mg by IV this morning. Patient denies having headaches, nausea, or dizziness. Surgery done at [facility] by Dr. Jola Schmidt.

## 2020-04-01 DIAGNOSIS — E1165 Type 2 diabetes mellitus with hyperglycemia: Secondary | ICD-10-CM | POA: Diagnosis not present

## 2020-04-02 DIAGNOSIS — E1165 Type 2 diabetes mellitus with hyperglycemia: Secondary | ICD-10-CM | POA: Diagnosis not present

## 2020-04-03 DIAGNOSIS — E1165 Type 2 diabetes mellitus with hyperglycemia: Secondary | ICD-10-CM | POA: Diagnosis not present

## 2020-04-04 DIAGNOSIS — E1165 Type 2 diabetes mellitus with hyperglycemia: Secondary | ICD-10-CM | POA: Diagnosis not present

## 2020-04-05 DIAGNOSIS — E1165 Type 2 diabetes mellitus with hyperglycemia: Secondary | ICD-10-CM | POA: Diagnosis not present

## 2020-04-06 DIAGNOSIS — E1165 Type 2 diabetes mellitus with hyperglycemia: Secondary | ICD-10-CM | POA: Diagnosis not present

## 2020-04-07 DIAGNOSIS — E1165 Type 2 diabetes mellitus with hyperglycemia: Secondary | ICD-10-CM | POA: Diagnosis not present

## 2020-04-08 DIAGNOSIS — E1165 Type 2 diabetes mellitus with hyperglycemia: Secondary | ICD-10-CM | POA: Diagnosis not present

## 2020-04-09 DIAGNOSIS — E1165 Type 2 diabetes mellitus with hyperglycemia: Secondary | ICD-10-CM | POA: Diagnosis not present

## 2020-04-10 DIAGNOSIS — E1165 Type 2 diabetes mellitus with hyperglycemia: Secondary | ICD-10-CM | POA: Diagnosis not present

## 2020-04-11 DIAGNOSIS — E1165 Type 2 diabetes mellitus with hyperglycemia: Secondary | ICD-10-CM | POA: Diagnosis not present

## 2020-04-12 DIAGNOSIS — H35033 Hypertensive retinopathy, bilateral: Secondary | ICD-10-CM | POA: Diagnosis not present

## 2020-04-12 DIAGNOSIS — E1165 Type 2 diabetes mellitus with hyperglycemia: Secondary | ICD-10-CM | POA: Diagnosis not present

## 2020-04-12 DIAGNOSIS — H4311 Vitreous hemorrhage, right eye: Secondary | ICD-10-CM | POA: Diagnosis not present

## 2020-04-12 DIAGNOSIS — E113523 Type 2 diabetes mellitus with proliferative diabetic retinopathy with traction retinal detachment involving the macula, bilateral: Secondary | ICD-10-CM | POA: Diagnosis not present

## 2020-04-12 DIAGNOSIS — H35373 Puckering of macula, bilateral: Secondary | ICD-10-CM | POA: Diagnosis not present

## 2020-04-13 ENCOUNTER — Ambulatory Visit (INDEPENDENT_AMBULATORY_CARE_PROVIDER_SITE_OTHER): Payer: Medicaid Other | Admitting: Internal Medicine

## 2020-04-13 ENCOUNTER — Other Ambulatory Visit: Payer: Self-pay

## 2020-04-13 ENCOUNTER — Encounter (INDEPENDENT_AMBULATORY_CARE_PROVIDER_SITE_OTHER): Payer: Self-pay | Admitting: Internal Medicine

## 2020-04-13 VITALS — BP 150/100 | HR 95 | Ht 60.0 in | Wt 188.6 lb

## 2020-04-13 DIAGNOSIS — E039 Hypothyroidism, unspecified: Secondary | ICD-10-CM | POA: Diagnosis not present

## 2020-04-13 DIAGNOSIS — I10 Essential (primary) hypertension: Secondary | ICD-10-CM | POA: Diagnosis not present

## 2020-04-13 DIAGNOSIS — E782 Mixed hyperlipidemia: Secondary | ICD-10-CM

## 2020-04-13 DIAGNOSIS — E1165 Type 2 diabetes mellitus with hyperglycemia: Secondary | ICD-10-CM

## 2020-04-13 MED ORDER — GLIPIZIDE 10 MG PO TABS: 10.0000 mg | ORAL_TABLET | Freq: Two times a day (BID) | ORAL | 3 refills | Status: DC

## 2020-04-13 MED ORDER — LEVOTHYROXINE SODIUM 50 MCG PO TABS: 50.0000 ug | ORAL_TABLET | Freq: Every day | ORAL | 3 refills | Status: DC

## 2020-04-13 NOTE — Patient Instructions (Signed)
[name] Dietary Recommendations for Weight Loss What to Avoid . Avoid added sugars o Often added sugar can be found in processed foods such as many condiments, dry cereals, cakes, cookies, chips, crisps, crackers, candies, sweetened drinks, etc.  o Read labels and AVOID/DECREASE use of foods with the following in their ingredient list: Sugar, fructose, high fructose corn syrup, sucrose, glucose, maltose, dextrose, molasses, cane sugar, brown sugar, any type of syrup, agave nectar, etc.   . Avoid snacking in between meals . Avoid foods made with flour o If you are going to eat food made with flour, choose those made with whole-grains; and, minimize your consumption as much as is tolerable . Avoid processed foods o These foods are generally stocked in the middle of the grocery store. Focus on shopping on the perimeter of the grocery.  . Avoid Meat  o We recommend following a plant-based diet at [name]. Thus, we recommend avoiding meat as a general rule. Consider eating beans, legumes, eggs, and/or dairy products for regular protein sources o If you plan on eating meat limit to 4 ounces of meat at a time and choose lean options such as Fish, chicken, turkey. Avoid red meat intake such as pork and/or steak What to Include . Vegetables o GREEN LEAFY VEGETABLES: Kale, spinach, mustard greens, collard greens, cabbage, broccoli, etc. o OTHER: Asparagus, cauliflower, eggplant, carrots, peas, Brussel sprouts, tomatoes, bell peppers, zucchini, beets, cucumbers, etc. . Grains, seeds, and legumes o Beans: kidney beans, black eyed peas, garbanzo beans, black beans, pinto beans, etc. o Whole, unrefined grains: brown rice, barley, bulgur, oatmeal, etc. . Healthy fats  o Avoid highly processed fats such as vegetable oil o Examples of healthy fats: avocado, olives, virgin olive oil, dark chocolate (?72% Cocoa), nuts (peanuts, almonds, walnuts, cashews, pecans, etc.) . None to Low  Intake of Animal Sources of Protein o Meat sources: chicken, turkey, salmon, tuna. Limit to 4 ounces of meat at one time. o Consider limiting dairy sources, but when choosing dairy focus on: PLAIN Greek yogurt, cottage cheese, high-protein milk . Fruit o Choose berries  When to Eat . Intermittent Fasting: o Choosing not to eat for a specific time period, but DO FOCUS ON HYDRATION when fasting o Multiple Techniques: - Time Restricted Eating: eat 3 meals in a day, each meal lasting no more than 60 minutes, no snacks between meals - 16-18 hour fast: fast for 16 to 18 hours up to 7 days a week. Often suggested to start with 2-3 nonconsecutive days per week.  . Remember the time you sleep is counted as fasting.  . Examples of eating schedule: Fast from 7:00pm-11:00am. Eat between 11:00am-7:00pm.  - 24-hour fast: fast for 24 hours up to every other day. Often suggested to start with 1 day per week . Remember the time you sleep is counted as fasting . Examples of eating schedule:  o Eating day: eat 2-3 meals on your eating day. If doing 2 meals, each meal should last no more than 90 minutes. If doing 3 meals, each meal should last no more than 60 minutes. Finish last meal by 7:00pm. o Fasting day: Fast until 7:00pm.  o IF YOU FEEL UNWELL FOR ANY REASON/IN ANY WAY WHEN FASTING, STOP FASTING BY EATING A NUTRITIOUS SNACK OR LIGHT MEAL o ALWAYS FOCUS ON HYDRATION DURING FASTS - Acceptable Hydration sources: water, broths, tea/coffee (black tea/coffee is best but using a small amount of whole-fat dairy products in coffee/tea is acceptable).  -   Poor Hydration Sources: anything with sugar or artificial sweeteners added to it  These recommendations have been developed for patients that are actively receiving medical care from either Dr. Kendyl Bissonnette or Sarah Gray, DNP, NP-C at [name]. These recommendations are developed for patients with specific medical conditions and are not meant to be  distributed or used by others that are not actively receiving care from either provider listed above at [facility]. It is not appropriate to participate in the above eating plans without proper medical supervision.   Reference: Fung, J. The obesity code. [place]: [place]; 2016.   

## 2020-04-13 NOTE — Progress Notes (Signed)
Metrics: Intervention Frequency ACO  Documented Smoking Status Yearly  Screened one or more times in 24 months  Cessation Counseling or  Active cessation medication Past 24 months  Past 24 months   Guideline developer: UpToDate (See UpToDate for funding source) Date Released: 2014       Wellness Office Visit  Subjective:  Patient ID: Ana [name], female    DOB: [birth year]  Age: 50 y.o. MRN: [id number]  CC: This lady comes in to discuss her uncontrolled diabetes, hypertension and dyslipidemia. HPI  On the last visit, I was concerned about her uncontrolled hypertension and added hydralazine to her blood pressure medications.  Unfortunately, she has not taken her losartan today but has been compliant mostly with all medications including losartan as well as newly prescribed hydralazine. I discussed her blood work with her which shows uncontrolled diabetes.  She continues on glipizide 5 mg twice a day. She is also chemically hypothyroid with elevated TSH levels. Past Medical History:  Diagnosis Date  . Diabetes mellitus    diet controlled  . Hypertension    Past Surgical History:  Procedure Laterality Date  . CESAREAN SECTION  x4  . CHOLECYSTECTOMY    . MASS EXCISION  10/27/2011   Procedure: EXCISION MASS;  Surgeon: Donato Heinz, MD;  Location: [facility];  Service: General;  Laterality: Right;  . TUBAL LIGATION       Family History  Problem Relation Age of Onset  . Kidney failure Mother   . Diabetes Mother   . Kidney disease Mother   . Kidney failure Father   . Kidney disease Father   . Kidney failure Brother   . Diabetes Brother   . Diabetes Brother     Social History   Social History Narrative   Divorced,married for 8 years.Lives with daughter and 3 grandkids.Unemployed.   Social History   Tobacco Use  . Smoking status: Never Smoker  . Smokeless tobacco: Never Used  Substance Use Topics  . Alcohol use: Yes    Comment: occ    Current Meds  Medication Sig   . ACCU-CHEK GUIDE test strip daily.  . Accu-Chek Softclix Lancets lancets daily.  Marland Kitchen amLODipine (NORVASC) 10 MG tablet Take 1 tablet (10 mg total) by mouth daily.  . blood glucose meter kit and supplies KIT E11.9  . blood glucose meter kit and supplies Dispense based on patient and insurance preference. Use to check your blood glucose daily.  . Blood Pressure Monitoring (SPHYGMOMANOMETER) MISC 1 each by Does not apply route daily.  . Cholecalciferol (VITAMIN D3) 125 MCG (5000 UT) CAPS Take 1 capsule by mouth daily.  Marland Kitchen glipiZIDE (GLUCOTROL) 10 MG tablet Take 1 tablet (10 mg total) by mouth 2 (two) times daily before a meal.  . glipiZIDE (GLUCOTROL) 5 MG tablet TAKE 1 TABLET(5 MG) BY MOUTH TWICE DAILY BEFORE A MEAL  . hydrALAZINE (APRESOLINE) 25 MG tablet Take 1 tablet (25 mg total) by mouth 3 (three) times daily.  Marland Kitchen levothyroxine (SYNTHROID) 50 MCG tablet Take 1 tablet (50 mcg total) by mouth daily.  Marland Kitchen losartan (COZAAR) 100 MG tablet TAKE 1 TABLET(100 MG) BY MOUTH DAILY     Flowsheet Row Office Visit from 09/17/2019 in [facility]  PHQ-9 Total Score 6      Objective:   Today's Vitals: BP (!) 150/100   Pulse 95   Ht 5' (1.524 m)   Wt 188 lb 9.6 oz (85.5 kg)   SpO2 99%   BMI 36.83 kg/m  Vitals with BMI 04/13/2020 03/31/2020 03/18/2020  Height _0  - _1   Weight 188 lbs 10 oz - 182 lbs 3 oz  BMI 59.93 - 57.01  Systolic 779 390 300  Diastolic 923 76 300  Pulse 95 - 89     Physical Exam   She remains morbidly obese.  Her blood pressure in fact is actually better than it was last time even though it is not controlled today.  However, she has not taking her losartan this morning.    Assessment   1. Uncontrolled type 2 diabetes mellitus with hyperglycemia ([place])   2. Essential hypertension, benign   3. Mixed hyperlipidemia   4. Hypothyroidism, adult       Tests ordered No orders of the defined types were placed in this encounter.    Plan: 1. Her diabetes  is not controlled and first we will increase the glipizide to 10 mg twice a day as the maximum dose.  I have given her diet sheet to focus on nutrition.  We discussed intermittent fasting also. 2. Hypertension is probably better if she had taken the losartan this morning.  Continue with the same medications. 3. I will start her on levothyroxine 50 mcg daily for her hypothyroidism. 4. She will follow-up with Judson Roch in about 3 months.   Meds ordered this encounter  Medications  . levothyroxine (SYNTHROID) 50 MCG tablet    Sig: Take 1 tablet (50 mcg total) by mouth daily.    Dispense:  30 tablet    Refill:  3  . glipiZIDE (GLUCOTROL) 10 MG tablet    Sig: Take 1 tablet (10 mg total) by mouth 2 (two) times daily before a meal.    Dispense:  60 tablet    Refill:  3    Jahid Weida Luther Parody, MD

## 2020-04-14 DIAGNOSIS — E1165 Type 2 diabetes mellitus with hyperglycemia: Secondary | ICD-10-CM | POA: Diagnosis not present

## 2020-04-15 DIAGNOSIS — E1165 Type 2 diabetes mellitus with hyperglycemia: Secondary | ICD-10-CM | POA: Diagnosis not present

## 2020-04-16 DIAGNOSIS — E1165 Type 2 diabetes mellitus with hyperglycemia: Secondary | ICD-10-CM | POA: Diagnosis not present

## 2020-04-17 DIAGNOSIS — E1165 Type 2 diabetes mellitus with hyperglycemia: Secondary | ICD-10-CM | POA: Diagnosis not present

## 2020-04-18 DIAGNOSIS — E1165 Type 2 diabetes mellitus with hyperglycemia: Secondary | ICD-10-CM | POA: Diagnosis not present

## 2020-04-19 DIAGNOSIS — E1165 Type 2 diabetes mellitus with hyperglycemia: Secondary | ICD-10-CM | POA: Diagnosis not present

## 2020-04-20 DIAGNOSIS — E1165 Type 2 diabetes mellitus with hyperglycemia: Secondary | ICD-10-CM | POA: Diagnosis not present

## 2020-04-21 ENCOUNTER — Ambulatory Visit (INDEPENDENT_AMBULATORY_CARE_PROVIDER_SITE_OTHER): Payer: Medicaid Other | Admitting: Nurse Practitioner

## 2020-04-21 DIAGNOSIS — E1165 Type 2 diabetes mellitus with hyperglycemia: Secondary | ICD-10-CM | POA: Diagnosis not present

## 2020-04-22 DIAGNOSIS — E1165 Type 2 diabetes mellitus with hyperglycemia: Secondary | ICD-10-CM | POA: Diagnosis not present

## 2020-04-23 ENCOUNTER — Other Ambulatory Visit (INDEPENDENT_AMBULATORY_CARE_PROVIDER_SITE_OTHER): Payer: Self-pay | Admitting: Internal Medicine

## 2020-04-25 DIAGNOSIS — E1165 Type 2 diabetes mellitus with hyperglycemia: Secondary | ICD-10-CM | POA: Diagnosis not present

## 2020-04-26 DIAGNOSIS — E1165 Type 2 diabetes mellitus with hyperglycemia: Secondary | ICD-10-CM | POA: Diagnosis not present

## 2020-04-27 DIAGNOSIS — E1165 Type 2 diabetes mellitus with hyperglycemia: Secondary | ICD-10-CM | POA: Diagnosis not present

## 2020-04-28 DIAGNOSIS — E1165 Type 2 diabetes mellitus with hyperglycemia: Secondary | ICD-10-CM | POA: Diagnosis not present

## 2020-04-29 DIAGNOSIS — E1165 Type 2 diabetes mellitus with hyperglycemia: Secondary | ICD-10-CM | POA: Diagnosis not present

## 2020-04-30 ENCOUNTER — Other Ambulatory Visit: Payer: Self-pay

## 2020-04-30 ENCOUNTER — Emergency Department (HOSPITAL_COMMUNITY)
Admission: EM | Admit: 2020-04-30 | Discharge: 2020-04-30 | Disposition: A | Discharge status: Home / Self Care | Payer: Medicaid Other | Attending: Emergency Medicine | Admitting: Emergency Medicine

## 2020-04-30 ENCOUNTER — Emergency Department (HOSPITAL_COMMUNITY): Payer: Medicaid Other

## 2020-04-30 DIAGNOSIS — I160 Hypertensive urgency: Secondary | ICD-10-CM

## 2020-04-30 DIAGNOSIS — R109 Unspecified abdominal pain: Secondary | ICD-10-CM | POA: Diagnosis not present

## 2020-04-30 DIAGNOSIS — E0865 Diabetes mellitus due to underlying condition with hyperglycemia: Secondary | ICD-10-CM

## 2020-04-30 DIAGNOSIS — Z79899 Other long term (current) drug therapy: Secondary | ICD-10-CM | POA: Diagnosis not present

## 2020-04-30 DIAGNOSIS — N1832 Chronic kidney disease, stage 3b: Secondary | ICD-10-CM

## 2020-04-30 DIAGNOSIS — M545 Low back pain, unspecified: Secondary | ICD-10-CM | POA: Diagnosis present

## 2020-04-30 DIAGNOSIS — I10 Essential (primary) hypertension: Secondary | ICD-10-CM | POA: Insufficient documentation

## 2020-04-30 DIAGNOSIS — N3289 Other specified disorders of bladder: Secondary | ICD-10-CM | POA: Diagnosis not present

## 2020-04-30 DIAGNOSIS — E1165 Type 2 diabetes mellitus with hyperglycemia: Secondary | ICD-10-CM | POA: Diagnosis not present

## 2020-04-30 DIAGNOSIS — K5730 Diverticulosis of large intestine without perforation or abscess without bleeding: Secondary | ICD-10-CM | POA: Diagnosis not present

## 2020-04-30 DIAGNOSIS — Z7984 Long term (current) use of oral hypoglycemic drugs: Secondary | ICD-10-CM | POA: Insufficient documentation

## 2020-04-30 DIAGNOSIS — R030 Elevated blood-pressure reading, without diagnosis of hypertension: Secondary | ICD-10-CM

## 2020-04-30 DIAGNOSIS — I878 Other specified disorders of veins: Secondary | ICD-10-CM | POA: Diagnosis not present

## 2020-04-30 DIAGNOSIS — M47816 Spondylosis without myelopathy or radiculopathy, lumbar region: Secondary | ICD-10-CM | POA: Diagnosis not present

## 2020-04-30 DIAGNOSIS — I129 Hypertensive chronic kidney disease with stage 1 through stage 4 chronic kidney disease, or unspecified chronic kidney disease: Secondary | ICD-10-CM | POA: Diagnosis not present

## 2020-04-30 LAB — URINALYSIS, ROUTINE W REFLEX MICROSCOPIC
Bilirubin Urine: NEGATIVE
Glucose, UA: >=500 mg/dL — AB
Ketones, ur: NEGATIVE mg/dL
Nitrite: NEGATIVE
Protein, ur: 100 mg/dL — AB
Specific Gravity, Urine: 1.008 (ref 1.005–1.030)
pH: 6 (ref 5.0–8.0)

## 2020-04-30 LAB — CBC WITH DIFFERENTIAL/PLATELET
Abs Immature Granulocytes: 0.03 10*3/uL (ref 0.00–0.07)
Basophils Absolute: 0 10*3/uL (ref 0.0–0.1)
Basophils Relative: 1 %
Eosinophils Absolute: 0.2 10*3/uL (ref 0.0–0.5)
Eosinophils Relative: 3 %
HCT: 31.7 % — ABNORMAL LOW (ref 36.0–46.0)
Hemoglobin: 10.2 g/dL — ABNORMAL LOW (ref 12.0–15.0)
Immature Granulocytes: 0 %
Lymphocytes Relative: 20 %
Lymphs Abs: 1.5 10*3/uL (ref 0.7–4.0)
MCH: 28.3 pg (ref 26.0–34.0)
MCHC: 32.2 g/dL (ref 30.0–36.0)
MCV: 88.1 fL (ref 80.0–100.0)
Monocytes Absolute: 0.7 10*3/uL (ref 0.1–1.0)
Monocytes Relative: 9 %
Neutro Abs: 5 10*3/uL (ref 1.7–7.7)
Neutrophils Relative %: 67 %
Platelets: 353 10*3/uL (ref 150–400)
RBC: 3.6 MIL/uL — ABNORMAL LOW (ref 3.87–5.11)
RDW: 12.3 % (ref 11.5–15.5)
WBC: 7.4 10*3/uL (ref 4.0–10.5)
nRBC: 0 % (ref 0.0–0.2)

## 2020-04-30 LAB — COMPREHENSIVE METABOLIC PANEL
ALT: 17 U/L (ref 0–44)
AST: 15 U/L (ref 15–41)
Albumin: 3.3 g/dL — ABNORMAL LOW (ref 3.5–5.0)
Alkaline Phosphatase: 121 U/L (ref 38–126)
Anion gap: 9 (ref 5–15)
BUN: 35 mg/dL — ABNORMAL HIGH (ref 6–20)
CO2: 20 mmol/L — ABNORMAL LOW (ref 22–32)
Calcium: 8.9 mg/dL (ref 8.9–10.3)
Chloride: 106 mmol/L (ref 98–111)
Creatinine, Ser: 1.81 mg/dL — ABNORMAL HIGH (ref 0.44–1.00)
GFR, Estimated: 34 mL/min — ABNORMAL LOW (ref >60)
Glucose, Bld: 229 mg/dL — ABNORMAL HIGH (ref 70–99)
Potassium: 4.3 mmol/L (ref 3.5–5.1)
Sodium: 135 mmol/L (ref 135–145)
Total Bilirubin: 0.2 mg/dL — ABNORMAL LOW (ref 0.3–1.2)
Total Protein: 7.4 g/dL (ref 6.5–8.1)

## 2020-04-30 LAB — LIPASE, BLOOD: Lipase: 30 U/L (ref 11–51)

## 2020-04-30 MED ORDER — HYDROCODONE-ACETAMINOPHEN 5-325 MG PO TABS: 1.0000 | ORAL_TABLET | Freq: Four times a day (QID) | ORAL | 0 refills | Status: DC | PRN

## 2020-04-30 MED ORDER — CEPHALEXIN 500 MG PO CAPS
1000.0000 mg | ORAL_CAPSULE | Freq: Once | ORAL | Status: AC
  Administered 2020-04-30: 1000 mg via ORAL
  Filled 2020-04-30: qty 2

## 2020-04-30 MED ORDER — CEPHALEXIN 500 MG PO CAPS: 500.0000 mg | ORAL_CAPSULE | Freq: Four times a day (QID) | ORAL | 0 refills | Status: DC

## 2020-04-30 MED ORDER — OXYCODONE-ACETAMINOPHEN 5-325 MG PO TABS
2.0000 | ORAL_TABLET | Freq: Once | ORAL | Status: AC
  Administered 2020-04-30: 2 via ORAL
  Filled 2020-04-30: qty 2

## 2020-04-30 MED ORDER — NAPROXEN 500 MG PO TABS: 500.0000 mg | ORAL_TABLET | Freq: Two times a day (BID) | ORAL | 0 refills | Status: DC

## 2020-04-30 NOTE — Discharge Instructions (Addendum)
It was our pleasure to provide your ER care today - we hope that you feel better.  Take keflex (antibiotic) as prescribed for possible uti.   You may take hydrocodone as prescribed as needed for pain - no driving for the next 6 hours, or when taking hydrocodone.   Your blood pressure, blood sugar, and kidney tests are elevated.  It is critically important that you work with your doctor to improve your blood pressure and diabetes control, otherwise it will lead to worsening kidney function, heart disease, and strokes.  Be sure to take your meds as prescribed, avoid taking any anti-inflammatory meds like ibuprofen/motrin or aleve/naprosyn, eat heart healthy diabetes meal plan, and follow up with your doctor in the coming week.   Return to ER if worse, new symptoms, fevers, new, worsening or severe pain, trouble breathing, or other concern.

## 2020-04-30 NOTE — ED Provider Notes (Signed)
[facility] Provider Note   CSN: [id number] Arrival date & time: 04/30/20  9833     History No chief complaint on file.   Ana [name] is a 50 y.o. female.  Patient is a 50 year old female with history of diabetes and hypertension.  She presents today for evaluation of back pain.  The pain started yesterday afternoon in the absence of any injury or trauma.  It has persisted throughout the night and has been unrelieved with ibuprofen and Aleve.  She denies any bowel or bladder complaints.  She denies any blood in her urine.  She does report pain that is worse when she moves and changes position.  She denies any fevers or chills.  The history is provided by the patient.       Past Medical History:  Diagnosis Date  . Diabetes mellitus    diet controlled  . Hypertension     Patient Active Problem List   Diagnosis Date Noted  . Uncontrolled type 2 diabetes mellitus with hyperglycemia ([place]) 09/26/2017  . Essential hypertension, benign 09/26/2017  . Mixed hyperlipidemia 09/26/2017  . Class 2 severe obesity due to excess calories with serious comorbidity and body mass index (BMI) of 37.0 to 37.9 in adult [facility]) 09/26/2017    Past Surgical History:  Procedure Laterality Date  . CESAREAN SECTION  x4  . CHOLECYSTECTOMY    . MASS EXCISION  10/27/2011   Procedure: EXCISION MASS;  Surgeon: Donato Heinz, MD;  Location: [facility];  Service: General;  Laterality: Right;  . TUBAL LIGATION       OB History    Gravida  6   Para  5   Term      Preterm  5   AB  1   Living        SAB  1   IAB      Ectopic      Multiple      Live Births              Family History  Problem Relation Age of Onset  . Kidney failure Mother   . Diabetes Mother   . Kidney disease Mother   . Kidney failure Father   . Kidney disease Father   . Kidney failure Brother   . Diabetes Brother   . Diabetes Brother     Social History   Tobacco Use  . Smoking  status: Never Smoker  . Smokeless tobacco: Never Used  Vaping Use  . Vaping Use: Never used  Substance Use Topics  . Alcohol use: Yes    Comment: occ  . Drug use: No    Home Medications Prior to Admission medications   Medication Sig Start Date End Date Taking? Authorizing Provider  ACCU-CHEK GUIDE test strip daily. 10/15/19   [provider]  Accu-Chek Softclix Lancets lancets daily. 10/14/19   [provider]  amLODipine (NORVASC) 10 MG tablet TAKE 1 TABLET(10 MG) BY MOUTH DAILY 04/23/20   Hurshel Party C, MD  blood glucose meter kit and supplies KIT E11.9 10/14/19   Doree Albee, MD  blood glucose meter kit and supplies Dispense based on patient and insurance preference. Use to check your blood glucose daily. 10/15/19   Ailene Ards, NP  Blood Pressure Monitoring [facility]) MISC 1 each by Does not apply route daily. 10/16/19   Ailene Ards, NP  Cholecalciferol (VITAMIN D3) 125 MCG (5000 UT) CAPS Take 1 capsule by mouth daily.  [provider]  glipiZIDE (GLUCOTROL) 10 MG tablet Take 1 tablet (10 mg total) by mouth 2 (two) times daily before a meal. 04/13/20   Gosrani, Nimish C, MD  glipiZIDE (GLUCOTROL) 5 MG tablet TAKE 1 TABLET(5 MG) BY MOUTH TWICE DAILY BEFORE A MEAL 02/21/20   Gosrani, Nimish C, MD  hydrALAZINE (APRESOLINE) 25 MG tablet Take 1 tablet (25 mg total) by mouth 3 (three) times daily. 03/18/20   Doree Albee, MD  levothyroxine (SYNTHROID) 50 MCG tablet Take 1 tablet (50 mcg total) by mouth daily. 04/13/20   Doree Albee, MD  losartan (COZAAR) 100 MG tablet TAKE 1 TABLET(100 MG) BY MOUTH DAILY 01/12/20   Doree Albee, MD    Allergies    Ranitidine hcl  Review of Systems   Review of Systems  All other systems reviewed and are negative.   Physical Exam Updated Vital Signs BP (!) 190/92   Pulse 95   Temp 97.9 F (36.6 C)   Resp 17   Ht 5' (1.524 m)   Wt 85.5 kg   SpO2 95%   BMI 36.81 kg/m   Physical  Exam Vitals and nursing note reviewed.  Constitutional:      General: She is not in acute distress.    Appearance: She is well-developed and well-nourished. She is not diaphoretic.  HENT:     Head: Normocephalic and atraumatic.  Cardiovascular:     Rate and Rhythm: Normal rate and regular rhythm.     Heart sounds: No murmur heard. No friction rub. No gallop.   Pulmonary:     Effort: Pulmonary effort is normal. No respiratory distress.     Breath sounds: Normal breath sounds. No wheezing.  Abdominal:     General: Bowel sounds are normal. There is no distension.     Palpations: Abdomen is soft.     Tenderness: There is no abdominal tenderness. There is right CVA tenderness. There is no guarding or rebound.  Musculoskeletal:        General: Normal range of motion.     Cervical back: Normal range of motion and neck supple.  Skin:    General: Skin is warm and dry.  Neurological:     Mental Status: She is alert and oriented to person, place, and time.     Comments: Strength is 5 out of 5 in both lower extremities.  DTRs are 1+ and symmetrical in the patellar and Achilles tendons bilaterally.     ED Results / Procedures / Treatments   Labs (all labs ordered are listed, but only abnormal results are displayed) Labs Reviewed  LIPASE, BLOOD  COMPREHENSIVE METABOLIC PANEL  CBC WITH DIFFERENTIAL/PLATELET  URINALYSIS, ROUTINE W REFLEX MICROSCOPIC    EKG None  Radiology No results found.  Procedures Procedures   Medications Ordered in ED Medications  oxyCODONE-acetaminophen (PERCOCET/ROXICET) 5-325 MG per tablet 2 tablet (has no administration in time range)    ED Course  I have reviewed the triage vital signs and the nursing notes.  Pertinent labs & imaging results that were available during my care of the patient were reviewed by me and considered in my medical decision making (see chart for details).    MDM Rules/Calculators/A&P  Patient presenting here with  complaints of right flank pain that seems musculoskeletal in nature.  CT renal shows no evidence for renal calculus and laboratory studies are unremarkable.  Her pain is worse when she changes position.  Patient waiting for results of urinalysis.  Care  to be signed out to Dr. Ashok Cordia at shift change.  He will obtain the results of the urinalysis.  Discharge anticipated.  Final Clinical Impression(s) / ED Diagnoses Final diagnoses:  None    Rx / DC Orders ED Discharge Orders    None       Veryl Speak, MD 05/01/20 [phone]

## 2020-04-30 NOTE — ED Triage Notes (Addendum)
POV from home Lower right side back pain started this am. No known injury. Said she took both ibuprofen and aleve with no result. Educated on NSAIDs.  No numbness/tingling/weakness

## 2020-04-30 NOTE — ED Notes (Signed)
Encouraged pt for urine sample. Stated she will try in 10 min.

## 2020-04-30 NOTE — ED Provider Notes (Signed)
Signed out by Dr Stark Jock at [facility] that d/c instructions/plan are done, to check ua that is pending.   UA w possible uti (vs contam). Will culture and tx. No fever. No abd pain or nv. Keflex po. Po fluids.   Pt with hx htn and diabetes, and renal fxn sl increased from prior.  No nsaids.  Importance of bp and diabetes control - rec pcp f/u this week for recheck and discuss meds/med adjustment.   Return precautions provided.      Lajean Saver, MD 04/30/20 [phone]

## 2020-05-01 ENCOUNTER — Telehealth (INDEPENDENT_AMBULATORY_CARE_PROVIDER_SITE_OTHER): Payer: Self-pay | Admitting: Nurse Practitioner

## 2020-05-01 DIAGNOSIS — E1165 Type 2 diabetes mellitus with hyperglycemia: Secondary | ICD-10-CM | POA: Diagnosis not present

## 2020-05-01 NOTE — Telephone Encounter (Signed)
This patient was seen in the ER and they recommended she follow up with [place] soon. Please call her and offer to schedule her for an earlier appointment that she is already scheduled for. Ideally within the next 7-14 days. Thank you.

## 2020-05-03 NOTE — Telephone Encounter (Signed)
Done... Scheduled for Wed March 9th at 9:30

## 2020-05-05 ENCOUNTER — Ambulatory Visit (INDEPENDENT_AMBULATORY_CARE_PROVIDER_SITE_OTHER): Payer: Medicaid Other | Admitting: Internal Medicine

## 2020-05-06 ENCOUNTER — Other Ambulatory Visit: Payer: Self-pay

## 2020-05-06 ENCOUNTER — Encounter (INDEPENDENT_AMBULATORY_CARE_PROVIDER_SITE_OTHER): Payer: Self-pay | Admitting: Internal Medicine

## 2020-05-06 ENCOUNTER — Ambulatory Visit (INDEPENDENT_AMBULATORY_CARE_PROVIDER_SITE_OTHER): Payer: Medicaid Other | Admitting: Internal Medicine

## 2020-05-06 VITALS — BP 158/84 | HR 88 | Resp 18 | Ht 61.0 in | Wt 190.8 lb

## 2020-05-06 DIAGNOSIS — N1832 Chronic kidney disease, stage 3b: Secondary | ICD-10-CM | POA: Diagnosis not present

## 2020-05-06 DIAGNOSIS — E11319 Type 2 diabetes mellitus with unspecified diabetic retinopathy without macular edema: Secondary | ICD-10-CM | POA: Diagnosis not present

## 2020-05-06 DIAGNOSIS — E1165 Type 2 diabetes mellitus with hyperglycemia: Secondary | ICD-10-CM

## 2020-05-06 DIAGNOSIS — N390 Urinary tract infection, site not specified: Secondary | ICD-10-CM

## 2020-05-06 DIAGNOSIS — I10 Essential (primary) hypertension: Secondary | ICD-10-CM

## 2020-05-06 DIAGNOSIS — E039 Hypothyroidism, unspecified: Secondary | ICD-10-CM | POA: Diagnosis not present

## 2020-05-06 NOTE — Progress Notes (Signed)
WE need to get her re approved for CAP services at [facility] on [facility]. They denied; due to not enough information on pt when we  filled out forms.

## 2020-05-06 NOTE — Progress Notes (Signed)
Metrics: Intervention Frequency ACO  Documented Smoking Status Yearly  Screened one or more times in 24 months  Cessation Counseling or  Active cessation medication Past 24 months  Past 24 months   Guideline developer: UpToDate (See UpToDate for funding source) Date Released: 2014       Wellness Office Visit  Subjective:  Patient ID: Ana [name], female    DOB: January 02, 1971  Age: 50 y.o. MRN: [id number]  CC: Emergency room follow-up. HPI  This patient was in the ER with what appears to be UTI.  She was treated with antibiotics and seems to feel much improved. She has been taking higher dose of glipizide.  She has been consistent with her antihypertensive medications at higher doses. Past Medical History:  Diagnosis Date  . Diabetes mellitus    diet controlled  . Hypertension    Past Surgical History:  Procedure Laterality Date  . CESAREAN SECTION  x4  . CHOLECYSTECTOMY    . MASS EXCISION  10/27/2011   Procedure: EXCISION MASS;  Surgeon: Donato Heinz, MD;  Location: [facility];  Service: General;  Laterality: Right;  . TUBAL LIGATION       Family History  Problem Relation Age of Onset  . Kidney failure Mother   . Diabetes Mother   . Kidney disease Mother   . Kidney failure Father   . Kidney disease Father   . Kidney failure Brother   . Diabetes Brother   . Diabetes Brother     Social History   Social History Narrative   Divorced,married for 8 years.Lives with daughter and 3 grandkids.Unemployed.   Social History   Tobacco Use  . Smoking status: Never Smoker  . Smokeless tobacco: Never Used  Substance Use Topics  . Alcohol use: Yes    Comment: occ    Current Meds  Medication Sig  . ACCU-CHEK GUIDE test strip daily.  . Accu-Chek Softclix Lancets lancets daily.  Marland Kitchen amLODipine (NORVASC) 10 MG tablet TAKE 1 TABLET(10 MG) BY MOUTH DAILY (Patient taking differently: Take 10 mg by mouth daily.)  . blood glucose meter kit and supplies KIT E11.9  . blood glucose  meter kit and supplies Dispense based on patient and insurance preference. Use to check your blood glucose daily.  . Blood Pressure Monitoring (SPHYGMOMANOMETER) MISC 1 each by Does not apply route daily.  . cephALEXin (KEFLEX) 500 MG capsule Take 1 capsule (500 mg total) by mouth 4 (four) times daily.  . Cholecalciferol (VITAMIN D3) 125 MCG (5000 UT) CAPS Take 1 capsule by mouth daily.  Marland Kitchen glipiZIDE (GLUCOTROL) 10 MG tablet Take 1 tablet (10 mg total) by mouth 2 (two) times daily before a meal.  . hydrALAZINE (APRESOLINE) 25 MG tablet Take 1 tablet (25 mg total) by mouth 3 (three) times daily.  Marland Kitchen HYDROcodone-acetaminophen (NORCO) 5-325 MG tablet Take 1-2 tablets by mouth every 6 (six) hours as needed.  Marland Kitchen levothyroxine (SYNTHROID) 50 MCG tablet Take 1 tablet (50 mcg total) by mouth daily.  Marland Kitchen losartan (COZAAR) 100 MG tablet TAKE 1 TABLET(100 MG) BY MOUTH DAILY (Patient taking differently: Take 100 mg by mouth daily.)     [place] Office Visit from 09/17/2019 in [facility]  PHQ-9 Total Score 6      Objective:   Today's Vitals: BP (!) 158/84   Pulse 88   Resp 18   Ht 5' 1"  (1.549 m)   Wt 190 lb 12.8 oz (86.5 kg)   BMI 36.05 kg/m  Vitals with  BMI 05/06/2020 04/30/2020 04/30/2020  Height 5' 1"  - -  Weight 190 lbs 13 oz - -  BMI 20.26 - -  Systolic 691 675 612  Diastolic 84 80 82  Pulse 88 82 81     Physical Exam  Although her blood pressure is not controlled, it is much improved compared to the last visit.  She is afebrile.     Assessment   1. Uncontrolled type 2 diabetes mellitus with hyperglycemia ([place])   2. Essential hypertension, benign   3. Hypothyroidism, adult   4. Urinary tract infection without hematuria, site unspecified   5. Stage 3b chronic kidney disease ([place])   6. 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 For home use only DME Other see comment  . Urinalysis w microscopic + reflex cultur  . Ambulatory referral to Nephrology     Plan: 1. In view of her recent UTI, we will repeat urinalysis and make sure the urine has cleared. 2. I see that her renal function is abnormal and she has stage IIIb chronic kidney disease.  I will refer to nephrology for further evaluation. 3. She also needs help in terms of services at home and we will try and reapply for her. 4. Follow-up as scheduled with Judson Roch in May.   No orders of the defined types were placed in this encounter.   Doree Albee, MD

## 2020-05-07 DIAGNOSIS — E1165 Type 2 diabetes mellitus with hyperglycemia: Secondary | ICD-10-CM | POA: Diagnosis not present

## 2020-05-08 DIAGNOSIS — E1165 Type 2 diabetes mellitus with hyperglycemia: Secondary | ICD-10-CM | POA: Diagnosis not present

## 2020-05-09 DIAGNOSIS — E1165 Type 2 diabetes mellitus with hyperglycemia: Secondary | ICD-10-CM | POA: Diagnosis not present

## 2020-05-09 LAB — CULTURE INDICATED

## 2020-05-09 LAB — URINALYSIS W MICROSCOPIC + REFLEX CULTURE
Bilirubin Urine: NEGATIVE
Glucose, UA: NEGATIVE
Hyaline Cast: NONE SEEN /LPF
Ketones, ur: NEGATIVE
Nitrites, Initial: NEGATIVE
Specific Gravity, Urine: 1.008 (ref 1.001–1.03)
Squamous Epithelial / HPF: NONE SEEN /HPF (ref <=5)
WBC, UA: >=60 /HPF — AB (ref 0–5)
pH: 5.5 (ref 5.0–8.0)

## 2020-05-09 LAB — URINE CULTURE

## 2020-05-10 DIAGNOSIS — E1165 Type 2 diabetes mellitus with hyperglycemia: Secondary | ICD-10-CM | POA: Diagnosis not present

## 2020-05-10 NOTE — Progress Notes (Signed)
Please call the patient and ask her if she is feeling well with a urine infection.  The antibiotic possibly could have helped based on the sensitivities.  If she is not doing well, we need to repeat the urine culture.

## 2020-05-10 NOTE — Progress Notes (Signed)
Pt was called and ask was she feeling better. Pt stated she was a lot better. She said it is helping. Ask her to continue to drink fluids.  Practice cleaning hygiene as well to wipe front to back when she goes to restroom to void/Elimite.

## 2020-05-11 DIAGNOSIS — E1165 Type 2 diabetes mellitus with hyperglycemia: Secondary | ICD-10-CM | POA: Diagnosis not present

## 2020-05-12 DIAGNOSIS — E113521 Type 2 diabetes mellitus with proliferative diabetic retinopathy with traction retinal detachment involving the macula, right eye: Secondary | ICD-10-CM | POA: Diagnosis not present

## 2020-05-12 DIAGNOSIS — E1165 Type 2 diabetes mellitus with hyperglycemia: Secondary | ICD-10-CM | POA: Diagnosis not present

## 2020-05-12 DIAGNOSIS — H4311 Vitreous hemorrhage, right eye: Secondary | ICD-10-CM | POA: Diagnosis not present

## 2020-05-13 DIAGNOSIS — E1165 Type 2 diabetes mellitus with hyperglycemia: Secondary | ICD-10-CM | POA: Diagnosis not present

## 2020-05-16 DIAGNOSIS — E1165 Type 2 diabetes mellitus with hyperglycemia: Secondary | ICD-10-CM | POA: Diagnosis not present

## 2020-05-17 DIAGNOSIS — E1165 Type 2 diabetes mellitus with hyperglycemia: Secondary | ICD-10-CM | POA: Diagnosis not present

## 2020-05-18 DIAGNOSIS — E1165 Type 2 diabetes mellitus with hyperglycemia: Secondary | ICD-10-CM | POA: Diagnosis not present

## 2020-05-19 DIAGNOSIS — E1165 Type 2 diabetes mellitus with hyperglycemia: Secondary | ICD-10-CM | POA: Diagnosis not present

## 2020-05-20 DIAGNOSIS — E1165 Type 2 diabetes mellitus with hyperglycemia: Secondary | ICD-10-CM | POA: Diagnosis not present

## 2020-05-23 DIAGNOSIS — E1165 Type 2 diabetes mellitus with hyperglycemia: Secondary | ICD-10-CM | POA: Diagnosis not present

## 2020-05-24 DIAGNOSIS — E1165 Type 2 diabetes mellitus with hyperglycemia: Secondary | ICD-10-CM | POA: Diagnosis not present

## 2020-05-25 DIAGNOSIS — E1165 Type 2 diabetes mellitus with hyperglycemia: Secondary | ICD-10-CM | POA: Diagnosis not present

## 2020-05-26 DIAGNOSIS — E1165 Type 2 diabetes mellitus with hyperglycemia: Secondary | ICD-10-CM | POA: Diagnosis not present

## 2020-05-27 DIAGNOSIS — E1165 Type 2 diabetes mellitus with hyperglycemia: Secondary | ICD-10-CM | POA: Diagnosis not present

## 2020-05-28 DIAGNOSIS — E1165 Type 2 diabetes mellitus with hyperglycemia: Secondary | ICD-10-CM | POA: Diagnosis not present

## 2020-05-29 DIAGNOSIS — E1165 Type 2 diabetes mellitus with hyperglycemia: Secondary | ICD-10-CM | POA: Diagnosis not present

## 2020-05-30 DIAGNOSIS — E1165 Type 2 diabetes mellitus with hyperglycemia: Secondary | ICD-10-CM | POA: Diagnosis not present

## 2020-05-31 DIAGNOSIS — E1165 Type 2 diabetes mellitus with hyperglycemia: Secondary | ICD-10-CM | POA: Diagnosis not present

## 2020-06-01 DIAGNOSIS — E1165 Type 2 diabetes mellitus with hyperglycemia: Secondary | ICD-10-CM | POA: Diagnosis not present

## 2020-06-02 DIAGNOSIS — E1165 Type 2 diabetes mellitus with hyperglycemia: Secondary | ICD-10-CM | POA: Diagnosis not present

## 2020-06-03 DIAGNOSIS — E1165 Type 2 diabetes mellitus with hyperglycemia: Secondary | ICD-10-CM | POA: Diagnosis not present

## 2020-06-04 DIAGNOSIS — E1165 Type 2 diabetes mellitus with hyperglycemia: Secondary | ICD-10-CM | POA: Diagnosis not present

## 2020-06-05 DIAGNOSIS — E1165 Type 2 diabetes mellitus with hyperglycemia: Secondary | ICD-10-CM | POA: Diagnosis not present

## 2020-06-06 DIAGNOSIS — E1165 Type 2 diabetes mellitus with hyperglycemia: Secondary | ICD-10-CM | POA: Diagnosis not present

## 2020-06-07 DIAGNOSIS — E1165 Type 2 diabetes mellitus with hyperglycemia: Secondary | ICD-10-CM | POA: Diagnosis not present

## 2020-06-08 DIAGNOSIS — E1165 Type 2 diabetes mellitus with hyperglycemia: Secondary | ICD-10-CM | POA: Diagnosis not present

## 2020-06-09 DIAGNOSIS — E1165 Type 2 diabetes mellitus with hyperglycemia: Secondary | ICD-10-CM | POA: Diagnosis not present

## 2020-06-09 DIAGNOSIS — I129 Hypertensive chronic kidney disease with stage 1 through stage 4 chronic kidney disease, or unspecified chronic kidney disease: Secondary | ICD-10-CM | POA: Diagnosis not present

## 2020-06-09 DIAGNOSIS — E1122 Type 2 diabetes mellitus with diabetic chronic kidney disease: Secondary | ICD-10-CM | POA: Diagnosis not present

## 2020-06-09 DIAGNOSIS — E1129 Type 2 diabetes mellitus with other diabetic kidney complication: Secondary | ICD-10-CM | POA: Diagnosis not present

## 2020-06-09 DIAGNOSIS — R809 Proteinuria, unspecified: Secondary | ICD-10-CM | POA: Diagnosis not present

## 2020-06-09 DIAGNOSIS — E6609 Other obesity due to excess calories: Secondary | ICD-10-CM | POA: Diagnosis not present

## 2020-06-09 DIAGNOSIS — Z79899 Other long term (current) drug therapy: Secondary | ICD-10-CM | POA: Diagnosis not present

## 2020-06-09 DIAGNOSIS — N189 Chronic kidney disease, unspecified: Secondary | ICD-10-CM | POA: Diagnosis not present

## 2020-06-09 DIAGNOSIS — E872 Acidosis: Secondary | ICD-10-CM | POA: Diagnosis not present

## 2020-06-09 DIAGNOSIS — D638 Anemia in other chronic diseases classified elsewhere: Secondary | ICD-10-CM | POA: Diagnosis not present

## 2020-06-10 ENCOUNTER — Other Ambulatory Visit: Payer: Self-pay | Admitting: Nephrology

## 2020-06-10 ENCOUNTER — Other Ambulatory Visit (HOSPITAL_COMMUNITY): Payer: Self-pay | Admitting: Nephrology

## 2020-06-10 DIAGNOSIS — E872 Acidosis, unspecified: Secondary | ICD-10-CM

## 2020-06-10 DIAGNOSIS — E1129 Type 2 diabetes mellitus with other diabetic kidney complication: Secondary | ICD-10-CM

## 2020-06-10 DIAGNOSIS — I129 Hypertensive chronic kidney disease with stage 1 through stage 4 chronic kidney disease, or unspecified chronic kidney disease: Secondary | ICD-10-CM

## 2020-06-10 DIAGNOSIS — E1122 Type 2 diabetes mellitus with diabetic chronic kidney disease: Secondary | ICD-10-CM

## 2020-06-10 DIAGNOSIS — R809 Proteinuria, unspecified: Secondary | ICD-10-CM

## 2020-06-10 DIAGNOSIS — E1165 Type 2 diabetes mellitus with hyperglycemia: Secondary | ICD-10-CM | POA: Diagnosis not present

## 2020-06-11 DIAGNOSIS — E1165 Type 2 diabetes mellitus with hyperglycemia: Secondary | ICD-10-CM | POA: Diagnosis not present

## 2020-06-12 DIAGNOSIS — E1165 Type 2 diabetes mellitus with hyperglycemia: Secondary | ICD-10-CM | POA: Diagnosis not present

## 2020-06-13 DIAGNOSIS — E1165 Type 2 diabetes mellitus with hyperglycemia: Secondary | ICD-10-CM | POA: Diagnosis not present

## 2020-06-14 ENCOUNTER — Ambulatory Visit (HOSPITAL_COMMUNITY)
Admission: RE | Admit: 2020-06-14 | Discharge: 2020-06-14 | Disposition: A | Discharge status: Home / Self Care | Payer: Medicaid Other | Source: Ambulatory Visit | Attending: Nephrology | Admitting: Nephrology

## 2020-06-14 ENCOUNTER — Other Ambulatory Visit: Payer: Self-pay

## 2020-06-14 DIAGNOSIS — E1122 Type 2 diabetes mellitus with diabetic chronic kidney disease: Secondary | ICD-10-CM | POA: Insufficient documentation

## 2020-06-14 DIAGNOSIS — E1129 Type 2 diabetes mellitus with other diabetic kidney complication: Secondary | ICD-10-CM | POA: Insufficient documentation

## 2020-06-14 DIAGNOSIS — I129 Hypertensive chronic kidney disease with stage 1 through stage 4 chronic kidney disease, or unspecified chronic kidney disease: Secondary | ICD-10-CM | POA: Diagnosis not present

## 2020-06-14 DIAGNOSIS — R809 Proteinuria, unspecified: Secondary | ICD-10-CM | POA: Insufficient documentation

## 2020-06-14 DIAGNOSIS — E872 Acidosis, unspecified: Secondary | ICD-10-CM

## 2020-06-14 DIAGNOSIS — E1165 Type 2 diabetes mellitus with hyperglycemia: Secondary | ICD-10-CM | POA: Diagnosis not present

## 2020-06-15 DIAGNOSIS — E1165 Type 2 diabetes mellitus with hyperglycemia: Secondary | ICD-10-CM | POA: Diagnosis not present

## 2020-06-16 DIAGNOSIS — E1165 Type 2 diabetes mellitus with hyperglycemia: Secondary | ICD-10-CM | POA: Diagnosis not present

## 2020-06-17 DIAGNOSIS — E1165 Type 2 diabetes mellitus with hyperglycemia: Secondary | ICD-10-CM | POA: Diagnosis not present

## 2020-06-18 DIAGNOSIS — E1165 Type 2 diabetes mellitus with hyperglycemia: Secondary | ICD-10-CM | POA: Diagnosis not present

## 2020-06-19 DIAGNOSIS — E1165 Type 2 diabetes mellitus with hyperglycemia: Secondary | ICD-10-CM | POA: Diagnosis not present

## 2020-06-20 DIAGNOSIS — E1165 Type 2 diabetes mellitus with hyperglycemia: Secondary | ICD-10-CM | POA: Diagnosis not present

## 2020-06-21 DIAGNOSIS — E1165 Type 2 diabetes mellitus with hyperglycemia: Secondary | ICD-10-CM | POA: Diagnosis not present

## 2020-06-22 DIAGNOSIS — E1165 Type 2 diabetes mellitus with hyperglycemia: Secondary | ICD-10-CM | POA: Diagnosis not present

## 2020-06-23 DIAGNOSIS — E1165 Type 2 diabetes mellitus with hyperglycemia: Secondary | ICD-10-CM | POA: Diagnosis not present

## 2020-06-24 DIAGNOSIS — E1165 Type 2 diabetes mellitus with hyperglycemia: Secondary | ICD-10-CM | POA: Diagnosis not present

## 2020-06-25 DIAGNOSIS — E1165 Type 2 diabetes mellitus with hyperglycemia: Secondary | ICD-10-CM | POA: Diagnosis not present

## 2020-06-26 DIAGNOSIS — E1165 Type 2 diabetes mellitus with hyperglycemia: Secondary | ICD-10-CM | POA: Diagnosis not present

## 2020-06-27 DIAGNOSIS — E1165 Type 2 diabetes mellitus with hyperglycemia: Secondary | ICD-10-CM | POA: Diagnosis not present

## 2020-06-28 DIAGNOSIS — E1165 Type 2 diabetes mellitus with hyperglycemia: Secondary | ICD-10-CM | POA: Diagnosis not present

## 2020-06-29 ENCOUNTER — Encounter (INDEPENDENT_AMBULATORY_CARE_PROVIDER_SITE_OTHER): Payer: Self-pay | Admitting: Nurse Practitioner

## 2020-06-29 DIAGNOSIS — E1165 Type 2 diabetes mellitus with hyperglycemia: Secondary | ICD-10-CM | POA: Diagnosis not present

## 2020-06-30 DIAGNOSIS — E1165 Type 2 diabetes mellitus with hyperglycemia: Secondary | ICD-10-CM | POA: Diagnosis not present

## 2020-07-01 DIAGNOSIS — E1165 Type 2 diabetes mellitus with hyperglycemia: Secondary | ICD-10-CM | POA: Diagnosis not present

## 2020-07-02 DIAGNOSIS — E1165 Type 2 diabetes mellitus with hyperglycemia: Secondary | ICD-10-CM | POA: Diagnosis not present

## 2020-07-03 DIAGNOSIS — E1165 Type 2 diabetes mellitus with hyperglycemia: Secondary | ICD-10-CM | POA: Diagnosis not present

## 2020-07-04 DIAGNOSIS — E1165 Type 2 diabetes mellitus with hyperglycemia: Secondary | ICD-10-CM | POA: Diagnosis not present

## 2020-07-05 DIAGNOSIS — E1165 Type 2 diabetes mellitus with hyperglycemia: Secondary | ICD-10-CM | POA: Diagnosis not present

## 2020-07-06 DIAGNOSIS — E1165 Type 2 diabetes mellitus with hyperglycemia: Secondary | ICD-10-CM | POA: Diagnosis not present

## 2020-07-07 DIAGNOSIS — E1165 Type 2 diabetes mellitus with hyperglycemia: Secondary | ICD-10-CM | POA: Diagnosis not present

## 2020-07-08 DIAGNOSIS — E1165 Type 2 diabetes mellitus with hyperglycemia: Secondary | ICD-10-CM | POA: Diagnosis not present

## 2020-07-09 DIAGNOSIS — E1165 Type 2 diabetes mellitus with hyperglycemia: Secondary | ICD-10-CM | POA: Diagnosis not present

## 2020-07-10 DIAGNOSIS — E1165 Type 2 diabetes mellitus with hyperglycemia: Secondary | ICD-10-CM | POA: Diagnosis not present

## 2020-07-11 DIAGNOSIS — E1165 Type 2 diabetes mellitus with hyperglycemia: Secondary | ICD-10-CM | POA: Diagnosis not present

## 2020-07-12 DIAGNOSIS — E1165 Type 2 diabetes mellitus with hyperglycemia: Secondary | ICD-10-CM | POA: Diagnosis not present

## 2020-07-13 ENCOUNTER — Ambulatory Visit (INDEPENDENT_AMBULATORY_CARE_PROVIDER_SITE_OTHER): Payer: Medicaid Other | Admitting: Nurse Practitioner

## 2020-07-13 DIAGNOSIS — E1165 Type 2 diabetes mellitus with hyperglycemia: Secondary | ICD-10-CM | POA: Diagnosis not present

## 2020-07-14 ENCOUNTER — Ambulatory Visit (INDEPENDENT_AMBULATORY_CARE_PROVIDER_SITE_OTHER): Payer: Medicaid Other | Admitting: Nurse Practitioner

## 2020-07-14 DIAGNOSIS — E1165 Type 2 diabetes mellitus with hyperglycemia: Secondary | ICD-10-CM | POA: Diagnosis not present

## 2020-07-15 DIAGNOSIS — D638 Anemia in other chronic diseases classified elsewhere: Secondary | ICD-10-CM | POA: Diagnosis not present

## 2020-07-15 DIAGNOSIS — N189 Chronic kidney disease, unspecified: Secondary | ICD-10-CM | POA: Diagnosis not present

## 2020-07-15 DIAGNOSIS — E872 Acidosis: Secondary | ICD-10-CM | POA: Diagnosis not present

## 2020-07-15 DIAGNOSIS — E1165 Type 2 diabetes mellitus with hyperglycemia: Secondary | ICD-10-CM | POA: Diagnosis not present

## 2020-07-15 DIAGNOSIS — E1129 Type 2 diabetes mellitus with other diabetic kidney complication: Secondary | ICD-10-CM | POA: Diagnosis not present

## 2020-07-15 DIAGNOSIS — I129 Hypertensive chronic kidney disease with stage 1 through stage 4 chronic kidney disease, or unspecified chronic kidney disease: Secondary | ICD-10-CM | POA: Diagnosis not present

## 2020-07-15 DIAGNOSIS — E1122 Type 2 diabetes mellitus with diabetic chronic kidney disease: Secondary | ICD-10-CM | POA: Diagnosis not present

## 2020-07-15 DIAGNOSIS — R809 Proteinuria, unspecified: Secondary | ICD-10-CM | POA: Diagnosis not present

## 2020-07-15 DIAGNOSIS — Z79899 Other long term (current) drug therapy: Secondary | ICD-10-CM | POA: Diagnosis not present

## 2020-07-16 DIAGNOSIS — E1165 Type 2 diabetes mellitus with hyperglycemia: Secondary | ICD-10-CM | POA: Diagnosis not present

## 2020-07-17 DIAGNOSIS — E1165 Type 2 diabetes mellitus with hyperglycemia: Secondary | ICD-10-CM | POA: Diagnosis not present

## 2020-07-18 DIAGNOSIS — E1165 Type 2 diabetes mellitus with hyperglycemia: Secondary | ICD-10-CM | POA: Diagnosis not present

## 2020-07-19 DIAGNOSIS — E1165 Type 2 diabetes mellitus with hyperglycemia: Secondary | ICD-10-CM | POA: Diagnosis not present

## 2020-07-20 DIAGNOSIS — E1165 Type 2 diabetes mellitus with hyperglycemia: Secondary | ICD-10-CM | POA: Diagnosis not present

## 2020-07-21 DIAGNOSIS — E1165 Type 2 diabetes mellitus with hyperglycemia: Secondary | ICD-10-CM | POA: Diagnosis not present

## 2020-07-22 DIAGNOSIS — E1165 Type 2 diabetes mellitus with hyperglycemia: Secondary | ICD-10-CM | POA: Diagnosis not present

## 2020-07-23 DIAGNOSIS — E1165 Type 2 diabetes mellitus with hyperglycemia: Secondary | ICD-10-CM | POA: Diagnosis not present

## 2020-07-24 DIAGNOSIS — E1165 Type 2 diabetes mellitus with hyperglycemia: Secondary | ICD-10-CM | POA: Diagnosis not present

## 2020-07-25 DIAGNOSIS — E1165 Type 2 diabetes mellitus with hyperglycemia: Secondary | ICD-10-CM | POA: Diagnosis not present

## 2020-07-26 DIAGNOSIS — E1165 Type 2 diabetes mellitus with hyperglycemia: Secondary | ICD-10-CM | POA: Diagnosis not present

## 2020-07-27 DIAGNOSIS — E1165 Type 2 diabetes mellitus with hyperglycemia: Secondary | ICD-10-CM | POA: Diagnosis not present

## 2020-07-28 DIAGNOSIS — E1165 Type 2 diabetes mellitus with hyperglycemia: Secondary | ICD-10-CM | POA: Diagnosis not present

## 2020-07-29 ENCOUNTER — Encounter (INDEPENDENT_AMBULATORY_CARE_PROVIDER_SITE_OTHER): Payer: Self-pay | Admitting: *Deleted

## 2020-07-29 ENCOUNTER — Encounter (INDEPENDENT_AMBULATORY_CARE_PROVIDER_SITE_OTHER): Payer: Self-pay | Admitting: Nurse Practitioner

## 2020-07-29 ENCOUNTER — Ambulatory Visit (INDEPENDENT_AMBULATORY_CARE_PROVIDER_SITE_OTHER): Payer: Medicaid Other | Admitting: Nurse Practitioner

## 2020-07-29 ENCOUNTER — Other Ambulatory Visit: Payer: Self-pay

## 2020-07-29 VITALS — BP 150/78 | HR 89 | Temp 97.3°F | Ht 61.0 in | Wt 192.6 lb

## 2020-07-29 DIAGNOSIS — I10 Essential (primary) hypertension: Secondary | ICD-10-CM | POA: Diagnosis not present

## 2020-07-29 DIAGNOSIS — E1165 Type 2 diabetes mellitus with hyperglycemia: Secondary | ICD-10-CM

## 2020-07-29 MED ORDER — BLOOD GLUCOSE MONITOR KIT: PACK | 0 refills | Status: DC

## 2020-07-29 MED ORDER — RYBELSUS 3 MG PO TABS: 1.0000 | ORAL_TABLET | Freq: Every day | ORAL | 1 refills | Status: DC

## 2020-07-29 NOTE — Patient Instructions (Signed)
Semaglutide Oral Tablets What is this medicine? SEMAGLUTIDE (Sem a GLOO tide) controls blood sugar in people with type 2 diabetes. It is used with lifestyle changes like diet and exercise. This medicine may be used for other purposes; ask your health care provider or pharmacist if you have questions. COMMON BRAND NAME(S): Rybelsus What should I tell my health care provider before I take this medicine? They need to know if you have any of these conditions:  endocrine tumors (MEN 2) or if someone in your family had these tumors  eye disease  history of pancreatitis  kidney disease  stomach or intestine problems  thyroid cancer or if someone in your family had thyroid cancer  vision problems  an unusual or allergic reaction to semaglutide, other medicines, foods, dyes, or preservatives  pregnant or trying to get pregnant  breast-feeding How should I use this medicine? Take this medicine by mouth. Take it as directed on the prescription label at the same time every day. Take the dose right after waking up. Do not eat or drink anything before taking it. Do not take it with any other drink except a glass of plain water that is less than 4 ounces (less than 120 mL). Do not cut, crush or chew this medicine. Swallow the tablets whole. After taking it, do not eat breakfast, drink, or take any other medicines or vitamins for at least 30 minutes. Keep taking it unless your health care provider tells you to stop. A special MedGuide will be given to you by the pharmacist with each prescription and refill. Be sure to read this information carefully each time. Talk to your health care provider about the use of this medicine in children. Special care may be needed. Overdosage: If you think you have taken too much of this medicine contact a poison control center or emergency room at once. NOTE: This medicine is only for you. Do not share this medicine with others. What if I miss a dose? If you miss a  dose, skip it. Take your next dose at the normal time. Do not take extra or 2 doses at the same time to make up for the missed dose. What may interact with this medicine? What may interact with this medicine?  aminophylline  carbamazepine  cyclosporine  digoxin  levothyroxine  other medicines for diabetes  phenytoin  tacrolimus  theophylline  warfarin Many medications may cause changes in blood sugar, these include:  alcohol containing beverages  antiviral medicines for HIV or AIDS  aspirin and aspirin-like drugs  certain medicines for blood pressure, heart disease, irregular heart beat  chromium  diuretics  female hormones, such as estrogens or progestins, birth control pills  fenofibrate  gemfibrozil  isoniazid  lanreotide  female hormones or anabolic steroids  MAOIs like Carbex, Eldepryl, Marplan, Nardil, and Parnate  medicines for weight loss  medicines for allergies, asthma, cold, or cough  medicines for depression, anxiety, or psychotic disturbances  niacin  nicotine  NSAIDs, medicines for pain and inflammation, like ibuprofen or naproxen  octreotide  pasireotide  pentamidine  phenytoin  probenecid  quinolone antibiotics such as ciprofloxacin, levofloxacin, ofloxacin  some herbal dietary supplements  steroid medicines such as prednisone or cortisone  sulfamethoxazole; trimethoprim  thyroid hormones Some medications can hide the warning symptoms of low blood sugar (hypoglycemia). You may need to monitor your blood sugar more closely if you are taking one of these medications. These include:  beta-blockers, often used for high blood pressure or heart problems (  examples include atenolol, metoprolol, propranolol)  clonidine  guanethidine  reserpine This list may not describe all possible interactions. Give your health care provider a list of all the medicines, herbs, non-prescription drugs, or dietary supplements you use. Also  tell them if you smoke, drink alcohol, or use illegal drugs. Some items may interact with your medicine. What should I watch for while using this medicine? Visit your health care provider for regular checks on your progress. Check with your health care provider if you have severe diarrhea, nausea, and vomiting, or if you sweat a lot. The loss of too much body fluid may make it dangerous for you to take this medicine. A test called the HbA1C (A1C) will be monitored. This is a simple blood test. It measures your blood sugar control over the last 2 to 3 months. You will receive this test every 3 to 6 months. Learn how to check your blood sugar. Learn the symptoms of low and high blood sugar and how to manage them. Always carry a quick-source of sugar with you in case you have symptoms of low blood sugar. Examples include hard sugar candy or glucose tablets. Make sure others know that you can choke if you eat or drink when you develop serious symptoms of low blood sugar, such as seizures or unconsciousness. Get medical help at once. Tell your health care provider if you have high blood sugar. You might need to change the dose of your medicine. If you are sick or exercising more than usual, you might need to change the dose of your medicine. Do not skip meals. Ask your health care provider if you should avoid alcohol. Many nonprescription cough and cold products contain sugar or alcohol. These can affect blood sugar. Wear a medical ID bracelet or chain. Carry a card that describes your condition. List the medicines and doses you take on the card. Do not become pregnant while taking this medicine. Women should inform their health care provider if they wish to become pregnant or think they might be pregnant. There is a potential for serious side effects to an unborn child. Talk to your health care provider for more information. Do not breast-feed an infant while taking this medicine. What side effects may I  notice from receiving this medicine? Side effects that you should report to your doctor or health care provider as soon as possible:  allergic reactions (skin rash, itching or hives; swelling of the face, lips, or tongue)  changes in vision  diarrhea that continues or is severe  infection (fever, chills, cough, sore throat, pain or trouble passing urine)  kidney injury (trouble passing urine or change in the amount of urine)  low blood sugar (feeling anxious; confusion; dizziness; increased hunger; unusually weak or tired; increased sweating; shakiness; cold, clammy skin; irritable; headache; blurred vision; fast heartbeat; loss of consciousness)  lump or swelling on the neck  painful or difficulty swallowing  severe nausea  severe or unusual stomach pain  trouble breathing  vomiting Side effects that usually do not require medical attention (report these to your doctor or health care provider if they continue or are bothersome):  constipation  diarrhea  nausea  upset stomach This list may not describe all possible side effects. Call your doctor for medical advice about side effects. You may report side effects to FDA at 1-800-FDA-1088. Where should I keep my medicine? Keep out of the reach of children and pets. Store at room temperature between 20 and 25 degrees C (  68 and 77 degrees F). Keep this medicine in the original container. Protect from moisture. Keep the container tightly closed. Get rid of any unused medicine after the expiration date. To get rid of medicines that are no longer needed or have expired:  Take the medicine to a medicine take-back program. Check with your pharmacy or law enforcement to find a location.  If you cannot return the medicine, check the label or package insert to see if the medicine should be thrown out in the garbage or flushed down the toilet. If you are not sure, ask your health care provider. If it is safe to put it in the trash, take  the medicine out of the container. Mix the medicine with cat litter, dirt, coffee grounds, or other unwanted substance. Seal the mixture in a bag or container. Put it in the trash. NOTE: This sheet is a summary. It may not cover all possible information. If you have questions about this medicine, talk to your doctor, pharmacist, or health care provider.  2021 Elsevier/Gold Standard (2020-01-12 15:08:33)

## 2020-07-29 NOTE — Progress Notes (Signed)
Subjective:  Patient ID: Ana [name], female    DOB: 1970-06-17  Age: 50 y.o. MRN: [id number]  CC:  Chief Complaint  Patient presents with  . Follow-up    Doing okay  . Hypertension  . Diabetes      HPI  This patient arrives today for the above.  Hypertension: She continues on amlodipine, losartan, and hydralazine.  She tells me she saw her kidney specialist yesterday and they increased her hydralazine to 50 mg twice a day.  So far she is tolerating this increase well.  Diabetes: In our system last A1c was 11.2 this was collected back in January of this year.  Per patient and the patient's family member they had blood work collected at their kidney doctors office and A1c there was 10.2.  She currently is on glipizide 10 mg twice a day.  Due to her EGFR she does not qualify for metformin.  She also tells me that her glucometer recently broke so she is not been able to monitor her blood sugars at home on a regular basis.  She does tell me that she has not felt any symptoms of hypoglycemia.  Normally with a hypoglycemic episode she will feel weak.  Past Medical History:  Diagnosis Date  . CKD (chronic kidney disease)   . Diabetes mellitus    diet controlled  . Hypertension       Family History  Problem Relation Age of Onset  . Kidney failure Mother   . Diabetes Mother   . Kidney disease Mother   . Kidney failure Father   . Kidney disease Father   . Kidney failure Brother   . Diabetes Brother   . Diabetes Brother     Social History   Social History Narrative   Divorced,married for 8 years.Lives with daughter and 3 grandkids.Unemployed.   Social History   Tobacco Use  . Smoking status: Never Smoker  . Smokeless tobacco: Never Used  Substance Use Topics  . Alcohol use: Yes    Comment: occ     Current Meds  Medication Sig  . ACCU-CHEK GUIDE test strip daily.  . Accu-Chek Softclix Lancets lancets daily.  Marland Kitchen amLODipine (NORVASC) 10 MG tablet TAKE 1  TABLET(10 MG) BY MOUTH DAILY (Patient taking differently: Take 10 mg by mouth daily.)  . blood glucose meter kit and supplies KIT Dispense based on patient and insurance preference. Use daily to check your blood sugar.  . blood glucose meter kit and supplies Dispense based on patient and insurance preference. Use to check your blood glucose daily.  . Blood Pressure Monitoring (SPHYGMOMANOMETER) MISC 1 each by Does not apply route daily.  . Cholecalciferol 25 MCG (1000 UT) capsule Take by mouth.  Marland Kitchen glipiZIDE (GLUCOTROL) 10 MG tablet Take 1 tablet (10 mg total) by mouth 2 (two) times daily before a meal.  . hydrALAZINE (APRESOLINE) 50 MG tablet Take 50 mg by mouth 2 (two) times daily.  Marland Kitchen levothyroxine (SYNTHROID) 50 MCG tablet Take 1 tablet (50 mcg total) by mouth daily.  Marland Kitchen losartan (COZAAR) 100 MG tablet TAKE 1 TABLET(100 MG) BY MOUTH DAILY (Patient taking differently: Take 100 mg by mouth daily.)  . Semaglutide (RYBELSUS) 3 MG TABS Take 1 tablet by mouth daily.  . sodium bicarbonate 650 MG tablet Take by mouth.  . [DISCONTINUED] blood glucose meter kit and supplies KIT E11.9    ROS:  Review of Systems  Eyes: Positive for blurred vision (undergoing evaluation with opthalmologist).  Respiratory: Negative for shortness of breath.   Cardiovascular: Negative for chest pain.  Neurological: Negative for dizziness and headaches.     Objective:   Today's Vitals: BP (!) 150/78   Pulse 89   Temp (!) 97.3 F (36.3 C) (Temporal)   Ht 5' 1"  (1.549 m)   Wt 192 lb 9.6 oz (87.4 kg)   SpO2 99%   BMI 36.39 kg/m  Vitals with BMI 07/29/2020 05/06/2020 04/30/2020  Height 5' 1"  5' 1"  -  Weight 192 lbs 10 oz 190 lbs 13 oz -  BMI 50.27 74.12 -  Systolic 878 676 720  Diastolic 78 84 80  Pulse 89 88 82     Physical Exam Vitals reviewed.  Constitutional:      General: She is not in acute distress.    Appearance: Normal appearance.  HENT:     Head: Normocephalic and atraumatic.  Neck:     Vascular:  No carotid bruit.  Cardiovascular:     Rate and Rhythm: Normal rate and regular rhythm.     Pulses: Normal pulses.     Heart sounds: Normal heart sounds.  Pulmonary:     Effort: Pulmonary effort is normal.     Breath sounds: Normal breath sounds.  Skin:    General: Skin is warm and dry.  Neurological:     General: No focal deficit present.     Mental Status: She is alert and oriented to person, place, and time.  Psychiatric:        Mood and Affect: Mood normal.        Behavior: Behavior normal.        Judgment: Judgment normal.          Assessment and Plan   1. Uncontrolled type 2 diabetes mellitus with hyperglycemia ([place])   2. Essential hypertension, benign      Plan: 1.  Per shared decision making we have decided to try Rybelsus.  She denies any personal or family history of thyroid cancer or pancreatitis.  We did discuss some possible side effects and how to take the medication.  I did discuss that this medication is fairly expensive so she has any issues with affording it she needs to call the office and we will have to come up with a different option.  She really would like to avoid insulin if possible.  I will also provide her with a prescription for new glucometer and supplies.  We did again further discuss that risk of hypoglycemia may go up a little bit as were adding an additional diabetic medication.  We discussed how to treat hypoglycemic event and she expressed her understanding. 2.  She will continue on her medications as currently prescribed.  Blood pressure is elevated today, however she just had medication adjustments completed yesterday so will not further titrate up today but will monitor closely.   Tests ordered No orders of the defined types were placed in this encounter.     Meds ordered this encounter  Medications  . Semaglutide (RYBELSUS) 3 MG TABS    Sig: Take 1 tablet by mouth daily.    Dispense:  30 tablet    Refill:  1    Order Specific  Question:   Supervising Provider    Answer:   Hurshel Party C [[id number]]  . blood glucose meter kit and supplies KIT    Sig: Dispense based on patient and insurance preference. Use daily to check your blood sugar.    Dispense:  1  each    Refill:  0    Order Specific Question:   Supervising Provider    Answer:   Doree Albee [[id number]]    Order Specific Question:   Number of strips    Answer:   100    Order Specific Question:   Number of lancets    Answer:   100    Patient to follow-up in 1 month or sooner as needed.  Ailene Ards, NP

## 2020-07-30 DIAGNOSIS — E1165 Type 2 diabetes mellitus with hyperglycemia: Secondary | ICD-10-CM | POA: Diagnosis not present

## 2020-07-31 DIAGNOSIS — E1165 Type 2 diabetes mellitus with hyperglycemia: Secondary | ICD-10-CM | POA: Diagnosis not present

## 2020-08-01 DIAGNOSIS — E1165 Type 2 diabetes mellitus with hyperglycemia: Secondary | ICD-10-CM | POA: Diagnosis not present

## 2020-08-02 ENCOUNTER — Telehealth (INDEPENDENT_AMBULATORY_CARE_PROVIDER_SITE_OTHER): Payer: Self-pay

## 2020-08-02 DIAGNOSIS — E1165 Type 2 diabetes mellitus with hyperglycemia: Secondary | ICD-10-CM | POA: Diagnosis not present

## 2020-08-02 NOTE — Telephone Encounter (Signed)
Received a fax from [facility] with instructions for a recent prescription that was sent:  blood glucose meter kit and supplies KIT  Sent 07/29/2020, 1 with 0 refills  Pharmacy is requesting the following: Plan requires specific directions to process the prescription. Please provide frequency of how patient will be using the medication and days supply limitations.

## 2020-08-03 DIAGNOSIS — E1165 Type 2 diabetes mellitus with hyperglycemia: Secondary | ICD-10-CM | POA: Diagnosis not present

## 2020-08-03 MED ORDER — BLOOD GLUCOSE MONITOR KIT: PACK | 0 refills | Status: DC

## 2020-08-03 NOTE — Telephone Encounter (Signed)
I reordered the glucometer kit and supplies with additional specifications, and I have resent the prescription to [facility]. Thank you.

## 2020-08-04 DIAGNOSIS — E1165 Type 2 diabetes mellitus with hyperglycemia: Secondary | ICD-10-CM | POA: Diagnosis not present

## 2020-08-05 DIAGNOSIS — E1165 Type 2 diabetes mellitus with hyperglycemia: Secondary | ICD-10-CM | POA: Diagnosis not present

## 2020-08-06 DIAGNOSIS — E1165 Type 2 diabetes mellitus with hyperglycemia: Secondary | ICD-10-CM | POA: Diagnosis not present

## 2020-08-07 DIAGNOSIS — E1165 Type 2 diabetes mellitus with hyperglycemia: Secondary | ICD-10-CM | POA: Diagnosis not present

## 2020-08-08 DIAGNOSIS — E1165 Type 2 diabetes mellitus with hyperglycemia: Secondary | ICD-10-CM | POA: Diagnosis not present

## 2020-08-09 DIAGNOSIS — E1165 Type 2 diabetes mellitus with hyperglycemia: Secondary | ICD-10-CM | POA: Diagnosis not present

## 2020-08-10 DIAGNOSIS — E1165 Type 2 diabetes mellitus with hyperglycemia: Secondary | ICD-10-CM | POA: Diagnosis not present

## 2020-08-11 DIAGNOSIS — E1165 Type 2 diabetes mellitus with hyperglycemia: Secondary | ICD-10-CM | POA: Diagnosis not present

## 2020-08-12 DIAGNOSIS — E1165 Type 2 diabetes mellitus with hyperglycemia: Secondary | ICD-10-CM | POA: Diagnosis not present

## 2020-08-13 DIAGNOSIS — E1165 Type 2 diabetes mellitus with hyperglycemia: Secondary | ICD-10-CM | POA: Diagnosis not present

## 2020-08-14 DIAGNOSIS — E1165 Type 2 diabetes mellitus with hyperglycemia: Secondary | ICD-10-CM | POA: Diagnosis not present

## 2020-08-15 DIAGNOSIS — E1165 Type 2 diabetes mellitus with hyperglycemia: Secondary | ICD-10-CM | POA: Diagnosis not present

## 2020-08-16 ENCOUNTER — Other Ambulatory Visit (INDEPENDENT_AMBULATORY_CARE_PROVIDER_SITE_OTHER): Payer: Self-pay | Admitting: Internal Medicine

## 2020-08-16 DIAGNOSIS — E1165 Type 2 diabetes mellitus with hyperglycemia: Secondary | ICD-10-CM | POA: Diagnosis not present

## 2020-08-17 ENCOUNTER — Telehealth (INDEPENDENT_AMBULATORY_CARE_PROVIDER_SITE_OTHER): Payer: Self-pay

## 2020-08-17 DIAGNOSIS — E1165 Type 2 diabetes mellitus with hyperglycemia: Secondary | ICD-10-CM | POA: Diagnosis not present

## 2020-08-17 NOTE — Telephone Encounter (Signed)
Received a fax from [facility] that stated the Rybelsus 3 mg was denied because they did not see certain details about the use and treatment for this patient. I have placed paperwork in your folder. Please advise how you would like to proceed after viewing the information provided on paperwork.

## 2020-08-18 DIAGNOSIS — E1165 Type 2 diabetes mellitus with hyperglycemia: Secondary | ICD-10-CM | POA: Diagnosis not present

## 2020-08-19 ENCOUNTER — Other Ambulatory Visit (INDEPENDENT_AMBULATORY_CARE_PROVIDER_SITE_OTHER): Payer: Self-pay | Admitting: Internal Medicine

## 2020-08-19 DIAGNOSIS — E1165 Type 2 diabetes mellitus with hyperglycemia: Secondary | ICD-10-CM | POA: Diagnosis not present

## 2020-08-20 ENCOUNTER — Ambulatory Visit
Admission: EM | Admit: 2020-08-20 | Discharge: 2020-08-20 | Disposition: A | Discharge status: Home / Self Care | Payer: Medicaid Other | Attending: Emergency Medicine | Admitting: Emergency Medicine

## 2020-08-20 ENCOUNTER — Other Ambulatory Visit: Payer: Self-pay

## 2020-08-20 ENCOUNTER — Emergency Department (HOSPITAL_COMMUNITY)
Admission: EM | Admit: 2020-08-20 | Discharge: 2020-08-20 | Disposition: A | Discharge status: Home / Self Care | Payer: Medicaid Other

## 2020-08-20 VITALS — BP 169/81 | HR 82 | Temp 98.3°F | Resp 19

## 2020-08-20 DIAGNOSIS — K047 Periapical abscess without sinus: Secondary | ICD-10-CM

## 2020-08-20 DIAGNOSIS — E1165 Type 2 diabetes mellitus with hyperglycemia: Secondary | ICD-10-CM | POA: Diagnosis not present

## 2020-08-20 MED ORDER — CEFTRIAXONE SODIUM 1 G IJ SOLR
1.0000 g | Freq: Once | INTRAMUSCULAR | Status: AC
  Administered 2020-08-20: 1 g via INTRAMUSCULAR

## 2020-08-20 NOTE — ED Triage Notes (Signed)
Left shortly after registering

## 2020-08-20 NOTE — ED Provider Notes (Signed)
[facility]   [id number] 08/20/20 Arrival Time: 2863  CC: DENTAL PAIN  SUBJECTIVE:  Ana [name] is a 50 y.o. female who presents with dental pain x few days.  Treated with amoxicillin for dental abscess.  Localizes pain to RT lower gum.  Has tried OTC analgesics without relief.  Worse with chewing.  Does not see a dentist regularly.  Complains of facial swelling.  Denies fever, chills, dysphagia, odynophagia, oral or neck swelling, nausea, vomiting, chest pain, SOB.    ROS: As per HPI.  All other pertinent ROS negative.     Past Medical History:  Diagnosis Date   CKD (chronic kidney disease)    Diabetes mellitus    diet controlled   Hypertension    Past Surgical History:  Procedure Laterality Date   CESAREAN SECTION  x4   CHOLECYSTECTOMY     MASS EXCISION  10/27/2011   Procedure: EXCISION MASS;  Surgeon: Donato Heinz, MD;  Location: [facility];  Service: General;  Laterality: Right;   TUBAL LIGATION     Allergies  Allergen Reactions   Ranitidine Rash   Ranitidine Hcl Rash   No current facility-administered medications on file prior to encounter.   Current Outpatient Medications on File Prior to Encounter  Medication Sig Dispense Refill   ACCU-CHEK GUIDE test strip daily.     Accu-Chek Softclix Lancets lancets daily.     amLODipine (NORVASC) 10 MG tablet TAKE 1 TABLET(10 MG) BY MOUTH DAILY (Patient taking differently: Take 10 mg by mouth daily.) 30 tablet 3   blood glucose meter kit and supplies KIT Dispense based on patient and insurance preference. Use daily to check your blood sugar once per day. 1 each 0   Blood Pressure Monitoring (SPHYGMOMANOMETER) MISC 1 each by Does not apply route daily. 1 each 0   Cholecalciferol (VITAMIN D3) 125 MCG (5000 UT) CAPS Take 1 capsule by mouth daily. (Patient not taking: Reported on 07/29/2020)     Cholecalciferol 25 MCG (1000 UT) capsule Take by mouth.     glipiZIDE (GLUCOTROL) 10 MG tablet TAKE 1 TABLET(10 MG) BY MOUTH  TWICE DAILY BEFORE A MEAL 60 tablet 3   hydrALAZINE (APRESOLINE) 50 MG tablet Take 50 mg by mouth 2 (two) times daily.     HYDROcodone-acetaminophen (NORCO) 5-325 MG tablet Take 1-2 tablets by mouth every 6 (six) hours as needed. (Patient not taking: Reported on 07/29/2020) 10 tablet 0   levothyroxine (SYNTHROID) 50 MCG tablet TAKE 1 TABLET(50 MCG) BY MOUTH DAILY 30 tablet 3   losartan (COZAAR) 100 MG tablet TAKE 1 TABLET(100 MG) BY MOUTH DAILY (Patient taking differently: Take 100 mg by mouth daily.) 90 tablet 1   Semaglutide (RYBELSUS) 3 MG TABS Take 1 tablet by mouth daily. 30 tablet 1   sodium bicarbonate 650 MG tablet Take by mouth.     Social History   Socioeconomic History   Marital status: Single    Spouse name: Not on file   Number of children: Not on file   Years of education: Not on file   Highest education level: Not on file  Occupational History   Not on file  Tobacco Use   Smoking status: Never   Smokeless tobacco: Never  Vaping Use   Vaping Use: Never used  Substance and Sexual Activity   Alcohol use: Yes    Comment: occ   Drug use: No   Sexual activity: Yes    Birth control/protection: Surgical  Other Topics Concern  Not on file  Social History Narrative   Divorced,married for 8 years.Lives with daughter and 3 grandkids.Unemployed.   Social Determinants of Health   Financial Resource Strain: Not on file  Food Insecurity: Not on file  Transportation Needs: Not on file  Physical Activity: Not on file  Stress: Not on file  Social Connections: Not on file  Intimate Partner Violence: Not on file   Family History  Problem Relation Age of Onset   Kidney failure Mother    Diabetes Mother    Kidney disease Mother    Kidney failure Father    Kidney disease Father    Kidney failure Brother    Diabetes Brother    Diabetes Brother     OBJECTIVE:  Vitals:   08/20/20 1647  BP: (!) 169/81  Pulse: 82  Resp: 19  Temp: 98.3 F (36.8 C)  TempSrc: Oral   SpO2: 98%    General appearance: alert; no distress HENT: normocephalic; atraumatic; LT sided facial abscess; dentition: poor; abscess present- right lower over right lower gums without areas of fluctuance Neck: supple without LAD Lungs: normal respirations Skin: warm and dry Psychological: alert and cooperative; normal mood and affect  ASSESSMENT & PLAN:  1. Dental abscess     Meds ordered this encounter  Medications   cefTRIAXone (ROCEPHIN) injection 1 g    Continue with amoxicillin Rocephin injection in office Recommend soft diet until evaluated by dentist Maintain oral hygiene care Follow up with dentist as soon as possible for further evaluation and treatment  Return or go to the ED if you have any new or worsening symptoms such as fever, chills, difficulty swallowing, painful swallowing, oral or neck swelling, nausea, vomiting, chest pain, SOB, etc...  Reviewed expectations re: course of current medical issues. Questions answered. Outlined signs and symptoms indicating need for more acute intervention. Patient verbalized understanding. After Visit Summary given.    Lestine Box, PA-C 08/20/20 1706

## 2020-08-20 NOTE — ED Triage Notes (Signed)
Pt has dental abscess. Is currently taking abx prescribed by dentist yesterday.  States abscess has gotten worse and moved down her neck.

## 2020-08-20 NOTE — Discharge Instructions (Signed)
Continue with amoxicillin Rocephin injection in office Recommend soft diet until evaluated by dentist Maintain oral hygiene care Follow up with dentist as soon as possible for further evaluation and treatment  Return or go to the ED if you have any new or worsening symptoms such as fever, chills, difficulty swallowing, painful swallowing, oral or neck swelling, nausea, vomiting, chest pain, SOB, etc..Marland Kitchen

## 2020-08-21 DIAGNOSIS — E1165 Type 2 diabetes mellitus with hyperglycemia: Secondary | ICD-10-CM | POA: Diagnosis not present

## 2020-08-22 DIAGNOSIS — E1165 Type 2 diabetes mellitus with hyperglycemia: Secondary | ICD-10-CM | POA: Diagnosis not present

## 2020-08-23 DIAGNOSIS — E1165 Type 2 diabetes mellitus with hyperglycemia: Secondary | ICD-10-CM | POA: Diagnosis not present

## 2020-08-24 ENCOUNTER — Telehealth (INDEPENDENT_AMBULATORY_CARE_PROVIDER_SITE_OTHER): Payer: Self-pay

## 2020-08-24 DIAGNOSIS — E1165 Type 2 diabetes mellitus with hyperglycemia: Secondary | ICD-10-CM | POA: Diagnosis not present

## 2020-08-24 NOTE — Telephone Encounter (Signed)
Ana [name] I faxed over records today . Last 3 OV , labs.

## 2020-08-25 ENCOUNTER — Other Ambulatory Visit (INDEPENDENT_AMBULATORY_CARE_PROVIDER_SITE_OTHER): Payer: Self-pay | Admitting: Internal Medicine

## 2020-08-25 DIAGNOSIS — E1165 Type 2 diabetes mellitus with hyperglycemia: Secondary | ICD-10-CM | POA: Diagnosis not present

## 2020-08-25 NOTE — Telephone Encounter (Signed)
I called the insurance company today to verify that all new documentation has been received.  They tell me that the documentation was not received by their deadlines to the peer to peer case has been closed.  They tell me we are unable to reopen the peer to peer case and if we would like to move forward we have to open an appeal.  The number to call to do this is [phone]

## 2020-08-25 NOTE — Telephone Encounter (Signed)
No just a fax for the records.

## 2020-08-26 DIAGNOSIS — E1165 Type 2 diabetes mellitus with hyperglycemia: Secondary | ICD-10-CM | POA: Diagnosis not present

## 2020-08-27 DIAGNOSIS — E1165 Type 2 diabetes mellitus with hyperglycemia: Secondary | ICD-10-CM | POA: Diagnosis not present

## 2020-08-28 DIAGNOSIS — E1165 Type 2 diabetes mellitus with hyperglycemia: Secondary | ICD-10-CM | POA: Diagnosis not present

## 2020-08-29 DIAGNOSIS — E1165 Type 2 diabetes mellitus with hyperglycemia: Secondary | ICD-10-CM | POA: Diagnosis not present

## 2020-08-30 DIAGNOSIS — E1165 Type 2 diabetes mellitus with hyperglycemia: Secondary | ICD-10-CM | POA: Diagnosis not present

## 2020-08-31 DIAGNOSIS — E1165 Type 2 diabetes mellitus with hyperglycemia: Secondary | ICD-10-CM | POA: Diagnosis not present

## 2020-09-01 DIAGNOSIS — E1165 Type 2 diabetes mellitus with hyperglycemia: Secondary | ICD-10-CM | POA: Diagnosis not present

## 2020-09-02 ENCOUNTER — Other Ambulatory Visit: Payer: Self-pay

## 2020-09-02 ENCOUNTER — Ambulatory Visit (HOSPITAL_COMMUNITY)
Admission: RE | Admit: 2020-09-02 | Discharge: 2020-09-02 | Disposition: A | Discharge status: Home / Self Care | Payer: Medicaid Other | Source: Ambulatory Visit | Attending: Nurse Practitioner | Admitting: Nurse Practitioner

## 2020-09-02 ENCOUNTER — Telehealth (INDEPENDENT_AMBULATORY_CARE_PROVIDER_SITE_OTHER): Payer: Self-pay | Admitting: Nurse Practitioner

## 2020-09-02 ENCOUNTER — Encounter (INDEPENDENT_AMBULATORY_CARE_PROVIDER_SITE_OTHER): Payer: Self-pay | Admitting: Nurse Practitioner

## 2020-09-02 ENCOUNTER — Ambulatory Visit (INDEPENDENT_AMBULATORY_CARE_PROVIDER_SITE_OTHER): Payer: Medicaid Other | Admitting: Nurse Practitioner

## 2020-09-02 VITALS — BP 163/78 | HR 93 | Temp 97.3°F | Ht 61.0 in | Wt 207.6 lb

## 2020-09-02 DIAGNOSIS — I10 Essential (primary) hypertension: Secondary | ICD-10-CM

## 2020-09-02 DIAGNOSIS — E039 Hypothyroidism, unspecified: Secondary | ICD-10-CM | POA: Diagnosis not present

## 2020-09-02 DIAGNOSIS — M7989 Other specified soft tissue disorders: Secondary | ICD-10-CM

## 2020-09-02 DIAGNOSIS — I517 Cardiomegaly: Secondary | ICD-10-CM | POA: Diagnosis not present

## 2020-09-02 DIAGNOSIS — R0789 Other chest pain: Secondary | ICD-10-CM | POA: Diagnosis not present

## 2020-09-02 DIAGNOSIS — J90 Pleural effusion, not elsewhere classified: Secondary | ICD-10-CM | POA: Diagnosis not present

## 2020-09-02 DIAGNOSIS — R0601 Orthopnea: Secondary | ICD-10-CM

## 2020-09-02 DIAGNOSIS — E1165 Type 2 diabetes mellitus with hyperglycemia: Secondary | ICD-10-CM | POA: Diagnosis not present

## 2020-09-02 DIAGNOSIS — E559 Vitamin D deficiency, unspecified: Secondary | ICD-10-CM | POA: Diagnosis not present

## 2020-09-02 DIAGNOSIS — J811 Chronic pulmonary edema: Secondary | ICD-10-CM | POA: Diagnosis not present

## 2020-09-02 MED ORDER — FUROSEMIDE 20 MG PO TABS
20.0000 mg | ORAL_TABLET | Freq: Every day | ORAL | 3 refills | Status: DC | PRN
Start: 2020-09-02 — End: 2020-09-21

## 2020-09-02 MED ORDER — CARVEDILOL 3.125 MG PO TABS: 3.1250 mg | ORAL_TABLET | Freq: Two times a day (BID) | ORAL | 3 refills | Status: DC

## 2020-09-02 NOTE — Telephone Encounter (Signed)
Nellie, I know he saw the referrals.  I have also ordered a cardiac echocardiogram for further evaluation of her symptoms.  Please make sure this is scheduled when you are able to.  Thank you.
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
## 2020-09-02 NOTE — Telephone Encounter (Signed)
This patient came in today for office visit for diabetes.  However she had mentioned for the last 2 weeks has been experiencing leg swelling and orthopnea.  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  I ordered EKG in the office today and the interpretation is showing me as atrial flutter, however when I view it I do not see atrial flutter to me it looks like its sinus.  I was wondering if you could also look at the EKG and tell me if you feel it is in atrial flutter if you agree that at the sinus rhythm.  My plan moving forward is to get some blood work today including A1c, TSH, and CMP.  I am also can order cardiac echocardiogram and referral to endocrinology for uncontrolled type 2 diabetes and cardiology for her symptoms that she is complaining about today.  Blood pressure is also elevated today so I was going to add Coreg 3.125 by mouth twice a day.  Let me know if you have any other thoughts or suggestions.  Patient already sees nephrology for treatment of her CKD.  Thank you.

## 2020-09-03 DIAGNOSIS — E1165 Type 2 diabetes mellitus with hyperglycemia: Secondary | ICD-10-CM | POA: Diagnosis not present

## 2020-09-03 LAB — COMPLETE METABOLIC PANEL WITH GFR
AG Ratio: 0.9 (calc) — ABNORMAL LOW (ref 1.0–2.5)
ALT: 10 U/L (ref 6–29)
AST: 14 U/L (ref 10–35)
Albumin: 3.5 g/dL — ABNORMAL LOW (ref 3.6–5.1)
Alkaline phosphatase (APISO): 156 U/L — ABNORMAL HIGH (ref 37–153)
BUN/Creatinine Ratio: 15 (calc) (ref 6–22)
BUN: 31 mg/dL — ABNORMAL HIGH (ref 7–25)
CO2: 22 mmol/L (ref 20–32)
Calcium: 9.4 mg/dL (ref 8.6–10.4)
Chloride: 105 mmol/L (ref 98–110)
Creat: 2.1 mg/dL — ABNORMAL HIGH (ref 0.50–1.05)
GFR, Est African American: 31 mL/min/{1.73_m2} — ABNORMAL LOW (ref >=60)
GFR, Est Non African American: 27 mL/min/{1.73_m2} — ABNORMAL LOW (ref >=60)
Globulin: 3.7 g/dL (calc) (ref 1.9–3.7)
Glucose, Bld: 200 mg/dL — ABNORMAL HIGH (ref 65–99)
Potassium: 4.5 mmol/L (ref 3.5–5.3)
Sodium: 138 mmol/L (ref 135–146)
Total Bilirubin: 0.4 mg/dL (ref 0.2–1.2)
Total Protein: 7.2 g/dL (ref 6.1–8.1)

## 2020-09-03 LAB — HEMOGLOBIN A1C
Hgb A1c MFr Bld: 10.5 % of total Hgb — ABNORMAL HIGH (ref <5.7)
Mean Plasma Glucose: 255 mg/dL
eAG (mmol/L): 14.1 mmol/L

## 2020-09-03 LAB — TSH: TSH: 3.24 mIU/L

## 2020-09-03 LAB — VITAMIN D 25 HYDROXY (VIT D DEFICIENCY, FRACTURES): Vit D, 25-Hydroxy: 26 ng/mL — ABNORMAL LOW (ref 30–100)

## 2020-09-04 DIAGNOSIS — E1165 Type 2 diabetes mellitus with hyperglycemia: Secondary | ICD-10-CM | POA: Diagnosis not present

## 2020-09-05 DIAGNOSIS — E1165 Type 2 diabetes mellitus with hyperglycemia: Secondary | ICD-10-CM | POA: Diagnosis not present

## 2020-09-06 ENCOUNTER — Telehealth (INDEPENDENT_AMBULATORY_CARE_PROVIDER_SITE_OTHER): Payer: Self-pay | Admitting: Nurse Practitioner

## 2020-09-06 DIAGNOSIS — E1165 Type 2 diabetes mellitus with hyperglycemia: Secondary | ICD-10-CM | POA: Diagnosis not present

## 2020-09-06 NOTE — Telephone Encounter (Signed)
I saw your note regarding calling her with the x-ray results.  I did send you a reply but I cannot find the reply in her chart.  So I am resending it this way so that will be documented.  Please make sure she is only 1 tablet by mouth as needed every day of her furosemide for swelling.  She should not be taking 2 tablets by mouth at this time.  If her symptoms worsen between now and her next appointment she needs to call our office, however if she feels unwell outside of business hours then she needs to go to an urgent care.  I do not want her to take too much furosemide until she can be monitored for follow-up due to concerns of possible worsening kidney function and/or hypokalemia.  Please let me know if you or her have any questions.

## 2020-09-06 NOTE — Progress Notes (Signed)
Pt was called and given CT results of the testing showing fluid around her lungs. Sh stated she feels much better today. She has been taking the lasix tablet  2x/day since last office visit. Has been going to void more then before , she felt like like she needed it also. Pt will keep same appt to come back to see you in 7/109/22 @ 2:40cpm.

## 2020-09-06 NOTE — Telephone Encounter (Signed)
I am not sure why it did not save. So , Called Ana [name] earlier this morning after 9 AM. Pt was given results of CT scan, understood. But for the Lasix she is taking 2 tablets a day only; as you prescribed. Feels a lot better since the last visit. Also her echo is scheduled for next week. I contacted them today. Also please look in the in-basket to see if it is there by chance. Sorry for the delay of note.

## 2020-09-07 DIAGNOSIS — E1165 Type 2 diabetes mellitus with hyperglycemia: Secondary | ICD-10-CM | POA: Diagnosis not present

## 2020-09-07 NOTE — Telephone Encounter (Signed)
Called Pt ans she said her family was given her fluid pill and she takes medication as given to her daily. She is only taking 1 pill twice a day. She is also aware of echo, the lab called her to notify off appt.

## 2020-09-07 NOTE — Telephone Encounter (Signed)
Pt was called and given your verbal orders. She agreed to not taking but 1 tablet a day.

## 2020-09-08 DIAGNOSIS — E1165 Type 2 diabetes mellitus with hyperglycemia: Secondary | ICD-10-CM | POA: Diagnosis not present

## 2020-09-09 ENCOUNTER — Other Ambulatory Visit (INDEPENDENT_AMBULATORY_CARE_PROVIDER_SITE_OTHER): Payer: Self-pay | Admitting: Internal Medicine

## 2020-09-09 DIAGNOSIS — E1165 Type 2 diabetes mellitus with hyperglycemia: Secondary | ICD-10-CM | POA: Diagnosis not present

## 2020-09-10 DIAGNOSIS — E1165 Type 2 diabetes mellitus with hyperglycemia: Secondary | ICD-10-CM | POA: Diagnosis not present

## 2020-09-11 DIAGNOSIS — E1165 Type 2 diabetes mellitus with hyperglycemia: Secondary | ICD-10-CM | POA: Diagnosis not present

## 2020-09-12 DIAGNOSIS — E1165 Type 2 diabetes mellitus with hyperglycemia: Secondary | ICD-10-CM | POA: Diagnosis not present

## 2020-09-13 ENCOUNTER — Other Ambulatory Visit: Payer: Self-pay

## 2020-09-13 ENCOUNTER — Ambulatory Visit: Payer: Medicaid Other | Admitting: Internal Medicine

## 2020-09-13 ENCOUNTER — Encounter: Payer: Self-pay | Admitting: Internal Medicine

## 2020-09-13 VITALS — BP 160/80 | HR 92 | Ht 60.0 in | Wt 209.0 lb

## 2020-09-13 DIAGNOSIS — R0789 Other chest pain: Secondary | ICD-10-CM | POA: Diagnosis not present

## 2020-09-13 DIAGNOSIS — E1165 Type 2 diabetes mellitus with hyperglycemia: Secondary | ICD-10-CM | POA: Diagnosis not present

## 2020-09-13 NOTE — Progress Notes (Signed)
Cardiology Office Note   Date:  09/13/2020   ID:  Ana [name], DOB [birth year], MRN [id number]  PCP:  Doree Albee, MD  Cardiologist:   Dorris Carnes, MD   Pt referred for edema, SOB   History of Present Illness: Ana [name] is a 50 y.o. female with no known cardiac problems   She is followed by Dr Anastasio Champion   She is also being seen by nephrology (Dr Theador Hawthorne)  She was seen on July 28 2020  At that visit BP was elevated   Recomm hydralazine increasd.  Autoimmune work up neg  Hep C Ab is +   Recomm Na bicarb   She was seen by [name] on 09/02/20   Noted leg swelling and also pressure in chest and SOB when laying down   Also noted chest heaviness with activity   When she would stop it got better    Note that her BP ws high at that visit     Current Meds  Medication Sig   ACCU-CHEK GUIDE test strip daily.   Accu-Chek Softclix Lancets lancets daily.   amLODipine (NORVASC) 10 MG tablet TAKE 1 TABLET(10 MG) BY MOUTH DAILY   blood glucose meter kit and supplies KIT Dispense based on patient and insurance preference. Use daily to check your blood sugar once per day.   Blood Pressure Monitoring (SPHYGMOMANOMETER) MISC 1 each by Does not apply route daily.   carvedilol (COREG) 3.125 MG tablet Take 1 tablet (3.125 mg total) by mouth 2 (two) times daily with a meal.   Cholecalciferol 25 MCG (1000 UT) capsule Take by mouth.   furosemide (LASIX) 20 MG tablet Take 1 tablet (20 mg total) by mouth daily as needed for edema.   glipiZIDE (GLUCOTROL) 10 MG tablet TAKE 1 TABLET(10 MG) BY MOUTH TWICE DAILY BEFORE A MEAL   hydrALAZINE (APRESOLINE) 50 MG tablet Take 50 mg by mouth 2 (two) times daily.   levothyroxine (SYNTHROID) 50 MCG tablet TAKE 1 TABLET(50 MCG) BY MOUTH DAILY   losartan (COZAAR) 100 MG tablet TAKE 1 TABLET(100 MG) BY MOUTH DAILY (Patient taking differently: Take 100 mg by mouth daily.)   sodium bicarbonate 650 MG tablet Take 650 mg by mouth 4 (four) times daily.     Allergies:    Ranitidine and Ranitidine hcl   Past Medical History:  Diagnosis Date   CKD (chronic kidney disease)    Diabetes mellitus    diet controlled   Hypertension     Past Surgical History:  Procedure Laterality Date   CESAREAN SECTION  x4   CHOLECYSTECTOMY     MASS EXCISION  10/27/2011   Procedure: EXCISION MASS;  Surgeon: Donato Heinz, MD;  Location: [facility];  Service: General;  Laterality: Right;   TUBAL LIGATION       Social History:  The patient  reports that she has never smoked. She has never used smokeless tobacco. She reports current alcohol use. She reports that she does not use drugs.   Family History:  The patient's family history includes Diabetes in her brother, brother, and mother; Kidney disease in her father and mother; Kidney failure in her brother, father, and mother.    ROS:  Please see the history of present illness. All other systems are reviewed and  Negative to the above problem except as noted.    PHYSICAL EXAM: VS:  BP (!) 160/80   Pulse 92   Ht 5' (1.524 m)   Wt 209 lb (  94.8 kg)   SpO2 96%   BMI 40.82 kg/m   GEN: Morbidly obese 50 yo  in no acute distress  HEENT: normal  Neck: no JVD, carotid bruits Cardiac: RRR; no murmurs,,1-2+ LE edema  Respiratory:  clear to auscultation bilaterally GI: soft, nontender, nondistended, + BS  No hepatomegaly  MS: no deformity Moving all extremities   Skin: warm and dry, no rash Neuro:  Strength and sensation are intact Psych: euthymic mood, full affect   EKG:  EKG is not ordered today.  On 09/02/20 SR present   (read out as atrial flutter mistakenly   Lipid Panel    Component Value Date/Time   CHOL 233 (H) 03/18/2020 0000   TRIG 69 03/18/2020 0000   HDL 63 03/18/2020 0000   CHOLHDL 3.7 03/18/2020 0000   LDLCALC 154 (H) 03/18/2020 0000      Wt Readings from Last 3 Encounters:  09/13/20 209 lb (94.8 kg)  09/02/20 207 lb 9.6 oz (94.2 kg)  07/29/20 192 lb 9.6 oz (87.4 kg)      ASSESSMENT AND  PLAN:  1  Edema/ chest pressure   Pt with increased volume on exam   Timing of onset seems to coincide with initiation of sodium bicarbonate supplements        Will review labs and note from renal clinic    Pt is already set up for echo in 2 days   REview  2   HTN   BP is not controlled   Once labs back would consider increasing diuresis.      Stressed taking hydralazine tidd  3  CKD  Review records from Dr Toya Smothers office    Follow up based on test results

## 2020-09-13 NOTE — Patient Instructions (Signed)
Medication Instructions:  Your physician recommends that you continue on your current medications as directed. Please refer to the Current Medication list given to you today.  *If you need a refill on your cardiac medications before your next appointment, please call your pharmacy*   Lab Work: None If you have labs (blood work) drawn today and your tests are completely normal, you will receive your results only by: [place] (if you have MyChart) OR A paper copy in the mail If you have any lab test that is abnormal or we need to change your treatment, we will call you to review the results.   Testing/Procedures: None   Follow-Up: At [facility], you and your health needs are our priority.  As part of our continuing mission to provide you with exceptional heart care, we have created designated Provider Care Teams.  These Care Teams include your primary Cardiologist (physician) and Advanced Practice Providers (APPs -  Physician Assistants and Nurse Practitioners) who all work together to provide you with the care you need, when you need it.  We recommend signing up for the patient portal called "MyChart".  Sign up information is provided on this After Visit Summary.  MyChart is used to connect with patients for Virtual Visits (Telemedicine).  Patients are able to view lab/test results, encounter notes, upcoming appointments, etc.  Non-urgent messages can be sent to your provider as well.   To learn more about what you can do with MyChart, go to [email or web address].    Your next appointment:   F/U: Pening   Other Instructions

## 2020-09-14 ENCOUNTER — Ambulatory Visit (INDEPENDENT_AMBULATORY_CARE_PROVIDER_SITE_OTHER): Payer: Medicaid Other | Admitting: Nurse Practitioner

## 2020-09-14 ENCOUNTER — Encounter (INDEPENDENT_AMBULATORY_CARE_PROVIDER_SITE_OTHER): Payer: Self-pay | Admitting: Nurse Practitioner

## 2020-09-14 DIAGNOSIS — E1165 Type 2 diabetes mellitus with hyperglycemia: Secondary | ICD-10-CM | POA: Diagnosis not present

## 2020-09-15 DIAGNOSIS — E1165 Type 2 diabetes mellitus with hyperglycemia: Secondary | ICD-10-CM | POA: Diagnosis not present

## 2020-09-16 ENCOUNTER — Other Ambulatory Visit: Payer: Self-pay

## 2020-09-16 ENCOUNTER — Telehealth (INDEPENDENT_AMBULATORY_CARE_PROVIDER_SITE_OTHER): Payer: Self-pay | Admitting: Nurse Practitioner

## 2020-09-16 ENCOUNTER — Ambulatory Visit (HOSPITAL_COMMUNITY)
Admission: RE | Admit: 2020-09-16 | Discharge: 2020-09-16 | Disposition: A | Discharge status: Home / Self Care | Payer: Medicaid Other | Source: Ambulatory Visit | Attending: Nurse Practitioner | Admitting: Nurse Practitioner

## 2020-09-16 DIAGNOSIS — R0789 Other chest pain: Secondary | ICD-10-CM | POA: Diagnosis not present

## 2020-09-16 DIAGNOSIS — E1165 Type 2 diabetes mellitus with hyperglycemia: Secondary | ICD-10-CM | POA: Diagnosis not present

## 2020-09-16 DIAGNOSIS — E559 Vitamin D deficiency, unspecified: Secondary | ICD-10-CM | POA: Diagnosis not present

## 2020-09-16 DIAGNOSIS — E039 Hypothyroidism, unspecified: Secondary | ICD-10-CM | POA: Diagnosis not present

## 2020-09-16 DIAGNOSIS — I10 Essential (primary) hypertension: Secondary | ICD-10-CM | POA: Insufficient documentation

## 2020-09-16 LAB — ECHOCARDIOGRAM COMPLETE
Area-P 1/2: 4.6 cm2
S' Lateral: 2.5 cm

## 2020-09-16 NOTE — Progress Notes (Signed)
*  PRELIMINARY RESULTS* Echocardiogram 2D Echocardiogram has been performed. Patient could not complete echo exam due to having an anxiety attack at the end of exam. Patient declined to go to the ED to be examined for her shortness of breath and anxiety.  Samuel Germany 09/16/2020, 12:37 PM

## 2020-09-16 NOTE — Telephone Encounter (Signed)
Please call this patient and schedule her for a follow-up with me or Dr. Darnell Level as she missed her last scheduled follow-up. If she can not be seen next week due to scheduling issues then get her scheduled for a lab visit early next week and then next soonest available appointment with either myself or Dr. Darnell Level. I have placed the order for the labs. Thank you.

## 2020-09-16 NOTE — Telephone Encounter (Signed)
Called patient and nothing was available and have scheduled patient to have lab on Monday. Patient verbalized an understanding.

## 2020-09-17 DIAGNOSIS — E1165 Type 2 diabetes mellitus with hyperglycemia: Secondary | ICD-10-CM | POA: Diagnosis not present

## 2020-09-18 ENCOUNTER — Telehealth (INDEPENDENT_AMBULATORY_CARE_PROVIDER_SITE_OTHER): Payer: Self-pay | Admitting: Nurse Practitioner

## 2020-09-18 DIAGNOSIS — E1165 Type 2 diabetes mellitus with hyperglycemia: Secondary | ICD-10-CM | POA: Diagnosis not present

## 2020-09-18 NOTE — Telephone Encounter (Signed)
I called this patient this morning to notify her of her cardiac echocardiogram results.  She tells me that her symptoms are stable and she is still experiencing some shortness of breath, orthopnea, and leg swelling.  She was started on 20 mg of furosemide daily as needed for swelling at her last office visit with me.  Unfortunately she did miss her follow-up so have not yet been able to check her CMP since starting this medication.  She is aware that she is scheduled for lab appointment this upcoming Monday so we can check her CMP.  She tells me that her symptoms are not any worse than they were before but they are also not much better.  I have also reached out to the patient's cardiologist for assistance with next steps in treating this patient.  Awaiting her response. I did tell the patient and per the patient's request the patient's daughter, that if the patient's symptoms worsen at all over the weekend she needs to go to the emergency department but otherwise I would be in touch with her regarding neck steps for treatment.  She tells me she understands.

## 2020-09-19 DIAGNOSIS — E1165 Type 2 diabetes mellitus with hyperglycemia: Secondary | ICD-10-CM | POA: Diagnosis not present

## 2020-09-20 ENCOUNTER — Other Ambulatory Visit (INDEPENDENT_AMBULATORY_CARE_PROVIDER_SITE_OTHER): Payer: Medicaid Other

## 2020-09-20 ENCOUNTER — Other Ambulatory Visit: Payer: Self-pay

## 2020-09-20 DIAGNOSIS — I10 Essential (primary) hypertension: Secondary | ICD-10-CM | POA: Diagnosis not present

## 2020-09-20 DIAGNOSIS — E1165 Type 2 diabetes mellitus with hyperglycemia: Secondary | ICD-10-CM | POA: Diagnosis not present

## 2020-09-21 ENCOUNTER — Other Ambulatory Visit (INDEPENDENT_AMBULATORY_CARE_PROVIDER_SITE_OTHER): Payer: Self-pay | Admitting: Nurse Practitioner

## 2020-09-21 DIAGNOSIS — E1165 Type 2 diabetes mellitus with hyperglycemia: Secondary | ICD-10-CM | POA: Diagnosis not present

## 2020-09-21 DIAGNOSIS — I3139 Other pericardial effusion (noninflammatory): Secondary | ICD-10-CM

## 2020-09-21 DIAGNOSIS — I313 Pericardial effusion (noninflammatory): Secondary | ICD-10-CM

## 2020-09-21 DIAGNOSIS — M7989 Other specified soft tissue disorders: Secondary | ICD-10-CM

## 2020-09-21 MED ORDER — FUROSEMIDE 40 MG PO TABS: 40.0000 mg | ORAL_TABLET | Freq: Every day | ORAL | 0 refills | Status: DC

## 2020-09-22 DIAGNOSIS — E1165 Type 2 diabetes mellitus with hyperglycemia: Secondary | ICD-10-CM | POA: Diagnosis not present

## 2020-09-23 ENCOUNTER — Ambulatory Visit: Payer: Medicaid Other | Admitting: Internal Medicine

## 2020-09-23 DIAGNOSIS — E1165 Type 2 diabetes mellitus with hyperglycemia: Secondary | ICD-10-CM | POA: Diagnosis not present

## 2020-09-23 NOTE — Progress Notes (Deleted)
Name: Ana [name]  MRN/ DOB: [id number], 1970-04-08   Age/ Sex: 50 y.o., female    PCP: Doree Albee, MD   Reason for Endocrinology Evaluation: Type 2 Diabetes Mellitus     Date of Initial Endocrinology Visit: 09/23/2020     PATIENT IDENTIFIER: Ms. Ana [name] is a 50 y.o. female with a past medical history of T2DM , Hypothyroidism, HTN and CKD III. The patient presented for initial endocrinology clinic visit on 09/23/2020 for consultative assistance with her diabetes management.    HPI: [name] was    Diagnosed with DM *** Prior Medications tried/Intolerance: *** Currently checking blood sugars *** x / day,  before breakfast and ***.  Hypoglycemia episodes : ***               Symptoms: ***                 Frequency: ***/  Hemoglobin A1c has ranged from 10.5% in 2022, peaking at 12.8% in 2019. Patient required assistance for hypoglycemia:  Patient has required hospitalization within the last 1 year from hyper or hypoglycemia:   In terms of diet, the patient ***   HOME DIABETES REGIMEN: Glipizide 10 mg daily    Statin: no ACE-I/ARB: yes Prior Diabetic Education: {Yes/No:11203}   METER DOWNLOAD SUMMARY: Date range evaluated: *** Fingerstick Blood Glucose Tests = *** Average Number Tests/Day = *** Overall Mean FS Glucose = *** Standard Deviation = ***  BG Ranges: Low = *** High = ***   Hypoglycemic Events/30 Days: BG < 50 = *** Episodes of symptomatic severe hypoglycemia = ***   DIABETIC COMPLICATIONS: Microvascular complications:  CKD III Denies: *** Last eye exam: Completed   Macrovascular complications:   Denies: CAD, PVD, CVA   PAST HISTORY: Past Medical History:  Past Medical History:  Diagnosis Date   CKD (chronic kidney disease)    Diabetes mellitus    diet controlled   Hypertension    Past Surgical History:  Past Surgical History:  Procedure Laterality Date   CESAREAN SECTION  x4   CHOLECYSTECTOMY     MASS EXCISION   10/27/2011   Procedure: EXCISION MASS;  Surgeon: Donato Heinz, MD;  Location: [facility];  Service: General;  Laterality: Right;   TUBAL LIGATION      Social History:  reports that she has never smoked. She has never used smokeless tobacco. She reports current alcohol use. She reports that she does not use drugs. Family History:  Family History  Problem Relation Age of Onset   Kidney failure Mother    Diabetes Mother    Kidney disease Mother    Kidney failure Father    Kidney disease Father    Kidney failure Brother    Diabetes Brother    Diabetes Brother      HOME MEDICATIONS: Allergies as of 09/23/2020       Reactions   Ranitidine Rash   Ranitidine Hcl Rash        Medication List        Accurate as of September 23, 2020  7:25 AM. If you have any questions, ask your nurse or doctor.          Accu-Chek Guide test strip Generic drug: glucose blood daily.   Accu-Chek Softclix Lancets lancets daily.   amLODipine 10 MG tablet Commonly known as: NORVASC TAKE 1 TABLET(10 MG) BY MOUTH DAILY   blood glucose meter kit and supplies Kit Dispense based on patient and insurance preference.  Use daily to check your blood sugar once per day.   carvedilol 3.125 MG tablet Commonly known as: COREG Take 1 tablet (3.125 mg total) by mouth 2 (two) times daily with a meal.   Cholecalciferol 25 MCG (1000 UT) capsule Take by mouth.   furosemide 40 MG tablet Commonly known as: LASIX Take 1 tablet (40 mg total) by mouth daily.   glipiZIDE 10 MG tablet Commonly known as: GLUCOTROL TAKE 1 TABLET(10 MG) BY MOUTH TWICE DAILY BEFORE A MEAL   HYDROcodone-acetaminophen 5-325 MG tablet Commonly known as: Norco Take 1-2 tablets by mouth every 6 (six) hours as needed.   levothyroxine 50 MCG tablet Commonly known as: SYNTHROID TAKE 1 TABLET(50 MCG) BY MOUTH DAILY   losartan 100 MG tablet Commonly known as: COZAAR TAKE 1 TABLET(100 MG) BY MOUTH DAILY What changed: See the new  instructions.   sodium bicarbonate 650 MG tablet Take 650 mg by mouth 4 (four) times daily.   Sphygmomanometer Misc 1 each by Does not apply route daily.         ALLERGIES: Allergies  Allergen Reactions   Ranitidine Rash   Ranitidine Hcl Rash     REVIEW OF SYSTEMS: A comprehensive ROS was conducted with the patient and is negative except as per HPI and below:  ROS    OBJECTIVE:   VITAL SIGNS: There were no vitals taken for this visit.   PHYSICAL EXAM:  General: Pt appears well and is in NAD  Hydration: Well-hydrated with moist mucous membranes and good skin turgor  HEENT: Head: Unremarkable with good dentition. Oropharynx clear without exudate.  Eyes: External eye exam normal without stare, lid lag or exophthalmos.  EOM intact.  PERRL.  Neck: General: Supple without adenopathy or carotid bruits. Thyroid: Thyroid size normal.  No goiter or nodules appreciated. No thyroid bruit.  Lungs: Clear with good BS bilat with no rales, rhonchi, or wheezes  Heart: RRR with normal S1 and S2 and no gallops; no murmurs; no rub  Abdomen: Normoactive bowel sounds, soft, nontender, without masses or organomegaly palpable  Extremities:  Lower extremities - No pretibial edema. No lesions.  Skin: Normal texture and temperature to palpation. No rash noted. No Acanthosis nigricans/skin tags. No lipohypertrophy.  Neuro: MS is good with appropriate affect, pt is alert and Ox3    DM foot exam:    DATA REVIEWED:  Lab Results  Component Value Date   HGBA1C 10.5 (H) 09/02/2020   HGBA1C 11.2 (H) 03/18/2020   HGBA1C 12.6 (H) 09/17/2019   Lab Results  Component Value Date   LDLCALC 154 (H) 03/18/2020   CREATININE 2.18 (H) 09/20/2020   No results found for: [facility]  Lab Results  Component Value Date   CHOL 233 (H) 03/18/2020   HDL 63 03/18/2020   LDLCALC 154 (H) 03/18/2020   TRIG 69 03/18/2020   CHOLHDL 3.7 03/18/2020        ASSESSMENT / PLAN / RECOMMENDATIONS:   1) Type  2 Diabetes Mellitus, ***controlled, With CKD III complications - Most recent A1c of *** %. Goal A1c < 7.0 %.    Plan: GENERAL: ***  MEDICATIONS: ***  EDUCATION / INSTRUCTIONS: BG monitoring instructions: Patient is instructed to check her blood sugars *** times a day, ***. Call [facility] if: BG persistently < 70  I reviewed the Rule of 15 for the treatment of hypoglycemia in detail with the patient. Literature supplied.   2) Diabetic complications:  Eye: Does *** have known diabetic retinopathy.  Neuro/ Feet: Does *** have known diabetic peripheral neuropathy. Renal: Patient does  have known baseline CKD. She is  on an ACEI/ARB at present.Check urine albumin/creatinine ratio yearly starting at time of diagnosis.   3) Lipids: Patient is *** on a statin.          Signed electronically by: Mack Guise, MD  [facility]  [facility] [address] [place], [place] [address] Phone: [phone] FAX: [phone]   CC: Doree Albee, MD [place] [address] Phone: [phone]  Fax: [phone]    Return to Endocrinology clinic as below: Future Appointments  Date Time Provider [place]  09/23/2020 11:30 AM Casady Voshell, Melanie Crazier, MD [facility] None  09/30/2020 11:30 AM Ailene Ards, NP [place] None  10/06/2020  9:20 AM Fay Records, MD [facility]  12/06/2020  3:15 PM Harvel Quale, MD [facility] None

## 2020-09-24 DIAGNOSIS — E1165 Type 2 diabetes mellitus with hyperglycemia: Secondary | ICD-10-CM | POA: Diagnosis not present

## 2020-09-25 DIAGNOSIS — E1165 Type 2 diabetes mellitus with hyperglycemia: Secondary | ICD-10-CM | POA: Diagnosis not present

## 2020-09-26 DIAGNOSIS — E1165 Type 2 diabetes mellitus with hyperglycemia: Secondary | ICD-10-CM | POA: Diagnosis not present

## 2020-09-27 DIAGNOSIS — E1165 Type 2 diabetes mellitus with hyperglycemia: Secondary | ICD-10-CM | POA: Diagnosis not present

## 2020-09-28 DIAGNOSIS — E1165 Type 2 diabetes mellitus with hyperglycemia: Secondary | ICD-10-CM | POA: Diagnosis not present

## 2020-09-29 ENCOUNTER — Other Ambulatory Visit (INDEPENDENT_AMBULATORY_CARE_PROVIDER_SITE_OTHER): Payer: Self-pay | Admitting: Internal Medicine

## 2020-09-29 DIAGNOSIS — E1165 Type 2 diabetes mellitus with hyperglycemia: Secondary | ICD-10-CM | POA: Diagnosis not present

## 2020-09-30 ENCOUNTER — Ambulatory Visit (INDEPENDENT_AMBULATORY_CARE_PROVIDER_SITE_OTHER): Payer: Medicaid Other | Admitting: Nurse Practitioner

## 2020-09-30 ENCOUNTER — Encounter (INDEPENDENT_AMBULATORY_CARE_PROVIDER_SITE_OTHER): Payer: Self-pay

## 2020-09-30 ENCOUNTER — Encounter (INDEPENDENT_AMBULATORY_CARE_PROVIDER_SITE_OTHER): Payer: Self-pay | Admitting: Nurse Practitioner

## 2020-09-30 ENCOUNTER — Other Ambulatory Visit: Payer: Self-pay

## 2020-09-30 DIAGNOSIS — I10 Essential (primary) hypertension: Secondary | ICD-10-CM

## 2020-09-30 DIAGNOSIS — E1165 Type 2 diabetes mellitus with hyperglycemia: Secondary | ICD-10-CM | POA: Diagnosis not present

## 2020-09-30 MED ORDER — CARVEDILOL 6.25 MG PO TABS: 6.2500 mg | ORAL_TABLET | Freq: Two times a day (BID) | ORAL | 0 refills | Status: DC

## 2020-09-30 NOTE — Progress Notes (Signed)
Subjective:  Patient ID: Ana [name], female    DOB: [birth year]  Age: 50 y.o. MRN: [id number]  CC:  Chief Complaint  Patient presents with   Follow-up    Patient is feeling better and not sure if she needs labs      HPI  This patient arrives today for the above.  Hypertension: We made adjustments to her medications at last office visit.  Increased furosemide to 40 mg by mouth daily and stopped hydralazine due to her having pericardial effusion noted on cardiac echocardiogram.  She does continue on carvedilol, amlodipine, and losartan.  She reports that she is feeling much better with the increased dose of the furosemide.  She is being evaluated regularly by cardiology.  She tells me her next appointment is scheduled for the 10th (approximately 1 week from today.)  Past Medical History:  Diagnosis Date   CKD (chronic kidney disease)    Diabetes mellitus    diet controlled   Hypertension       Family History  Problem Relation Age of Onset   Kidney failure Mother    Diabetes Mother    Kidney disease Mother    Kidney failure Father    Kidney disease Father    Kidney failure Brother    Diabetes Brother    Diabetes Brother     Social History   Social History Narrative   Divorced,married for 8 years.Lives with daughter and 3 grandkids.Unemployed.   Social History   Tobacco Use   Smoking status: Never   Smokeless tobacco: Never  Substance Use Topics   Alcohol use: Yes    Comment: occ     Current Meds  Medication Sig   ACCU-CHEK GUIDE test strip daily.   Accu-Chek Softclix Lancets lancets daily.   amLODipine (NORVASC) 10 MG tablet TAKE 1 TABLET(10 MG) BY MOUTH DAILY   blood glucose meter kit and supplies KIT Dispense based on patient and insurance preference. Use daily to check your blood sugar once per day.   Blood Pressure Monitoring (SPHYGMOMANOMETER) MISC 1 each by Does not apply route daily.   Cholecalciferol 25 MCG (1000 UT) capsule Take by mouth.    furosemide (LASIX) 40 MG tablet Take 1 tablet (40 mg total) by mouth daily.   glipiZIDE (GLUCOTROL) 10 MG tablet TAKE 1 TABLET(10 MG) BY MOUTH TWICE DAILY BEFORE A MEAL   levothyroxine (SYNTHROID) 50 MCG tablet TAKE 1 TABLET(50 MCG) BY MOUTH DAILY   losartan (COZAAR) 100 MG tablet Take 1 tablet (100 mg total) by mouth daily.   sodium bicarbonate 650 MG tablet Take 650 mg by mouth 4 (four) times daily.   [DISCONTINUED] carvedilol (COREG) 3.125 MG tablet Take 1 tablet (3.125 mg total) by mouth 2 (two) times daily with a meal.    ROS:  Review of Systems  Respiratory:  Negative for shortness of breath.   Cardiovascular:  Negative for chest pain and leg swelling.  Neurological:  Negative for dizziness and headaches.    Objective:   Today's Vitals: BP (!) 172/86   Pulse 90   Temp (!) 96.8 F (36 C) (Temporal)   Ht 5' (1.524 m)   Wt 206 lb 6.4 oz (93.6 kg)   SpO2 95%   BMI 40.31 kg/m  Vitals with BMI 09/30/2020 09/13/2020 09/02/2020  Height 5' 0"  5' 0"  5' 1"   Weight 206 lbs 6 oz 209 lbs 207 lbs 10 oz  BMI 40.31 65.46 50.35  Systolic 465 681 275  Diastolic 86  80 78  Pulse 90 92 93     Physical Exam Vitals reviewed.  Constitutional:      General: She is not in acute distress.    Appearance: Normal appearance.  HENT:     Head: Normocephalic and atraumatic.  Neck:     Vascular: No carotid bruit.  Cardiovascular:     Rate and Rhythm: Normal rate and regular rhythm.     Pulses: Normal pulses.     Heart sounds: Normal heart sounds.  Pulmonary:     Effort: Pulmonary effort is normal.     Breath sounds: Normal breath sounds.  Skin:    General: Skin is warm and dry.  Neurological:     General: No focal deficit present.     Mental Status: She is alert and oriented to person, place, and time.  Psychiatric:        Mood and Affect: Mood normal.        Behavior: Behavior normal.        Judgment: Judgment normal.         Assessment and Plan   1. Essential hypertension,  benign      Plan: 1.  We will order CMP to monitor electrolytes and kidney function.  She will follow-up with cardiologist as scheduled in 1 week to have blood pressure check.  We will make further recommendations based on blood work results.   Tests ordered Orders Placed This Encounter  Procedures   CMP with eGFR([facility])      Meds ordered this encounter  Medications   carvedilol (COREG) 6.25 MG tablet    Sig: Take 1 tablet (6.25 mg total) by mouth 2 (two) times daily with a meal.    Dispense:  180 tablet    Refill:  0    Order Specific Question:   Supervising Provider    AnswerLindell Spar [[id number]]   Follow-up not scheduled as his office is closing permanently starting 10/27/2020.  Patient was notified of this and told that she will need to find another primary care provider.  She was told that this is due to the death of Dr. Anastasio Champion.  She tells me she understands, she was highly recommended to follow-up with cardiology as scheduled and she says that she will.  Ailene Ards, NP

## 2020-10-01 DIAGNOSIS — E1165 Type 2 diabetes mellitus with hyperglycemia: Secondary | ICD-10-CM | POA: Diagnosis not present

## 2020-10-01 DIAGNOSIS — I10 Essential (primary) hypertension: Secondary | ICD-10-CM | POA: Diagnosis not present

## 2020-10-02 DIAGNOSIS — E1165 Type 2 diabetes mellitus with hyperglycemia: Secondary | ICD-10-CM | POA: Diagnosis not present

## 2020-10-02 LAB — COMPLETE METABOLIC PANEL WITH GFR
AG Ratio: 1.1 (calc) (ref 1.0–2.5)
ALT: 12 U/L (ref 6–29)
AST: 16 U/L (ref 10–35)
Albumin: 3.6 g/dL (ref 3.6–5.1)
Alkaline phosphatase (APISO): 148 U/L (ref 37–153)
BUN/Creatinine Ratio: 14 (calc) (ref 6–22)
BUN: 31 mg/dL — ABNORMAL HIGH (ref 7–25)
CO2: 23 mmol/L (ref 20–32)
Calcium: 8.9 mg/dL (ref 8.6–10.4)
Chloride: 105 mmol/L (ref 98–110)
Creat: 2.16 mg/dL — ABNORMAL HIGH (ref 0.50–1.03)
Globulin: 3.3 g/dL (calc) (ref 1.9–3.7)
Glucose, Bld: 135 mg/dL (ref 65–139)
Potassium: 4.4 mmol/L (ref 3.5–5.3)
Sodium: 140 mmol/L (ref 135–146)
Total Bilirubin: 0.5 mg/dL (ref 0.2–1.2)
Total Protein: 6.9 g/dL (ref 6.1–8.1)
eGFR: 27 mL/min/{1.73_m2} — ABNORMAL LOW (ref >=60)

## 2020-10-03 DIAGNOSIS — E1165 Type 2 diabetes mellitus with hyperglycemia: Secondary | ICD-10-CM | POA: Diagnosis not present

## 2020-10-04 DIAGNOSIS — E1165 Type 2 diabetes mellitus with hyperglycemia: Secondary | ICD-10-CM | POA: Diagnosis not present

## 2020-10-05 DIAGNOSIS — E1165 Type 2 diabetes mellitus with hyperglycemia: Secondary | ICD-10-CM | POA: Diagnosis not present

## 2020-10-05 NOTE — Progress Notes (Signed)
Cardiology Office Note   Date:  10/06/2020   ID:  Ana [name], DOB 1970/11/22, MRN [id number]  PCP:  Ana Albee, MD (Inactive)  Cardiologist:   Ana Carnes, MD   Pt referred for edema, SOB   History of Present Illness: Ana [name] is a 50 y.o. female with no known cardiac problems   She si followed by Ana Lites, NP.  She is also being seen by nephrology (Dr Ana [name])   In June 2022 she was seen in renal  clinic BP  At that visit BP was elevated   Recomm hydralazine increasd.  Autoimmune work up neg  Hep C Ab is +   Recomm Na bicarb    In early July 2022 she was seen by Ana [name].  Noted leg swelling and also pressure in chest and SOB when laying down   Also noted chest heaviness with activity   When she would stop it got better    Note that her BP ws high at that visit  I saw the pt on September 13 2020  Echo ordered   This showed mod LVH  Vigorous LV function  Small circumferential  pericardial effusion   Will plan to follow    Since then she  has been seen by Ana [name]   Furosemide added    The pt  says she is breathing much easier   Still has some ankle swelling Has a scale but does not weigh herself    Does not have BP cuff  Current Meds  Medication Sig   ACCU-CHEK GUIDE test strip daily.   Accu-Chek Softclix Lancets lancets daily.   amLODipine (NORVASC) 10 MG tablet TAKE 1 TABLET(10 MG) BY MOUTH DAILY   blood glucose meter kit and supplies KIT Dispense based on patient and insurance preference. Use daily to check your blood sugar once per day.   Blood Pressure Monitoring (SPHYGMOMANOMETER) MISC 1 each by Does not apply route daily.   carvedilol (COREG) 12.5 MG tablet Take 1 tablet (12.5 mg total) by mouth 2 (two) times daily.   Cholecalciferol 25 MCG (1000 UT) capsule Take by mouth.   furosemide (LASIX) 40 MG tablet Take 1 tablet (40 mg total) by mouth daily.   glipiZIDE (GLUCOTROL) 10 MG tablet TAKE 1 TABLET(10 MG) BY MOUTH TWICE DAILY BEFORE A MEAL   levothyroxine (SYNTHROID)  50 MCG tablet TAKE 1 TABLET(50 MCG) BY MOUTH DAILY   losartan (COZAAR) 100 MG tablet Take 1 tablet (100 mg total) by mouth daily.   sodium bicarbonate 650 MG tablet Take 650 mg by mouth 4 (four) times daily.   [DISCONTINUED] carvedilol (COREG) 6.25 MG tablet Take 1 tablet (6.25 mg total) by mouth 2 (two) times daily with a meal.     Allergies:   Ranitidine and Ranitidine hcl   Past Medical History:  Diagnosis Date   CKD (chronic kidney disease)    Diabetes mellitus    diet controlled   Hypertension     Past Surgical History:  Procedure Laterality Date   CESAREAN SECTION  x4   CHOLECYSTECTOMY     MASS EXCISION  10/27/2011   Procedure: EXCISION MASS;  Surgeon: Donato Heinz, MD;  Location: [facility];  Service: General;  Laterality: Right;   TUBAL LIGATION       Social History:  The patient  reports that she has never smoked. She has never used smokeless tobacco. She reports current alcohol use. She reports that she does  not use drugs.   Family History:  The patient's family history includes Diabetes in her brother, brother, and mother; Kidney disease in her father and mother; Kidney failure in her brother, father, and mother.    ROS:  Please see the history of present illness. All other systems are reviewed and  Negative to the above problem except as noted.    PHYSICAL EXAM: VS:  BP (!) 180/80 (BP Location: Left Arm, Patient Position: Sitting, Cuff Size: Large)   Pulse 82   Ht 5' (1.524 m)   Wt 201 lb (91.2 kg)   SpO2 98%   BMI 39.26 kg/m   GEN: Morbidly obese 50 yo  in no acute distress  HEENT: normal  Neck: JVP is not elevated Cardiac: RRR; no murmurs,,1+ LE edema  Respiratory:  clear to auscultation bilaterally GI: soft, nontender, nondistended, + BS  No hepatomegaly  MS: no deformity Moving all extremities   Skin: warm and dry, no rash Neuro:  Strength and sensation are intact Psych: euthymic mood, full affect   EKG:  EKG is not ordered today.  On 09/02/20 SR  present   (read out as atrial flutter mistakenly   Lipid Panel    Component Value Date/Time   CHOL 233 (H) 03/18/2020 0000   TRIG 69 03/18/2020 0000   HDL 63 03/18/2020 0000   CHOLHDL 3.7 03/18/2020 0000   LDLCALC 154 (H) 03/18/2020 0000      Wt Readings from Last 3 Encounters:  10/06/20 201 lb (91.2 kg)  09/30/20 206 lb 6.4 oz (93.6 kg)  09/13/20 209 lb (94.8 kg)      ASSESSMENT AND PLAN:  1   HTN   BP rmeains elevated   I would recomm increasing carvedilol to 12.5 bid    She is due to be seen in renal clinic tomorrow   I would favor person take charge of BP   Will forward to Dr Ana [name]  With renal problems, optimizing meds while closely following renal function is critical  2  LE edema   Improved from when I saw her last    Still with mild swelling   Watch NA    3   Renal   Has appt tomorrow  ]  Follow up based on test results

## 2020-10-06 ENCOUNTER — Other Ambulatory Visit: Payer: Self-pay

## 2020-10-06 ENCOUNTER — Encounter: Payer: Self-pay | Admitting: Internal Medicine

## 2020-10-06 ENCOUNTER — Ambulatory Visit: Payer: Medicaid Other | Admitting: Internal Medicine

## 2020-10-06 VITALS — BP 180/80 | HR 82 | Ht 60.0 in | Wt 201.0 lb

## 2020-10-06 DIAGNOSIS — I10 Essential (primary) hypertension: Secondary | ICD-10-CM | POA: Diagnosis not present

## 2020-10-06 DIAGNOSIS — E1165 Type 2 diabetes mellitus with hyperglycemia: Secondary | ICD-10-CM | POA: Diagnosis not present

## 2020-10-06 MED ORDER — CARVEDILOL 12.5 MG PO TABS: 12.5000 mg | ORAL_TABLET | Freq: Two times a day (BID) | ORAL | 3 refills | Status: DC

## 2020-10-06 NOTE — Patient Instructions (Signed)
Medication Instructions:   Increase Coreg to 12.5 mg Two Times Daily   *If you need a refill on your cardiac medications before your next appointment, please call your pharmacy*   Lab Work: NONE   If you have labs (blood work) drawn today and your tests are completely normal, you will receive your results only by: [place] (if you have MyChart) OR A paper copy in the mail If you have any lab test that is abnormal or we need to change your treatment, we will call you to review the results.   Testing/Procedures: NONE    Follow-Up: At [facility], you and your health needs are our priority.  As part of our continuing mission to provide you with exceptional heart care, we have created designated Provider Care Teams.  These Care Teams include your primary Cardiologist (physician) and Advanced Practice Providers (APPs -  Physician Assistants and Nurse Practitioners) who all work together to provide you with the care you need, when you need it.  We recommend signing up for the patient portal called "MyChart".  Sign up information is provided on this After Visit Summary.  MyChart is used to connect with patients for Virtual Visits (Telemedicine).  Patients are able to view lab/test results, encounter notes, upcoming appointments, etc.  Non-urgent messages can be sent to your provider as well.   To learn more about what you can do with MyChart, go to [email or web address].    Your next appointment:   4 month(s)  The format for your next appointment:   In Person  Provider:   Dorris Carnes, MD   Other Instructions Thank you for choosing [facility]!

## 2020-10-07 DIAGNOSIS — R809 Proteinuria, unspecified: Secondary | ICD-10-CM | POA: Diagnosis not present

## 2020-10-07 DIAGNOSIS — E6609 Other obesity due to excess calories: Secondary | ICD-10-CM | POA: Diagnosis not present

## 2020-10-07 DIAGNOSIS — E1122 Type 2 diabetes mellitus with diabetic chronic kidney disease: Secondary | ICD-10-CM | POA: Diagnosis not present

## 2020-10-07 DIAGNOSIS — D638 Anemia in other chronic diseases classified elsewhere: Secondary | ICD-10-CM | POA: Diagnosis not present

## 2020-10-07 DIAGNOSIS — R600 Localized edema: Secondary | ICD-10-CM | POA: Diagnosis not present

## 2020-10-07 DIAGNOSIS — E1129 Type 2 diabetes mellitus with other diabetic kidney complication: Secondary | ICD-10-CM | POA: Diagnosis not present

## 2020-10-07 DIAGNOSIS — E559 Vitamin D deficiency, unspecified: Secondary | ICD-10-CM | POA: Diagnosis not present

## 2020-10-07 DIAGNOSIS — R808 Other proteinuria: Secondary | ICD-10-CM | POA: Diagnosis not present

## 2020-10-07 DIAGNOSIS — E1165 Type 2 diabetes mellitus with hyperglycemia: Secondary | ICD-10-CM | POA: Diagnosis not present

## 2020-10-07 DIAGNOSIS — N189 Chronic kidney disease, unspecified: Secondary | ICD-10-CM | POA: Diagnosis not present

## 2020-10-07 DIAGNOSIS — I129 Hypertensive chronic kidney disease with stage 1 through stage 4 chronic kidney disease, or unspecified chronic kidney disease: Secondary | ICD-10-CM | POA: Diagnosis not present

## 2020-10-08 DIAGNOSIS — E1165 Type 2 diabetes mellitus with hyperglycemia: Secondary | ICD-10-CM | POA: Diagnosis not present

## 2020-10-09 DIAGNOSIS — E1165 Type 2 diabetes mellitus with hyperglycemia: Secondary | ICD-10-CM | POA: Diagnosis not present

## 2020-10-10 DIAGNOSIS — E1165 Type 2 diabetes mellitus with hyperglycemia: Secondary | ICD-10-CM | POA: Diagnosis not present

## 2020-10-11 DIAGNOSIS — E1165 Type 2 diabetes mellitus with hyperglycemia: Secondary | ICD-10-CM | POA: Diagnosis not present

## 2020-10-12 DIAGNOSIS — E1165 Type 2 diabetes mellitus with hyperglycemia: Secondary | ICD-10-CM | POA: Diagnosis not present

## 2020-10-13 DIAGNOSIS — E1165 Type 2 diabetes mellitus with hyperglycemia: Secondary | ICD-10-CM | POA: Diagnosis not present

## 2020-10-14 DIAGNOSIS — E1165 Type 2 diabetes mellitus with hyperglycemia: Secondary | ICD-10-CM | POA: Diagnosis not present

## 2020-10-15 DIAGNOSIS — E1165 Type 2 diabetes mellitus with hyperglycemia: Secondary | ICD-10-CM | POA: Diagnosis not present

## 2020-10-16 ENCOUNTER — Other Ambulatory Visit: Payer: Self-pay

## 2020-10-16 ENCOUNTER — Ambulatory Visit
Admission: EM | Admit: 2020-10-16 | Discharge: 2020-10-16 | Disposition: A | Discharge status: Home / Self Care | Payer: Medicaid Other | Attending: Internal Medicine | Admitting: Internal Medicine

## 2020-10-16 DIAGNOSIS — E1165 Type 2 diabetes mellitus with hyperglycemia: Secondary | ICD-10-CM | POA: Diagnosis not present

## 2020-10-16 DIAGNOSIS — M79604 Pain in right leg: Secondary | ICD-10-CM | POA: Diagnosis not present

## 2020-10-16 MED ORDER — DICLOFENAC SODIUM 1 % EX GEL: 4.0000 g | Freq: Four times a day (QID) | CUTANEOUS | 0 refills | Status: DC

## 2020-10-16 NOTE — Discharge Instructions (Addendum)
Apply cream to affected area Gentle range of motion exercises If symptoms worsens please follow up with your PCP or nephrologist.

## 2020-10-16 NOTE — ED Triage Notes (Signed)
Pt present right leg pain with swelling, symptom started two days ago. Pt states that she had fluid in her legs and after the fluid left the right leg begin to give her pain.

## 2020-10-17 DIAGNOSIS — E1165 Type 2 diabetes mellitus with hyperglycemia: Secondary | ICD-10-CM | POA: Diagnosis not present

## 2020-10-17 NOTE — ED Provider Notes (Signed)
[facility]    CSN: [id number] Arrival date & time: 10/16/20  1325      History   Chief Complaint Chief Complaint  Patient presents with   Leg Pain    HPI Ana [name] is a 50 y.o. female with a history of chronic kidney disease stage IV comes to the urgent care with over 1 week history of right thigh and right shin pain.  Pain is severe, constant, aggravated by palpation and movement with no known relieving factors.  No trauma or falls.  No redness of the skin.  No fevers or chills.  No calf pain.  Patient denies any long distance traveling.   HPI  Past Medical History:  Diagnosis Date   CKD (chronic kidney disease)    Diabetes mellitus    diet controlled   Hypertension     Patient Active Problem List   Diagnosis Date Noted   Hypothyroidism, adult 09/02/2020   Vitamin D deficiency disease 09/02/2020   Uncontrolled type 2 diabetes mellitus with hyperglycemia ([place]) 09/26/2017   Essential hypertension, benign 09/26/2017   Mixed hyperlipidemia 09/26/2017   Class 2 severe obesity due to excess calories with serious comorbidity and body mass index (BMI) of 37.0 to 37.9 in adult ([place]) 09/26/2017    Past Surgical History:  Procedure Laterality Date   CESAREAN SECTION  x4   CHOLECYSTECTOMY     MASS EXCISION  10/27/2011   Procedure: EXCISION MASS;  Surgeon: Donato Heinz, MD;  Location: [facility];  Service: General;  Laterality: Right;   TUBAL LIGATION      OB History     Gravida  6   Para  5   Term      Preterm  5   AB  1   Living         SAB  1   IAB      Ectopic      Multiple      Live Births               Home Medications    Prior to Admission medications   Medication Sig Start Date End Date Taking? Authorizing Provider  diclofenac Sodium (VOLTAREN) 1 % GEL Apply 4 g topically 4 (four) times daily. 10/16/20  Yes Tishara Pizano, Myrene Galas, MD  ACCU-CHEK GUIDE test strip daily. 10/15/19   [provider]  Accu-Chek Softclix  Lancets lancets daily. 10/14/19   [provider]  amLODipine (NORVASC) 10 MG tablet TAKE 1 TABLET(10 MG) BY MOUTH DAILY 08/25/20   Doree Albee, MD  blood glucose meter kit and supplies KIT Dispense based on patient and insurance preference. Use daily to check your blood sugar once per day. 08/03/20   Ailene Ards, NP  Blood Pressure Monitoring [facility]) MISC 1 each by Does not apply route daily. 10/16/19   Ailene Ards, NP  carvedilol (COREG) 12.5 MG tablet Take 1 tablet (12.5 mg total) by mouth 2 (two) times daily. 10/06/20 01/04/21  Fay Records, MD  Cholecalciferol 25 MCG (1000 UT) capsule Take by mouth. 07/28/20 07/28/21  [provider]  furosemide (LASIX) 40 MG tablet Take 1 tablet (40 mg total) by mouth daily. 09/21/20   Ailene Ards, NP  glipiZIDE (GLUCOTROL) 10 MG tablet TAKE 1 TABLET(10 MG) BY MOUTH TWICE DAILY BEFORE A MEAL 09/09/20   Hurshel Party C, MD  levothyroxine (SYNTHROID) 50 MCG tablet TAKE 1 TABLET(50 MCG) BY MOUTH DAILY 08/16/20   Gosrani, Nimish C,  MD  losartan (COZAAR) 100 MG tablet Take 1 tablet (100 mg total) by mouth daily. 09/29/20   Ailene Ards, NP  sodium bicarbonate 650 MG tablet Take 650 mg by mouth 4 (four) times daily.    [provider]    Family History Family History  Problem Relation Age of Onset   Kidney failure Mother    Diabetes Mother    Kidney disease Mother    Kidney failure Father    Kidney disease Father    Kidney failure Brother    Diabetes Brother    Diabetes Brother     Social History Social History   Tobacco Use   Smoking status: Never   Smokeless tobacco: Never  Vaping Use   Vaping Use: Never used  Substance Use Topics   Alcohol use: Yes    Comment: occ   Drug use: No     Allergies   Ranitidine and Ranitidine hcl   Review of Systems Review of Systems  Constitutional: Negative.   Cardiovascular: Negative.   Gastrointestinal: Negative.   Musculoskeletal:  Positive for myalgias.     Physical Exam Triage Vital Signs ED Triage Vitals [10/16/20 1507]  Enc Vitals Group     BP (!) 150/78     Pulse Rate 72     Resp 16     Temp 98.4 F (36.9 C)     Temp Source Oral     SpO2 96 %     Weight      Height      Head Circumference      Peak Flow      Pain Score 9     Pain Loc      Pain Edu?      Excl. in [place]?    No data found.  Updated Vital Signs BP (!) 150/78 (BP Location: Right Arm)   Pulse 72   Temp 98.4 F (36.9 C) (Oral)   Resp 16   SpO2 96%   Visual Acuity Right Eye Distance:   Left Eye Distance:   Bilateral Distance:    Right Eye Near:   Left Eye Near:    Bilateral Near:     Physical Exam Vitals and nursing note reviewed.  Cardiovascular:     Rate and Rhythm: Normal rate and regular rhythm.     Pulses: Normal pulses.     Heart sounds: Normal heart sounds.  Pulmonary:     Effort: Pulmonary effort is normal.     Breath sounds: Normal breath sounds.  Musculoskeletal:        General: Tenderness present. No swelling, deformity or signs of injury. Normal range of motion.  Skin:    General: Skin is warm.     Capillary Refill: Capillary refill takes less than 2 seconds.     Findings: No bruising or erythema.  Neurological:     Mental Status: She is alert.     UC Treatments / Results  Labs (all labs ordered are listed, but only abnormal results are displayed) Labs Reviewed - No data to display  EKG   Radiology No results found.  Procedures Procedures (including critical care time)  Medications Ordered in UC Medications - No data to display  Initial Impression / Assessment and Plan / UC Course  I have reviewed the triage vital signs and the nursing notes.  Pertinent labs & imaging results that were available during my care of the patient were reviewed by me and considered in my medical decision making (  see chart for details).    Right leg pain: Diclofenac gel to be applied to the area as needed No indication for x-rays at  this point.  Patient's options for anti-inflammatory agents for pain control is limited given her history of kidney disease.  I advised patient to follow-up with a PCP/Nephrologist  Final Clinical Impressions(s) / UC Diagnoses   Final diagnoses:  Right leg pain     Discharge Instructions      Apply cream to affected area Gentle range of motion exercises If symptoms worsens please follow up with your PCP or nephrologist.   ED Prescriptions     Medication Sig Dispense Auth. Provider   diclofenac Sodium (VOLTAREN) 1 % GEL Apply 4 g topically 4 (four) times daily. 150 g Yocelin Vanlue, Myrene Galas, MD      PDMP not reviewed this encounter.   Chase Picket, MD 10/18/20 [phone]

## 2020-10-18 DIAGNOSIS — E1165 Type 2 diabetes mellitus with hyperglycemia: Secondary | ICD-10-CM | POA: Diagnosis not present

## 2020-10-19 DIAGNOSIS — E1165 Type 2 diabetes mellitus with hyperglycemia: Secondary | ICD-10-CM | POA: Diagnosis not present

## 2020-10-20 DIAGNOSIS — E1165 Type 2 diabetes mellitus with hyperglycemia: Secondary | ICD-10-CM | POA: Diagnosis not present

## 2020-10-21 DIAGNOSIS — E1165 Type 2 diabetes mellitus with hyperglycemia: Secondary | ICD-10-CM | POA: Diagnosis not present

## 2020-10-22 DIAGNOSIS — E1165 Type 2 diabetes mellitus with hyperglycemia: Secondary | ICD-10-CM | POA: Diagnosis not present

## 2020-10-23 DIAGNOSIS — E1165 Type 2 diabetes mellitus with hyperglycemia: Secondary | ICD-10-CM | POA: Diagnosis not present

## 2020-10-24 DIAGNOSIS — E1165 Type 2 diabetes mellitus with hyperglycemia: Secondary | ICD-10-CM | POA: Diagnosis not present

## 2020-10-25 DIAGNOSIS — E1165 Type 2 diabetes mellitus with hyperglycemia: Secondary | ICD-10-CM | POA: Diagnosis not present

## 2020-10-26 DIAGNOSIS — E1165 Type 2 diabetes mellitus with hyperglycemia: Secondary | ICD-10-CM | POA: Diagnosis not present

## 2020-10-27 DIAGNOSIS — E1165 Type 2 diabetes mellitus with hyperglycemia: Secondary | ICD-10-CM | POA: Diagnosis not present

## 2020-10-28 DIAGNOSIS — E1165 Type 2 diabetes mellitus with hyperglycemia: Secondary | ICD-10-CM | POA: Diagnosis not present

## 2020-10-29 DIAGNOSIS — E1165 Type 2 diabetes mellitus with hyperglycemia: Secondary | ICD-10-CM | POA: Diagnosis not present

## 2020-10-30 DIAGNOSIS — E1165 Type 2 diabetes mellitus with hyperglycemia: Secondary | ICD-10-CM | POA: Diagnosis not present

## 2020-10-31 DIAGNOSIS — E1165 Type 2 diabetes mellitus with hyperglycemia: Secondary | ICD-10-CM | POA: Diagnosis not present

## 2020-11-01 DIAGNOSIS — E1165 Type 2 diabetes mellitus with hyperglycemia: Secondary | ICD-10-CM | POA: Diagnosis not present

## 2020-11-02 DIAGNOSIS — E1165 Type 2 diabetes mellitus with hyperglycemia: Secondary | ICD-10-CM | POA: Diagnosis not present

## 2020-11-03 DIAGNOSIS — E1165 Type 2 diabetes mellitus with hyperglycemia: Secondary | ICD-10-CM | POA: Diagnosis not present

## 2020-11-04 DIAGNOSIS — E1165 Type 2 diabetes mellitus with hyperglycemia: Secondary | ICD-10-CM | POA: Diagnosis not present

## 2020-11-05 DIAGNOSIS — E1165 Type 2 diabetes mellitus with hyperglycemia: Secondary | ICD-10-CM | POA: Diagnosis not present

## 2020-11-06 DIAGNOSIS — E1165 Type 2 diabetes mellitus with hyperglycemia: Secondary | ICD-10-CM | POA: Diagnosis not present

## 2020-11-07 DIAGNOSIS — E1165 Type 2 diabetes mellitus with hyperglycemia: Secondary | ICD-10-CM | POA: Diagnosis not present

## 2020-11-08 DIAGNOSIS — E1165 Type 2 diabetes mellitus with hyperglycemia: Secondary | ICD-10-CM | POA: Diagnosis not present

## 2020-11-09 DIAGNOSIS — E1165 Type 2 diabetes mellitus with hyperglycemia: Secondary | ICD-10-CM | POA: Diagnosis not present

## 2020-11-10 DIAGNOSIS — E1165 Type 2 diabetes mellitus with hyperglycemia: Secondary | ICD-10-CM | POA: Diagnosis not present

## 2020-11-11 DIAGNOSIS — E1165 Type 2 diabetes mellitus with hyperglycemia: Secondary | ICD-10-CM | POA: Diagnosis not present

## 2020-11-12 DIAGNOSIS — E1165 Type 2 diabetes mellitus with hyperglycemia: Secondary | ICD-10-CM | POA: Diagnosis not present

## 2020-11-13 DIAGNOSIS — E1165 Type 2 diabetes mellitus with hyperglycemia: Secondary | ICD-10-CM | POA: Diagnosis not present

## 2020-11-14 DIAGNOSIS — E1165 Type 2 diabetes mellitus with hyperglycemia: Secondary | ICD-10-CM | POA: Diagnosis not present

## 2020-11-15 DIAGNOSIS — E1165 Type 2 diabetes mellitus with hyperglycemia: Secondary | ICD-10-CM | POA: Diagnosis not present

## 2020-11-16 DIAGNOSIS — E1165 Type 2 diabetes mellitus with hyperglycemia: Secondary | ICD-10-CM | POA: Diagnosis not present

## 2020-11-17 DIAGNOSIS — E1165 Type 2 diabetes mellitus with hyperglycemia: Secondary | ICD-10-CM | POA: Diagnosis not present

## 2020-11-18 DIAGNOSIS — E1165 Type 2 diabetes mellitus with hyperglycemia: Secondary | ICD-10-CM | POA: Diagnosis not present

## 2020-11-19 DIAGNOSIS — E1165 Type 2 diabetes mellitus with hyperglycemia: Secondary | ICD-10-CM | POA: Diagnosis not present

## 2020-11-19 LAB — HM DIABETES EYE EXAM

## 2020-11-20 DIAGNOSIS — E1165 Type 2 diabetes mellitus with hyperglycemia: Secondary | ICD-10-CM | POA: Diagnosis not present

## 2020-11-21 DIAGNOSIS — E1165 Type 2 diabetes mellitus with hyperglycemia: Secondary | ICD-10-CM | POA: Diagnosis not present

## 2020-11-22 DIAGNOSIS — E1165 Type 2 diabetes mellitus with hyperglycemia: Secondary | ICD-10-CM | POA: Diagnosis not present

## 2020-11-23 DIAGNOSIS — E1165 Type 2 diabetes mellitus with hyperglycemia: Secondary | ICD-10-CM | POA: Diagnosis not present

## 2020-11-24 DIAGNOSIS — E1165 Type 2 diabetes mellitus with hyperglycemia: Secondary | ICD-10-CM | POA: Diagnosis not present

## 2020-11-25 DIAGNOSIS — E1165 Type 2 diabetes mellitus with hyperglycemia: Secondary | ICD-10-CM | POA: Diagnosis not present

## 2020-11-26 DIAGNOSIS — E1165 Type 2 diabetes mellitus with hyperglycemia: Secondary | ICD-10-CM | POA: Diagnosis not present

## 2020-11-27 DIAGNOSIS — E1165 Type 2 diabetes mellitus with hyperglycemia: Secondary | ICD-10-CM | POA: Diagnosis not present

## 2020-11-28 DIAGNOSIS — E1165 Type 2 diabetes mellitus with hyperglycemia: Secondary | ICD-10-CM | POA: Diagnosis not present

## 2020-11-29 DIAGNOSIS — E1165 Type 2 diabetes mellitus with hyperglycemia: Secondary | ICD-10-CM | POA: Diagnosis not present

## 2020-11-30 DIAGNOSIS — E1165 Type 2 diabetes mellitus with hyperglycemia: Secondary | ICD-10-CM | POA: Diagnosis not present

## 2020-12-01 DIAGNOSIS — E1165 Type 2 diabetes mellitus with hyperglycemia: Secondary | ICD-10-CM | POA: Diagnosis not present

## 2020-12-02 DIAGNOSIS — E1165 Type 2 diabetes mellitus with hyperglycemia: Secondary | ICD-10-CM | POA: Diagnosis not present

## 2020-12-03 DIAGNOSIS — E1165 Type 2 diabetes mellitus with hyperglycemia: Secondary | ICD-10-CM | POA: Diagnosis not present

## 2020-12-04 DIAGNOSIS — E1165 Type 2 diabetes mellitus with hyperglycemia: Secondary | ICD-10-CM | POA: Diagnosis not present

## 2020-12-05 DIAGNOSIS — E1165 Type 2 diabetes mellitus with hyperglycemia: Secondary | ICD-10-CM | POA: Diagnosis not present

## 2020-12-06 ENCOUNTER — Ambulatory Visit (INDEPENDENT_AMBULATORY_CARE_PROVIDER_SITE_OTHER): Payer: Medicaid Other | Admitting: Gastroenterology

## 2020-12-06 DIAGNOSIS — E1165 Type 2 diabetes mellitus with hyperglycemia: Secondary | ICD-10-CM | POA: Diagnosis not present

## 2020-12-07 DIAGNOSIS — E1165 Type 2 diabetes mellitus with hyperglycemia: Secondary | ICD-10-CM | POA: Diagnosis not present

## 2020-12-08 DIAGNOSIS — E1165 Type 2 diabetes mellitus with hyperglycemia: Secondary | ICD-10-CM | POA: Diagnosis not present

## 2020-12-09 DIAGNOSIS — E1165 Type 2 diabetes mellitus with hyperglycemia: Secondary | ICD-10-CM | POA: Diagnosis not present

## 2020-12-10 ENCOUNTER — Other Ambulatory Visit (INDEPENDENT_AMBULATORY_CARE_PROVIDER_SITE_OTHER): Payer: Self-pay | Admitting: Nurse Practitioner

## 2020-12-10 DIAGNOSIS — E1165 Type 2 diabetes mellitus with hyperglycemia: Secondary | ICD-10-CM | POA: Diagnosis not present

## 2020-12-11 DIAGNOSIS — E1165 Type 2 diabetes mellitus with hyperglycemia: Secondary | ICD-10-CM | POA: Diagnosis not present

## 2020-12-12 DIAGNOSIS — E1165 Type 2 diabetes mellitus with hyperglycemia: Secondary | ICD-10-CM | POA: Diagnosis not present

## 2020-12-13 DIAGNOSIS — E1165 Type 2 diabetes mellitus with hyperglycemia: Secondary | ICD-10-CM | POA: Diagnosis not present

## 2020-12-14 DIAGNOSIS — E1165 Type 2 diabetes mellitus with hyperglycemia: Secondary | ICD-10-CM | POA: Diagnosis not present

## 2020-12-15 DIAGNOSIS — E1165 Type 2 diabetes mellitus with hyperglycemia: Secondary | ICD-10-CM | POA: Diagnosis not present

## 2020-12-16 DIAGNOSIS — E1165 Type 2 diabetes mellitus with hyperglycemia: Secondary | ICD-10-CM | POA: Diagnosis not present

## 2020-12-24 DIAGNOSIS — E1165 Type 2 diabetes mellitus with hyperglycemia: Secondary | ICD-10-CM | POA: Diagnosis not present

## 2020-12-25 DIAGNOSIS — E1165 Type 2 diabetes mellitus with hyperglycemia: Secondary | ICD-10-CM | POA: Diagnosis not present

## 2020-12-26 DIAGNOSIS — E1165 Type 2 diabetes mellitus with hyperglycemia: Secondary | ICD-10-CM | POA: Diagnosis not present

## 2020-12-27 DIAGNOSIS — E1165 Type 2 diabetes mellitus with hyperglycemia: Secondary | ICD-10-CM | POA: Diagnosis not present

## 2020-12-28 DIAGNOSIS — E1165 Type 2 diabetes mellitus with hyperglycemia: Secondary | ICD-10-CM | POA: Diagnosis not present

## 2020-12-29 DIAGNOSIS — E1165 Type 2 diabetes mellitus with hyperglycemia: Secondary | ICD-10-CM | POA: Diagnosis not present

## 2020-12-30 DIAGNOSIS — E1165 Type 2 diabetes mellitus with hyperglycemia: Secondary | ICD-10-CM | POA: Diagnosis not present

## 2020-12-31 DIAGNOSIS — E1165 Type 2 diabetes mellitus with hyperglycemia: Secondary | ICD-10-CM | POA: Diagnosis not present

## 2021-01-01 DIAGNOSIS — E1165 Type 2 diabetes mellitus with hyperglycemia: Secondary | ICD-10-CM | POA: Diagnosis not present

## 2021-01-02 DIAGNOSIS — E1165 Type 2 diabetes mellitus with hyperglycemia: Secondary | ICD-10-CM | POA: Diagnosis not present

## 2021-01-03 DIAGNOSIS — E1165 Type 2 diabetes mellitus with hyperglycemia: Secondary | ICD-10-CM | POA: Diagnosis not present

## 2021-01-04 DIAGNOSIS — E1165 Type 2 diabetes mellitus with hyperglycemia: Secondary | ICD-10-CM | POA: Diagnosis not present

## 2021-01-05 DIAGNOSIS — E1165 Type 2 diabetes mellitus with hyperglycemia: Secondary | ICD-10-CM | POA: Diagnosis not present

## 2021-01-06 DIAGNOSIS — E1165 Type 2 diabetes mellitus with hyperglycemia: Secondary | ICD-10-CM | POA: Diagnosis not present

## 2021-01-07 DIAGNOSIS — E1165 Type 2 diabetes mellitus with hyperglycemia: Secondary | ICD-10-CM | POA: Diagnosis not present

## 2021-01-08 ENCOUNTER — Other Ambulatory Visit (INDEPENDENT_AMBULATORY_CARE_PROVIDER_SITE_OTHER): Payer: Self-pay | Admitting: Nurse Practitioner

## 2021-01-08 DIAGNOSIS — I10 Essential (primary) hypertension: Secondary | ICD-10-CM

## 2021-01-08 DIAGNOSIS — E1165 Type 2 diabetes mellitus with hyperglycemia: Secondary | ICD-10-CM | POA: Diagnosis not present

## 2021-01-09 DIAGNOSIS — E1165 Type 2 diabetes mellitus with hyperglycemia: Secondary | ICD-10-CM | POA: Diagnosis not present

## 2021-01-10 DIAGNOSIS — E1165 Type 2 diabetes mellitus with hyperglycemia: Secondary | ICD-10-CM | POA: Diagnosis not present

## 2021-01-11 DIAGNOSIS — E1165 Type 2 diabetes mellitus with hyperglycemia: Secondary | ICD-10-CM | POA: Diagnosis not present

## 2021-01-12 ENCOUNTER — Ambulatory Visit: Payer: Medicaid Other | Admitting: Nurse Practitioner

## 2021-01-12 ENCOUNTER — Encounter: Payer: Self-pay | Admitting: Nurse Practitioner

## 2021-01-12 ENCOUNTER — Other Ambulatory Visit: Payer: Self-pay

## 2021-01-12 VITALS — BP 214/102 | HR 82 | Ht 60.0 in | Wt 174.0 lb

## 2021-01-12 DIAGNOSIS — I10 Essential (primary) hypertension: Secondary | ICD-10-CM

## 2021-01-12 DIAGNOSIS — E559 Vitamin D deficiency, unspecified: Secondary | ICD-10-CM

## 2021-01-12 DIAGNOSIS — E039 Hypothyroidism, unspecified: Secondary | ICD-10-CM | POA: Diagnosis not present

## 2021-01-12 DIAGNOSIS — Z1231 Encounter for screening mammogram for malignant neoplasm of breast: Secondary | ICD-10-CM | POA: Diagnosis not present

## 2021-01-12 DIAGNOSIS — E11641 Type 2 diabetes mellitus with hypoglycemia with coma: Secondary | ICD-10-CM

## 2021-01-12 DIAGNOSIS — R519 Headache, unspecified: Secondary | ICD-10-CM | POA: Insufficient documentation

## 2021-01-12 DIAGNOSIS — E782 Mixed hyperlipidemia: Secondary | ICD-10-CM | POA: Diagnosis not present

## 2021-01-12 DIAGNOSIS — E1165 Type 2 diabetes mellitus with hyperglycemia: Secondary | ICD-10-CM | POA: Diagnosis not present

## 2021-01-12 MED ORDER — GLIPIZIDE 10 MG PO TABS: ORAL_TABLET | ORAL | 1 refills | Status: DC

## 2021-01-12 MED ORDER — CARVEDILOL 12.5 MG PO TABS: 12.5000 mg | ORAL_TABLET | Freq: Two times a day (BID) | ORAL | 1 refills | Status: DC

## 2021-01-12 MED ORDER — LEVOTHYROXINE SODIUM 50 MCG PO TABS: ORAL_TABLET | ORAL | 1 refills | Status: DC

## 2021-01-12 MED ORDER — FUROSEMIDE 20 MG PO TABS: 20.0000 mg | ORAL_TABLET | Freq: Every day | ORAL | 1 refills | Status: DC

## 2021-01-12 MED ORDER — AMLODIPINE BESYLATE 10 MG PO TABS: ORAL_TABLET | ORAL | 1 refills | Status: DC

## 2021-01-12 MED ORDER — LOSARTAN POTASSIUM 100 MG PO TABS: 100.0000 mg | ORAL_TABLET | Freq: Every day | ORAL | 1 refills | Status: DC

## 2021-01-12 NOTE — Assessment & Plan Note (Signed)
Last vitamin D level was normal.

## 2021-01-12 NOTE — Assessment & Plan Note (Signed)
Headache probably due to High blood pressure.  HTN medications refilled. Use OTC tylenol as needed.

## 2021-01-12 NOTE — Progress Notes (Signed)
New Patient Office Visit  Subjective:  Patient ID: Ana [name], female    DOB: 06/23/1970  Age: 50 y.o. MRN: [id number]  CC:  Chief Complaint  Patient presents with   New Patient (Initial Visit)    New pt. Previous PCP Dr. Anastasio Champion. Needs refill on all medications. Has been out of all meds x 2 weeks    HPI Ana [name] presents to establish care. She was accompanied by her daughter today. Past medical history and medications reviewed. PT stated that she ran out of all her medications two weeks ago.   Pt c/o headache 4/10. She stated that her headache started this morning. She denies fever, chills, cough, dizziness , chest pain, fainting spell, palpitations   CKD; she is sees Dr. Denice Bors (nephrologist.) Last seen by MD in August, Labs stable per note written by MD and adjustments made to patients medications at that visit.  HTN: Condition not well controled. Pt out of her blood pressure pills. Medications managed by Nephrologist. Medications refilled today. Pt sees Dr Roos(Cardiology) next appointment is on dece 13  BP Readings from Last 3 Encounters:  01/12/21 (!) 214/102  10/16/20 (!) 150/78  10/06/20 (!) 180/80    DM Not well controlled.  Takes glipizide, 10 mg. Referral made to endo for management.  Lab Results  Component Value Date   HGBA1C 10.5 (H) 09/02/2020    HLD; Currently not on medication for this condition. Will recheck lipid level next visit   Legally blind. She stated that she  had an her eye exam at [facility] this year. Will have records faxed to [place]  Mammogram ordered today, will order colonoscopy at next visit.        Past Medical History:  Diagnosis Date   CKD (chronic kidney disease)    Diabetes mellitus    diet controlled   Hypertension     Past Surgical History:  Procedure Laterality Date   CESAREAN SECTION  x4   CHOLECYSTECTOMY     EYE SURGERY     MASS EXCISION  10/27/2011   Procedure: EXCISION MASS;  Surgeon: Donato Heinz, MD;  Location: [facility];  Service: General;  Laterality: Right;   TUBAL LIGATION      Family History  Problem Relation Age of Onset   Kidney failure Mother    Diabetes Mother    Kidney disease Mother    Kidney failure Father    Kidney disease Father    Kidney failure Brother    Diabetes Brother    Diabetes Brother     Social History   Socioeconomic History   Marital status: Single    Spouse name: Not on file   Number of children: Not on file   Years of education: Not on file   Highest education level: Not on file  Occupational History   Not on file  Tobacco Use   Smoking status: Never   Smokeless tobacco: Never  Vaping Use   Vaping Use: Never used  Substance and Sexual Activity   Alcohol use: Not Currently    Comment: occ   Drug use: No   Sexual activity: Yes    Birth control/protection: Surgical  Other Topics Concern   Not on file  Social History Narrative   Divorced,married for 8 years.Lives with daughter and 3 grandkids.Unemployed.   Social Determinants of Health   Financial Resource Strain: Not on file  Food Insecurity: Not on file  Transportation Needs: Not on file  Physical Activity:  Not on file  Stress: Not on file  Social Connections: Not on file  Intimate Partner Violence: Not on file    ROS Review of Systems  Constitutional:  Negative for activity change, appetite change, chills, diaphoresis, fatigue, fever and unexpected weight change.  HENT:  Negative for congestion, dental problem, drooling, ear discharge, ear pain, facial swelling and hearing loss.   Eyes:  Positive for visual disturbance. Negative for pain, discharge, redness and itching.       Legally blind sees shadows.   Respiratory:  Negative for apnea, cough, chest tightness, shortness of breath and wheezing.   Cardiovascular:  Negative for chest pain, palpitations and leg swelling.  Gastrointestinal:  Negative for abdominal distention, abdominal pain, blood in stool,  constipation and diarrhea.  Endocrine: Negative for cold intolerance and heat intolerance.  Genitourinary:  Negative for difficulty urinating, dyspareunia, dysuria, enuresis, flank pain and frequency.  Musculoskeletal:  Negative for arthralgias, back pain, gait problem, joint swelling, myalgias, neck pain and neck stiffness.  Skin:  Negative for color change, pallor, rash and wound.  Allergic/Immunologic: Negative for environmental allergies, food allergies and immunocompromised state.  Neurological:  Positive for headaches. Negative for dizziness, syncope, facial asymmetry, weakness, light-headedness and numbness.  Psychiatric/Behavioral:  Negative for agitation, behavioral problems, confusion, self-injury, sleep disturbance and suicidal ideas. The patient is not nervous/anxious.    Objective:   Today's Vitals: BP (!) 214/102 (BP Location: Left Arm, Patient Position: Sitting, Cuff Size: Normal)   Pulse 82   Ht 5' (1.524 m)   Wt 174 lb (78.9 kg)   LMP 09/22/2019 (Approximate)   SpO2 97%   BMI 33.98 kg/m   Physical Exam Constitutional:      General: She is not in acute distress.    Appearance: She is obese. She is not ill-appearing or diaphoretic.  HENT:     Head: Atraumatic.     Left Ear: External ear normal.     Nose: Nose normal. No congestion or rhinorrhea.     Mouth/Throat:     Mouth: Mucous membranes are moist.     Pharynx: Oropharynx is clear.  Eyes:     General:        Right eye: No discharge.        Left eye: No discharge.     Conjunctiva/sclera: Conjunctivae normal.  Cardiovascular:     Rate and Rhythm: Normal rate and regular rhythm.     Pulses: Normal pulses.     Heart sounds: Normal heart sounds. No murmur heard.   No friction rub. No gallop.  Pulmonary:     Effort: No respiratory distress.     Breath sounds: No stridor. No wheezing, rhonchi or rales.  Chest:     Chest wall: No tenderness.  Abdominal:     Palpations: Abdomen is soft.     Tenderness: There  is no abdominal tenderness. There is no guarding or rebound.  Musculoskeletal:        General: No swelling or tenderness.     Cervical back: Normal range of motion. No rigidity or tenderness.     Right lower leg: No edema.     Left lower leg: No edema.  Skin:    General: Skin is warm and dry.     Capillary Refill: Capillary refill takes less than 2 seconds.     Coloration: Skin is not jaundiced or pale.     Findings: No bruising or erythema.  Neurological:     Mental Status: She is alert.  Motor: No weakness.  Psychiatric:        Mood and Affect: Mood normal.        Thought Content: Thought content normal.        Judgment: Judgment normal.    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   Accu-Chek Softclix Lancets lancets daily.   blood glucose meter kit and supplies KIT Dispense based on patient and insurance preference. Use daily to check your blood sugar once per day.   Cholecalciferol 25 MCG (1000 UT) capsule Take by mouth.   sodium bicarbonate 650 MG tablet Take 650 mg by mouth 4 (four) times daily.   [DISCONTINUED] amLODipine (NORVASC) 10 MG tablet TAKE 1 TABLET(10 MG) BY MOUTH DAILY   [DISCONTINUED] carvedilol (COREG) 12.5 MG tablet Take 1 tablet (12.5 mg total) by mouth 2 (two) times daily.   [DISCONTINUED] furosemide (LASIX) 20 MG tablet Take 20 mg by mouth daily.   [DISCONTINUED] glipiZIDE (GLUCOTROL) 10 MG tablet TAKE 1 TABLET(10 MG) BY MOUTH TWICE DAILY BEFORE A MEAL    [DISCONTINUED] levothyroxine (SYNTHROID) 50 MCG tablet TAKE 1 TABLET(50 MCG) BY MOUTH DAILY   [DISCONTINUED] losartan (COZAAR) 100 MG tablet Take 1 tablet (100 mg total) by mouth daily.   amLODipine (NORVASC) 10 MG tablet TAKE 1 TABLET(10 MG) BY MOUTH DAILY   Blood Pressure Monitoring (SPHYGMOMANOMETER) MISC 1 each by Does not apply route daily. (Patient not taking: Reported on 01/12/2021)   carvedilol (COREG) 12.5 MG tablet Take 1 tablet (12.5 mg total) by mouth 2 (two) times daily.   furosemide (LASIX) 20 MG tablet Take 1 tablet (20 mg total) by mouth daily.   glipiZIDE (GLUCOTROL) 10 MG tablet TAKE 1 TABLET(10 MG) BY MOUTH TWICE DAILY BEFORE A MEAL   levothyroxine (SYNTHROID) 50 MCG tablet TAKE 1 TABLET(50 MCG) BY MOUTH DAILY   losartan (COZAAR) 100 MG tablet Take 1 tablet (100 mg total) by mouth daily.   [DISCONTINUED] diclofenac Sodium (VOLTAREN) 1 % GEL Apply 4 g topically 4 (four) times daily.   [DISCONTINUED] furosemide (LASIX) 40 MG tablet Take 1 tablet (40 mg total) by mouth daily.   No facility-administered encounter medications on file as of 01/12/2021.    Follow-up: Return in about 1 week (around 01/19/2021).   Renee Rival, FNP

## 2021-01-12 NOTE — Patient Instructions (Signed)
Your medications have been refilled. Schedule your mammogram at check out.  Please schedule an appointment with the nephrologist as soon as possible.  Follow up in one week to recheck your blood pressure.   Maintain a healthy weight.   Referral made to endocrinologist for your diabetes.   Avoid foods that contain a lot of saturated and trans fat such as red meat, butter, fried foods and cheese.  Eat a healthy diet,including lots of fruits and vegetables.  Eat a heart healthy diet with less salt, added sugar and fat.  Avoid sugar, juice  cakes, white bread , soda and other food containing sugar

## 2021-01-12 NOTE — Assessment & Plan Note (Signed)
Medication refilled. Continue current medication ,  Lab Results  Component Value Date   TSH 3.24 09/02/2020

## 2021-01-12 NOTE — Assessment & Plan Note (Signed)
Condition not well controlled, Takes glipizide 10 mg Importance of low carb diet and exercise discussed with pt and her daughter. Referral made to endo.  Lab Results  Component Value Date   HGBA1C 10.5 (H) 09/02/2020

## 2021-01-12 NOTE — Assessment & Plan Note (Signed)
Currently not on any medication. Will order lipid panel next visit.  Importance of low fat heart healthy diet discussed with patient.

## 2021-01-12 NOTE — Assessment & Plan Note (Addendum)
Condition not well controlled. Pt ran out of her medications 2 weeks ago.I discussed the need for patient to tae her medications as prescribed, call the doctors office here or her nephrologist office  before her medications run ou. I discussed  The complications that could arise from not taking her medications. PT encouraged to schedule an appointment to see the nephrologist for follow up as soon as possible for med management.  Medications refilled today.  Follow up in one week to recheck BP.  BP Readings from Last 3 Encounters:  01/12/21 (!) 214/102  10/16/20 (!) 150/78  10/06/20 (!) 180/80

## 2021-01-13 DIAGNOSIS — E1165 Type 2 diabetes mellitus with hyperglycemia: Secondary | ICD-10-CM | POA: Diagnosis not present

## 2021-01-13 NOTE — Addendum Note (Signed)
Addended by: Renee Rival on: 01/13/2021 09:07 PM   Modules accepted: Level of Service

## 2021-01-14 DIAGNOSIS — E1165 Type 2 diabetes mellitus with hyperglycemia: Secondary | ICD-10-CM | POA: Diagnosis not present

## 2021-01-15 DIAGNOSIS — E1165 Type 2 diabetes mellitus with hyperglycemia: Secondary | ICD-10-CM | POA: Diagnosis not present

## 2021-01-16 DIAGNOSIS — E1165 Type 2 diabetes mellitus with hyperglycemia: Secondary | ICD-10-CM | POA: Diagnosis not present

## 2021-01-17 DIAGNOSIS — E1165 Type 2 diabetes mellitus with hyperglycemia: Secondary | ICD-10-CM | POA: Diagnosis not present

## 2021-01-18 DIAGNOSIS — E1165 Type 2 diabetes mellitus with hyperglycemia: Secondary | ICD-10-CM | POA: Diagnosis not present

## 2021-01-19 DIAGNOSIS — E1165 Type 2 diabetes mellitus with hyperglycemia: Secondary | ICD-10-CM | POA: Diagnosis not present

## 2021-01-20 DIAGNOSIS — E1165 Type 2 diabetes mellitus with hyperglycemia: Secondary | ICD-10-CM | POA: Diagnosis not present

## 2021-01-21 ENCOUNTER — Ambulatory Visit: Payer: Medicaid Other | Admitting: Nurse Practitioner

## 2021-01-21 DIAGNOSIS — E1165 Type 2 diabetes mellitus with hyperglycemia: Secondary | ICD-10-CM | POA: Diagnosis not present

## 2021-01-22 DIAGNOSIS — E1165 Type 2 diabetes mellitus with hyperglycemia: Secondary | ICD-10-CM | POA: Diagnosis not present

## 2021-01-23 DIAGNOSIS — E1165 Type 2 diabetes mellitus with hyperglycemia: Secondary | ICD-10-CM | POA: Diagnosis not present

## 2021-01-24 ENCOUNTER — Ambulatory Visit: Payer: Medicaid Other | Admitting: Nurse Practitioner

## 2021-01-24 ENCOUNTER — Encounter: Payer: Self-pay | Admitting: Nurse Practitioner

## 2021-01-24 ENCOUNTER — Other Ambulatory Visit: Payer: Self-pay

## 2021-01-24 VITALS — BP 171/76 | HR 87 | Ht 60.0 in | Wt 180.0 lb

## 2021-01-24 DIAGNOSIS — E782 Mixed hyperlipidemia: Secondary | ICD-10-CM | POA: Diagnosis not present

## 2021-01-24 DIAGNOSIS — E1165 Type 2 diabetes mellitus with hyperglycemia: Secondary | ICD-10-CM | POA: Diagnosis not present

## 2021-01-24 DIAGNOSIS — I10 Essential (primary) hypertension: Secondary | ICD-10-CM | POA: Diagnosis not present

## 2021-01-24 DIAGNOSIS — Z6837 Body mass index (BMI) 37.0-37.9, adult: Secondary | ICD-10-CM

## 2021-01-24 MED ORDER — HYDRALAZINE HCL 50 MG PO TABS: 50.0000 mg | ORAL_TABLET | Freq: Two times a day (BID) | ORAL | 3 refills | Status: DC

## 2021-01-24 NOTE — Assessment & Plan Note (Addendum)
Has upcoming apt with Dr. Dorris Fetch Continue current med, importance of diet and exercise discussed with PT.  Foot exam done today, no neuropathy.

## 2021-01-24 NOTE — Assessment & Plan Note (Signed)
PT does not want lab done today, she stated that she will have lab done during her next visit with the nephrologist. Importance of diet and exercise discussed.

## 2021-01-24 NOTE — Assessment & Plan Note (Signed)
Importance of diet and exercise discussed.

## 2021-01-24 NOTE — Progress Notes (Signed)
   Ana [name]     MRN: [id number]      DOB: 06-21-1970   HPI Ana [name] is here for follow up and re-evaluation of chronic medical conditions, medication management and review of any available recent lab and radiology data.  The PT denies any adverse reactions to current medications since the last visit.  There are no new concerns.  There are no specific complaints    BP is still elevated today, she stated that she has been taking her medications. PT has not been checking BP at home, she has not picked up her BP cuff.   Last lipid check was in Feb, PT does not want to be stuck today , she will get her lipid panel with her next labs at the nephrologist office. She is currently not on anti lipidemia. medication.   ROS Denies recent fever or chills. Denies sinus pressure, nasal congestion, ear pain or sore throat. Denies chest congestion, productive cough or wheezing. Denies chest pains, palpitations and leg swelling Denies abdominal pain, nausea, vomiting,diarrhea or constipation.   Denies dysuria, frequency, hesitancy or incontinence. Denies joint pain, swelling and limitation in mobility. Denies headaches, seizures, numbness, or tingling. Denies depression, anxiety or insomnia. Denies skin break down or rash.   PE  BP (!) 171/76 (BP Location: Left Arm, Patient Position: Sitting, Cuff Size: Normal)   Pulse 87   Ht 5' (1.524 m)   Wt 180 lb (81.6 kg)   LMP 09/22/2019 (Approximate)   SpO2 96%   BMI 35.15 kg/m   Patient alert and oriented and in no cardiopulmonary distress.  HEENT: No facial asymmetry, EOMI,     Neck supple .  Chest: Clear to auscultation bilaterally.  CVS: S1, S2 no murmurs, no S3.Regular rate.  ABD: Soft non tender.   Ext: bilateral lower extremity non pitting edema noted.   MS: Adequate ROM spine, shoulders, hips and knees.  Skin: Intact, no ulcerations or rash noted.  Psych: Good eye contact, normal affect. Memory intact not anxious or  depressed appearing.  CNS: power,  normal throughout.no focal deficits noted.   Assessment & Plan

## 2021-01-24 NOTE — Assessment & Plan Note (Signed)
BP Readings from Last 3 Encounters:  01/24/21 (!) 171/76  01/12/21 (!) 214/102  10/16/20 (!) 150/78  Start Hydralazine 50mg  BID. Has an upcoming appointment with nephrologist in Dec per patient.

## 2021-01-24 NOTE — Patient Instructions (Signed)
Take hydralazine 50mg  one in the morning and one in the evening for blood pressure.Please take all your medications as scheduled.   Get your cholesterol level checked with your next labs. Please keep all your upcoming appointment    It is important that you exercise regularly at least 30 minutes 5 times a week.  Think about what you will eat, plan ahead. Choose " clean, green, fresh or frozen" over canned, processed or packaged foods which are more sugary, salty and fatty. 70 to 75% of food eaten should be vegetables and fruit. Three meals at set times with snacks allowed between meals, but they must be fruit or vegetables. Aim to eat over a 12 hour period , example 7 am to 7 pm, and STOP after  your last meal of the day. Drink water,generally about 64 ounces per day, no other drink is as healthy. Fruit juice is best enjoyed in a healthy way, by EATING the fruit.  Thanks for choosing [facility], we consider it a privelige to serve you.

## 2021-01-25 DIAGNOSIS — E1165 Type 2 diabetes mellitus with hyperglycemia: Secondary | ICD-10-CM | POA: Diagnosis not present

## 2021-01-26 DIAGNOSIS — E1165 Type 2 diabetes mellitus with hyperglycemia: Secondary | ICD-10-CM | POA: Diagnosis not present

## 2021-01-27 DIAGNOSIS — E1165 Type 2 diabetes mellitus with hyperglycemia: Secondary | ICD-10-CM | POA: Diagnosis not present

## 2021-01-28 DIAGNOSIS — E1165 Type 2 diabetes mellitus with hyperglycemia: Secondary | ICD-10-CM | POA: Diagnosis not present

## 2021-01-29 DIAGNOSIS — E1165 Type 2 diabetes mellitus with hyperglycemia: Secondary | ICD-10-CM | POA: Diagnosis not present

## 2021-01-30 DIAGNOSIS — E1165 Type 2 diabetes mellitus with hyperglycemia: Secondary | ICD-10-CM | POA: Diagnosis not present

## 2021-01-31 DIAGNOSIS — E1165 Type 2 diabetes mellitus with hyperglycemia: Secondary | ICD-10-CM | POA: Diagnosis not present

## 2021-02-01 DIAGNOSIS — E1165 Type 2 diabetes mellitus with hyperglycemia: Secondary | ICD-10-CM | POA: Diagnosis not present

## 2021-02-02 DIAGNOSIS — E1165 Type 2 diabetes mellitus with hyperglycemia: Secondary | ICD-10-CM | POA: Diagnosis not present

## 2021-02-03 DIAGNOSIS — E1165 Type 2 diabetes mellitus with hyperglycemia: Secondary | ICD-10-CM | POA: Diagnosis not present

## 2021-02-08 ENCOUNTER — Ambulatory Visit: Payer: Medicaid Other | Admitting: Internal Medicine

## 2021-02-14 ENCOUNTER — Ambulatory Visit: Payer: Self-pay | Admitting: "Endocrinology

## 2021-02-16 DIAGNOSIS — E1165 Type 2 diabetes mellitus with hyperglycemia: Secondary | ICD-10-CM | POA: Diagnosis not present

## 2021-02-17 DIAGNOSIS — E1165 Type 2 diabetes mellitus with hyperglycemia: Secondary | ICD-10-CM | POA: Diagnosis not present

## 2021-02-18 DIAGNOSIS — E1165 Type 2 diabetes mellitus with hyperglycemia: Secondary | ICD-10-CM | POA: Diagnosis not present

## 2021-02-19 DIAGNOSIS — E1165 Type 2 diabetes mellitus with hyperglycemia: Secondary | ICD-10-CM | POA: Diagnosis not present

## 2021-02-20 DIAGNOSIS — E1165 Type 2 diabetes mellitus with hyperglycemia: Secondary | ICD-10-CM | POA: Diagnosis not present

## 2021-02-21 DIAGNOSIS — E1165 Type 2 diabetes mellitus with hyperglycemia: Secondary | ICD-10-CM | POA: Diagnosis not present

## 2021-02-22 DIAGNOSIS — E1165 Type 2 diabetes mellitus with hyperglycemia: Secondary | ICD-10-CM | POA: Diagnosis not present

## 2021-02-23 DIAGNOSIS — E1165 Type 2 diabetes mellitus with hyperglycemia: Secondary | ICD-10-CM | POA: Diagnosis not present

## 2021-02-24 DIAGNOSIS — E1165 Type 2 diabetes mellitus with hyperglycemia: Secondary | ICD-10-CM | POA: Diagnosis not present

## 2021-02-25 DIAGNOSIS — E1165 Type 2 diabetes mellitus with hyperglycemia: Secondary | ICD-10-CM | POA: Diagnosis not present

## 2021-02-26 DIAGNOSIS — E1165 Type 2 diabetes mellitus with hyperglycemia: Secondary | ICD-10-CM | POA: Diagnosis not present

## 2021-02-27 DIAGNOSIS — E1165 Type 2 diabetes mellitus with hyperglycemia: Secondary | ICD-10-CM | POA: Diagnosis not present

## 2021-02-28 ENCOUNTER — Other Ambulatory Visit: Payer: Self-pay | Admitting: Nurse Practitioner

## 2021-02-28 DIAGNOSIS — E1165 Type 2 diabetes mellitus with hyperglycemia: Secondary | ICD-10-CM | POA: Diagnosis not present

## 2021-03-01 DIAGNOSIS — E1165 Type 2 diabetes mellitus with hyperglycemia: Secondary | ICD-10-CM | POA: Diagnosis not present

## 2021-03-02 DIAGNOSIS — E1165 Type 2 diabetes mellitus with hyperglycemia: Secondary | ICD-10-CM | POA: Diagnosis not present

## 2021-03-03 ENCOUNTER — Ambulatory Visit (HOSPITAL_COMMUNITY): Payer: Medicaid Other

## 2021-03-03 DIAGNOSIS — E1165 Type 2 diabetes mellitus with hyperglycemia: Secondary | ICD-10-CM | POA: Diagnosis not present

## 2021-03-04 ENCOUNTER — Ambulatory Visit (HOSPITAL_COMMUNITY)
Admission: RE | Admit: 2021-03-04 | Discharge: 2021-03-04 | Disposition: A | Discharge status: Home / Self Care | Payer: Medicaid Other | Source: Ambulatory Visit | Attending: Nurse Practitioner | Admitting: Nurse Practitioner

## 2021-03-04 ENCOUNTER — Other Ambulatory Visit: Payer: Self-pay

## 2021-03-04 DIAGNOSIS — Z1231 Encounter for screening mammogram for malignant neoplasm of breast: Secondary | ICD-10-CM

## 2021-03-04 DIAGNOSIS — E1165 Type 2 diabetes mellitus with hyperglycemia: Secondary | ICD-10-CM | POA: Diagnosis not present

## 2021-03-05 DIAGNOSIS — E1165 Type 2 diabetes mellitus with hyperglycemia: Secondary | ICD-10-CM | POA: Diagnosis not present

## 2021-03-06 DIAGNOSIS — E1165 Type 2 diabetes mellitus with hyperglycemia: Secondary | ICD-10-CM | POA: Diagnosis not present

## 2021-03-07 DIAGNOSIS — E1165 Type 2 diabetes mellitus with hyperglycemia: Secondary | ICD-10-CM | POA: Diagnosis not present

## 2021-03-07 LAB — COMPLETE METABOLIC PANEL WITH GFR
AG Ratio: 1.1 (calc) (ref 1.0–2.5)
ALT: 13 U/L (ref 6–29)
AST: 14 U/L (ref 10–35)
Albumin: 3.5 g/dL — ABNORMAL LOW (ref 3.6–5.1)
Alkaline phosphatase (APISO): 135 U/L (ref 37–153)
BUN/Creatinine Ratio: 14 (calc) (ref 6–22)
BUN: 30 mg/dL — ABNORMAL HIGH (ref 7–25)
CO2: 26 mmol/L (ref 20–32)
Calcium: 9.1 mg/dL (ref 8.6–10.4)
Chloride: 104 mmol/L (ref 98–110)
Creat: 2.18 mg/dL — ABNORMAL HIGH (ref 0.50–1.03)
Globulin: 3.2 g/dL (calc) (ref 1.9–3.7)
Glucose, Bld: 150 mg/dL — ABNORMAL HIGH (ref 65–99)
Potassium: 4.5 mmol/L (ref 3.5–5.3)
Sodium: 138 mmol/L (ref 135–146)
Total Bilirubin: 0.4 mg/dL (ref 0.2–1.2)
Total Protein: 6.7 g/dL (ref 6.1–8.1)
eGFR: 27 mL/min/{1.73_m2} — ABNORMAL LOW (ref >=60)

## 2021-03-07 LAB — TEST AUTHORIZATION

## 2021-03-07 LAB — TSH

## 2021-03-08 DIAGNOSIS — E1165 Type 2 diabetes mellitus with hyperglycemia: Secondary | ICD-10-CM | POA: Diagnosis not present

## 2021-03-09 DIAGNOSIS — E1165 Type 2 diabetes mellitus with hyperglycemia: Secondary | ICD-10-CM | POA: Diagnosis not present

## 2021-03-10 DIAGNOSIS — E1165 Type 2 diabetes mellitus with hyperglycemia: Secondary | ICD-10-CM | POA: Diagnosis not present

## 2021-03-11 ENCOUNTER — Other Ambulatory Visit: Payer: Self-pay | Admitting: Nurse Practitioner

## 2021-03-11 DIAGNOSIS — E1165 Type 2 diabetes mellitus with hyperglycemia: Secondary | ICD-10-CM | POA: Diagnosis not present

## 2021-03-12 DIAGNOSIS — E1165 Type 2 diabetes mellitus with hyperglycemia: Secondary | ICD-10-CM | POA: Diagnosis not present

## 2021-03-13 ENCOUNTER — Other Ambulatory Visit: Payer: Self-pay | Admitting: Nurse Practitioner

## 2021-03-13 DIAGNOSIS — E1165 Type 2 diabetes mellitus with hyperglycemia: Secondary | ICD-10-CM | POA: Diagnosis not present

## 2021-03-14 ENCOUNTER — Other Ambulatory Visit: Payer: Self-pay | Admitting: Nurse Practitioner

## 2021-03-14 DIAGNOSIS — E1165 Type 2 diabetes mellitus with hyperglycemia: Secondary | ICD-10-CM | POA: Diagnosis not present

## 2021-03-14 MED ORDER — LEVOTHYROXINE SODIUM 50 MCG PO TABS: ORAL_TABLET | ORAL | 3 refills | Status: DC

## 2021-03-15 ENCOUNTER — Other Ambulatory Visit: Payer: Self-pay | Admitting: Nurse Practitioner

## 2021-03-15 DIAGNOSIS — E1165 Type 2 diabetes mellitus with hyperglycemia: Secondary | ICD-10-CM | POA: Diagnosis not present

## 2021-03-16 DIAGNOSIS — E1165 Type 2 diabetes mellitus with hyperglycemia: Secondary | ICD-10-CM | POA: Diagnosis not present

## 2021-03-17 DIAGNOSIS — E1165 Type 2 diabetes mellitus with hyperglycemia: Secondary | ICD-10-CM | POA: Diagnosis not present

## 2021-03-18 DIAGNOSIS — E1165 Type 2 diabetes mellitus with hyperglycemia: Secondary | ICD-10-CM | POA: Diagnosis not present

## 2021-03-19 DIAGNOSIS — E1165 Type 2 diabetes mellitus with hyperglycemia: Secondary | ICD-10-CM | POA: Diagnosis not present

## 2021-03-20 DIAGNOSIS — E1165 Type 2 diabetes mellitus with hyperglycemia: Secondary | ICD-10-CM | POA: Diagnosis not present

## 2021-03-21 DIAGNOSIS — E1165 Type 2 diabetes mellitus with hyperglycemia: Secondary | ICD-10-CM | POA: Diagnosis not present

## 2021-03-22 DIAGNOSIS — E1165 Type 2 diabetes mellitus with hyperglycemia: Secondary | ICD-10-CM | POA: Diagnosis not present

## 2021-03-23 DIAGNOSIS — E1165 Type 2 diabetes mellitus with hyperglycemia: Secondary | ICD-10-CM | POA: Diagnosis not present

## 2021-03-24 DIAGNOSIS — E1165 Type 2 diabetes mellitus with hyperglycemia: Secondary | ICD-10-CM | POA: Diagnosis not present

## 2021-03-25 DIAGNOSIS — E1165 Type 2 diabetes mellitus with hyperglycemia: Secondary | ICD-10-CM | POA: Diagnosis not present

## 2021-03-26 DIAGNOSIS — E1165 Type 2 diabetes mellitus with hyperglycemia: Secondary | ICD-10-CM | POA: Diagnosis not present

## 2021-03-27 DIAGNOSIS — E1165 Type 2 diabetes mellitus with hyperglycemia: Secondary | ICD-10-CM | POA: Diagnosis not present

## 2021-03-28 DIAGNOSIS — E1165 Type 2 diabetes mellitus with hyperglycemia: Secondary | ICD-10-CM | POA: Diagnosis not present

## 2021-03-29 DIAGNOSIS — E1165 Type 2 diabetes mellitus with hyperglycemia: Secondary | ICD-10-CM | POA: Diagnosis not present

## 2021-03-30 DIAGNOSIS — E1165 Type 2 diabetes mellitus with hyperglycemia: Secondary | ICD-10-CM | POA: Diagnosis not present

## 2021-03-31 DIAGNOSIS — E1165 Type 2 diabetes mellitus with hyperglycemia: Secondary | ICD-10-CM | POA: Diagnosis not present

## 2021-04-01 DIAGNOSIS — E1165 Type 2 diabetes mellitus with hyperglycemia: Secondary | ICD-10-CM | POA: Diagnosis not present

## 2021-04-02 ENCOUNTER — Other Ambulatory Visit: Payer: Self-pay | Admitting: Nurse Practitioner

## 2021-04-02 DIAGNOSIS — E1165 Type 2 diabetes mellitus with hyperglycemia: Secondary | ICD-10-CM | POA: Diagnosis not present

## 2021-04-03 DIAGNOSIS — E1165 Type 2 diabetes mellitus with hyperglycemia: Secondary | ICD-10-CM | POA: Diagnosis not present

## 2021-04-04 DIAGNOSIS — E1165 Type 2 diabetes mellitus with hyperglycemia: Secondary | ICD-10-CM | POA: Diagnosis not present

## 2021-04-05 DIAGNOSIS — E1165 Type 2 diabetes mellitus with hyperglycemia: Secondary | ICD-10-CM | POA: Diagnosis not present

## 2021-04-06 DIAGNOSIS — E1165 Type 2 diabetes mellitus with hyperglycemia: Secondary | ICD-10-CM | POA: Diagnosis not present

## 2021-04-07 DIAGNOSIS — E1165 Type 2 diabetes mellitus with hyperglycemia: Secondary | ICD-10-CM | POA: Diagnosis not present

## 2021-04-08 DIAGNOSIS — E1165 Type 2 diabetes mellitus with hyperglycemia: Secondary | ICD-10-CM | POA: Diagnosis not present

## 2021-04-09 DIAGNOSIS — E1165 Type 2 diabetes mellitus with hyperglycemia: Secondary | ICD-10-CM | POA: Diagnosis not present

## 2021-04-10 DIAGNOSIS — E1165 Type 2 diabetes mellitus with hyperglycemia: Secondary | ICD-10-CM | POA: Diagnosis not present

## 2021-04-11 DIAGNOSIS — E1165 Type 2 diabetes mellitus with hyperglycemia: Secondary | ICD-10-CM | POA: Diagnosis not present

## 2021-04-12 DIAGNOSIS — E1165 Type 2 diabetes mellitus with hyperglycemia: Secondary | ICD-10-CM | POA: Diagnosis not present

## 2021-04-13 DIAGNOSIS — E1165 Type 2 diabetes mellitus with hyperglycemia: Secondary | ICD-10-CM | POA: Diagnosis not present

## 2021-04-14 DIAGNOSIS — E1165 Type 2 diabetes mellitus with hyperglycemia: Secondary | ICD-10-CM | POA: Diagnosis not present

## 2021-04-15 ENCOUNTER — Ambulatory Visit: Payer: Medicaid Other | Admitting: Internal Medicine

## 2021-04-15 DIAGNOSIS — E1165 Type 2 diabetes mellitus with hyperglycemia: Secondary | ICD-10-CM | POA: Diagnosis not present

## 2021-04-16 ENCOUNTER — Other Ambulatory Visit: Payer: Self-pay | Admitting: Nurse Practitioner

## 2021-04-16 DIAGNOSIS — E1165 Type 2 diabetes mellitus with hyperglycemia: Secondary | ICD-10-CM | POA: Diagnosis not present

## 2021-04-17 DIAGNOSIS — E1165 Type 2 diabetes mellitus with hyperglycemia: Secondary | ICD-10-CM | POA: Diagnosis not present

## 2021-04-18 ENCOUNTER — Other Ambulatory Visit: Payer: Self-pay | Admitting: Nurse Practitioner

## 2021-04-18 DIAGNOSIS — E1165 Type 2 diabetes mellitus with hyperglycemia: Secondary | ICD-10-CM | POA: Diagnosis not present

## 2021-04-18 MED ORDER — LOSARTAN POTASSIUM 100 MG PO TABS: ORAL_TABLET | ORAL | 0 refills | Status: DC

## 2021-04-19 DIAGNOSIS — E1165 Type 2 diabetes mellitus with hyperglycemia: Secondary | ICD-10-CM | POA: Diagnosis not present

## 2021-04-20 DIAGNOSIS — E1165 Type 2 diabetes mellitus with hyperglycemia: Secondary | ICD-10-CM | POA: Diagnosis not present

## 2021-04-21 DIAGNOSIS — E1165 Type 2 diabetes mellitus with hyperglycemia: Secondary | ICD-10-CM | POA: Diagnosis not present

## 2021-04-22 DIAGNOSIS — E1165 Type 2 diabetes mellitus with hyperglycemia: Secondary | ICD-10-CM | POA: Diagnosis not present

## 2021-04-23 DIAGNOSIS — E1165 Type 2 diabetes mellitus with hyperglycemia: Secondary | ICD-10-CM | POA: Diagnosis not present

## 2021-04-24 DIAGNOSIS — E1165 Type 2 diabetes mellitus with hyperglycemia: Secondary | ICD-10-CM | POA: Diagnosis not present

## 2021-04-25 DIAGNOSIS — E1165 Type 2 diabetes mellitus with hyperglycemia: Secondary | ICD-10-CM | POA: Diagnosis not present

## 2021-04-26 ENCOUNTER — Ambulatory Visit: Payer: Medicaid Other | Admitting: Nurse Practitioner

## 2021-04-26 DIAGNOSIS — E1165 Type 2 diabetes mellitus with hyperglycemia: Secondary | ICD-10-CM | POA: Diagnosis not present

## 2021-04-27 DIAGNOSIS — E1165 Type 2 diabetes mellitus with hyperglycemia: Secondary | ICD-10-CM | POA: Diagnosis not present

## 2021-04-28 DIAGNOSIS — E1165 Type 2 diabetes mellitus with hyperglycemia: Secondary | ICD-10-CM | POA: Diagnosis not present

## 2021-04-29 DIAGNOSIS — E1165 Type 2 diabetes mellitus with hyperglycemia: Secondary | ICD-10-CM | POA: Diagnosis not present

## 2021-04-30 DIAGNOSIS — E1165 Type 2 diabetes mellitus with hyperglycemia: Secondary | ICD-10-CM | POA: Diagnosis not present

## 2021-05-01 DIAGNOSIS — E1165 Type 2 diabetes mellitus with hyperglycemia: Secondary | ICD-10-CM | POA: Diagnosis not present

## 2021-05-02 DIAGNOSIS — E1165 Type 2 diabetes mellitus with hyperglycemia: Secondary | ICD-10-CM | POA: Diagnosis not present

## 2021-05-03 DIAGNOSIS — E1165 Type 2 diabetes mellitus with hyperglycemia: Secondary | ICD-10-CM | POA: Diagnosis not present

## 2021-05-04 DIAGNOSIS — E1165 Type 2 diabetes mellitus with hyperglycemia: Secondary | ICD-10-CM | POA: Diagnosis not present

## 2021-05-05 DIAGNOSIS — E1165 Type 2 diabetes mellitus with hyperglycemia: Secondary | ICD-10-CM | POA: Diagnosis not present

## 2021-05-06 DIAGNOSIS — E1165 Type 2 diabetes mellitus with hyperglycemia: Secondary | ICD-10-CM | POA: Diagnosis not present

## 2021-05-07 DIAGNOSIS — E1165 Type 2 diabetes mellitus with hyperglycemia: Secondary | ICD-10-CM | POA: Diagnosis not present

## 2021-05-08 DIAGNOSIS — E1165 Type 2 diabetes mellitus with hyperglycemia: Secondary | ICD-10-CM | POA: Diagnosis not present

## 2021-05-09 DIAGNOSIS — E1165 Type 2 diabetes mellitus with hyperglycemia: Secondary | ICD-10-CM | POA: Diagnosis not present

## 2021-05-10 DIAGNOSIS — E1165 Type 2 diabetes mellitus with hyperglycemia: Secondary | ICD-10-CM | POA: Diagnosis not present

## 2021-05-11 DIAGNOSIS — E1165 Type 2 diabetes mellitus with hyperglycemia: Secondary | ICD-10-CM | POA: Diagnosis not present

## 2021-05-12 DIAGNOSIS — E1165 Type 2 diabetes mellitus with hyperglycemia: Secondary | ICD-10-CM | POA: Diagnosis not present

## 2021-05-13 DIAGNOSIS — E1165 Type 2 diabetes mellitus with hyperglycemia: Secondary | ICD-10-CM | POA: Diagnosis not present

## 2021-05-14 DIAGNOSIS — E1165 Type 2 diabetes mellitus with hyperglycemia: Secondary | ICD-10-CM | POA: Diagnosis not present

## 2021-05-15 DIAGNOSIS — E1165 Type 2 diabetes mellitus with hyperglycemia: Secondary | ICD-10-CM | POA: Diagnosis not present

## 2021-05-16 DIAGNOSIS — E1165 Type 2 diabetes mellitus with hyperglycemia: Secondary | ICD-10-CM | POA: Diagnosis not present

## 2021-05-17 DIAGNOSIS — E1165 Type 2 diabetes mellitus with hyperglycemia: Secondary | ICD-10-CM | POA: Diagnosis not present

## 2021-05-18 ENCOUNTER — Other Ambulatory Visit: Payer: Self-pay | Admitting: Nurse Practitioner

## 2021-05-18 DIAGNOSIS — E1165 Type 2 diabetes mellitus with hyperglycemia: Secondary | ICD-10-CM | POA: Diagnosis not present

## 2021-05-18 MED ORDER — AMLODIPINE BESYLATE 10 MG PO TABS: ORAL_TABLET | ORAL | 0 refills | Status: DC

## 2021-05-19 DIAGNOSIS — E1165 Type 2 diabetes mellitus with hyperglycemia: Secondary | ICD-10-CM | POA: Diagnosis not present

## 2021-05-20 DIAGNOSIS — E1165 Type 2 diabetes mellitus with hyperglycemia: Secondary | ICD-10-CM | POA: Diagnosis not present

## 2021-05-21 DIAGNOSIS — E1165 Type 2 diabetes mellitus with hyperglycemia: Secondary | ICD-10-CM | POA: Diagnosis not present

## 2021-05-22 DIAGNOSIS — E1165 Type 2 diabetes mellitus with hyperglycemia: Secondary | ICD-10-CM | POA: Diagnosis not present

## 2021-05-23 DIAGNOSIS — E1165 Type 2 diabetes mellitus with hyperglycemia: Secondary | ICD-10-CM | POA: Diagnosis not present

## 2021-05-24 DIAGNOSIS — E1165 Type 2 diabetes mellitus with hyperglycemia: Secondary | ICD-10-CM | POA: Diagnosis not present

## 2021-05-25 DIAGNOSIS — E1165 Type 2 diabetes mellitus with hyperglycemia: Secondary | ICD-10-CM | POA: Diagnosis not present

## 2021-05-26 DIAGNOSIS — E1165 Type 2 diabetes mellitus with hyperglycemia: Secondary | ICD-10-CM | POA: Diagnosis not present

## 2021-05-27 DIAGNOSIS — E1165 Type 2 diabetes mellitus with hyperglycemia: Secondary | ICD-10-CM | POA: Diagnosis not present

## 2021-05-28 DIAGNOSIS — E1165 Type 2 diabetes mellitus with hyperglycemia: Secondary | ICD-10-CM | POA: Diagnosis not present

## 2021-05-29 DIAGNOSIS — E1165 Type 2 diabetes mellitus with hyperglycemia: Secondary | ICD-10-CM | POA: Diagnosis not present

## 2021-05-30 DIAGNOSIS — E1165 Type 2 diabetes mellitus with hyperglycemia: Secondary | ICD-10-CM | POA: Diagnosis not present

## 2021-05-31 DIAGNOSIS — E1165 Type 2 diabetes mellitus with hyperglycemia: Secondary | ICD-10-CM | POA: Diagnosis not present

## 2021-06-01 DIAGNOSIS — E1165 Type 2 diabetes mellitus with hyperglycemia: Secondary | ICD-10-CM | POA: Diagnosis not present

## 2021-06-02 DIAGNOSIS — E1165 Type 2 diabetes mellitus with hyperglycemia: Secondary | ICD-10-CM | POA: Diagnosis not present

## 2021-06-03 DIAGNOSIS — E1165 Type 2 diabetes mellitus with hyperglycemia: Secondary | ICD-10-CM | POA: Diagnosis not present

## 2021-06-04 DIAGNOSIS — E1165 Type 2 diabetes mellitus with hyperglycemia: Secondary | ICD-10-CM | POA: Diagnosis not present

## 2021-06-05 DIAGNOSIS — E1165 Type 2 diabetes mellitus with hyperglycemia: Secondary | ICD-10-CM | POA: Diagnosis not present

## 2021-06-06 DIAGNOSIS — E1165 Type 2 diabetes mellitus with hyperglycemia: Secondary | ICD-10-CM | POA: Diagnosis not present

## 2021-06-07 DIAGNOSIS — E1165 Type 2 diabetes mellitus with hyperglycemia: Secondary | ICD-10-CM | POA: Diagnosis not present

## 2021-06-08 DIAGNOSIS — E1165 Type 2 diabetes mellitus with hyperglycemia: Secondary | ICD-10-CM | POA: Diagnosis not present

## 2021-06-09 DIAGNOSIS — E1165 Type 2 diabetes mellitus with hyperglycemia: Secondary | ICD-10-CM | POA: Diagnosis not present

## 2021-06-10 DIAGNOSIS — E1165 Type 2 diabetes mellitus with hyperglycemia: Secondary | ICD-10-CM | POA: Diagnosis not present

## 2021-06-11 DIAGNOSIS — E1165 Type 2 diabetes mellitus with hyperglycemia: Secondary | ICD-10-CM | POA: Diagnosis not present

## 2021-06-12 DIAGNOSIS — E1165 Type 2 diabetes mellitus with hyperglycemia: Secondary | ICD-10-CM | POA: Diagnosis not present

## 2021-06-13 DIAGNOSIS — E1165 Type 2 diabetes mellitus with hyperglycemia: Secondary | ICD-10-CM | POA: Diagnosis not present

## 2021-06-14 DIAGNOSIS — E1165 Type 2 diabetes mellitus with hyperglycemia: Secondary | ICD-10-CM | POA: Diagnosis not present

## 2021-06-15 DIAGNOSIS — E1165 Type 2 diabetes mellitus with hyperglycemia: Secondary | ICD-10-CM | POA: Diagnosis not present

## 2021-06-16 DIAGNOSIS — E1165 Type 2 diabetes mellitus with hyperglycemia: Secondary | ICD-10-CM | POA: Diagnosis not present

## 2021-06-16 NOTE — Progress Notes (Signed)
? ?Cardiology Office Note ? ? ?Date:  06/18/2021  ? ?ID:  Ana [name], DOB 21-Oct-1970, MRN [id number] ? ?PCP:  Renee Rival, FNP  ?Cardiologist:   Dorris Carnes, MD  ? ?Pt returns for follow up of  HTN    ?  ?History of Present Illness: ?Ana [name] is a 51 y.o. female  with a hx of HTN, DM, hyothyoridism, LE edema and chest tightness.  Echo in 2022 showed moderate LVH and vigorous LV function   Also small/moderate pericardial effusion  ?Chest tightness improved with Rx with lasix ?I last saw the pt in Aug 2022.   Since then she denies CP   Breathing is OK   Notes minimal leg swelling    ? ? ?Current Meds  ?Medication Sig  ? ACCU-CHEK GUIDE test strip daily.  ? Accu-Chek Softclix Lancets lancets daily.  ? amLODipine (NORVASC) 10 MG tablet Take 47m daily  ? blood glucose meter kit and supplies KIT Dispense based on patient and insurance preference. Use daily to check your blood sugar once per day.  ? Blood Pressure Monitoring (SPHYGMOMANOMETER) MISC 1 each by Does not apply route daily.  ? carvedilol (COREG) 12.5 MG tablet Take 1 tablet (12.5 mg total) by mouth 2 (two) times daily.  ? Cholecalciferol 25 MCG (1000 UT) capsule Take by mouth.  ? furosemide (LASIX) 20 MG tablet TAKE 1 TABLET(20 MG) BY MOUTH DAILY  ? glipiZIDE (GLUCOTROL) 10 MG tablet TAKE 1 TABLET(10 MG) BY MOUTH TWICE DAILY BEFORE A MEAL  ? hydrALAZINE (APRESOLINE) 50 MG tablet Take 1 tablet (50 mg total) by mouth 2 (two) times daily.  ? levothyroxine (SYNTHROID) 50 MCG tablet TAKE 1 TABLET(50 MCG) BY MOUTH DAILY  ? levothyroxine (SYNTHROID) 50 MCG tablet TAKE 1 TABLET(50 MCG) BY MOUTH DAILY  ? losartan (COZAAR) 100 MG tablet TAKE 1 TABLET(100 MG) BY MOUTH DAILY  ? sodium bicarbonate 650 MG tablet Take 650 mg by mouth 4 (four) times daily.  ? ? ? ?Allergies:   Ranitidine and Ranitidine hcl  ? ?Past Medical History:  ?Diagnosis Date  ? CKD (chronic kidney disease)   ? Diabetes mellitus   ? diet controlled  ? Hypertension   ? ? ?Past Surgical  History:  ?Procedure Laterality Date  ? CESAREAN SECTION  x4  ? CHOLECYSTECTOMY    ? EYE SURGERY    ? MASS EXCISION  10/27/2011  ? Procedure: EXCISION MASS;  Surgeon: BDonato Heinz MD;  Location: [facility];  Service: General;  Laterality: Right;  ? TUBAL LIGATION    ? ? ? ?Social History:  The patient  reports that she has never smoked. She has never used smokeless tobacco. She reports that she does not currently use alcohol. She reports that she does not use drugs.  ? ?Family History:  The patient's family history includes Diabetes in her brother, brother, and mother; Kidney disease in her father and mother; Kidney failure in her brother, father, and mother.  ? ? ?ROS:  Please see the history of present illness. All other systems are reviewed and  Negative to the above problem except as noted.  ? ? ?PHYSICAL EXAM: ?VS:  BP (!) 166/98 Comment: Has not taken medication yet  Pulse 84   Ht 5' (1.524 m)   Wt 195 lb 12.8 oz (88.8 kg)   LMP 09/22/2019 (Approximate)   SpO2 99%   BMI 38.24 kg/m?   ?GEN: Morbidly obese 51yo  in no acute distress  ?HEENT: normal  ?  Neck: JVP is normal ?Cardiac: RRR; no murmurs, LE  Trivial  edema  ?Respiratory:  clear to auscultation bilaterally ?GI: soft, nontender, nondistended, + BS  No hepatomegaly  ?MS: no deformity Moving all extremities   ?Skin: warm and dry, no rash ?Neuro:  Strength and sensation are intact ?Psych: euthymic mood, full affect ? ? ?EKG:  EKG is not ordered today.  ? ?Echo   09/16/20 ? ?Left ventricular ejection fraction, by estimation, is 60 to 65%. The left ventricle has normal ?function. The left ventricle has no regional wall motion abnormalities. There is moderate left ?ventricular hypertrophy. Left ventricular diastolic parameters were normal. ?2. Right ventricular systolic function is normal. The right ventricular size is normal. There is ?normal pulmonary artery systolic pressure. The estimated right ventricular systolic pressure is ?43.5 mmHg. ?3. Left  atrial size was mildly dilated. ?4. Right atrial size was mildly dilated. ?5. Small to moderate pericardial effusion. The pericardial effusion is circumferential with ?moderate collection posteriorly. No clear evidence of tamponade physiology. ?6. The mitral valve is grossly normal. Trivial mitral valve regurgitation. ?7. The aortic valve is tricuspid. There is mild calcification of the aortic valve. Aortic valve ?regurgitation is not visualized. ?8. The inferior vena cava is normal in size with greater than 50% respiratory variability, ?suggesting right atrial pressure of 3 mmHg. ? ?Lipid Panel ?   ?Component Value Date/Time  ? CHOL 248 (H) 06/17/2021 1430  ? TRIG 75 06/17/2021 1430  ? HDL 70 06/17/2021 1430  ? CHOLHDL 3.5 06/17/2021 1430  ? VLDL 15 06/17/2021 1430  ? LDLCALC 163 (H) 06/17/2021 1430  ? [place] 154 (H) 03/18/2020 0000  ? ?  ? ?Wt Readings from Last 3 Encounters:  ?06/17/21 195 lb 12.8 oz (88.8 kg)  ?01/24/21 180 lb (81.6 kg)  ?01/12/21 174 lb (78.9 kg)  ?  ? ? ?ASSESSMENT AND PLAN: ? ?1   HTN   Blood pressure is high today but she says she has not taken any of her meds     Discussed importance of taking meds early in day    ?Will arrange for her to get a BP cuff since she does not have one    ?Keep on same regimen   Follow up in a few months     ? ?2  LE edema   Mild   Watch salt    ? ?3  CKD   Cr 2.16 in Aug 2022  Repeat today   ? ?4  HL  Lipids today ? ?5  Hx DM  Discussed diet   A1C in July 2022 was 10.5   Will recheck    Referral to [name] for comprehensive diabeit care care ?Follow up based on test results  ? ?

## 2021-06-17 ENCOUNTER — Ambulatory Visit (INDEPENDENT_AMBULATORY_CARE_PROVIDER_SITE_OTHER): Payer: Medicaid Other | Admitting: Internal Medicine

## 2021-06-17 ENCOUNTER — Encounter: Payer: Self-pay | Admitting: Internal Medicine

## 2021-06-17 ENCOUNTER — Other Ambulatory Visit (HOSPITAL_COMMUNITY)
Admission: RE | Admit: 2021-06-17 | Discharge: 2021-06-17 | Disposition: A | Discharge status: Home / Self Care | Payer: Medicaid Other | Source: Ambulatory Visit | Attending: Internal Medicine | Admitting: Internal Medicine

## 2021-06-17 VITALS — BP 166/98 | HR 84 | Ht 60.0 in | Wt 195.8 lb

## 2021-06-17 DIAGNOSIS — Z79899 Other long term (current) drug therapy: Secondary | ICD-10-CM | POA: Diagnosis not present

## 2021-06-17 DIAGNOSIS — E782 Mixed hyperlipidemia: Secondary | ICD-10-CM

## 2021-06-17 DIAGNOSIS — E1165 Type 2 diabetes mellitus with hyperglycemia: Secondary | ICD-10-CM | POA: Diagnosis not present

## 2021-06-17 DIAGNOSIS — I10 Essential (primary) hypertension: Secondary | ICD-10-CM

## 2021-06-17 LAB — CBC
HCT: 30.5 % — ABNORMAL LOW (ref 36.0–46.0)
Hemoglobin: 9.9 g/dL — ABNORMAL LOW (ref 12.0–15.0)
MCH: 28 pg (ref 26.0–34.0)
MCHC: 32.5 g/dL (ref 30.0–36.0)
MCV: 86.2 fL (ref 80.0–100.0)
Platelets: 383 10*3/uL (ref 150–400)
RBC: 3.54 MIL/uL — ABNORMAL LOW (ref 3.87–5.11)
RDW: 15.1 % (ref 11.5–15.5)
WBC: 8.2 10*3/uL (ref 4.0–10.5)
nRBC: 0 % (ref 0.0–0.2)

## 2021-06-17 LAB — HEMOGLOBIN A1C
Hgb A1c MFr Bld: 8.3 % — ABNORMAL HIGH (ref 4.8–5.6)
Mean Plasma Glucose: 191.51 mg/dL

## 2021-06-17 LAB — COMPREHENSIVE METABOLIC PANEL
ALT: 16 U/L (ref 0–44)
AST: 16 U/L (ref 15–41)
Albumin: 3.3 g/dL — ABNORMAL LOW (ref 3.5–5.0)
Alkaline Phosphatase: 133 U/L — ABNORMAL HIGH (ref 38–126)
Anion gap: 8 (ref 5–15)
BUN: 43 mg/dL — ABNORMAL HIGH (ref 6–20)
CO2: 21 mmol/L — ABNORMAL LOW (ref 22–32)
Calcium: 8.8 mg/dL — ABNORMAL LOW (ref 8.9–10.3)
Chloride: 109 mmol/L (ref 98–111)
Creatinine, Ser: 3.75 mg/dL — ABNORMAL HIGH (ref 0.44–1.00)
GFR, Estimated: 14 mL/min — ABNORMAL LOW (ref >60)
Glucose, Bld: 159 mg/dL — ABNORMAL HIGH (ref 70–99)
Potassium: 4 mmol/L (ref 3.5–5.1)
Sodium: 138 mmol/L (ref 135–145)
Total Bilirubin: 0.5 mg/dL (ref 0.3–1.2)
Total Protein: 8.2 g/dL — ABNORMAL HIGH (ref 6.5–8.1)

## 2021-06-17 LAB — LIPID PANEL
Cholesterol: 248 mg/dL — ABNORMAL HIGH (ref 0–200)
HDL: 70 mg/dL (ref >40)
LDL Cholesterol: 163 mg/dL — ABNORMAL HIGH (ref 0–99)
Total CHOL/HDL Ratio: 3.5 RATIO
Triglycerides: 75 mg/dL (ref <150)
VLDL: 15 mg/dL (ref 0–40)

## 2021-06-17 LAB — TSH: TSH: 3.792 u[IU]/mL (ref 0.350–4.500)

## 2021-06-17 NOTE — Patient Instructions (Signed)
Medication Instructions:  ?Your physician recommends that you continue on your current medications as directed. Please refer to the Current Medication list given to you today. ? ?*If you need a refill on your cardiac medications before your next appointment, please call your pharmacy* ? ? ?Lab Work: ?Your physician recommends that you return for lab work in: Today  ?  ?If you have labs (blood work) drawn today and your tests are completely normal, you will receive your results only by: ?MyChart Message (if you have MyChart) OR ?A paper copy in the mail ?If you have any lab test that is abnormal or we need to change your treatment, we will call you to review the results. ? ? ?Testing/Procedures: ?NONE  ? ? ?Follow-Up: ?At [facility], you and your health needs are our priority.  As part of our continuing mission to provide you with exceptional heart care, we have created designated Provider Care Teams.  These Care Teams include your primary Cardiologist (physician) and Advanced Practice Providers (APPs -  Physician Assistants and Nurse Practitioners) who all work together to provide you with the care you need, when you need it. ? ?We recommend signing up for the patient portal called "MyChart".  Sign up information is provided on this After Visit Summary.  MyChart is used to connect with patients for Virtual Visits (Telemedicine).  Patients are able to view lab/test results, encounter notes, upcoming appointments, etc.  Non-urgent messages can be sent to your provider as well.   ?To learn more about what you can do with MyChart, go to [email or web address].   ? ?Your next appointment:   ? August  ? ?The format for your next appointment:   ?In Person ? ?Provider:   ?Dorris Carnes, MD  ? ? ?Other Instructions ?Thank you for choosing [facility]! ? ? ? ?Important Information About Sugar ? ? ? ? ? ? ?

## 2021-06-18 DIAGNOSIS — E1165 Type 2 diabetes mellitus with hyperglycemia: Secondary | ICD-10-CM | POA: Diagnosis not present

## 2021-06-19 DIAGNOSIS — E1165 Type 2 diabetes mellitus with hyperglycemia: Secondary | ICD-10-CM | POA: Diagnosis not present

## 2021-06-20 ENCOUNTER — Telehealth: Payer: Self-pay

## 2021-06-20 DIAGNOSIS — E1165 Type 2 diabetes mellitus with hyperglycemia: Secondary | ICD-10-CM | POA: Diagnosis not present

## 2021-06-20 NOTE — Telephone Encounter (Signed)
Lab results discussed with patient. She will call Dr.Bhutani's office now.She had cancelled a previous apt because she had a cold.I will forward her labs to him. ? ? ?She states she does not take any medication for cholesterol. I will inform Dr.Ross ? ? ?I have also place a referral to [facility]. ?

## 2021-06-20 NOTE — Telephone Encounter (Signed)
-----   Message from Nuala Alpha, LPN sent at 06/18/310  7:44 AM EDT ----- ? ?----- Message ----- ?From: Fay Records, MD ?Sent: 06/19/2021   8:39 AM EDT ?To: Cv Div [facility] St Triage ? ?Pt is anemic   Hgb is a low  9.9   Stable over past year  ?Thyroid function is normal ?Kidney function is worse   Cr 3.75    Pt should have appt in renal clinic (urgent) if not      seen already  This is up significantly from 1 year ago   ?BP was up when I saw her    She had not taken meds  ?LDL is 163  ? Is she taking cholesterol med?   IT was much better before   ?A1C is 8.3   Better than 10.5   Still room to improve   SHould have referral to enodcrine ([name]) ? ?

## 2021-06-20 NOTE — Progress Notes (Signed)
Spoke with pt advised of fola's message and scheduled appt ?

## 2021-06-21 ENCOUNTER — Telehealth: Payer: Self-pay

## 2021-06-21 ENCOUNTER — Other Ambulatory Visit: Payer: Self-pay | Admitting: Nurse Practitioner

## 2021-06-21 DIAGNOSIS — E782 Mixed hyperlipidemia: Secondary | ICD-10-CM

## 2021-06-21 DIAGNOSIS — E1165 Type 2 diabetes mellitus with hyperglycemia: Secondary | ICD-10-CM | POA: Diagnosis not present

## 2021-06-21 MED ORDER — ROSUVASTATIN CALCIUM 40 MG PO TABS: 40.0000 mg | ORAL_TABLET | Freq: Every day | ORAL | 3 refills | Status: DC

## 2021-06-21 MED ORDER — FUROSEMIDE 20 MG PO TABS: ORAL_TABLET | ORAL | 3 refills | Status: DC

## 2021-06-21 MED ORDER — GLIPIZIDE 10 MG PO TABS: ORAL_TABLET | ORAL | 3 refills | Status: DC

## 2021-06-21 NOTE — Telephone Encounter (Signed)
Patient agrees to start crestor 40 mg daily and repeat labs in 8 weeks ?

## 2021-06-21 NOTE — Telephone Encounter (Signed)
-----   Message from Stephani Police, RN sent at 06/21/2021 12:18 PM EDT ----- ? ?----- Message ----- ?From: Fay Records, MD ?Sent: 06/20/2021   5:38 PM EDT ?To: Stephani Police, RN ? ?Recommend adding Crestor 40 mg     ?F/ U lipid panel and liver panel in 8 wks  ? ?

## 2021-06-22 DIAGNOSIS — E1165 Type 2 diabetes mellitus with hyperglycemia: Secondary | ICD-10-CM | POA: Diagnosis not present

## 2021-06-23 DIAGNOSIS — E1165 Type 2 diabetes mellitus with hyperglycemia: Secondary | ICD-10-CM | POA: Diagnosis not present

## 2021-06-24 DIAGNOSIS — E1165 Type 2 diabetes mellitus with hyperglycemia: Secondary | ICD-10-CM | POA: Diagnosis not present

## 2021-06-25 DIAGNOSIS — E1165 Type 2 diabetes mellitus with hyperglycemia: Secondary | ICD-10-CM | POA: Diagnosis not present

## 2021-06-26 DIAGNOSIS — E1165 Type 2 diabetes mellitus with hyperglycemia: Secondary | ICD-10-CM | POA: Diagnosis not present

## 2021-06-27 DIAGNOSIS — E1165 Type 2 diabetes mellitus with hyperglycemia: Secondary | ICD-10-CM | POA: Diagnosis not present

## 2021-06-28 DIAGNOSIS — E1165 Type 2 diabetes mellitus with hyperglycemia: Secondary | ICD-10-CM | POA: Diagnosis not present

## 2021-06-29 DIAGNOSIS — E1165 Type 2 diabetes mellitus with hyperglycemia: Secondary | ICD-10-CM | POA: Diagnosis not present

## 2021-06-30 DIAGNOSIS — E1165 Type 2 diabetes mellitus with hyperglycemia: Secondary | ICD-10-CM | POA: Diagnosis not present

## 2021-07-03 DIAGNOSIS — E1165 Type 2 diabetes mellitus with hyperglycemia: Secondary | ICD-10-CM | POA: Diagnosis not present

## 2021-07-04 DIAGNOSIS — E1165 Type 2 diabetes mellitus with hyperglycemia: Secondary | ICD-10-CM | POA: Diagnosis not present

## 2021-07-05 ENCOUNTER — Ambulatory Visit: Payer: Medicaid Other | Admitting: Nurse Practitioner

## 2021-07-05 DIAGNOSIS — E1165 Type 2 diabetes mellitus with hyperglycemia: Secondary | ICD-10-CM | POA: Diagnosis not present

## 2021-07-05 NOTE — Patient Instructions (Incomplete)

## 2021-07-06 DIAGNOSIS — E1165 Type 2 diabetes mellitus with hyperglycemia: Secondary | ICD-10-CM | POA: Diagnosis not present

## 2021-07-07 DIAGNOSIS — E1165 Type 2 diabetes mellitus with hyperglycemia: Secondary | ICD-10-CM | POA: Diagnosis not present

## 2021-07-08 DIAGNOSIS — E1165 Type 2 diabetes mellitus with hyperglycemia: Secondary | ICD-10-CM | POA: Diagnosis not present

## 2021-07-09 DIAGNOSIS — E1165 Type 2 diabetes mellitus with hyperglycemia: Secondary | ICD-10-CM | POA: Diagnosis not present

## 2021-07-10 DIAGNOSIS — E1165 Type 2 diabetes mellitus with hyperglycemia: Secondary | ICD-10-CM | POA: Diagnosis not present

## 2021-07-11 DIAGNOSIS — E1165 Type 2 diabetes mellitus with hyperglycemia: Secondary | ICD-10-CM | POA: Diagnosis not present

## 2021-07-12 DIAGNOSIS — E1165 Type 2 diabetes mellitus with hyperglycemia: Secondary | ICD-10-CM | POA: Diagnosis not present

## 2021-07-13 DIAGNOSIS — E1165 Type 2 diabetes mellitus with hyperglycemia: Secondary | ICD-10-CM | POA: Diagnosis not present

## 2021-07-14 DIAGNOSIS — E1165 Type 2 diabetes mellitus with hyperglycemia: Secondary | ICD-10-CM | POA: Diagnosis not present

## 2021-07-17 DIAGNOSIS — E1165 Type 2 diabetes mellitus with hyperglycemia: Secondary | ICD-10-CM | POA: Diagnosis not present

## 2021-07-18 DIAGNOSIS — E1165 Type 2 diabetes mellitus with hyperglycemia: Secondary | ICD-10-CM | POA: Diagnosis not present

## 2021-07-19 DIAGNOSIS — E1165 Type 2 diabetes mellitus with hyperglycemia: Secondary | ICD-10-CM | POA: Diagnosis not present

## 2021-07-20 DIAGNOSIS — E1165 Type 2 diabetes mellitus with hyperglycemia: Secondary | ICD-10-CM | POA: Diagnosis not present

## 2021-07-21 DIAGNOSIS — E1165 Type 2 diabetes mellitus with hyperglycemia: Secondary | ICD-10-CM | POA: Diagnosis not present

## 2021-07-22 ENCOUNTER — Ambulatory Visit: Payer: Medicaid Other | Admitting: Nurse Practitioner

## 2021-07-22 DIAGNOSIS — E1165 Type 2 diabetes mellitus with hyperglycemia: Secondary | ICD-10-CM | POA: Diagnosis not present

## 2021-07-23 DIAGNOSIS — E1165 Type 2 diabetes mellitus with hyperglycemia: Secondary | ICD-10-CM | POA: Diagnosis not present

## 2021-07-24 DIAGNOSIS — E1165 Type 2 diabetes mellitus with hyperglycemia: Secondary | ICD-10-CM | POA: Diagnosis not present

## 2021-07-25 DIAGNOSIS — E1165 Type 2 diabetes mellitus with hyperglycemia: Secondary | ICD-10-CM | POA: Diagnosis not present

## 2021-07-26 DIAGNOSIS — E1165 Type 2 diabetes mellitus with hyperglycemia: Secondary | ICD-10-CM | POA: Diagnosis not present

## 2021-07-27 DIAGNOSIS — E1165 Type 2 diabetes mellitus with hyperglycemia: Secondary | ICD-10-CM | POA: Diagnosis not present

## 2021-07-28 DIAGNOSIS — E1165 Type 2 diabetes mellitus with hyperglycemia: Secondary | ICD-10-CM | POA: Diagnosis not present

## 2021-07-29 ENCOUNTER — Other Ambulatory Visit: Payer: Self-pay | Admitting: Nurse Practitioner

## 2021-07-31 DIAGNOSIS — E1165 Type 2 diabetes mellitus with hyperglycemia: Secondary | ICD-10-CM | POA: Diagnosis not present

## 2021-08-01 DIAGNOSIS — E1165 Type 2 diabetes mellitus with hyperglycemia: Secondary | ICD-10-CM | POA: Diagnosis not present

## 2021-08-02 DIAGNOSIS — E1165 Type 2 diabetes mellitus with hyperglycemia: Secondary | ICD-10-CM | POA: Diagnosis not present

## 2021-08-03 DIAGNOSIS — E1165 Type 2 diabetes mellitus with hyperglycemia: Secondary | ICD-10-CM | POA: Diagnosis not present

## 2021-08-04 DIAGNOSIS — E1165 Type 2 diabetes mellitus with hyperglycemia: Secondary | ICD-10-CM | POA: Diagnosis not present

## 2021-08-05 DIAGNOSIS — E1165 Type 2 diabetes mellitus with hyperglycemia: Secondary | ICD-10-CM | POA: Diagnosis not present

## 2021-08-06 DIAGNOSIS — E1165 Type 2 diabetes mellitus with hyperglycemia: Secondary | ICD-10-CM | POA: Diagnosis not present

## 2021-08-07 DIAGNOSIS — E1165 Type 2 diabetes mellitus with hyperglycemia: Secondary | ICD-10-CM | POA: Diagnosis not present

## 2021-08-08 DIAGNOSIS — E1165 Type 2 diabetes mellitus with hyperglycemia: Secondary | ICD-10-CM | POA: Diagnosis not present

## 2021-08-09 DIAGNOSIS — E1165 Type 2 diabetes mellitus with hyperglycemia: Secondary | ICD-10-CM | POA: Diagnosis not present

## 2021-08-10 DIAGNOSIS — E1165 Type 2 diabetes mellitus with hyperglycemia: Secondary | ICD-10-CM | POA: Diagnosis not present

## 2021-08-11 DIAGNOSIS — E1165 Type 2 diabetes mellitus with hyperglycemia: Secondary | ICD-10-CM | POA: Diagnosis not present

## 2021-08-12 DIAGNOSIS — E1165 Type 2 diabetes mellitus with hyperglycemia: Secondary | ICD-10-CM | POA: Diagnosis not present

## 2021-08-13 DIAGNOSIS — E1165 Type 2 diabetes mellitus with hyperglycemia: Secondary | ICD-10-CM | POA: Diagnosis not present

## 2021-08-14 DIAGNOSIS — E1165 Type 2 diabetes mellitus with hyperglycemia: Secondary | ICD-10-CM | POA: Diagnosis not present

## 2021-08-15 DIAGNOSIS — E1165 Type 2 diabetes mellitus with hyperglycemia: Secondary | ICD-10-CM | POA: Diagnosis not present

## 2021-08-16 DIAGNOSIS — E1165 Type 2 diabetes mellitus with hyperglycemia: Secondary | ICD-10-CM | POA: Diagnosis not present

## 2021-08-17 DIAGNOSIS — E1165 Type 2 diabetes mellitus with hyperglycemia: Secondary | ICD-10-CM | POA: Diagnosis not present

## 2021-08-18 DIAGNOSIS — E1165 Type 2 diabetes mellitus with hyperglycemia: Secondary | ICD-10-CM | POA: Diagnosis not present

## 2021-08-21 DIAGNOSIS — E1165 Type 2 diabetes mellitus with hyperglycemia: Secondary | ICD-10-CM | POA: Diagnosis not present

## 2021-08-22 DIAGNOSIS — E1165 Type 2 diabetes mellitus with hyperglycemia: Secondary | ICD-10-CM | POA: Diagnosis not present

## 2021-08-23 DIAGNOSIS — E1165 Type 2 diabetes mellitus with hyperglycemia: Secondary | ICD-10-CM | POA: Diagnosis not present

## 2021-08-24 DIAGNOSIS — E1165 Type 2 diabetes mellitus with hyperglycemia: Secondary | ICD-10-CM | POA: Diagnosis not present

## 2021-08-25 ENCOUNTER — Ambulatory Visit: Payer: Medicaid Other | Admitting: Nurse Practitioner

## 2021-08-25 DIAGNOSIS — E1165 Type 2 diabetes mellitus with hyperglycemia: Secondary | ICD-10-CM | POA: Diagnosis not present

## 2021-08-26 DIAGNOSIS — E1165 Type 2 diabetes mellitus with hyperglycemia: Secondary | ICD-10-CM | POA: Diagnosis not present

## 2021-08-27 DIAGNOSIS — E1165 Type 2 diabetes mellitus with hyperglycemia: Secondary | ICD-10-CM | POA: Diagnosis not present

## 2021-08-28 DIAGNOSIS — E1165 Type 2 diabetes mellitus with hyperglycemia: Secondary | ICD-10-CM | POA: Diagnosis not present

## 2021-08-29 DIAGNOSIS — E1165 Type 2 diabetes mellitus with hyperglycemia: Secondary | ICD-10-CM | POA: Diagnosis not present

## 2021-08-30 DIAGNOSIS — E1165 Type 2 diabetes mellitus with hyperglycemia: Secondary | ICD-10-CM | POA: Diagnosis not present

## 2021-08-31 DIAGNOSIS — E1165 Type 2 diabetes mellitus with hyperglycemia: Secondary | ICD-10-CM | POA: Diagnosis not present

## 2021-09-01 DIAGNOSIS — E1165 Type 2 diabetes mellitus with hyperglycemia: Secondary | ICD-10-CM | POA: Diagnosis not present

## 2021-09-02 DIAGNOSIS — E1165 Type 2 diabetes mellitus with hyperglycemia: Secondary | ICD-10-CM | POA: Diagnosis not present

## 2021-09-03 ENCOUNTER — Other Ambulatory Visit: Payer: Self-pay | Admitting: Nurse Practitioner

## 2021-09-03 DIAGNOSIS — E1165 Type 2 diabetes mellitus with hyperglycemia: Secondary | ICD-10-CM | POA: Diagnosis not present

## 2021-09-04 DIAGNOSIS — E1165 Type 2 diabetes mellitus with hyperglycemia: Secondary | ICD-10-CM | POA: Diagnosis not present

## 2021-09-05 DIAGNOSIS — E1165 Type 2 diabetes mellitus with hyperglycemia: Secondary | ICD-10-CM | POA: Diagnosis not present

## 2021-09-06 DIAGNOSIS — E1165 Type 2 diabetes mellitus with hyperglycemia: Secondary | ICD-10-CM | POA: Diagnosis not present

## 2021-09-07 DIAGNOSIS — E1165 Type 2 diabetes mellitus with hyperglycemia: Secondary | ICD-10-CM | POA: Diagnosis not present

## 2021-09-08 DIAGNOSIS — E1165 Type 2 diabetes mellitus with hyperglycemia: Secondary | ICD-10-CM | POA: Diagnosis not present

## 2021-09-09 DIAGNOSIS — E1165 Type 2 diabetes mellitus with hyperglycemia: Secondary | ICD-10-CM | POA: Diagnosis not present

## 2021-09-10 DIAGNOSIS — E1165 Type 2 diabetes mellitus with hyperglycemia: Secondary | ICD-10-CM | POA: Diagnosis not present

## 2021-09-11 DIAGNOSIS — E1165 Type 2 diabetes mellitus with hyperglycemia: Secondary | ICD-10-CM | POA: Diagnosis not present

## 2021-09-12 DIAGNOSIS — E1165 Type 2 diabetes mellitus with hyperglycemia: Secondary | ICD-10-CM | POA: Diagnosis not present

## 2021-09-13 DIAGNOSIS — E1165 Type 2 diabetes mellitus with hyperglycemia: Secondary | ICD-10-CM | POA: Diagnosis not present

## 2021-09-14 DIAGNOSIS — E1165 Type 2 diabetes mellitus with hyperglycemia: Secondary | ICD-10-CM | POA: Diagnosis not present

## 2021-09-15 ENCOUNTER — Ambulatory Visit: Payer: Medicaid Other | Admitting: Nurse Practitioner

## 2021-09-15 ENCOUNTER — Encounter: Payer: Self-pay | Admitting: Nurse Practitioner

## 2021-09-15 DIAGNOSIS — E1165 Type 2 diabetes mellitus with hyperglycemia: Secondary | ICD-10-CM | POA: Diagnosis not present

## 2021-09-16 DIAGNOSIS — E1165 Type 2 diabetes mellitus with hyperglycemia: Secondary | ICD-10-CM | POA: Diagnosis not present

## 2021-09-17 DIAGNOSIS — E1165 Type 2 diabetes mellitus with hyperglycemia: Secondary | ICD-10-CM | POA: Diagnosis not present

## 2021-09-18 DIAGNOSIS — E1165 Type 2 diabetes mellitus with hyperglycemia: Secondary | ICD-10-CM | POA: Diagnosis not present

## 2021-09-19 DIAGNOSIS — E1165 Type 2 diabetes mellitus with hyperglycemia: Secondary | ICD-10-CM | POA: Diagnosis not present

## 2021-09-20 DIAGNOSIS — E1165 Type 2 diabetes mellitus with hyperglycemia: Secondary | ICD-10-CM | POA: Diagnosis not present

## 2021-09-21 DIAGNOSIS — E1165 Type 2 diabetes mellitus with hyperglycemia: Secondary | ICD-10-CM | POA: Diagnosis not present

## 2021-09-22 DIAGNOSIS — E1165 Type 2 diabetes mellitus with hyperglycemia: Secondary | ICD-10-CM | POA: Diagnosis not present

## 2021-09-23 DIAGNOSIS — E1165 Type 2 diabetes mellitus with hyperglycemia: Secondary | ICD-10-CM | POA: Diagnosis not present

## 2021-09-24 DIAGNOSIS — E1165 Type 2 diabetes mellitus with hyperglycemia: Secondary | ICD-10-CM | POA: Diagnosis not present

## 2021-09-25 DIAGNOSIS — E1165 Type 2 diabetes mellitus with hyperglycemia: Secondary | ICD-10-CM | POA: Diagnosis not present

## 2021-09-26 DIAGNOSIS — E1165 Type 2 diabetes mellitus with hyperglycemia: Secondary | ICD-10-CM | POA: Diagnosis not present

## 2021-09-27 DIAGNOSIS — E1165 Type 2 diabetes mellitus with hyperglycemia: Secondary | ICD-10-CM | POA: Diagnosis not present

## 2021-09-28 DIAGNOSIS — E1165 Type 2 diabetes mellitus with hyperglycemia: Secondary | ICD-10-CM | POA: Diagnosis not present

## 2021-09-29 DIAGNOSIS — E1165 Type 2 diabetes mellitus with hyperglycemia: Secondary | ICD-10-CM | POA: Diagnosis not present

## 2021-09-30 ENCOUNTER — Emergency Department (HOSPITAL_COMMUNITY)
Admission: EM | Admit: 2021-09-30 | Discharge: 2021-09-30 | Disposition: A | Discharge status: Home / Self Care | Payer: Medicaid Other | Attending: Emergency Medicine | Admitting: Emergency Medicine

## 2021-09-30 ENCOUNTER — Emergency Department (HOSPITAL_COMMUNITY): Payer: Medicaid Other

## 2021-09-30 ENCOUNTER — Encounter (HOSPITAL_COMMUNITY): Payer: Self-pay | Admitting: Emergency Medicine

## 2021-09-30 ENCOUNTER — Other Ambulatory Visit: Payer: Self-pay

## 2021-09-30 DIAGNOSIS — Z7984 Long term (current) use of oral hypoglycemic drugs: Secondary | ICD-10-CM | POA: Diagnosis not present

## 2021-09-30 DIAGNOSIS — R1030 Lower abdominal pain, unspecified: Secondary | ICD-10-CM | POA: Diagnosis not present

## 2021-09-30 DIAGNOSIS — Y92410 Unspecified street and highway as the place of occurrence of the external cause: Secondary | ICD-10-CM | POA: Insufficient documentation

## 2021-09-30 DIAGNOSIS — R600 Localized edema: Secondary | ICD-10-CM | POA: Insufficient documentation

## 2021-09-30 DIAGNOSIS — S3992XA Unspecified injury of lower back, initial encounter: Secondary | ICD-10-CM | POA: Diagnosis not present

## 2021-09-30 DIAGNOSIS — M545 Low back pain, unspecified: Secondary | ICD-10-CM | POA: Diagnosis present

## 2021-09-30 DIAGNOSIS — E1165 Type 2 diabetes mellitus with hyperglycemia: Secondary | ICD-10-CM | POA: Diagnosis not present

## 2021-09-30 DIAGNOSIS — M5459 Other low back pain: Secondary | ICD-10-CM | POA: Diagnosis not present

## 2021-09-30 MED ORDER — NAPROXEN 500 MG PO TABS: 500.0000 mg | ORAL_TABLET | Freq: Two times a day (BID) | ORAL | 0 refills | Status: DC

## 2021-09-30 MED ORDER — METHOCARBAMOL 500 MG PO TABS
500.0000 mg | ORAL_TABLET | Freq: Two times a day (BID) | ORAL | 0 refills | Status: DC | PRN
Start: 2021-09-30 — End: 2021-11-16

## 2021-09-30 MED ORDER — NAPROXEN 250 MG PO TABS
500.0000 mg | ORAL_TABLET | Freq: Once | ORAL | Status: AC
  Administered 2021-09-30: 500 mg via ORAL
  Filled 2021-09-30: qty 2
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
## 2021-09-30 NOTE — Discharge Instructions (Signed)
Thank you for allowing [place] to treat you in the emergency department today.  After reviewing your examination and potential testing that was done it appears that you are safe to go home.  I would like for you to follow-up with your doctor within the next several days, have them obtain your results and follow-up with them to review all of these tests.  If you should develop severe or worsening symptoms return to the emergency department immediately  Your x-rays are normal, no signs of broken bones  Please take Naprosyn, 500mg  by mouth twice daily as needed for pain - this in an antiinflammatory medicine (NSAID) and is similar to ibuprofen - many people feel that it is stronger than ibuprofen and it is easier to take since it is a smaller pill.  Please use this only for 1 week - if your pain persists, you will need to follow up with your doctor in the office for ongoing guidance and pain control.  Please take Robaxin, 500 mg up to 2 or 3 times a day as needed for muscle spasm, this is a muscle relaxer, it may cause generalized weakness, sleepiness and you should not drive or do important things while taking this medication.  This includes driving a vehicle or taking care of young children, these things should not be done while taking this medication.  RICE therapy:  Apply ice wrapped in a towel intermittently keeping it on the skin no longer than 10 minutes a couple of times an hour Elevate the affected extremity to help reduce blood flow and prevent swelling Use an anti-inflammatory if you are not allergic to it such as ibuprofen or Naprosyn to help with pain and swelling Use a compressive device whether it is an Ace wrap or an ankle sprain immobilizer to help minimize movement and compress the swelling

## 2021-10-01 DIAGNOSIS — E1165 Type 2 diabetes mellitus with hyperglycemia: Secondary | ICD-10-CM | POA: Diagnosis not present

## 2021-10-02 DIAGNOSIS — E1165 Type 2 diabetes mellitus with hyperglycemia: Secondary | ICD-10-CM | POA: Diagnosis not present

## 2021-10-03 DIAGNOSIS — E1165 Type 2 diabetes mellitus with hyperglycemia: Secondary | ICD-10-CM | POA: Diagnosis not present

## 2021-10-04 DIAGNOSIS — E1165 Type 2 diabetes mellitus with hyperglycemia: Secondary | ICD-10-CM | POA: Diagnosis not present

## 2021-10-05 DIAGNOSIS — E1165 Type 2 diabetes mellitus with hyperglycemia: Secondary | ICD-10-CM | POA: Diagnosis not present

## 2021-10-06 ENCOUNTER — Other Ambulatory Visit: Payer: Self-pay | Admitting: Nurse Practitioner

## 2021-10-06 DIAGNOSIS — E1165 Type 2 diabetes mellitus with hyperglycemia: Secondary | ICD-10-CM | POA: Diagnosis not present

## 2021-10-07 DIAGNOSIS — E1165 Type 2 diabetes mellitus with hyperglycemia: Secondary | ICD-10-CM | POA: Diagnosis not present

## 2021-10-08 DIAGNOSIS — E1165 Type 2 diabetes mellitus with hyperglycemia: Secondary | ICD-10-CM | POA: Diagnosis not present

## 2021-10-09 DIAGNOSIS — E1165 Type 2 diabetes mellitus with hyperglycemia: Secondary | ICD-10-CM | POA: Diagnosis not present

## 2021-10-10 DIAGNOSIS — E1165 Type 2 diabetes mellitus with hyperglycemia: Secondary | ICD-10-CM | POA: Diagnosis not present

## 2021-10-11 DIAGNOSIS — E1165 Type 2 diabetes mellitus with hyperglycemia: Secondary | ICD-10-CM | POA: Diagnosis not present

## 2021-10-12 DIAGNOSIS — E1165 Type 2 diabetes mellitus with hyperglycemia: Secondary | ICD-10-CM | POA: Diagnosis not present

## 2021-10-13 DIAGNOSIS — E1165 Type 2 diabetes mellitus with hyperglycemia: Secondary | ICD-10-CM | POA: Diagnosis not present

## 2021-10-14 DIAGNOSIS — E1165 Type 2 diabetes mellitus with hyperglycemia: Secondary | ICD-10-CM | POA: Diagnosis not present

## 2021-10-15 DIAGNOSIS — E1165 Type 2 diabetes mellitus with hyperglycemia: Secondary | ICD-10-CM | POA: Diagnosis not present

## 2021-10-16 DIAGNOSIS — E1165 Type 2 diabetes mellitus with hyperglycemia: Secondary | ICD-10-CM | POA: Diagnosis not present

## 2021-10-17 DIAGNOSIS — E1165 Type 2 diabetes mellitus with hyperglycemia: Secondary | ICD-10-CM | POA: Diagnosis not present

## 2021-10-18 DIAGNOSIS — E1165 Type 2 diabetes mellitus with hyperglycemia: Secondary | ICD-10-CM | POA: Diagnosis not present

## 2021-10-19 ENCOUNTER — Other Ambulatory Visit: Payer: Self-pay | Admitting: Nurse Practitioner

## 2021-10-19 ENCOUNTER — Telehealth: Payer: Self-pay

## 2021-10-19 ENCOUNTER — Other Ambulatory Visit: Payer: Self-pay

## 2021-10-19 DIAGNOSIS — E1165 Type 2 diabetes mellitus with hyperglycemia: Secondary | ICD-10-CM | POA: Diagnosis not present

## 2021-10-19 MED ORDER — LOSARTAN POTASSIUM 100 MG PO TABS: ORAL_TABLET | ORAL | 0 refills | Status: DC

## 2021-10-19 MED ORDER — GLIPIZIDE 10 MG PO TABS: ORAL_TABLET | ORAL | 0 refills | Status: DC

## 2021-10-19 MED ORDER — FUROSEMIDE 20 MG PO TABS: ORAL_TABLET | ORAL | 0 refills | Status: DC

## 2021-10-19 NOTE — Telephone Encounter (Signed)
Patient called asked if can call in meds until find new provider  furosemide (LASIX) 20 MG tablet  glipiZIDE (GLUCOTROL) 10 MG tablet   losartan (COZAAR) 100 MG tablet    Pharmacy:  [place]

## 2021-10-19 NOTE — Telephone Encounter (Signed)
Rx sent 

## 2021-10-20 DIAGNOSIS — E1165 Type 2 diabetes mellitus with hyperglycemia: Secondary | ICD-10-CM | POA: Diagnosis not present

## 2021-10-21 DIAGNOSIS — E1165 Type 2 diabetes mellitus with hyperglycemia: Secondary | ICD-10-CM | POA: Diagnosis not present

## 2021-10-22 DIAGNOSIS — E1165 Type 2 diabetes mellitus with hyperglycemia: Secondary | ICD-10-CM | POA: Diagnosis not present

## 2021-10-23 DIAGNOSIS — E1165 Type 2 diabetes mellitus with hyperglycemia: Secondary | ICD-10-CM | POA: Diagnosis not present

## 2021-10-24 DIAGNOSIS — E1165 Type 2 diabetes mellitus with hyperglycemia: Secondary | ICD-10-CM | POA: Diagnosis not present

## 2021-10-25 DIAGNOSIS — E1165 Type 2 diabetes mellitus with hyperglycemia: Secondary | ICD-10-CM | POA: Diagnosis not present

## 2021-10-26 DIAGNOSIS — E1165 Type 2 diabetes mellitus with hyperglycemia: Secondary | ICD-10-CM | POA: Diagnosis not present

## 2021-10-27 DIAGNOSIS — E1165 Type 2 diabetes mellitus with hyperglycemia: Secondary | ICD-10-CM | POA: Diagnosis not present

## 2021-10-28 DIAGNOSIS — E1165 Type 2 diabetes mellitus with hyperglycemia: Secondary | ICD-10-CM | POA: Diagnosis not present

## 2021-10-29 DIAGNOSIS — E1165 Type 2 diabetes mellitus with hyperglycemia: Secondary | ICD-10-CM | POA: Diagnosis not present

## 2021-10-30 DIAGNOSIS — E1165 Type 2 diabetes mellitus with hyperglycemia: Secondary | ICD-10-CM | POA: Diagnosis not present

## 2021-10-31 DIAGNOSIS — E1165 Type 2 diabetes mellitus with hyperglycemia: Secondary | ICD-10-CM | POA: Diagnosis not present

## 2021-11-01 DIAGNOSIS — E1165 Type 2 diabetes mellitus with hyperglycemia: Secondary | ICD-10-CM | POA: Diagnosis not present

## 2021-11-02 DIAGNOSIS — E1165 Type 2 diabetes mellitus with hyperglycemia: Secondary | ICD-10-CM | POA: Diagnosis not present

## 2021-11-03 ENCOUNTER — Other Ambulatory Visit: Payer: Self-pay | Admitting: Nurse Practitioner

## 2021-11-03 DIAGNOSIS — E1165 Type 2 diabetes mellitus with hyperglycemia: Secondary | ICD-10-CM | POA: Diagnosis not present

## 2021-11-04 DIAGNOSIS — E1165 Type 2 diabetes mellitus with hyperglycemia: Secondary | ICD-10-CM | POA: Diagnosis not present

## 2021-11-05 DIAGNOSIS — E1165 Type 2 diabetes mellitus with hyperglycemia: Secondary | ICD-10-CM | POA: Diagnosis not present

## 2021-11-06 DIAGNOSIS — E1165 Type 2 diabetes mellitus with hyperglycemia: Secondary | ICD-10-CM | POA: Diagnosis not present

## 2021-11-07 DIAGNOSIS — E1165 Type 2 diabetes mellitus with hyperglycemia: Secondary | ICD-10-CM | POA: Diagnosis not present

## 2021-11-08 DIAGNOSIS — E1165 Type 2 diabetes mellitus with hyperglycemia: Secondary | ICD-10-CM | POA: Diagnosis not present

## 2021-11-09 DIAGNOSIS — E1165 Type 2 diabetes mellitus with hyperglycemia: Secondary | ICD-10-CM | POA: Diagnosis not present

## 2021-11-10 DIAGNOSIS — E1165 Type 2 diabetes mellitus with hyperglycemia: Secondary | ICD-10-CM | POA: Diagnosis not present

## 2021-11-11 DIAGNOSIS — E1165 Type 2 diabetes mellitus with hyperglycemia: Secondary | ICD-10-CM | POA: Diagnosis not present

## 2021-11-12 ENCOUNTER — Other Ambulatory Visit: Payer: Self-pay | Admitting: Nurse Practitioner

## 2021-11-12 DIAGNOSIS — E1165 Type 2 diabetes mellitus with hyperglycemia: Secondary | ICD-10-CM | POA: Diagnosis not present

## 2021-11-13 ENCOUNTER — Other Ambulatory Visit: Payer: Self-pay

## 2021-11-13 ENCOUNTER — Inpatient Hospital Stay (HOSPITAL_COMMUNITY)
Admission: EM | Admit: 2021-11-13 | Discharge: 2021-11-16 | DRG: 871 | Disposition: A | Discharge status: Home / Self Care | Payer: Medicaid Other | Attending: Internal Medicine | Admitting: Internal Medicine

## 2021-11-13 ENCOUNTER — Emergency Department (HOSPITAL_COMMUNITY): Payer: Medicaid Other

## 2021-11-13 ENCOUNTER — Inpatient Hospital Stay (HOSPITAL_COMMUNITY): Payer: Medicaid Other

## 2021-11-13 ENCOUNTER — Encounter (HOSPITAL_COMMUNITY): Payer: Self-pay

## 2021-11-13 DIAGNOSIS — B951 Streptococcus, group B, as the cause of diseases classified elsewhere: Secondary | ICD-10-CM | POA: Diagnosis present

## 2021-11-13 DIAGNOSIS — E782 Mixed hyperlipidemia: Secondary | ICD-10-CM | POA: Diagnosis present

## 2021-11-13 DIAGNOSIS — E876 Hypokalemia: Secondary | ICD-10-CM | POA: Diagnosis not present

## 2021-11-13 DIAGNOSIS — Z20822 Contact with and (suspected) exposure to covid-19: Secondary | ICD-10-CM | POA: Diagnosis present

## 2021-11-13 DIAGNOSIS — Z79890 Hormone replacement therapy (postmenopausal): Secondary | ICD-10-CM

## 2021-11-13 DIAGNOSIS — E872 Acidosis, unspecified: Secondary | ICD-10-CM | POA: Diagnosis present

## 2021-11-13 DIAGNOSIS — I129 Hypertensive chronic kidney disease with stage 1 through stage 4 chronic kidney disease, or unspecified chronic kidney disease: Secondary | ICD-10-CM | POA: Diagnosis not present

## 2021-11-13 DIAGNOSIS — E1169 Type 2 diabetes mellitus with other specified complication: Secondary | ICD-10-CM | POA: Diagnosis present

## 2021-11-13 DIAGNOSIS — Z9851 Tubal ligation status: Secondary | ICD-10-CM

## 2021-11-13 DIAGNOSIS — I509 Heart failure, unspecified: Secondary | ICD-10-CM | POA: Diagnosis not present

## 2021-11-13 DIAGNOSIS — A419 Sepsis, unspecified organism: Secondary | ICD-10-CM | POA: Diagnosis not present

## 2021-11-13 DIAGNOSIS — R319 Hematuria, unspecified: Secondary | ICD-10-CM | POA: Diagnosis present

## 2021-11-13 DIAGNOSIS — N390 Urinary tract infection, site not specified: Secondary | ICD-10-CM | POA: Diagnosis not present

## 2021-11-13 DIAGNOSIS — I5033 Acute on chronic diastolic (congestive) heart failure: Secondary | ICD-10-CM | POA: Diagnosis not present

## 2021-11-13 DIAGNOSIS — Z8249 Family history of ischemic heart disease and other diseases of the circulatory system: Secondary | ICD-10-CM

## 2021-11-13 DIAGNOSIS — I10 Essential (primary) hypertension: Secondary | ICD-10-CM | POA: Diagnosis not present

## 2021-11-13 DIAGNOSIS — N184 Chronic kidney disease, stage 4 (severe): Secondary | ICD-10-CM | POA: Diagnosis not present

## 2021-11-13 DIAGNOSIS — E039 Hypothyroidism, unspecified: Secondary | ICD-10-CM | POA: Diagnosis not present

## 2021-11-13 DIAGNOSIS — Z6841 Body Mass Index (BMI) 40.0 and over, adult: Secondary | ICD-10-CM | POA: Diagnosis not present

## 2021-11-13 DIAGNOSIS — J80 Acute respiratory distress syndrome: Secondary | ICD-10-CM | POA: Diagnosis not present

## 2021-11-13 DIAGNOSIS — J810 Acute pulmonary edema: Secondary | ICD-10-CM | POA: Diagnosis not present

## 2021-11-13 DIAGNOSIS — D509 Iron deficiency anemia, unspecified: Secondary | ICD-10-CM | POA: Diagnosis present

## 2021-11-13 DIAGNOSIS — N179 Acute kidney failure, unspecified: Secondary | ICD-10-CM | POA: Diagnosis present

## 2021-11-13 DIAGNOSIS — J9601 Acute respiratory failure with hypoxia: Secondary | ICD-10-CM | POA: Diagnosis not present

## 2021-11-13 DIAGNOSIS — Z841 Family history of disorders of kidney and ureter: Secondary | ICD-10-CM

## 2021-11-13 DIAGNOSIS — E1122 Type 2 diabetes mellitus with diabetic chronic kidney disease: Secondary | ICD-10-CM | POA: Diagnosis present

## 2021-11-13 DIAGNOSIS — E1165 Type 2 diabetes mellitus with hyperglycemia: Secondary | ICD-10-CM | POA: Diagnosis present

## 2021-11-13 DIAGNOSIS — Z888 Allergy status to other drugs, medicaments and biological substances status: Secondary | ICD-10-CM

## 2021-11-13 DIAGNOSIS — I3139 Other pericardial effusion (noninflammatory): Secondary | ICD-10-CM | POA: Diagnosis not present

## 2021-11-13 DIAGNOSIS — D631 Anemia in chronic kidney disease: Secondary | ICD-10-CM | POA: Diagnosis not present

## 2021-11-13 DIAGNOSIS — Z79899 Other long term (current) drug therapy: Secondary | ICD-10-CM

## 2021-11-13 DIAGNOSIS — N1832 Chronic kidney disease, stage 3b: Secondary | ICD-10-CM

## 2021-11-13 DIAGNOSIS — E871 Hypo-osmolality and hyponatremia: Secondary | ICD-10-CM | POA: Diagnosis not present

## 2021-11-13 DIAGNOSIS — D649 Anemia, unspecified: Secondary | ICD-10-CM | POA: Diagnosis not present

## 2021-11-13 DIAGNOSIS — R52 Pain, unspecified: Secondary | ICD-10-CM | POA: Diagnosis not present

## 2021-11-13 DIAGNOSIS — N250 Renal osteodystrophy: Secondary | ICD-10-CM | POA: Diagnosis present

## 2021-11-13 DIAGNOSIS — R609 Edema, unspecified: Secondary | ICD-10-CM | POA: Diagnosis not present

## 2021-11-13 DIAGNOSIS — I130 Hypertensive heart and chronic kidney disease with heart failure and stage 1 through stage 4 chronic kidney disease, or unspecified chronic kidney disease: Secondary | ICD-10-CM | POA: Diagnosis not present

## 2021-11-13 DIAGNOSIS — E559 Vitamin D deficiency, unspecified: Secondary | ICD-10-CM | POA: Diagnosis present

## 2021-11-13 DIAGNOSIS — Z833 Family history of diabetes mellitus: Secondary | ICD-10-CM

## 2021-11-13 DIAGNOSIS — E785 Hyperlipidemia, unspecified: Secondary | ICD-10-CM | POA: Diagnosis not present

## 2021-11-13 DIAGNOSIS — R0602 Shortness of breath: Secondary | ICD-10-CM | POA: Diagnosis not present

## 2021-11-13 DIAGNOSIS — I5031 Acute diastolic (congestive) heart failure: Secondary | ICD-10-CM | POA: Diagnosis not present

## 2021-11-13 DIAGNOSIS — I110 Hypertensive heart disease with heart failure: Secondary | ICD-10-CM | POA: Diagnosis not present

## 2021-11-13 DIAGNOSIS — I5030 Unspecified diastolic (congestive) heart failure: Secondary | ICD-10-CM | POA: Diagnosis not present

## 2021-11-13 DIAGNOSIS — Z9049 Acquired absence of other specified parts of digestive tract: Secondary | ICD-10-CM

## 2021-11-13 LAB — BASIC METABOLIC PANEL
Anion gap: 10 (ref 5–15)
BUN: 40 mg/dL — ABNORMAL HIGH (ref 6–20)
CO2: 19 mmol/L — ABNORMAL LOW (ref 22–32)
Calcium: 8.5 mg/dL — ABNORMAL LOW (ref 8.9–10.3)
Chloride: 107 mmol/L (ref 98–111)
Creatinine, Ser: 5.15 mg/dL — ABNORMAL HIGH (ref 0.44–1.00)
GFR, Estimated: 10 mL/min — ABNORMAL LOW (ref >60)
Glucose, Bld: 268 mg/dL — ABNORMAL HIGH (ref 70–99)
Potassium: 3.7 mmol/L (ref 3.5–5.1)
Sodium: 136 mmol/L (ref 135–145)

## 2021-11-13 LAB — CBC
HCT: 26.9 % — ABNORMAL LOW (ref 36.0–46.0)
Hemoglobin: 8.8 g/dL — ABNORMAL LOW (ref 12.0–15.0)
MCH: 28.3 pg (ref 26.0–34.0)
MCHC: 32.7 g/dL (ref 30.0–36.0)
MCV: 86.5 fL (ref 80.0–100.0)
Platelets: 334 10*3/uL (ref 150–400)
RBC: 3.11 MIL/uL — ABNORMAL LOW (ref 3.87–5.11)
RDW: 13.3 % (ref 11.5–15.5)
WBC: 10.5 10*3/uL (ref 4.0–10.5)
nRBC: 0 % (ref 0.0–0.2)

## 2021-11-13 LAB — URINALYSIS, ROUTINE W REFLEX MICROSCOPIC
Bilirubin Urine: NEGATIVE
Glucose, UA: >=500 mg/dL — AB
Ketones, ur: NEGATIVE mg/dL
Nitrite: NEGATIVE
Protein, ur: >=300 mg/dL — AB
Specific Gravity, Urine: 1.01 (ref 1.005–1.030)
WBC, UA: >50 WBC/hpf — ABNORMAL HIGH (ref 0–5)
pH: 6 (ref 5.0–8.0)

## 2021-11-13 LAB — CBG MONITORING, ED: Glucose-Capillary: 237 mg/dL — ABNORMAL HIGH (ref 70–99)

## 2021-11-13 LAB — RESP PANEL BY RT-PCR (FLU A&B, COVID) ARPGX2
Influenza A by PCR: NEGATIVE
Influenza B by PCR: NEGATIVE
SARS Coronavirus 2 by RT PCR: NEGATIVE

## 2021-11-13 LAB — BLOOD GAS, VENOUS
Acid-base deficit: 5.2 mmol/L — ABNORMAL HIGH (ref 0.0–2.0)
Bicarbonate: 20.6 mmol/L (ref 20.0–28.0)
Drawn by: 27160
O2 Saturation: 76.5 %
Patient temperature: 36.5
pCO2, Ven: 39 mmHg — ABNORMAL LOW (ref 44–60)
pH, Ven: 7.33 (ref 7.25–7.43)
pO2, Ven: 44 mmHg (ref 32–45)

## 2021-11-13 LAB — BRAIN NATRIURETIC PEPTIDE: B Natriuretic Peptide: 546 pg/mL — ABNORMAL HIGH (ref 0.0–100.0)

## 2021-11-13 LAB — TSH: TSH: 3.795 u[IU]/mL (ref 0.350–4.500)

## 2021-11-13 LAB — HIV ANTIBODY (ROUTINE TESTING W REFLEX): HIV Screen 4th Generation wRfx: NONREACTIVE

## 2021-11-13 LAB — IRON AND TIBC
Iron: 46 ug/dL (ref 28–170)
Saturation Ratios: 13 % (ref 10.4–31.8)
TIBC: 345 ug/dL (ref 250–450)
UIBC: 299 ug/dL

## 2021-11-13 LAB — FERRITIN: Ferritin: 81 ng/mL (ref 11–307)

## 2021-11-13 LAB — TROPONIN I (HIGH SENSITIVITY)
Troponin I (High Sensitivity): 11 ng/L (ref <18)
Troponin I (High Sensitivity): 11 ng/L (ref <18)

## 2021-11-13 LAB — MAGNESIUM: Magnesium: 2 mg/dL (ref 1.7–2.4)

## 2021-11-13 LAB — PROTIME-INR
INR: 0.9 (ref 0.8–1.2)
Prothrombin Time: 12.2 seconds (ref 11.4–15.2)

## 2021-11-13 LAB — LACTATE DEHYDROGENASE: LDH: 194 U/L — ABNORMAL HIGH (ref 98–192)

## 2021-11-13 LAB — PHOSPHORUS: Phosphorus: 4.6 mg/dL (ref 2.5–4.6)

## 2021-11-13 MED ORDER — FUROSEMIDE 10 MG/ML IJ SOLN: 60.0000 mg | Freq: Two times a day (BID) | INTRAMUSCULAR | Status: DC

## 2021-11-13 MED ORDER — BUMETANIDE 0.25 MG/ML IJ SOLN
0.5000 mg | Freq: Once | INTRAMUSCULAR | Status: DC
  Filled 2021-11-13: qty 4

## 2021-11-13 MED ORDER — ROSUVASTATIN CALCIUM 20 MG PO TABS
40.0000 mg | ORAL_TABLET | Freq: Every day | ORAL | Status: DC
  Administered 2021-11-13 – 2021-11-15 (×3): 40 mg via ORAL
  Filled 2021-11-13 (×3): qty 2

## 2021-11-13 MED ORDER — BISACODYL 5 MG PO TBEC: 5.0000 mg | DELAYED_RELEASE_TABLET | Freq: Every day | ORAL | Status: DC | PRN

## 2021-11-13 MED ORDER — SODIUM CHLORIDE 0.9% FLUSH
3.0000 mL | Freq: Two times a day (BID) | INTRAVENOUS | Status: DC
  Administered 2021-11-13 – 2021-11-16 (×6): 3 mL via INTRAVENOUS

## 2021-11-13 MED ORDER — FUROSEMIDE 10 MG/ML IJ SOLN
120.0000 mg | Freq: Three times a day (TID) | INTRAVENOUS | Status: DC
  Administered 2021-11-13 – 2021-11-15 (×7): 120 mg via INTRAVENOUS
  Filled 2021-11-13 (×2): qty 12
  Filled 2021-11-13: qty 10
  Filled 2021-11-13 (×2): qty 12
  Filled 2021-11-13: qty 10
  Filled 2021-11-13: qty 12
  Filled 2021-11-13: qty 10
  Filled 2021-11-13: qty 12
  Filled 2021-11-13 (×2): qty 10

## 2021-11-13 MED ORDER — FLEET ENEMA 7-19 GM/118ML RE ENEM: 1.0000 | ENEMA | Freq: Once | RECTAL | Status: DC | PRN

## 2021-11-13 MED ORDER — SENNOSIDES-DOCUSATE SODIUM 8.6-50 MG PO TABS: 1.0000 | ORAL_TABLET | Freq: Every evening | ORAL | Status: DC | PRN

## 2021-11-13 MED ORDER — HYDRALAZINE HCL 50 MG PO TABS
50.0000 mg | ORAL_TABLET | Freq: Two times a day (BID) | ORAL | Status: DC
  Administered 2021-11-13 – 2021-11-16 (×7): 50 mg via ORAL
  Filled 2021-11-13 (×5): qty 1
  Filled 2021-11-13: qty 2
  Filled 2021-11-13: qty 1

## 2021-11-13 MED ORDER — NITROGLYCERIN 0.4 MG SL SUBL
0.4000 mg | SUBLINGUAL_TABLET | Freq: Once | SUBLINGUAL | Status: AC
  Administered 2021-11-13: 0.4 mg via SUBLINGUAL
  Filled 2021-11-13: qty 1

## 2021-11-13 MED ORDER — SODIUM CHLORIDE 0.9 % IV SOLN: 250.0000 mL | INTRAVENOUS | Status: DC | PRN

## 2021-11-13 MED ORDER — HYDROMORPHONE HCL 1 MG/ML IJ SOLN: 0.5000 mg | INTRAMUSCULAR | Status: DC | PRN

## 2021-11-13 MED ORDER — OXYCODONE HCL 5 MG PO TABS
5.0000 mg | ORAL_TABLET | ORAL | Status: DC | PRN
  Administered 2021-11-14: 5 mg via ORAL
  Filled 2021-11-13 (×2): qty 1

## 2021-11-13 MED ORDER — HYDRALAZINE HCL 20 MG/ML IJ SOLN: 10.0000 mg | INTRAMUSCULAR | Status: DC | PRN

## 2021-11-13 MED ORDER — AMLODIPINE BESYLATE 10 MG PO TABS
10.0000 mg | ORAL_TABLET | Freq: Every day | ORAL | Status: DC
  Administered 2021-11-13 – 2021-11-16 (×4): 10 mg via ORAL
  Filled 2021-11-13 (×2): qty 1
  Filled 2021-11-13: qty 2
  Filled 2021-11-13: qty 1

## 2021-11-13 MED ORDER — ACETAMINOPHEN 325 MG PO TABS
650.0000 mg | ORAL_TABLET | Freq: Four times a day (QID) | ORAL | Status: DC | PRN
  Administered 2021-11-15 (×2): 650 mg via ORAL
  Filled 2021-11-13 (×2): qty 2

## 2021-11-13 MED ORDER — HEPARIN SODIUM (PORCINE) 5000 UNIT/ML IJ SOLN
5000.0000 [IU] | Freq: Three times a day (TID) | INTRAMUSCULAR | Status: DC
  Administered 2021-11-13 – 2021-11-16 (×8): 5000 [IU] via SUBCUTANEOUS
  Filled 2021-11-13 (×8): qty 1

## 2021-11-13 MED ORDER — SODIUM CHLORIDE 0.9% FLUSH: 3.0000 mL | INTRAVENOUS | Status: DC | PRN

## 2021-11-13 MED ORDER — IPRATROPIUM BROMIDE 0.02 % IN SOLN: 0.5000 mg | Freq: Four times a day (QID) | RESPIRATORY_TRACT | Status: DC | PRN

## 2021-11-13 MED ORDER — ACETAMINOPHEN 650 MG RE SUPP: 650.0000 mg | Freq: Four times a day (QID) | RECTAL | Status: DC | PRN

## 2021-11-13 MED ORDER — SODIUM CHLORIDE 0.9% FLUSH: 3.0000 mL | Freq: Two times a day (BID) | INTRAVENOUS | Status: DC

## 2021-11-13 MED ORDER — ONDANSETRON HCL 4 MG/2ML IJ SOLN: 4.0000 mg | Freq: Four times a day (QID) | INTRAMUSCULAR | Status: DC | PRN

## 2021-11-13 MED ORDER — SODIUM CHLORIDE 0.9 % IV SOLN
510.0000 mg | Freq: Once | INTRAVENOUS | Status: AC
  Administered 2021-11-13: 510 mg via INTRAVENOUS
  Filled 2021-11-13: qty 17

## 2021-11-13 MED ORDER — LEVALBUTEROL HCL 0.63 MG/3ML IN NEBU: 0.6300 mg | INHALATION_SOLUTION | Freq: Four times a day (QID) | RESPIRATORY_TRACT | Status: DC | PRN

## 2021-11-13 MED ORDER — CARVEDILOL 12.5 MG PO TABS
12.5000 mg | ORAL_TABLET | Freq: Two times a day (BID) | ORAL | Status: DC
  Administered 2021-11-13 – 2021-11-14 (×3): 12.5 mg via ORAL
  Filled 2021-11-13 (×3): qty 1

## 2021-11-13 MED ORDER — ONDANSETRON HCL 4 MG PO TABS: 4.0000 mg | ORAL_TABLET | Freq: Four times a day (QID) | ORAL | Status: DC | PRN

## 2021-11-13 MED ORDER — TRAZODONE HCL 50 MG PO TABS
25.0000 mg | ORAL_TABLET | Freq: Every evening | ORAL | Status: DC | PRN
  Administered 2021-11-14: 25 mg via ORAL
  Filled 2021-11-13: qty 1

## 2021-11-13 MED ORDER — SODIUM CHLORIDE 0.9 % IV SOLN
1.0000 g | INTRAVENOUS | Status: DC
  Administered 2021-11-13 – 2021-11-14 (×2): 1 g via INTRAVENOUS
  Filled 2021-11-13 (×3): qty 10

## 2021-11-13 MED ORDER — INSULIN ASPART 100 UNIT/ML IJ SOLN
0.0000 [IU] | Freq: Three times a day (TID) | INTRAMUSCULAR | Status: DC
  Administered 2021-11-13: 3 [IU] via SUBCUTANEOUS
  Administered 2021-11-13 – 2021-11-14 (×3): 2 [IU] via SUBCUTANEOUS
  Administered 2021-11-14: 3 [IU] via SUBCUTANEOUS
  Administered 2021-11-15: 1 [IU] via SUBCUTANEOUS
  Administered 2021-11-15: 4 [IU] via SUBCUTANEOUS
  Administered 2021-11-15: 1 [IU] via SUBCUTANEOUS
  Administered 2021-11-16: 2 [IU] via SUBCUTANEOUS
  Administered 2021-11-16: 3 [IU] via SUBCUTANEOUS
  Filled 2021-11-13 (×2): qty 1

## 2021-11-13 MED ORDER — NITROGLYCERIN IN D5W 200-5 MCG/ML-% IV SOLN
5.0000 ug/min | INTRAVENOUS | Status: DC
  Administered 2021-11-13: 5 ug/min via INTRAVENOUS
  Filled 2021-11-13: qty 250

## 2021-11-13 MED ORDER — SODIUM BICARBONATE 650 MG PO TABS
650.0000 mg | ORAL_TABLET | Freq: Four times a day (QID) | ORAL | Status: DC
  Administered 2021-11-13 – 2021-11-15 (×8): 650 mg via ORAL
  Filled 2021-11-13 (×8): qty 1

## 2021-11-13 MED ORDER — LEVOTHYROXINE SODIUM 50 MCG PO TABS
50.0000 ug | ORAL_TABLET | Freq: Every day | ORAL | Status: DC
  Administered 2021-11-14 – 2021-11-16 (×3): 50 ug via ORAL
  Filled 2021-11-13 (×3): qty 1

## 2021-11-13 NOTE — Assessment & Plan Note (Signed)
Patient was placed on insulin sliding scale for glucose cover and monitoring.  Fasting glucose this am was 181.   Continue with statin therapy.

## 2021-11-13 NOTE — Assessment & Plan Note (Deleted)
-   History of CKD IIIb -Acute on chronic kidney disease -Severe volume overload, cardiorenal syndrome  Lab Results  Component Value Date   CREATININE 5.15 (H) 11/13/2021   CREATININE 3.75 (H) 06/17/2021   CREATININE 2.16 (H) 10/01/2020   -Avoiding all nephrotoxins : Including her naproxen, losartan, -Discussed the case with nephrologist Dr. Joelyn Oms Who recommended: Stop Naproxen, Losartan, Sodium bicarbonate.   And start  lasix 120 IV TID

## 2021-11-13 NOTE — Assessment & Plan Note (Signed)
-   Continue home dose Synthroid °

## 2021-11-13 NOTE — ED Notes (Signed)
ED TO INPATIENT HANDOFF REPORT  ED Nurse Name and Phone #: Olive Bass [phone]  S Name/Age/Gender Ana Blades D [name] 51 y.o. female Room/Bed: APA14/APA14  Code Status   Code Status: Full Code  Home/SNF/Other Home Patient oriented to: self, place, time, and situation Is this baseline? Yes    Triage Complete: Triage complete  Chief Complaint CHF (congestive heart failure) ([place]) [I50.9]  Triage Note Patient states [facility] for a couple of days and a cough for a while. Patient denies CP or other symptoms. Patient denis CHG but states she takes a fluid pill for her legs.    Allergies Allergies  Allergen Reactions   Ranitidine Rash   Ranitidine Hcl Rash    Level of Care/Admitting Diagnosis ED Disposition     ED Disposition  Admit   Condition  --   [place]: [place] [[id number]]  Level of Care: Telemetry Medical [104]  May admit patient to [facility] or [facility] if equivalent level of care is available:: No  Covid Evaluation: Asymptomatic - no recent exposure (last 10 days) testing not required  Diagnosis: CHF (congestive heart failure) [facility]) [[id number]]  Admitting Physician: Acquanetta Sit  Attending Physician: Deatra James [[id number]]  Certification:: I certify this patient will need inpatient services for at least 2 midnights          B Medical/Surgery History Past Medical History:  Diagnosis Date   CKD (chronic kidney disease)    Diabetes mellitus    diet controlled   Hypertension    Past Surgical History:  Procedure Laterality Date   CESAREAN SECTION  x4   CHOLECYSTECTOMY     EYE SURGERY     MASS EXCISION  10/27/2011   Procedure: EXCISION MASS;  Surgeon: Donato Heinz, MD;  Location: [facility];  Service: General;  Laterality: Right;   TUBAL LIGATION       A IV Location/Drains/Wounds Patient Lines/Drains/Airways Status     Active Line/Drains/Airways     Name Placement date Placement time Site Days    Peripheral IV 11/13/21 20 G Anterior;Left;Proximal Forearm 11/13/21  0908  Forearm  less than 1   Peripheral IV 11/13/21 22 G Posterior;Right Hand 11/13/21  1152  Hand  less than 1            Intake/Output Last 24 hours  Intake/Output Summary (Last 24 hours) at 11/13/2021 1750 Last data filed at 11/13/2021 1724 Gross per 24 hour  Intake 232.13 ml  Output --  Net 232.13 ml    Labs/Imaging Results for orders placed or performed during the hospital encounter of 11/13/21 (from the past 48 hour(s))  Brain natriuretic peptide     Status: Abnormal   Collection Time: 11/13/21  9:00 AM  Result Value Ref Range   B Natriuretic Peptide 546.0 (H) 0.0 - 100.0 pg/mL    Comment: Performed at [facility], [address] [place], [place] [address]  CBC     Status: Abnormal   Collection Time: 11/13/21  9:01 AM  Result Value Ref Range   WBC 10.5 4.0 - 10.5 K/uL   RBC 3.11 (L) 3.87 - 5.11 MIL/uL   Hemoglobin 8.8 (L) 12.0 - 15.0 g/dL   HCT 26.9 (L) 36.0 - 46.0 %   MCV 86.5 80.0 - 100.0 fL   MCH 28.3 26.0 - 34.0 pg   MCHC 32.7 30.0 - 36.0 g/dL   RDW 13.3 11.5 - 15.5 %   Platelets 334 150 - 400 K/uL  nRBC 0.0 0.0 - 0.2 %    Comment: Performed at [facility], [address], [place], [place] [address]  Troponin I (High Sensitivity)     Status: None   Collection Time: 11/13/21  9:01 AM  Result Value Ref Range   Troponin I (High Sensitivity) 11 <18 ng/L    Comment: (NOTE) Elevated high sensitivity troponin I (hsTnI) values and significant  changes across serial measurements may suggest ACS but many other  chronic and acute conditions are known to elevate hsTnI results.  Refer to the "Links" section for chest pain algorithms and additional  guidance. Performed at [facility], [address], [place], [place] [address]   Basic metabolic panel     Status: Abnormal   Collection Time: 11/13/21  9:01 AM  Result Value Ref Range   Sodium 136 135 - 145 mmol/L   Potassium 3.7 3.5 - 5.1 mmol/L    Chloride 107 98 - 111 mmol/L   CO2 19 (L) 22 - 32 mmol/L   Glucose, Bld 268 (H) 70 - 99 mg/dL    Comment: Glucose reference range applies only to samples taken after fasting for at least 8 hours.   BUN 40 (H) 6 - 20 mg/dL   Creatinine, Ser 5.15 (H) 0.44 - 1.00 mg/dL   Calcium 8.5 (L) 8.9 - 10.3 mg/dL   GFR, Estimated 10 (L) >60 mL/min    Comment: (NOTE) Calculated using the CKD-EPI Creatinine Equation (2021)    Anion gap 10 5 - 15    Comment: Performed at [facility], [address], [place], [place] [address]  Protime-INR     Status: None   Collection Time: 11/13/21  9:01 AM  Result Value Ref Range   Prothrombin Time 12.2 11.4 - 15.2 seconds   INR 0.9 0.8 - 1.2    Comment: (NOTE) INR goal varies based on device and disease states. Performed at [facility], [address], [place], [place] [address]   Blood gas, venous (at [facility] and [facility], not at [facility]     Status: Abnormal   Collection Time: 11/13/21  9:43 AM  Result Value Ref Range   pH, Ven 7.33 7.25 - 7.43   pCO2, Ven 39 (L) 44 - 60 mmHg   pO2, Ven 44 32 - 45 mmHg   Bicarbonate 20.6 20.0 - 28.0 mmol/L   Acid-base deficit 5.2 (H) 0.0 - 2.0 mmol/L   O2 Saturation 76.5 %   Patient temperature 36.5    Collection site LEFT ANTECUBITAL    Drawn by [id number]     Comment: Performed at [facility], [address], [place],  [address]  Urinalysis, Routine w reflex microscopic Urine, Clean Catch     Status: Abnormal   Collection Time: 11/13/21 10:07 AM  Result Value Ref Range   Color, Urine YELLOW YELLOW   APPearance CLOUDY (A) CLEAR   Specific Gravity, Urine 1.010 1.005 - 1.030   pH 6.0 5.0 - 8.0   Glucose, UA >=500 (A) NEGATIVE mg/dL   Hgb urine dipstick SMALL (A) NEGATIVE   Bilirubin Urine NEGATIVE NEGATIVE   Ketones, ur NEGATIVE NEGATIVE mg/dL   Protein, ur >=300 (A) NEGATIVE mg/dL   Nitrite NEGATIVE NEGATIVE   Leukocytes,Ua LARGE (A) NEGATIVE   RBC / HPF 11-20 0 - 5 RBC/hpf   WBC, UA >50 (H) 0 - 5 WBC/hpf   Bacteria, UA  FEW (A) NONE SEEN   Squamous Epithelial / LPF 6-10 0 - 5   WBC Clumps PRESENT  Comment: Performed at [facility], [address], [place], [place] [address]  Resp Panel by RT-PCR (Flu A&B, Covid) Urine, Clean Catch     Status: None   Collection Time: 11/13/21 10:10 AM   Specimen: Urine, Clean Catch; Nasal Swab  Result Value Ref Range   SARS Coronavirus 2 by RT PCR NEGATIVE NEGATIVE    Comment: (NOTE) SARS-CoV-2 target nucleic acids are NOT DETECTED.  The SARS-CoV-2 RNA is generally detectable in upper respiratory specimens during the acute phase of infection. The lowest concentration of SARS-CoV-2 viral copies this assay can detect is 138 copies/mL. A negative result does not preclude SARS-Cov-2 infection and should not be used as the sole basis for treatment or other patient management decisions. A negative result may occur with  improper specimen collection/handling, submission of specimen other than nasopharyngeal swab, presence of viral mutation(s) within the areas targeted by this assay, and inadequate number of viral copies(<138 copies/mL). A negative result must be combined with clinical observations, patient history, and epidemiological information. The expected result is Negative.  Fact Sheet for Patients:  EntrepreneurPulse.com.au  Fact Sheet for Healthcare Providers:  IncredibleEmployment.be  This test is no t yet approved or cleared by the [place] FDA and  has been authorized for detection and/or diagnosis of SARS-CoV-2 by FDA under an Emergency Use Authorization (EUA). This EUA will remain  in effect (meaning this test can be used) for the duration of the COVID-19 declaration under Section 564(b)(1) of the Act, 21 U.S.C.section 360bbb-3(b)(1), unless the authorization is terminated  or revoked sooner.       Influenza A by PCR NEGATIVE NEGATIVE   Influenza B by PCR NEGATIVE NEGATIVE    Comment: (NOTE) The Xpert Xpress  SARS-CoV-2/FLU/RSV plus assay is intended as an aid in the diagnosis of influenza from Nasopharyngeal swab specimens and should not be used as a sole basis for treatment. Nasal washings and aspirates are unacceptable for Xpert Xpress SARS-CoV-2/FLU/RSV testing.  Fact Sheet for Patients: EntrepreneurPulse.com.au  Fact Sheet for Healthcare Providers: IncredibleEmployment.be  This test is not yet approved or cleared by the [place] FDA and has been authorized for detection and/or diagnosis of SARS-CoV-2 by FDA under an Emergency Use Authorization (EUA). This EUA will remain in effect (meaning this test can be used) for the duration of the COVID-19 declaration under Section 564(b)(1) of the Act, 21 U.S.C. section 360bbb-3(b)(1), unless the authorization is terminated or revoked.  Performed at [facility], [address] [place], [place] [address]   TSH     Status: None   Collection Time: 11/13/21 10:13 AM  Result Value Ref Range   TSH 3.795 0.350 - 4.500 uIU/mL    Comment: Performed by a 3rd Generation assay with a functional sensitivity of <=0.01 uIU/mL. Performed at [facility], [address] [place], [place] [address]   Ferritin (Iron Binding Protein)     Status: None   Collection Time: 11/13/21 10:13 AM  Result Value Ref Range   Ferritin 81 11 - 307 ng/mL    Comment: Performed at [facility], [address] [place], [place] [address]  Iron and TIBC     Status: None   Collection Time: 11/13/21 10:13 AM  Result Value Ref Range   Iron 46 28 - 170 ug/dL   TIBC 345 250 - 450 ug/dL   Saturation Ratios 13 10.4 - 31.8 %   UIBC 299 ug/dL    Comment: Performed at [facility], [address] [place], [place] [address]  Lactate dehydrogenase     Status: Abnormal   Collection Time: 11/13/21 10:13 AM  Result Value Ref Range   LDH 194 (H) 98 - 192 U/L    Comment: Performed at [facility], [address], [place], [place] [address]  Troponin  I (High Sensitivity)     Status: None   Collection Time: 11/13/21 10:50 AM  Result Value Ref Range   Troponin I (High Sensitivity) 11 <18 ng/L    Comment: (NOTE) Elevated high sensitivity troponin I (hsTnI) values and significant  changes across serial measurements may suggest ACS but many other  chronic and acute conditions are known to elevate hsTnI results.  Refer to the "Links" section for chest pain algorithms and additional  guidance. Performed at [facility], [address], [place], [place] [address]   Magnesium     Status: None   Collection Time: 11/13/21 11:18 AM  Result Value Ref Range   Magnesium 2.0 1.7 - 2.4 mg/dL    Comment: Performed at [facility], [address], [place], [place] [address]  Phosphorus     Status: None   Collection Time: 11/13/21 11:18 AM  Result Value Ref Range   Phosphorus 4.6 2.5 - 4.6 mg/dL    Comment: Performed at [facility], [address], [place], [place] [address]  CBG monitoring, ED     Status: Abnormal   Collection Time: 11/13/21  5:16 PM  Result Value Ref Range   Glucose-Capillary 237 (H) 70 - 99 mg/dL    Comment: Glucose reference range applies only to samples taken after fasting for at least 8 hours.   DG Chest 2 View  Result Date: 11/13/2021 CLINICAL DATA:  51 year old female with shortness of breath and cough. EXAM: CHEST - 2 VIEW COMPARISON:  Chest radiograph 09/02/2020 and earlier. FINDINGS: Upright AP and lateral views of the chest at 0921 hours. Chronic cardiomegaly. Mildly lower lung volumes. There is trace pleural fluid in the fissures on the lateral view. Increased pulmonary vascularity compared to prior exams. No layering pleural effusion, pneumothorax, or consolidation. Visualized tracheal air column is within normal limits. No acute osseous abnormality identified. Stable cholecystectomy clips. Paucity of bowel gas in the visible abdomen. IMPRESSION: Chronic cardiomegaly with acute pulmonary interstitial edema. Electronically  Signed   By: Genevie Ann M.D.   On: 11/13/2021 09:31    Pending Labs Unresulted Labs (From admission, onward)     Start     Ordered   11/14/21 4270  Basic metabolic panel  Daily at 5am,   R      11/13/21 1120   11/14/21 0500  CBC  Daily at 5am,   R      11/13/21 1120   11/14/21 0500  Protime-INR  Tomorrow morning,   R        11/13/21 1120   11/14/21 0500  APTT  Tomorrow morning,   R        11/13/21 1120   11/13/21 1157  Urine Culture  (Urine Culture)  Add-on,   AD       Question:  Indication  Answer:  Dysuria   11/13/21 1156   11/13/21 1118  HIV Antibody (routine testing w rflx)  (HIV Antibody (Routine testing w reflex) panel)  Once,   R        11/13/21 1120            Vitals/Pain Today's Vitals   11/13/21 1630 11/13/21 1700 11/13/21 1724 11/13/21 1730  BP: (!) 152/75 (!) 150/68  Marland Kitchen)  152/71  Pulse: 81 (!) 151  78  Resp: (!) 32 (!) 22  (!) 24  Temp:   97.6 F (36.4 C)   TempSrc:   Oral   SpO2: 100% 100%  100%  Weight:      Height:      PainSc:        Isolation Precautions No active isolations  Medications Medications  heparin injection 5,000 Units (has no administration in time range)  sodium chloride flush (NS) 0.9 % injection 3 mL ( Intravenous Canceled Entry 11/13/21 1135)  acetaminophen (TYLENOL) tablet 650 mg (has no administration in time range)    Or  acetaminophen (TYLENOL) suppository 650 mg (has no administration in time range)  oxyCODONE (Oxy IR/ROXICODONE) immediate release tablet 5 mg (has no administration in time range)  HYDROmorphone (DILAUDID) injection 0.5-1 mg (has no administration in time range)  traZODone (DESYREL) tablet 25 mg (has no administration in time range)  senna-docusate (Senokot-S) tablet 1 tablet (has no administration in time range)  bisacodyl (DULCOLAX) EC tablet 5 mg (has no administration in time range)  sodium phosphate (FLEET) 7-19 GM/118ML enema 1 enema (has no administration in time range)  ondansetron (ZOFRAN) tablet 4 mg  (has no administration in time range)    Or  ondansetron (ZOFRAN) injection 4 mg (has no administration in time range)  ipratropium (ATROVENT) nebulizer solution 0.5 mg (has no administration in time range)  levalbuterol (XOPENEX) nebulizer solution 0.63 mg (has no administration in time range)  hydrALAZINE (APRESOLINE) injection 10 mg (has no administration in time range)  furosemide (LASIX) 120 mg in dextrose 5 % 50 mL IVPB (0 mg Intravenous Stopped 11/13/21 1724)  amLODipine (NORVASC) tablet 10 mg (10 mg Oral Given 11/13/21 1213)  carvedilol (COREG) tablet 12.5 mg (12.5 mg Oral Given 11/13/21 1213)  hydrALAZINE (APRESOLINE) tablet 50 mg (50 mg Oral Given 11/13/21 1213)  rosuvastatin (CRESTOR) tablet 40 mg (40 mg Oral Given 11/13/21 1724)  sodium bicarbonate tablet 650 mg (650 mg Oral Given 11/13/21 1724)  levothyroxine (SYNTHROID) tablet 50 mcg (has no administration in time range)  insulin aspart (novoLOG) injection 0-6 Units (2 Units Subcutaneous Given 11/13/21 1723)  cefTRIAXone (ROCEPHIN) 1 g in sodium chloride 0.9 % 100 mL IVPB (0 g Intravenous Stopped 11/13/21 1322)  nitroGLYCERIN (NITROSTAT) SL tablet 0.4 mg (0.4 mg Sublingual Given 11/13/21 1041)    Mobility walks Low fall risk   Focused Assessments Cardiac Assessment Handoff:    Lab Results  Component Value Date   TROPONINI <0.03 08/17/2015   Lab Results  Component Value Date   DDIMER 0.59 (H) 10/25/2014   Does the Patient currently have chest pain? No    R Recommendations: See Admitting Provider Note  Report given to:   Additional Notes:

## 2021-11-13 NOTE — ED Provider Notes (Signed)
[facility] EMERGENCY DEPARTMENT Provider Note   CSN: [id number] Arrival date & time: 11/13/21  0850     History  Chief Complaint  Patient presents with   Shortness of Breath    Ana [name] is a 51 y.o. female.  Patient as above with significant medical history as below, including CKD, DM, HTN who presents to the ED with complaint of dib, fatigue.  Patient notes over the past weeks has been having orthopnea, has to sleep with multiple pillows and unable to lie flat.  This morning she woke up with significant dyspnea, tachypnea.  No cough.  No fevers.  Worsening lower extremity edema over the past week.  Breathing worse with exertion/ unable to lie down in bed. A/w mild chest tightness.    No nausea or vomiting.  No falls.  Compliant with home medication regimen, no history of COPD or asthma, does not smoke cigarettes.  Dr Dorris Carnes cardiology Prior echo 7/22 with LVH and moderate pericardial effusion, EF 65%,     Past Medical History:  Diagnosis Date   CKD (chronic kidney disease)    Diabetes mellitus    diet controlled   Hypertension     Past Surgical History:  Procedure Laterality Date   CESAREAN SECTION  x4   CHOLECYSTECTOMY     EYE SURGERY     MASS EXCISION  10/27/2011   Procedure: EXCISION MASS;  Surgeon: Donato Heinz, MD;  Location: [facility];  Service: General;  Laterality: Right;   TUBAL LIGATION       The history is provided by the patient. No language interpreter was used.  Shortness of Breath Associated symptoms: chest pain   Associated symptoms: no abdominal pain, no cough, no fever, no headaches, no rash and no vomiting        Home Medications Prior to Admission medications   Medication Sig Start Date End Date Taking? Authorizing Provider  amLODipine (NORVASC) 10 MG tablet TAKE 1 TABLET BY MOUTH DAILY Patient taking differently: Take 10 mg by mouth daily. 07/29/21  Yes Paseda, Dewaine Conger, FNP  carvedilol (COREG) 12.5 MG tablet Take 1 tablet  (12.5 mg total) by mouth 2 (two) times daily. 01/12/21 11/13/21 Yes Paseda, Dewaine Conger, FNP  furosemide (LASIX) 20 MG tablet TAKE 1 TABLET(20 MG) BY MOUTH DAILY Patient taking differently: Take 20 mg by mouth daily. 10/19/21  Yes Paseda, Dewaine Conger, FNP  levothyroxine (SYNTHROID) 50 MCG tablet TAKE 1 TABLET(50 MCG) BY MOUTH DAILY Patient taking differently: Take 50 mcg by mouth daily. 03/14/21  Yes Paseda, Dewaine Conger, FNP  sodium bicarbonate 650 MG tablet Take 650 mg by mouth 4 (four) times daily.   Yes [provider]  glipiZIDE (GLUCOTROL) 10 MG tablet Take 1 tablet(10mg )  by mouth twice daily Patient not taking: Reported on 11/13/2021 10/19/21   Renee Rival, FNP  hydrALAZINE (APRESOLINE) 50 MG tablet Take 1 tablet (50 mg total) by mouth 2 (two) times daily. Patient not taking: Reported on 11/13/2021 01/24/21   Renee Rival, FNP  losartan (COZAAR) 100 MG tablet TAKE 1 TABLET(100 MG) BY MOUTH DAILYTAKE 1 TABLET(100 MG) BY MOUTH DAILY Patient not taking: Reported on 11/13/2021 10/19/21   Renee Rival, FNP  methocarbamol (ROBAXIN) 500 MG tablet Take 1 tablet (500 mg total) by mouth 2 (two) times daily as needed for muscle spasms. Patient not taking: Reported on 11/13/2021 09/30/21   Noemi Chapel, MD  naproxen (NAPROSYN) 500 MG tablet Take 1 tablet (500 mg total) by  mouth 2 (two) times daily with a meal. Patient not taking: Reported on 11/13/2021 09/30/21   Noemi Chapel, MD  rosuvastatin (CRESTOR) 40 MG tablet Take 1 tablet (40 mg total) by mouth daily with supper. Patient not taking: Reported on 11/13/2021 06/21/21 06/16/22  Fay Records, MD      Allergies    Ranitidine and Ranitidine hcl    Review of Systems   Review of Systems  Constitutional:  Positive for fatigue. Negative for activity change and fever.  HENT:  Negative for facial swelling and trouble swallowing.   Eyes:  Negative for discharge and redness.  Respiratory:  Positive for shortness of breath. Negative  for cough.   Cardiovascular:  Positive for chest pain and leg swelling. Negative for palpitations.  Gastrointestinal:  Negative for abdominal pain, nausea and vomiting.  Genitourinary:  Negative for dysuria and flank pain.  Musculoskeletal:  Negative for back pain and gait problem.  Skin:  Negative for pallor and rash.  Neurological:  Negative for syncope and headaches.    Physical Exam Updated Vital Signs BP 128/66   Pulse 77   Temp 97.9 F (36.6 C) (Oral)   Resp (!) 24   Ht 5' (1.524 m)   Wt 90.7 kg   LMP 09/22/2019 (Approximate)   SpO2 100%   BMI 39.06 kg/m  Physical Exam Vitals and nursing note reviewed.  Constitutional:      General: She is in acute distress.     Appearance: Normal appearance. She is well-developed. She is obese.  HENT:     Head: Normocephalic and atraumatic.     Right Ear: External ear normal.     Left Ear: External ear normal.     Nose: Nose normal.     Mouth/Throat:     Mouth: Mucous membranes are moist.  Eyes:     General: No scleral icterus.       Right eye: No discharge.        Left eye: No discharge.  Cardiovascular:     Rate and Rhythm: Normal rate and regular rhythm.     Pulses: Normal pulses.     Heart sounds: Normal heart sounds.  Pulmonary:     Effort: Tachypnea present. No accessory muscle usage.     Breath sounds: No stridor. Decreased breath sounds and rales present.  Abdominal:     General: Abdomen is flat.     Palpations: Abdomen is soft.     Tenderness: There is no abdominal tenderness.  Musculoskeletal:        General: Normal range of motion.     Cervical back: Normal range of motion.     Right lower leg: Edema present.     Left lower leg: Edema present.     Comments: 2+ pitting edema to knee  Skin:    General: Skin is warm and dry.     Capillary Refill: Capillary refill takes less than 2 seconds.  Neurological:     Mental Status: She is alert and oriented to person, place, and time.     GCS: GCS eye subscore is 4.  GCS verbal subscore is 5. GCS motor subscore is 6.  Psychiatric:        Mood and Affect: Mood normal.        Behavior: Behavior normal.     ED Results / Procedures / Treatments   Labs (all labs ordered are listed, but only abnormal results are displayed) Labs Reviewed  CBC - Abnormal; Notable for the following  components:      Result Value   RBC 3.11 (*)    Hemoglobin 8.8 (*)    HCT 26.9 (*)    All other components within normal limits  BASIC METABOLIC PANEL - Abnormal; Notable for the following components:   CO2 19 (*)    Glucose, Bld 268 (*)    BUN 40 (*)    Creatinine, Ser 5.15 (*)    Calcium 8.5 (*)    GFR, Estimated 10 (*)    All other components within normal limits  BRAIN NATRIURETIC PEPTIDE - Abnormal; Notable for the following components:   B Natriuretic Peptide 546.0 (*)    All other components within normal limits  BLOOD GAS, VENOUS - Abnormal; Notable for the following components:   pCO2, Ven 39 (*)    Acid-base deficit 5.2 (*)    All other components within normal limits  URINALYSIS, ROUTINE W REFLEX MICROSCOPIC - Abnormal; Notable for the following components:   APPearance CLOUDY (*)    Glucose, UA >=500 (*)    Hgb urine dipstick SMALL (*)    Protein, ur >=300 (*)    Leukocytes,Ua LARGE (*)    WBC, UA >50 (*)    Bacteria, UA FEW (*)    All other components within normal limits  LACTATE DEHYDROGENASE - Abnormal; Notable for the following components:   LDH 194 (*)    All other components within normal limits  RESP PANEL BY RT-PCR (FLU A&B, COVID) ARPGX2  URINE CULTURE  TSH  FERRITIN  IRON AND TIBC  MAGNESIUM  PHOSPHORUS  PROTIME-INR  HIV ANTIBODY (ROUTINE TESTING W REFLEX)  TROPONIN I (HIGH SENSITIVITY)  TROPONIN I (HIGH SENSITIVITY)    EKG EKG Interpretation  Date/Time:  Sunday November 13 2021 09:30:02 EDT Ventricular Rate:  95 PR Interval:  187 QRS Duration: 84 QT Interval:  371 QTC Calculation: 467 R Axis:   95 Text  Interpretation: Sinus rhythm Borderline right axis deviation Baseline wander in lead(s) V4 V6 No old tracing to compare no stemi Confirmed by Wynona Dove ([id number]) on 11/13/2021 10:39:31 AM  Radiology DG Chest 2 View  Result Date: 11/13/2021 CLINICAL DATA:  51 year old female with shortness of breath and cough. EXAM: CHEST - 2 VIEW COMPARISON:  Chest radiograph 09/02/2020 and earlier. FINDINGS: Upright AP and lateral views of the chest at 0921 hours. Chronic cardiomegaly. Mildly lower lung volumes. There is trace pleural fluid in the fissures on the lateral view. Increased pulmonary vascularity compared to prior exams. No layering pleural effusion, pneumothorax, or consolidation. Visualized tracheal air column is within normal limits. No acute osseous abnormality identified. Stable cholecystectomy clips. Paucity of bowel gas in the visible abdomen. IMPRESSION: Chronic cardiomegaly with acute pulmonary interstitial edema. Electronically Signed   By: Genevie Ann M.D.   On: 11/13/2021 09:31    Procedures .Critical Care  Performed by: Jeanell Sparrow, DO Authorized by: Jeanell Sparrow, DO   Critical care provider statement:    Critical care time (minutes):  69   Critical care time was exclusive of:  Separately billable procedures and treating other patients   Critical care was necessary to treat or prevent imminent or life-threatening deterioration of the following conditions:  Respiratory failure and renal failure   Critical care was time spent personally by me on the following activities:  Development of treatment plan with patient or surrogate, discussions with consultants, evaluation of patient's response to treatment, examination of patient, ordering and review of laboratory studies, ordering and review of radiographic  studies, ordering and performing treatments and interventions, pulse oximetry, re-evaluation of patient's condition, review of old charts and obtaining history from patient or surrogate   Care  discussed with: admitting provider       Medications Ordered in ED Medications  heparin injection 5,000 Units (has no administration in time range)  sodium chloride flush (NS) 0.9 % injection 3 mL ( Intravenous Canceled Entry 11/13/21 1135)  acetaminophen (TYLENOL) tablet 650 mg (has no administration in time range)    Or  acetaminophen (TYLENOL) suppository 650 mg (has no administration in time range)  oxyCODONE (Oxy IR/ROXICODONE) immediate release tablet 5 mg (has no administration in time range)  HYDROmorphone (DILAUDID) injection 0.5-1 mg (has no administration in time range)  traZODone (DESYREL) tablet 25 mg (has no administration in time range)  senna-docusate (Senokot-S) tablet 1 tablet (has no administration in time range)  bisacodyl (DULCOLAX) EC tablet 5 mg (has no administration in time range)  sodium phosphate (FLEET) 7-19 GM/118ML enema 1 enema (has no administration in time range)  ondansetron (ZOFRAN) tablet 4 mg (has no administration in time range)    Or  ondansetron (ZOFRAN) injection 4 mg (has no administration in time range)  ipratropium (ATROVENT) nebulizer solution 0.5 mg (has no administration in time range)  levalbuterol (XOPENEX) nebulizer solution 0.63 mg (has no administration in time range)  hydrALAZINE (APRESOLINE) injection 10 mg (has no administration in time range)  furosemide (LASIX) 120 mg in dextrose 5 % 50 mL IVPB (0 mg Intravenous Stopped 11/13/21 1403)  amLODipine (NORVASC) tablet 10 mg (10 mg Oral Given 11/13/21 1213)  carvedilol (COREG) tablet 12.5 mg (12.5 mg Oral Given 11/13/21 1213)  hydrALAZINE (APRESOLINE) tablet 50 mg (50 mg Oral Given 11/13/21 1213)  rosuvastatin (CRESTOR) tablet 40 mg (has no administration in time range)  sodium bicarbonate tablet 650 mg (650 mg Oral Given 11/13/21 1408)  levothyroxine (SYNTHROID) tablet 50 mcg (has no administration in time range)  insulin aspart (novoLOG) injection 0-6 Units (3 Units Subcutaneous Given  11/13/21 1214)  cefTRIAXone (ROCEPHIN) 1 g in sodium chloride 0.9 % 100 mL IVPB (0 g Intravenous Stopped 11/13/21 1322)  nitroGLYCERIN (NITROSTAT) SL tablet 0.4 mg (0.4 mg Sublingual Given 11/13/21 1041)    ED Course/ Medical Decision Making/ A&P Clinical Course as of 11/13/21 1606  Sun Nov 13, 2021  1011 Hemoglobin(!): 8.8 Mildly reduced from prior, no brbpr or melena, she has CKD, MCV 86, favor anemia of chronic dz; recommend trend [SG]    Clinical Course User Index [SG] Jeanell Sparrow, DO                           Medical Decision Making Amount and/or Complexity of Data Reviewed Labs: ordered. Decision-making details documented in ED Course. Radiology: ordered.  Risk Prescription drug management. Decision regarding hospitalization.   This patient presents to the ED with chief complaint(s) of DIB with pertinent past medical history of ckd, as above which further complicates the presenting complaint. The complaint involves an extensive differential diagnosis and also carries with it a high risk of complications and morbidity.    In my evaluation of this patient's dyspnea my DDx includes, but is not limited to, pneumonia, pulmonary embolism, pneumothorax, pulmonary edema, metabolic acidosis, asthma, COPD, cardiac cause, anemia, anxiety, etc.    . Serious etiologies were considered.   The initial plan is to screening labs/imaging/ nasal cannula   Additional history obtained: Additional history obtained from family Records reviewed  Primary Care Documents and prior labs/imaging, home meds   Independent labs interpretation:  The following labs were independently interpreted:  BNP 546, Creatinine 5.15, baseline around 2-3 which has history of CKD UA pending  Independent visualization of imaging: - I independently visualized the following imaging with scope of interpretation limited to determining acute life threatening conditions related to emergency care: Chest x-ray, which  revealed interstitial edema  Cardiac monitoring was reviewed and interpreted by myself which shows NSR  Treatment and Reassessment: Patient required supplemental oxygen, 3-4 L, no home oxygen requirement Nitroglycerin PO and GTT Bumex  >> BP improving, respiratory status improving   Consultation: - Consulted or discussed management/test interpretation w/ external professional:   Consideration for admission or further workup: Admission was considered   51 year old female history of CKD to the ED with dyspnea.  Orthopnea, increased leg swelling, exertional dyspnea.  Hypoxic on arrival, requiring 4 L nasal cannula.  Resp status improved with supplemental oxygen.  BNP elevated over 500, systemic evidence of fluid overload including pulmonary edema on imaging.  Give nitro, continue supplemental oxygen.  Give Bumex.  Creatinine is elevated.  Recommend judicious diuresis given worsening renal function in setting of CKD.  Her K is stable currently, if Cr does not improve may need to be eval for HD. She has AKI.  Concern for acute CHF, pulm edema, AKI in setting of CKD. Will admit. Pt agreeable  D/w Dr Roger Shelter who accepts pt for admission   Social Determinants of health: Social History   Tobacco Use   Smoking status: Never   Smokeless tobacco: Never  Vaping Use   Vaping Use: Never used  Substance Use Topics   Alcohol use: Not Currently    Comment: occ   Drug use: No            Final Clinical Impression(s) / ED Diagnoses Final diagnoses:  Acute congestive heart failure, unspecified heart failure type ([place])  Acute pulmonary edema ([place])  Acute respiratory failure with hypoxia [facility])    Rx / DC Orders ED Discharge Orders     None         Jeanell Sparrow, DO 11/13/21 1608

## 2021-11-13 NOTE — Assessment & Plan Note (Addendum)
-   Likely due to volume overload, cardiorenal syndrome -Continue aggressive diuresing -In ED required up to 4 L of oxygen by nasal cannula currently on 2 L satting 100% -Continue supplemental oxygen, -Monitoring closely -Lasix and nitro drip glycerin drip was started in ED

## 2021-11-13 NOTE — ED Notes (Signed)
Family at the bedside to verbalize transfer

## 2021-11-13 NOTE — Assessment & Plan Note (Signed)
-   Acute diastolic congestive heart failure -Since to be developing cardiorenal syndrome -Shortness of breath with vascular congestion on x-ray BNP (last 3 results) Recent Labs    11/13/21 0900  BNP 546.0*    Filed Weights   11/13/21 0916  Weight: 90.7 kg   -Monitoring I's and O's -Admitting the patient to [facility], patient will benefit from heart failure team evaluation and treatment plan (Patient a team, also went through answering service did not get a call back yet)  -Per nephrology recommendation Dr. Joelyn Oms starting Lasix 120 mg IV 3 times daily  -Last echocardiogram 09/16/2020: Reviewed LEJF: 60 to 65%. Moderate LVH Left ventricular diastolic  parameters were normal. There is normal pulmonary artery systolic pressure. 3. Left atrial size was mildly dilated. 4. Right atrial size was mildly dilated. 5. Small to moderate pericardial effusion.   -Order for repeat echocardiogram has been placed if cardiology feels not necessary may be canceled

## 2021-11-13 NOTE — Assessment & Plan Note (Signed)
Antibiotic therapy with augmentin Now completed antibiotic therapy.

## 2021-11-13 NOTE — H&P (Signed)
History and Physical   [name]                            PCP: System, Provider Not In                    DOB: Jun 30, 1970            DOA: 11/13/2021 [id number]             DOS: 11/13/2021, 12:05 PM  System, Provider Not In  Patient coming from:   HOME  I have personally reviewed patient's medical records, in electronic medical records, including:  [place] [facility], and care everywhere.    Chief Complaint:   Chief Complaint  Patient presents with   Shortness of Breath    History of present illness:     Ana [name] is a 51 year old female with extensive history of HTN, HLD, dCHF, DM 2, CKD 3b, presenting today ED with chief complaint of shortness of breath, and fatigue.  Patient reports that her symptoms started over a week ago and progressively has been getting worse.  She has difficulty sleeping, requiring multiple pillows now unable to lie flat.  She woke up this morning with significant shortness of breath and increased work of breathing.  But denies any cough or fever/chills.  Denies of your recent illnesses.  Also over the past week noticed worsening lower extremity swelling.    ED Course:   Blood pressure (!) 170/78, pulse 75, temperature 97.6 F (36.4 C), temperature source Oral, resp. rate (!) 25, height 5' (1.524 m), weight 90.7 kg, on 4 L of oxygen SpO2 100 %. Abnormal labs; BUN 40, creatinine 5.15, calcium 8.5, troponin 11,  BNP 546 Chest x-rayIMPRESSION: Chronic cardiomegaly with acute pulmonary interstitial edema. UA positive for leukocyte esterase UA WBC >50   Patient seem to be in cardiorenal syndrome, with volume overload: Discussed the case with nephrologist Dr. Joelyn Oms, agreed the patient to be admitted to [facility] for close monitoring and heart failure team to be consulted  -I have left a message with answering service for heart failure team, also paged We have not got a call back yet   Patient Denies having: Fever, Chills,  Cough,Chest Pain, Abd pain, N/V/D, headache, dizziness, lightheadedness,  Dysuria, Joint pain, rash, open wounds     Review of Systems: As per HPI, otherwise 10 point review of systems were negative.   ----------------------------------------------------------------------------------------------------------------------  Allergies  Allergen Reactions   Ranitidine Rash   Ranitidine Hcl Rash    Home MEDs:  Prior to Admission medications   Medication Sig Start Date End Date Taking? Authorizing Provider  amLODipine (NORVASC) 10 MG tablet TAKE 1 TABLET BY MOUTH DAILY Patient taking differently: Take 10 mg by mouth daily. 07/29/21  Yes Paseda, Dewaine Conger, FNP  carvedilol (COREG) 12.5 MG tablet Take 1 tablet (12.5 mg total) by mouth 2 (two) times daily. 01/12/21 11/13/21 Yes Paseda, Dewaine Conger, FNP  furosemide (LASIX) 20 MG tablet TAKE 1 TABLET(20 MG) BY MOUTH DAILY Patient taking differently: Take 20 mg by mouth daily. 10/19/21  Yes Paseda, Dewaine Conger, FNP  levothyroxine (SYNTHROID) 50 MCG tablet TAKE 1 TABLET(50 MCG) BY MOUTH DAILY Patient taking differently: Take 50 mcg by mouth daily. 03/14/21  Yes Paseda, Dewaine Conger, FNP  sodium bicarbonate 650 MG tablet Take 650 mg by mouth 4 (four) times daily.   Yes [provider]  glipiZIDE (GLUCOTROL) 10 MG  tablet Take 1 tablet(10mg )  by mouth twice daily Patient not taking: Reported on 11/13/2021 10/19/21   Renee Rival, FNP  hydrALAZINE (APRESOLINE) 50 MG tablet Take 1 tablet (50 mg total) by mouth 2 (two) times daily. Patient not taking: Reported on 11/13/2021 01/24/21   Renee Rival, FNP  losartan (COZAAR) 100 MG tablet TAKE 1 TABLET(100 MG) BY MOUTH DAILYTAKE 1 TABLET(100 MG) BY MOUTH DAILY Patient not taking: Reported on 11/13/2021 10/19/21   Renee Rival, FNP  methocarbamol (ROBAXIN) 500 MG tablet Take 1 tablet (500 mg total) by mouth 2 (two) times daily as needed for muscle spasms. Patient not taking: Reported on  11/13/2021 09/30/21   Noemi Chapel, MD  naproxen (NAPROSYN) 500 MG tablet Take 1 tablet (500 mg total) by mouth 2 (two) times daily with a meal. Patient not taking: Reported on 11/13/2021 09/30/21   Noemi Chapel, MD  rosuvastatin (CRESTOR) 40 MG tablet Take 1 tablet (40 mg total) by mouth daily with supper. Patient not taking: Reported on 11/13/2021 06/21/21 06/16/22  Fay Records, MD    PRN MEDs: acetaminophen **OR** acetaminophen, bisacodyl, hydrALAZINE, HYDROmorphone (DILAUDID) injection, ipratropium, levalbuterol, ondansetron **OR** ondansetron (ZOFRAN) IV, oxyCODONE, senna-docusate, sodium phosphate, traZODone  Past Medical History:  Diagnosis Date   CKD (chronic kidney disease)    Diabetes mellitus    diet controlled   Hypertension     Past Surgical History:  Procedure Laterality Date   CESAREAN SECTION  x4   CHOLECYSTECTOMY     EYE SURGERY     MASS EXCISION  10/27/2011   Procedure: EXCISION MASS;  Surgeon: Donato Heinz, MD;  Location: [facility];  Service: General;  Laterality: Right;   TUBAL LIGATION       reports that she has never smoked. She has never used smokeless tobacco. She reports that she does not currently use alcohol. She reports that she does not use drugs.   Family History  Problem Relation Age of Onset   Kidney failure Mother    Diabetes Mother    Kidney disease Mother    Kidney failure Father    Kidney disease Father    Kidney failure Brother    Diabetes Brother    Diabetes Brother     Physical Exam:   Vitals:   11/13/21 1120 11/13/21 1130 11/13/21 1140 11/13/21 1150  BP: (!) 170/72 (!) 170/78 (!) 169/85 (!) 180/76  Pulse: 76 75 74 75  Resp: (!) 30 (!) 25 (!) 23 19  Temp:      TempSrc:      SpO2: 100% 100% 100% 100%  Weight:      Height:       Constitutional: NAD, calm, comfortable Eyes: PERRL, lids and conjunctivae normal ENMT: Mucous membranes are moist. Posterior pharynx clear of any exudate or lesions.Normal dentition.  Neck: normal,  supple, no masses, no thyromegaly Respiratory: clear to auscultation bilaterally, no wheezing, no crackles. Normal respiratory effort. No accessory muscle use.  Cardiovascular: Regular rate and rhythm, no murmurs / rubs / gallops. No extremity edema. 2+ pedal pulses. No carotid bruits.  Abdomen: no tenderness, no masses palpated. No hepatosplenomegaly. Bowel sounds positive.  Musculoskeletal: no clubbing / cyanosis. No joint deformity upper and lower extremities. Good ROM, no contractures. Normal muscle tone.  +2 pitting edema Neurologic: CN II-XII grossly intact. Sensation intact, DTR normal. Strength 5/5 in all 4.  Psychiatric: Normal judgment and insight. Alert and oriented x 3. Normal mood.  Skin: no rashes, lesions, ulcers. No induration  Wounds: per nursing documentation         Labs on admission:    I have personally reviewed following labs and imaging studies  CBC: Recent Labs  Lab 11/13/21 0901  WBC 10.5  HGB 8.8*  HCT 26.9*  MCV 86.5  PLT 335   Basic Metabolic Panel: Recent Labs  Lab 11/13/21 0901  NA 136  K 3.7  CL 107  CO2 19*  GLUCOSE 268*  BUN 40*  CREATININE 5.15*  CALCIUM 8.5*   GFR: Estimated Creatinine Clearance: 13 mL/min (A) (by C-G formula based on SCr of 5.15 mg/dL (H)). Liver Function Tests: No results for input(s): "AST", "ALT", "ALKPHOS", "BILITOT", "PROT", "ALBUMIN" in the last 168 hours. No results for input(s): "LIPASE", "AMYLASE" in the last 168 hours. No results for input(s): "AMMONIA" in the last 168 hours. Coagulation Profile: Recent Labs  Lab 11/13/21 0901  INR 0.9    Recent Labs    11/13/21 1013  TSH 3.795   Anemia Panel: Recent Labs    11/13/21 1013  FERRITIN 81  TIBC 345  IRON 46   Urine analysis:    Component Value Date/Time   COLORURINE YELLOW 11/13/2021 1007   APPEARANCEUR CLOUDY (A) 11/13/2021 1007   LABSPEC 1.010 11/13/2021 1007   PHURINE 6.0 11/13/2021 1007   GLUCOSEU >=500 (A) 11/13/2021 1007    HGBUR SMALL (A) 11/13/2021 1007   BILIRUBINUR NEGATIVE 11/13/2021 1007   BILIRUBINUR negative 01/07/2013 1149   KETONESUR NEGATIVE 11/13/2021 1007   PROTEINUR >=300 (A) 11/13/2021 1007   UROBILINOGEN 0.2 10/25/2014 2235   NITRITE NEGATIVE 11/13/2021 1007   LEUKOCYTESUR LARGE (A) 11/13/2021 1007    Last A1C:  Lab Results  Component Value Date   HGBA1C 8.3 (H) 06/17/2021     Radiologic Exams on Admission:   DG Chest 2 View  Result Date: 11/13/2021 CLINICAL DATA:  51 year old female with shortness of breath and cough. EXAM: CHEST - 2 VIEW COMPARISON:  Chest radiograph 09/02/2020 and earlier. FINDINGS: Upright AP and lateral views of the chest at 0921 hours. Chronic cardiomegaly. Mildly lower lung volumes. There is trace pleural fluid in the fissures on the lateral view. Increased pulmonary vascularity compared to prior exams. No layering pleural effusion, pneumothorax, or consolidation. Visualized tracheal air column is within normal limits. No acute osseous abnormality identified. Stable cholecystectomy clips. Paucity of bowel gas in the visible abdomen. IMPRESSION: Chronic cardiomegaly with acute pulmonary interstitial edema. Electronically Signed   By: Genevie Ann M.D.   On: 11/13/2021 09:31    EKG:   Independently reviewed.  Orders placed or performed during the hospital encounter of 11/13/21   ED EKG   ED EKG   EKG 12-Lead   EKG 12-Lead   EKG 12-Lead   ---------------------------------------------------------------------------------------------------------------------------------------    Assessment / Plan:   Principal Problem:   CHF (congestive heart failure) (HCC) Active Problems:   Acute renal failure superimposed on stage 3b chronic kidney disease (HCC)   Acute respiratory failure with hypoxia (HCC)   Sepsis secondary to UTI ([place])   Uncontrolled type 2 diabetes mellitus with hyperglycemia (HCC)   Essential hypertension, benign   Mixed hyperlipidemia    Hypothyroidism, adult   Vitamin D deficiency disease   Assessment and Plan: * CHF (congestive heart failure) (HCC) - Acute diastolic congestive heart failure -Since to be developing cardiorenal syndrome -Shortness of breath with vascular congestion on x-ray BNP (last 3 results) Recent Labs    11/13/21 0900  BNP 546.0*    [employer]  11/13/21 0916  Weight: 90.7 kg   -Monitoring I's and O's -Admitting the patient to [facility], patient will benefit from heart failure team evaluation and treatment plan (Patient a team, also went through answering service did not get a call back yet)  -Per nephrology recommendation Dr. Joelyn Oms starting Lasix 120 mg IV 3 times daily  -Last echocardiogram 09/16/2020: Reviewed LEJF: 60 to 65%. Moderate LVH Left ventricular diastolic  parameters were normal. There is normal pulmonary artery systolic pressure. 3. Left atrial size was mildly dilated. 4. Right atrial size was mildly dilated. 5. Small to moderate pericardial effusion.   -Order for repeat echocardiogram has been placed if cardiology feels not necessary may be canceled  Acute respiratory failure with hypoxia ([place]) - Likely due to volume overload, cardiorenal syndrome -Continue aggressive diuresing -In ED required up to 4 L of oxygen by nasal cannula currently on 2 L satting 100% -Continue supplemental oxygen, -Monitoring closely  Acute renal failure superimposed on stage 3b chronic kidney disease ([place]) - History of CKD IIIb -Acute on chronic kidney disease -Severe volume overload, cardiorenal syndrome  Lab Results  Component Value Date   CREATININE 5.15 (H) 11/13/2021   CREATININE 3.75 (H) 06/17/2021   CREATININE 2.16 (H) 10/01/2020   -Avoiding all nephrotoxins : Including her naproxen, losartan, -Discussed the case with nephrologist Dr. Joelyn Oms Who recommended: Stop Naproxen, Losartan, Sodium bicarbonate.   And start  lasix 120 IV TID   Sepsis secondary to UTI ([place]) - UA  indicative of UTI: Positive leukocyte esterase UA WBC> 50 -Initiating IV antibiotics of Rocephin -Urine cultures >>>  Hypothyroidism, adult - Continue home dose Synthroid  Mixed hyperlipidemia - Continue statins- rosuvastatin  Essential hypertension, benign - Accelerated hypertension, as high as 201/83--- currently 170/78 -Initiating Lasix -As needed IV hydralazine -Resuming home medication of Norvasc, Coreg, hydralazine, -Holding losartan due to A on CKD   Uncontrolled type 2 diabetes mellitus with hyperglycemia ([place]) - Holding home medication of glipizide -We will check CBG QA CHS with SSI coverage              Consults called: Nephrology (Please consult heart failure team --I have paged and left a message with answering service did not get a call back) this patient will benefit from heart failure team -------------------------------------------------------------------------------------------------------------------------------------------- DVT prophylaxis:  heparin injection 5,000 Units Start: 11/13/21 2200 TED hose Start: 11/13/21 1118 SCDs Start: 11/13/21 1118   Code Status:   Code Status: Full Code   Admission status: Patient will be admitted as Inpatient, with a greater than 2 midnight length of stay. Level of care: Telemetry Medical   Family Communication:  none at bedside  (The above findings and plan of care has been discussed with patient in detail, the patient expressed understanding and agreement of above plan)  --------------------------------------------------------------------------------------------------------------------------------------------------  Disposition Plan:  Anticipated 1-2 days Status is: Inpatient Remains inpatient appropriate because: Will need aggressive IV diuresing, management of cardiorenal syndrome needing consulting cardiology and nephrology due to volume overload and acute on chronic renal  failure     ----------------------------------------------------------------------------------------------------------------------------------------------------  Time spent: > than  75  Min.   SIGNED: Deatra James, MD, FHM. [facility],  Pager (Please use [email or web address] to page to text)  If 7PM-7AM, please contact night-coverage [email or web address],  11/13/2021, 12:05 PM
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
## 2021-11-13 NOTE — Hospital Course (Signed)
  Ana [name] is a 51 year old female with extensive history of HTN, HLD, dCHF, DM 2, CKD 3b, presenting today ED with chief complaint of shortness of breath, and fatigue.  Patient reports that her symptoms started over a week ago and progressively has been getting worse.  She has difficulty sleeping, requiring multiple pillows now unable to lie flat.  She woke up this morning with significant shortness of breath and increased work of breathing.  But denies any cough or fever/chills.  Denies of your recent illnesses.  Also over the past week noticed worsening lower extremity swelling.    ED Course:   Blood pressure (!) 170/78, pulse 75, temperature 97.6 F (36.4 C), temperature source Oral, resp. rate (!) 25, height 5' (1.524 m), weight 90.7 kg, on 4 L of oxygen SpO2 100 %. Abnormal labs; BUN 40, creatinine 5.15, calcium 8.5, troponin 11,  BNP 546 Chest x-rayIMPRESSION: Chronic cardiomegaly with acute pulmonary interstitial edema. UA positive for leukocyte esterase UA WBC >50   Patient seem to be in cardiorenal syndrome, with volume overload: Discussed the case with nephrologist Dr. Joelyn Oms, agreed the patient to be admitted to [facility] for close monitoring and heart failure team to be consulted  -I have left a message with answering service for heart failure team, also paged We have not got a call back yet

## 2021-11-13 NOTE — Assessment & Plan Note (Signed)
Continue blood pressure control with amlodipine, carvedilol and hydralazine

## 2021-11-13 NOTE — Assessment & Plan Note (Signed)
-   Continue statins- rosuvastatin 

## 2021-11-13 NOTE — ED Notes (Signed)
Pt is yelling at staff and claiming she is hot. Tried to fan the pt, pt says it does nothing.

## 2021-11-13 NOTE — ED Triage Notes (Signed)
Patient states [facility] for a couple of days and a cough for a while. Patient denies CP or other symptoms. Patient denis CHG but states she takes a fluid pill for her legs.

## 2021-11-14 ENCOUNTER — Inpatient Hospital Stay (HOSPITAL_COMMUNITY): Payer: Medicaid Other

## 2021-11-14 DIAGNOSIS — N184 Chronic kidney disease, stage 4 (severe): Secondary | ICD-10-CM | POA: Diagnosis not present

## 2021-11-14 DIAGNOSIS — I5033 Acute on chronic diastolic (congestive) heart failure: Secondary | ICD-10-CM | POA: Diagnosis not present

## 2021-11-14 DIAGNOSIS — D649 Anemia, unspecified: Secondary | ICD-10-CM | POA: Diagnosis not present

## 2021-11-14 DIAGNOSIS — I3139 Other pericardial effusion (noninflammatory): Secondary | ICD-10-CM | POA: Diagnosis not present

## 2021-11-14 DIAGNOSIS — I5030 Unspecified diastolic (congestive) heart failure: Secondary | ICD-10-CM | POA: Diagnosis not present

## 2021-11-14 DIAGNOSIS — I5031 Acute diastolic (congestive) heart failure: Secondary | ICD-10-CM | POA: Diagnosis not present

## 2021-11-14 DIAGNOSIS — N179 Acute kidney failure, unspecified: Secondary | ICD-10-CM | POA: Diagnosis not present

## 2021-11-14 DIAGNOSIS — I129 Hypertensive chronic kidney disease with stage 1 through stage 4 chronic kidney disease, or unspecified chronic kidney disease: Secondary | ICD-10-CM | POA: Diagnosis not present

## 2021-11-14 DIAGNOSIS — N250 Renal osteodystrophy: Secondary | ICD-10-CM | POA: Diagnosis not present

## 2021-11-14 DIAGNOSIS — R0602 Shortness of breath: Secondary | ICD-10-CM | POA: Diagnosis not present

## 2021-11-14 DIAGNOSIS — J9601 Acute respiratory failure with hypoxia: Secondary | ICD-10-CM | POA: Diagnosis not present

## 2021-11-14 DIAGNOSIS — I130 Hypertensive heart and chronic kidney disease with heart failure and stage 1 through stage 4 chronic kidney disease, or unspecified chronic kidney disease: Secondary | ICD-10-CM | POA: Diagnosis not present

## 2021-11-14 LAB — PROTIME-INR
INR: 1 (ref 0.8–1.2)
Prothrombin Time: 12.6 seconds (ref 11.4–15.2)

## 2021-11-14 LAB — CBC
HCT: 24.6 % — ABNORMAL LOW (ref 36.0–46.0)
Hemoglobin: 7.9 g/dL — ABNORMAL LOW (ref 12.0–15.0)
MCH: 27.7 pg (ref 26.0–34.0)
MCHC: 32.1 g/dL (ref 30.0–36.0)
MCV: 86.3 fL (ref 80.0–100.0)
Platelets: 306 10*3/uL (ref 150–400)
RBC: 2.85 MIL/uL — ABNORMAL LOW (ref 3.87–5.11)
RDW: 13.2 % (ref 11.5–15.5)
WBC: 10.2 10*3/uL (ref 4.0–10.5)
nRBC: 0 % (ref 0.0–0.2)

## 2021-11-14 LAB — PHOSPHORUS: Phosphorus: 4.9 mg/dL — ABNORMAL HIGH (ref 2.5–4.6)

## 2021-11-14 LAB — URINE CULTURE: Culture: 70000 — AB

## 2021-11-14 LAB — BASIC METABOLIC PANEL
Anion gap: 9 (ref 5–15)
BUN: 42 mg/dL — ABNORMAL HIGH (ref 6–20)
CO2: 21 mmol/L — ABNORMAL LOW (ref 22–32)
Calcium: 8.6 mg/dL — ABNORMAL LOW (ref 8.9–10.3)
Chloride: 108 mmol/L (ref 98–111)
Creatinine, Ser: 5.41 mg/dL — ABNORMAL HIGH (ref 0.44–1.00)
GFR, Estimated: 9 mL/min — ABNORMAL LOW (ref >60)
Glucose, Bld: 230 mg/dL — ABNORMAL HIGH (ref 70–99)
Potassium: 3.7 mmol/L (ref 3.5–5.1)
Sodium: 138 mmol/L (ref 135–145)

## 2021-11-14 LAB — GLUCOSE, CAPILLARY
Glucose-Capillary: 211 mg/dL — ABNORMAL HIGH (ref 70–99)
Glucose-Capillary: 206 mg/dL — ABNORMAL HIGH (ref 70–99)
Glucose-Capillary: 294 mg/dL — ABNORMAL HIGH (ref 70–99)
Glucose-Capillary: 205 mg/dL — ABNORMAL HIGH (ref 70–99)
Glucose-Capillary: 114 mg/dL — ABNORMAL HIGH (ref 70–99)

## 2021-11-14 LAB — ECHOCARDIOGRAM COMPLETE
Area-P 1/2: 3.91 cm2
Height: 60 in
MV VTI: 1.19 cm2
S' Lateral: 2.8 cm
Weight: 3200 oz

## 2021-11-14 LAB — APTT: aPTT: 31 seconds (ref 24–36)

## 2021-11-14 MED ORDER — PERFLUTREN LIPID MICROSPHERE
1.0000 mL | INTRAVENOUS | Status: AC | PRN
  Administered 2021-11-14: 2 mL via INTRAVENOUS

## 2021-11-14 MED ORDER — AMOXICILLIN-POT CLAVULANATE 875-125 MG PO TABS
1.0000 | ORAL_TABLET | Freq: Two times a day (BID) | ORAL | Status: DC
  Administered 2021-11-14 – 2021-11-15 (×2): 1 via ORAL
  Filled 2021-11-14 (×2): qty 1

## 2021-11-14 MED ORDER — CARVEDILOL 25 MG PO TABS
25.0000 mg | ORAL_TABLET | Freq: Two times a day (BID) | ORAL | Status: DC
  Administered 2021-11-14 – 2021-11-16 (×4): 25 mg via ORAL
  Filled 2021-11-14 (×4): qty 1

## 2021-11-14 NOTE — Evaluation (Signed)
Occupational Therapy Evaluation Patient Details Name: Ana [name] MRN: [id number] DOB: December 30, 1970 Today's Date: 11/14/2021   History of Present Illness Patient presenting to the ED with SOB, fatigue, and cough on 11/13/21. Admitted same day with CHF exacerbation. PMH includes: HTN, HLD, dCHF, DM 2, CKD 3b   Clinical Impression   Prior to this admission, patient lived with her daughter and grandchildren. Patient has significant diabetic retinopathy, and can see shapes and outlines at baseline. Patient does not engage in IADLs, and is able to complete basic ADLs independently. Patient is also independent in ambulation without an AD as patient has her layout memorized. Currently, patient is set up for ADLs, and one person HHA for ambulation solely due to the room layout being foreign for the patient. OT will continue to follow acutely, with no OT follow up recommended at discharge.       Recommendations for follow up therapy are one component of a multi-disciplinary discharge planning process, led by the attending physician.  Recommendations may be updated based on patient status, additional functional criteria and insurance authorization.   Follow Up Recommendations  No OT follow up    Assistance Recommended at Discharge PRN  Patient can return home with the following A little help with bathing/dressing/bathroom;Assistance with cooking/housework;Direct supervision/assist for medications management;Direct supervision/assist for financial management;Assist for transportation;Help with stairs or ramp for entrance    Functional Status Assessment  Patient has had a recent decline in their functional status and demonstrates the ability to make significant improvements in function in a reasonable and predictable amount of time.  Equipment Recommendations  None recommended by OT    Recommendations for Other Services       Precautions / Restrictions Precautions Precautions: Fall;Other (comment)  (Low Vision) Restrictions Weight Bearing Restrictions: No      Mobility Bed Mobility Overal bed mobility: Modified Independent                  Transfers Overall transfer level: Needs assistance Equipment used: 1 person hand held assist Transfers: Sit to/from Stand             General transfer comment: patient requiring HHA solely due to room being foriegn with patients decreased visual capabilities      Balance                                           ADL either performed or assessed with clinical judgement   ADL Overall ADL's : Needs assistance/impaired Eating/Feeding: Set up;Sitting   Grooming: Set up;Sitting   Upper Body Bathing: Set up;Sitting   Lower Body Bathing: Set up;Sitting/lateral leans;Sit to/from stand   Upper Body Dressing : Set up;Sitting   Lower Body Dressing: Set up;Sitting/lateral leans   Toilet Transfer: Set up;Ambulation Toilet Transfer Details (indicate cue type and reason): simulated with ambulation in room, patient requiring HHA solely due to room being foriegn with patients decreased visual capabilities Toileting- Clothing Manipulation and Hygiene: Set up       Functional mobility during ADLs: Set up General ADL Comments: Patient is likely near baseline, requiring increased assist solely due to visual impairment     Vision Baseline Vision/History: 5 Retinopathy (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) Ability to See in Adequate Light: 3 Highly impaired Patient Visual Report: No change from baseline       Perception     Praxis  Pertinent Vitals/Pain Pain Assessment Pain Assessment: No/denies pain     Hand Dominance     Extremity/Trunk Assessment Upper Extremity Assessment Upper Extremity Assessment: Generalized weakness   Lower Extremity Assessment Lower Extremity Assessment: Defer to PT evaluation   Cervical / Trunk Assessment Cervical / Trunk Assessment: Other exceptions (increased body  habitus)   Communication Communication Communication: No difficulties   Cognition Arousal/Alertness: Awake/alert Behavior During Therapy: WFL for tasks assessed/performed Overall Cognitive Status: Within Functional Limits for tasks assessed                                       General Comments  Patient on 1L and at 100% upon entry, oxygen removed with o2 remaining above 96%, oxygen left off of patient with RN notified.    Exercises     Shoulder Instructions      Home Living Family/patient expects to be discharged to:: Private residence Living Arrangements: Children Available Help at Discharge: Family;Available 24 hours/day Type of Home: House Home Access: Stairs to enter [employer] of Steps: 1 Entrance Stairs-Rails: Right Home Layout: One level     Bathroom Shower/Tub: Curtain;Tub/shower unit   Bathroom Toilet: Standard     Home Equipment: None          Prior Functioning/Environment Prior Level of Function : Independent/Modified Independent;Other (comment)             Mobility Comments: Uses furniture to get around and assist from family, can see shadows and does not currently use an AD ADLs Comments: Daughter will select clothes for patient, and patient can dress, bathe, and complete basic ADL tasks, patient no longer drives, cooks, or pays bills due to vision impairment        OT Problem List: Decreased strength;Decreased activity tolerance;Cardiopulmonary status limiting activity      OT Treatment/Interventions: Self-care/ADL training;Therapeutic exercise;Energy conservation;DME and/or AE instruction;Therapeutic activities;Balance training;Manual therapy    OT Goals(Current goals can be found in the care plan section) Acute Rehab OT Goals Patient Stated Goal: to get back home OT Goal Formulation: With patient Time For Goal Achievement: 11/28/21 Potential to Achieve Goals: Good ADL Goals Pt Will Transfer to Toilet: with  set-up;ambulating Pt Will Perform Toileting - Clothing Manipulation and hygiene: Independently Additional ADL Goal #1: Patient will be able to verbalize 3 strategies for energy conservation and fall prevention to prevent further admissions. Additional ADL Goal #2: Patient will be able to verbalize 3 strategies for CHF management to prevent further hospitalizations and longevity at home.  OT Frequency: Min 2X/week    Co-evaluation              AM-PAC OT "6 Clicks" Daily Activity     Outcome Measure Help from another person eating meals?: A Little Help from another person taking care of personal grooming?: A Little Help from another person toileting, which includes using toliet, bedpan, or urinal?: A Little Help from another person bathing (including washing, rinsing, drying)?: A Little Help from another person to put on and taking off regular upper body clothing?: A Little Help from another person to put on and taking off regular lower body clothing?: A Little 6 Click Score: 18   End of Session Nurse Communication: Mobility status;Other (comment) (oxygen)  Activity Tolerance: Patient tolerated treatment well Patient left: in bed;with call bell/phone within reach;with bed alarm set (all rails up due to patients vision to promote safety)  OT  Visit Diagnosis: Unsteadiness on feet (R26.81);Muscle weakness (generalized) (M62.81);Low vision, both eyes (H54.2)                Time: 0810-0820 OT Time Calculation (min): 10 min Charges:  OT General Charges $OT Visit: 1 Visit OT Evaluation $OT Eval Moderate Complexity: 1 Mod  Corinne Ports E. Saahas Hidrogo, OTR/L Acute Rehabilitation Services [phone]   Ascencion Dike 11/14/2021, 11:07 AM

## 2021-11-14 NOTE — Progress Notes (Signed)
Subjective:  1800 UOP- negative 1500 but she says her Purewick  became dislodged so UOP was more-  BUN and crt just up slightly. She feels so much better-  she is grateful   Objective Vital signs in last 24 hours: Vitals:   11/14/21 0454 11/14/21 0836 11/14/21 1118 11/14/21 1237  BP: (!) 141/72 (!) 159/75 (!) 152/63 (!) 151/69  Pulse: 81 81 87 82  Resp: 20 19  18   Temp: 97.9 F (36.6 C) 97.8 F (36.6 C)  97.9 F (36.6 C)  TempSrc: Oral Oral  Oral  SpO2: 100% 99%  97%  Weight:      Height:       Weight change:   Intake/Output Summary (Last 24 hours) at 11/14/2021 1405 Last data filed at 11/14/2021 1152 Gross per 24 hour  Intake 425 ml  Output 2150 ml  Net -1725 ml    Assessment/ Plan: Pt is a 51 y.o. yo female with DM, HTN, stage 4 CKD who was admitted on 11/13/2021 with  volume overload and worsening  creatinine  Assessment/Plan: 1. Renal-  known CKD-  crt 2-2.2 in July/August 2022 and 3.75 in April of 23 followed by Dr. Theador Hawthorne.  Now presents with volume overload in the setting of running out of lasix-  crt 5.1.  Unfotunately this just appears to be fairly rapid progression of CKD in the setting of DM.  Serologies have been checked in the past and were negative.  Interestingly the renal ultrasound shows fairly normal appearing kidneys-  urine showing proteinuria and hematuria.  Have held the ARB.  Crt slightly worse than yesterday.  No indication for dialysis  2. HTN/vol-  overloaded-  has responded to iv lasix 120 q 8-  pt very much symptomatically improved -  also on norvasc/coreg and hydralazine   3. Anemia-  hgb in the 7's- iron low-  giving iron and likely will need ESA  Louis Meckel   Labs: Basic Metabolic Panel: Recent Labs  Lab 11/13/21 0901 11/13/21 1118 11/14/21 0425  NA 136  --  138  K 3.7  --  3.7  CL 107  --  108  CO2 19*  --  21*  GLUCOSE 268*  --  230*  BUN 40*  --  42*  CREATININE 5.15*  --  5.41*  CALCIUM 8.5*  --  8.6*  PHOS  --  4.6 4.9*    Liver Function Tests: No results for input(s): "AST", "ALT", "ALKPHOS", "BILITOT", "PROT", "ALBUMIN" in the last 168 hours. No results for input(s): "LIPASE", "AMYLASE" in the last 168 hours. No results for input(s): "AMMONIA" in the last 168 hours. CBC: Recent Labs  Lab 11/13/21 0901 11/14/21 0425  WBC 10.5 10.2  HGB 8.8* 7.9*  HCT 26.9* 24.6*  MCV 86.5 86.3  PLT 334 306   Cardiac Enzymes: No results for input(s): "CKTOTAL", "CKMB", "CKMBINDEX", "TROPONINI" in the last 168 hours. CBG: Recent Labs  Lab 11/13/21 1716 11/14/21 0042 11/14/21 0843 11/14/21 1245  GLUCAP 237* 211* 206* 294*    Iron Studies:  Recent Labs    11/13/21 1013  IRON 46  TIBC 345  FERRITIN 81   Studies/Results: ECHOCARDIOGRAM COMPLETE  Result Date: 11/14/2021    ECHOCARDIOGRAM REPORT   Patient Name:   ENDIYA KLAHR Markes Date of Exam: 11/14/2021 Medical Rec #:  [id number]      Height:       60.0 in Accession #:    [id number]     Weight:  200.0 lb Date of Birth:  Mar 16, 1970      BSA:          1.867 m Patient Age:    51 years       BP:           159/75 mmHg Patient Gender: F              HR:           84 bpm. Exam Location:  Inpatient Procedure: 2D Echo, Cardiac Doppler, Color Doppler and Intracardiac            Opacification Agent Indications:    CHF/Pericardial Effusion  History:        Patient has prior history of Echocardiogram examinations, most                 recent 09/16/2020. Risk Factors:Hypertension, Dyslipidemia and                 Diabetes. CKD 3b.  Sonographer:    Eartha Inch Referring Phys: [phone] SEYED A SHAHMEHDI  Sonographer Comments: Technically difficult study due to poor echo windows. Image acquisition challenging due to patient body habitus and Image acquisition challenging due to respiratory motion. Small pericardial effusion noted. IMPRESSIONS  1. Left ventricular ejection fraction, by estimation, is 60 to 65%. The left ventricle has normal function. The left ventricle has no  regional wall motion abnormalities. Left ventricular diastolic parameters are consistent with Grade II diastolic dysfunction (pseudonormalization). Elevated left ventricular end-diastolic pressure.  2. Right ventricular systolic function was not well visualized. The right ventricular size is not well visualized. Tricuspid regurgitation signal is inadequate for assessing PA pressure.  3. Left atrial size was moderately dilated.  4. A small pericardial effusion is present. The pericardial effusion is circumferential. There is no evidence of cardiac tamponade.  5. The mitral valve is normal in structure. No evidence of mitral valve regurgitation. No evidence of mitral stenosis.  6. The aortic valve is tricuspid. Aortic valve regurgitation is not visualized. Aortic valve sclerosis/calcification is present, without any evidence of aortic stenosis. FINDINGS  Left Ventricle: Left ventricular ejection fraction, by estimation, is 60 to 65%. The left ventricle has normal function. The left ventricle has no regional wall motion abnormalities. Definity contrast agent was given IV to delineate the left ventricular  endocardial borders. The left ventricular internal cavity size was normal in size. There is no left ventricular hypertrophy. Left ventricular diastolic parameters are consistent with Grade II diastolic dysfunction (pseudonormalization). Elevated left ventricular end-diastolic pressure. Right Ventricle: The right ventricular size is not well visualized. Right vetricular wall thickness was not assessed. Right ventricular systolic function was not well visualized. Tricuspid regurgitation signal is inadequate for assessing PA pressure. Left Atrium: Left atrial size was moderately dilated. Right Atrium: Right atrial size was normal in size. Pericardium: A small pericardial effusion is present. The pericardial effusion is circumferential. There is no evidence of cardiac tamponade. Mitral Valve: The mitral valve is normal in  structure. Mild mitral annular calcification. No evidence of mitral valve regurgitation. No evidence of mitral valve stenosis. MV peak gradient, 8.2 mmHg. The mean mitral valve gradient is 4.0 mmHg. Tricuspid Valve: The tricuspid valve is normal in structure. Tricuspid valve regurgitation is not demonstrated. No evidence of tricuspid stenosis. Aortic Valve: The aortic valve is tricuspid. Aortic valve regurgitation is not visualized. Aortic valve sclerosis/calcification is present, without any evidence of aortic stenosis. Pulmonic Valve: The pulmonic valve was normal in structure. Pulmonic valve regurgitation is  mild. No evidence of pulmonic stenosis. Aorta: The aortic root is normal in size and structure. Venous: The inferior vena cava was not well visualized. IAS/Shunts: No atrial level shunt detected by color flow Doppler.  LEFT VENTRICLE PLAX 2D LVIDd:         4.20 cm   Diastology LVIDs:         2.80 cm   LV e' medial:    6.57 cm/s LV PW:         1.10 cm   LV E/e' medial:  17.8 LV IVS:        1.00 cm   LV e' lateral:   8.55 cm/s LVOT diam:     1.55 cm   LV E/e' lateral: 13.7 LV SV:         42 LV SV Index:   23 LVOT Area:     1.89 cm  RIGHT VENTRICLE RV S prime:     10.40 cm/s LEFT ATRIUM           Index LA diam:      4.30 cm 2.30 cm/m LA Vol (A2C): 49.9 ml 26.73 ml/m LA Vol (A4C): 85.6 ml 45.86 ml/m  AORTIC VALVE LVOT Vmax:   112.00 cm/s LVOT Vmean:  88.900 cm/s LVOT VTI:    0.224 m  AORTA Ao Root diam: 2.30 cm Ao Asc diam:  3.00 cm MITRAL VALVE MV Area (PHT): 3.91 cm     SHUNTS MV Area VTI:   1.19 cm     Systemic VTI:  0.22 m MV Peak grad:  8.2 mmHg     Systemic Diam: 1.55 cm MV Mean grad:  4.0 mmHg MV Vmax:       1.43 m/s MV Vmean:      93.9 cm/s MV Decel Time: 194 msec MV E velocity: 117.00 cm/s MV A velocity: 122.00 cm/s MV E/A ratio:  0.96 Fransico Him MD Electronically signed by Fransico Him MD Signature Date/Time: 11/14/2021/11:32:09 AM    Final    US RENAL  Result Date: 11/13/2021 CLINICAL DATA:   Acute renal failure EXAM: RENAL / URINARY TRACT ULTRASOUND COMPLETE COMPARISON:  06/14/2020 FINDINGS: Right Kidney: Renal measurements: 11.0 x 5.7 x 5.7 cm = volume: 187.8 ML. Echogenicity within normal limits. No mass or hydronephrosis visualized. Left Kidney: Renal measurements: 10.8 x 6.6 x 5.9 cm = volume: 217.7 mL. Echogenicity within normal limits. No mass or hydronephrosis visualized. Bladder: Appears normal for degree of bladder distention. Other: None. IMPRESSION: 1. Unremarkable renal ultrasound. Electronically Signed   By: Randa Ngo M.D.   On: 11/13/2021 22:49   DG Chest 2 View  Result Date: 11/13/2021 CLINICAL DATA:  51 year old female with shortness of breath and cough. EXAM: CHEST - 2 VIEW COMPARISON:  Chest radiograph 09/02/2020 and earlier. FINDINGS: Upright AP and lateral views of the chest at 0921 hours. Chronic cardiomegaly. Mildly lower lung volumes. There is trace pleural fluid in the fissures on the lateral view. Increased pulmonary vascularity compared to prior exams. No layering pleural effusion, pneumothorax, or consolidation. Visualized tracheal air column is within normal limits. No acute osseous abnormality identified. Stable cholecystectomy clips. Paucity of bowel gas in the visible abdomen. IMPRESSION: Chronic cardiomegaly with acute pulmonary interstitial edema. Electronically Signed   By: Genevie Ann M.D.   On: 11/13/2021 09:31   Medications: Infusions:  cefTRIAXone (ROCEPHIN)  IV Stopped (11/13/21 1322)   furosemide 120 mg (11/14/21 1138)    Scheduled Medications:  amLODipine  10 mg Oral Daily   carvedilol  12.5 mg Oral BID   heparin  5,000 Units Subcutaneous Q8H   hydrALAZINE  50 mg Oral BID   insulin aspart  0-6 Units Subcutaneous TID WC   levothyroxine  50 mcg Oral Daily   rosuvastatin  40 mg Oral Q supper   sodium bicarbonate  650 mg Oral QID   sodium chloride flush  3 mL Intravenous Q12H    have reviewed scheduled and prn medications.  Physical  Exam: General: obese , alert Heart: RRR Lungs: mostly clear Abdomen: obese, soft, non tender Extremities:min peripheral edema     11/14/2021,2:05 PM  LOS: 1 day

## 2021-11-14 NOTE — Progress Notes (Addendum)
PROGRESS NOTE  Ana [name] DEY:[id number] DOB: 14-Dec-1970 DOA: 11/13/2021 PCP: System, Provider Not In  HPI/Recap of past 24 hours: Ana [name] is a 51 year old female with extensive history of HTN, HLD, dCHF, DM 2, CKD 3b, presenting to [facility] ED with chief complaint of shortness of breath, and fatigue of 1 week duration.  Associated with orthopnea and lower extremity edema.  Work-up reveals acute diastolic CHF.  Chest x-ray with cardiomegaly and pulmonary edema.  Elevated BNP greater than 500.  AKI with concern for cardiorenal syndrome.  Admitting team consulted nephrology who is following.  Started on IV Lasix 120 mg 3 times daily.  The patient was transferred to [facility] for further evaluation and management.  Seen by cardiology at [facility], following.  Additionally, started on Rocephin on admission due to concern for sepsis secondary to UTI.   11/14/21: The patient was seen and examined at her bedside.  Her daughter was present in the room.  She was getting an echocardiogram study.  States she feels better after diuresing.    Assessment/Plan: Principal Problem:   CHF (congestive heart failure) (HCC) Active Problems:   Acute renal failure superimposed on stage 3b chronic kidney disease (HCC)   Acute respiratory failure with hypoxia (HCC)   Sepsis secondary to UTI ([place])   Uncontrolled type 2 diabetes mellitus with hyperglycemia (HCC)   Essential hypertension, benign   Mixed hyperlipidemia   Hypothyroidism, adult   Vitamin D deficiency disease  Acute diastolic CHF (congestive heart failure) (HCC) - Acute diastolic congestive heart failure Elevated BNP, lower extremity edema, cardiomegaly and pulmonary edema on chest x-ray. Follow 2D echo On IV Lasix 120 mg 3 times daily. Closely monitor renal function.  Replace electrolytes as indicated. Continue strict I's and O's and daily weight Appreciate cardiology's and nephrology's assistance.   -Last echocardiogram  09/16/2020: Reviewed LEJF: 60 to 65%. Moderate LVH Left ventricular diastolic  parameters were normal. There is normal pulmonary artery systolic pressure. 3. Left atrial size was mildly dilated. 4. Right atrial size was mildly dilated. 5. Small to moderate pericardial effusion.  Follow repeat 2D echo.   Acute respiratory failure with hypoxia (HCC) - Likely due to volume overload -Continue aggressive diuresing -In ED required up to 4 L of oxygen by nasal cannula currently on 2 L satting 100% -Continue supplemental oxygen Continue to wean off oxygen supplementation as tolerated.   Acute renal failure superimposed on stage 3b chronic kidney disease ([place]) - History of CKD IIIb -Acute on chronic kidney disease -Severe volume overload, possible cardiorenal syndrome  -Avoiding all nephrotoxins : Including her naproxen, losartan, Nephrology following. Continue sodium bicarbonate, and start  lasix 120 IV TID as recommended by nephrology. Closely monitor urine output Repeat renal panel in the morning   Sepsis, resolved, secondary to strep agalactiae UTI ([place]) - UA indicative of UTI: Positive leukocyte esterase UA WBC> 50 -On Rocephin, switched to Augmentin twice daily x5 days. -Urine cultures >>> urine culture taken on 11/13/2021 growing 70,000 colonies of group B strep Currently afebrile with no leukocytosis.   Hypothyroidism, adult - Continue home dose Synthroid   Mixed hyperlipidemia - Continue home rosuvastatin   Essential hypertension, benign - Accelerated hypertension, as high as 201/83--- currently 170/78 Continue IV Lasix, Norvasc, Coreg, hydralazine, -Holding losartan due to AKI on CKD  Continue to monitor vital signs   Uncontrolled type 2 diabetes mellitus with hyperglycemia ([place]) Hold off home oral hypoglycemics. Insulin sliding scale As hemoglobin A1c 8.3 on 06/17/2021.  Anemia of chronic disease, in the setting of advanced CKD Hemoglobin downtrending No overt  bleeding reported Monitor H&H Repeat CBC in the AM                           Consults called: Nephrology, cardiology  -------------------------------------------------------------------------------------------------------------------------------------------- DVT prophylaxis:  Subcu heparin injection 5,000 Units Start: 11/13/21 2200 TED hose Start: 11/13/21 1118 SCDs Start: 11/13/21 1118     Code Status:   Code Status: Full Code     Admission status: Patient will be admitted as Inpatient, with a greater than 2 midnight length of stay. Level of care: Telemetry Medical     Family Communication: Daughter at bedside      Status is: Inpatient The patient will need at least 2 midnights for further evaluation and treatment of present condition.    Objective: Vitals:   11/14/21 0454 11/14/21 0836 11/14/21 1118 11/14/21 1237  BP: (!) 141/72 (!) 159/75 (!) 152/63 (!) 151/69  Pulse: 81 81 87 82  Resp: 20 19  18   Temp: 97.9 F (36.6 C) 97.8 F (36.6 C)  97.9 F (36.6 C)  TempSrc: Oral Oral  Oral  SpO2: 100% 99%  97%  Weight:      Height:        Intake/Output Summary (Last 24 hours) at 11/14/2021 1800 Last data filed at 11/14/2021 1525 Gross per 24 hour  Intake 587 ml  Output 2150 ml  Net -1563 ml   Filed Weights   11/13/21 0916  Weight: 90.7 kg    Exam:  General: 51 y.o. year-old female well developed well nourished in no acute distress.  Alert and oriented x3. Cardiovascular: Regular rate and rhythm with no rubs or gallops.  No thyromegaly or JVD noted.   Respiratory: Mild rales at bases.  No wheezing noted.  Poor inspiratory effort.   Abdomen: Soft nontender nondistended with normal bowel sounds x4 quadrants. Musculoskeletal: Trace edema in lower extremities bilaterally. Skin: No ulcerative lesions noted or rashes, Psychiatry: Mood is appropriate for condition and setting   Data Reviewed: CBC: Recent Labs  Lab 11/13/21 0901 11/14/21 0425  WBC  10.5 10.2  HGB 8.8* 7.9*  HCT 26.9* 24.6*  MCV 86.5 86.3  PLT 334 081   Basic Metabolic Panel: Recent Labs  Lab 11/13/21 0901 11/13/21 1118 11/14/21 0425  NA 136  --  138  K 3.7  --  3.7  CL 107  --  108  CO2 19*  --  21*  GLUCOSE 268*  --  230*  BUN 40*  --  42*  CREATININE 5.15*  --  5.41*  CALCIUM 8.5*  --  8.6*  MG  --  2.0  --   PHOS  --  4.6 4.9*   GFR: Estimated Creatinine Clearance: 12.4 mL/min (A) (by C-G formula based on SCr of 5.41 mg/dL (H)). Liver Function Tests: No results for input(s): "AST", "ALT", "ALKPHOS", "BILITOT", "PROT", "ALBUMIN" in the last 168 hours. No results for input(s): "LIPASE", "AMYLASE" in the last 168 hours. No results for input(s): "AMMONIA" in the last 168 hours. Coagulation Profile: Recent Labs  Lab 11/13/21 0901 11/14/21 0425  INR 0.9 1.0   Cardiac Enzymes: No results for input(s): "CKTOTAL", "CKMB", "CKMBINDEX", "TROPONINI" in the last 168 hours. BNP (last 3 results) No results for input(s): "PROBNP" in the last 8760 hours. HbA1C: No results for input(s): "HGBA1C" in the last 72 hours. CBG: Recent Labs  Lab 11/13/21 1716 11/14/21 0042  11/14/21 0843 11/14/21 1245 11/14/21 1621  GLUCAP 237* 211* 206* 294* 205*   Lipid Profile: No results for input(s): "CHOL", "HDL", "LDLCALC", "TRIG", "CHOLHDL", "LDLDIRECT" in the last 72 hours. Thyroid Function Tests: Recent Labs    11/13/21 1013  TSH 3.795   Anemia Panel: Recent Labs    11/13/21 1013  FERRITIN 81  TIBC 345  IRON 46   Urine analysis:    Component Value Date/Time   COLORURINE YELLOW 11/13/2021 1007   APPEARANCEUR CLOUDY (A) 11/13/2021 1007   LABSPEC 1.010 11/13/2021 1007   PHURINE 6.0 11/13/2021 1007   GLUCOSEU >=500 (A) 11/13/2021 1007   HGBUR SMALL (A) 11/13/2021 1007   BILIRUBINUR NEGATIVE 11/13/2021 1007   BILIRUBINUR negative 01/07/2013 1149   KETONESUR NEGATIVE 11/13/2021 1007   PROTEINUR >=300 (A) 11/13/2021 1007   UROBILINOGEN 0.2 10/25/2014  2235   NITRITE NEGATIVE 11/13/2021 1007   LEUKOCYTESUR LARGE (A) 11/13/2021 1007   Sepsis Labs: @LABRCNTIP (procalcitonin:4,lacticidven:4)  ) Recent Results (from the past 240 hour(s))  Urine Culture     Status: Abnormal   Collection Time: 11/13/21 10:07 AM   Specimen: Urine, Clean Catch  Result Value Ref Range Status   Specimen Description   Final    URINE, CLEAN CATCH Performed at [facility], [address], [place], [place] [address]    Special Requests   Final    NONE Performed at [facility], [address] [place], [place] [address]    Culture (A)  Final    70,000 COLONIES/mL GROUP B STREP(S.AGALACTIAE)ISOLATED TESTING AGAINST S. AGALACTIAE NOT ROUTINELY PERFORMED DUE TO PREDICTABILITY OF AMP/PEN/VAN SUSCEPTIBILITY. Performed at [facility], [place] [address], [place], [place] [address]    Report Status 11/14/2021 FINAL  Final  Resp Panel by RT-PCR (Flu A&B, Covid) Urine, Clean Catch     Status: None   Collection Time: 11/13/21 10:10 AM   Specimen: Urine, Clean Catch; Nasal Swab  Result Value Ref Range Status   SARS Coronavirus 2 by RT PCR NEGATIVE NEGATIVE Final    Comment: (NOTE) SARS-CoV-2 target nucleic acids are NOT DETECTED.  The SARS-CoV-2 RNA is generally detectable in upper respiratory specimens during the acute phase of infection. The lowest concentration of SARS-CoV-2 viral copies this assay can detect is 138 copies/mL. A negative result does not preclude SARS-Cov-2 infection and should not be used as the sole basis for treatment or other patient management decisions. A negative result may occur with  improper specimen collection/handling, submission of specimen other than nasopharyngeal swab, presence of viral mutation(s) within the areas targeted by this assay, and inadequate number of viral copies(<138 copies/mL). A negative result must be combined with clinical observations, patient history, and epidemiological information. The expected result  is Negative.  Fact Sheet for Patients:  EntrepreneurPulse.com.au  Fact Sheet for Healthcare Providers:  IncredibleEmployment.be  This test is no t yet approved or cleared by the [place] FDA and  has been authorized for detection and/or diagnosis of SARS-CoV-2 by FDA under an Emergency Use Authorization (EUA). This EUA will remain  in effect (meaning this test can be used) for the duration of the COVID-19 declaration under Section 564(b)(1) of the Act, 21 U.S.C.section 360bbb-3(b)(1), unless the authorization is terminated  or revoked sooner.       Influenza A by PCR NEGATIVE NEGATIVE Final   Influenza B by PCR NEGATIVE NEGATIVE Final    Comment: (NOTE) The Xpert Xpress SARS-CoV-2/FLU/RSV plus assay is intended as an aid in the diagnosis of influenza from  Nasopharyngeal swab specimens and should not be used as a sole basis for treatment. Nasal washings and aspirates are unacceptable for Xpert Xpress SARS-CoV-2/FLU/RSV testing.  Fact Sheet for Patients: EntrepreneurPulse.com.au  Fact Sheet for Healthcare Providers: IncredibleEmployment.be  This test is not yet approved or cleared by the [place] FDA and has been authorized for detection and/or diagnosis of SARS-CoV-2 by FDA under an Emergency Use Authorization (EUA). This EUA will remain in effect (meaning this test can be used) for the duration of the COVID-19 declaration under Section 564(b)(1) of the Act, 21 U.S.C. section 360bbb-3(b)(1), unless the authorization is terminated or revoked.  Performed at [facility], [address], [place], [place] [address]       Studies: ECHOCARDIOGRAM COMPLETE  Result Date: 11/14/2021    ECHOCARDIOGRAM REPORT   Patient Name:   SHATOYA ROETS Date of Exam: 11/14/2021 Medical Rec #:  [id number]      Height:       60.0 in Accession #:    [id number]     Weight:       200.0 lb Date of Birth:  1970/11/08       BSA:          1.867 m Patient Age:    17 years       BP:           159/75 mmHg Patient Gender: F              HR:           84 bpm. Exam Location:  Inpatient Procedure: 2D Echo, Cardiac Doppler, Color Doppler and Intracardiac            Opacification Agent Indications:    CHF/Pericardial Effusion  History:        Patient has prior history of Echocardiogram examinations, most                 recent 09/16/2020. Risk Factors:Hypertension, Dyslipidemia and                 Diabetes. CKD 3b.  Sonographer:    Eartha Inch Referring Phys: [phone] SEYED A SHAHMEHDI  Sonographer Comments: Technically difficult study due to poor echo windows. Image acquisition challenging due to patient body habitus and Image acquisition challenging due to respiratory motion. Small pericardial effusion noted. IMPRESSIONS  1. Left ventricular ejection fraction, by estimation, is 60 to 65%. The left ventricle has normal function. The left ventricle has no regional wall motion abnormalities. Left ventricular diastolic parameters are consistent with Grade II diastolic dysfunction (pseudonormalization). Elevated left ventricular end-diastolic pressure.  2. Right ventricular systolic function was not well visualized. The right ventricular size is not well visualized. Tricuspid regurgitation signal is inadequate for assessing PA pressure.  3. Left atrial size was moderately dilated.  4. A small pericardial effusion is present. The pericardial effusion is circumferential. There is no evidence of cardiac tamponade.  5. The mitral valve is normal in structure. No evidence of mitral valve regurgitation. No evidence of mitral stenosis.  6. The aortic valve is tricuspid. Aortic valve regurgitation is not visualized. Aortic valve sclerosis/calcification is present, without any evidence of aortic stenosis. FINDINGS  Left Ventricle: Left ventricular ejection fraction, by estimation, is 60 to 65%. The left ventricle has normal function. The left ventricle  has no regional wall motion abnormalities. Definity contrast agent was given IV to delineate the left ventricular  endocardial borders. The left ventricular internal cavity size was normal in size. There is no  left ventricular hypertrophy. Left ventricular diastolic parameters are consistent with Grade II diastolic dysfunction (pseudonormalization). Elevated left ventricular end-diastolic pressure. Right Ventricle: The right ventricular size is not well visualized. Right vetricular wall thickness was not assessed. Right ventricular systolic function was not well visualized. Tricuspid regurgitation signal is inadequate for assessing PA pressure. Left Atrium: Left atrial size was moderately dilated. Right Atrium: Right atrial size was normal in size. Pericardium: A small pericardial effusion is present. The pericardial effusion is circumferential. There is no evidence of cardiac tamponade. Mitral Valve: The mitral valve is normal in structure. Mild mitral annular calcification. No evidence of mitral valve regurgitation. No evidence of mitral valve stenosis. MV peak gradient, 8.2 mmHg. The mean mitral valve gradient is 4.0 mmHg. Tricuspid Valve: The tricuspid valve is normal in structure. Tricuspid valve regurgitation is not demonstrated. No evidence of tricuspid stenosis. Aortic Valve: The aortic valve is tricuspid. Aortic valve regurgitation is not visualized. Aortic valve sclerosis/calcification is present, without any evidence of aortic stenosis. Pulmonic Valve: The pulmonic valve was normal in structure. Pulmonic valve regurgitation is mild. No evidence of pulmonic stenosis. Aorta: The aortic root is normal in size and structure. Venous: The inferior vena cava was not well visualized. IAS/Shunts: No atrial level shunt detected by color flow Doppler.  LEFT VENTRICLE PLAX 2D LVIDd:         4.20 cm   Diastology LVIDs:         2.80 cm   LV e' medial:    6.57 cm/s LV PW:         1.10 cm   LV E/e' medial:  17.8 LV IVS:         1.00 cm   LV e' lateral:   8.55 cm/s LVOT diam:     1.55 cm   LV E/e' lateral: 13.7 LV SV:         42 LV SV Index:   23 LVOT Area:     1.89 cm  RIGHT VENTRICLE RV S prime:     10.40 cm/s LEFT ATRIUM           Index LA diam:      4.30 cm 2.30 cm/m LA Vol (A2C): 49.9 ml 26.73 ml/m LA Vol (A4C): 85.6 ml 45.86 ml/m  AORTIC VALVE LVOT Vmax:   112.00 cm/s LVOT Vmean:  88.900 cm/s LVOT VTI:    0.224 m  AORTA Ao Root diam: 2.30 cm Ao Asc diam:  3.00 cm MITRAL VALVE MV Area (PHT): 3.91 cm     SHUNTS MV Area VTI:   1.19 cm     Systemic VTI:  0.22 m MV Peak grad:  8.2 mmHg     Systemic Diam: 1.55 cm MV Mean grad:  4.0 mmHg MV Vmax:       1.43 m/s MV Vmean:      93.9 cm/s MV Decel Time: 194 msec MV E velocity: 117.00 cm/s MV A velocity: 122.00 cm/s MV E/A ratio:  0.96 Fransico Him MD Electronically signed by Fransico Him MD Signature Date/Time: 11/14/2021/11:32:09 AM    Final    US RENAL  Result Date: 11/13/2021 CLINICAL DATA:  Acute renal failure EXAM: RENAL / URINARY TRACT ULTRASOUND COMPLETE COMPARISON:  06/14/2020 FINDINGS: Right Kidney: Renal measurements: 11.0 x 5.7 x 5.7 cm = volume: 187.8 ML. Echogenicity within normal limits. No mass or hydronephrosis visualized. Left Kidney: Renal measurements: 10.8 x 6.6 x 5.9 cm = volume: 217.7 mL. Echogenicity within normal limits. No mass or hydronephrosis visualized. Bladder:  Appears normal for degree of bladder distention. Other: None. IMPRESSION: 1. Unremarkable renal ultrasound. Electronically Signed   By: Randa Ngo M.D.   On: 11/13/2021 22:49    Scheduled Meds:  amLODipine  10 mg Oral Daily   carvedilol  25 mg Oral BID   heparin  5,000 Units Subcutaneous Q8H   hydrALAZINE  50 mg Oral BID   insulin aspart  0-6 Units Subcutaneous TID WC   levothyroxine  50 mcg Oral Daily   rosuvastatin  40 mg Oral Q supper   sodium bicarbonate  650 mg Oral QID   sodium chloride flush  3 mL Intravenous Q12H    Continuous Infusions:  cefTRIAXone (ROCEPHIN)  IV  Stopped (11/14/21 1524)   furosemide 120 mg (11/14/21 1647)     LOS: 1 day     Kayleen Memos, MD [facility] Pager [phone]  If 7PM-7AM, please contact night-coverage [email or web address] Password [facility] 11/14/2021, 6:00 PM

## 2021-11-14 NOTE — Progress Notes (Signed)
Mobility Specialist - Progress Note   11/14/21 1537  Mobility  Activity Transferred to/from [facility]  Level of Assistance Standby assist, set-up cues, supervision of patient - no hands on  Assistive Device None  Activity Response Tolerated well  $Mobility charge 1 Mobility   Pt was received on BSC. After assisting in peri care pt was returned EOB with all needs met and family present.   Larey Seat

## 2021-11-14 NOTE — Care Management (Signed)
11-14-21 1601 Case Manager received a consult regarding PCP needs. Patient states she goes to [facility] and that her provider is leaving. Patient received notification letter that she will be able to utilize another provider in the office. Patient to contact the office. No further needs from Case Manager at this time.

## 2021-11-14 NOTE — Evaluation (Signed)
Physical Therapy Evaluation Patient Details Name: Ana [name] MRN: [id number] DOB: 1970/09/23 Today's Date: 11/14/2021  History of Present Illness  51 yo presenting to the ED with SOB, fatigue, and cough on 11/13/21. Admitted same day with CHF exacerbation. PMH includes: HTN, HLD, dCHF, DM 2, CKD 3b  Clinical Impression  Pt was seen for evaluation and noted her O2 sats remained in 90%+ range with HR as high as 117 then declining thereafter.  Had an artificially high BP with gait but was 152/75 upon sitting.  Pt is using HHA on furniture and minor verbal cues to trail on her walk, but is aware of the environment more so now since her eval earlier with OT.  Follow up with her for progressing mobility as goals of PT are outlining, and will anticipate no follow up PT will be needed.       Recommendations for follow up therapy are one component of a multi-disciplinary discharge planning process, led by the attending physician.  Recommendations may be updated based on patient status, additional functional criteria and insurance authorization.  Follow Up Recommendations No PT follow up      Assistance Recommended at Discharge Set up Supervision/Assistance  Patient can return home with the following  Assistance with cooking/housework;Assist for transportation;Help with stairs or ramp for entrance    Equipment Recommendations None recommended by PT  Recommendations for Other Services       Functional Status Assessment Patient has had a recent decline in their functional status and demonstrates the ability to make significant improvements in function in a reasonable and predictable amount of time.     Precautions / Restrictions Precautions Precautions: Fall;Other (comment) (legally blind) Restrictions Weight Bearing Restrictions: No      Mobility  Bed Mobility Overal bed mobility: Needs Assistance Bed Mobility: Supine to Sit     Supine to sit: Min guard (for safety only)           Transfers Overall transfer level: Needs assistance Equipment used: 1 person hand held assist Transfers: Sit to/from Stand Sit to Stand: Min guard           General transfer comment: to set up safely with all lines in her situation    Ambulation/Gait Ambulation/Gait assistance: Min guard Gait Distance (Feet): 50 Feet (25 x 2) Assistive device: IV Pole Gait Pattern/deviations: Step-through pattern, Wide base of support Gait velocity: reduced Gait velocity interpretation: <1.31 ft/sec, indicative of household ambulator Pre-gait activities: mobility on side of bed to ck balance General Gait Details: manuevering with min guard cues for environment with light being low  Science writer    Modified Rankin (Stroke Patients Only)       Balance Overall balance assessment: Needs assistance Sitting-balance support: Feet supported Sitting balance-Leahy Scale: Good     Standing balance support: Single extremity supported, During functional activity Standing balance-Leahy Scale: Fair                               Pertinent Vitals/Pain Pain Assessment Pain Assessment: No/denies pain    Home Living Family/patient expects to be discharged to:: Private residence Living Arrangements: Children Available Help at Discharge: Family;Available 24 hours/day Type of Home: House Home Access: Stairs to enter Entrance Stairs-Rails: Right Entrance Stairs-Number of Steps: 1   Home Layout: One level Home Equipment: None Additional Comments: pt reported higher toilet when PT came  by to assess    Prior Function Prior Level of Function : Independent/Modified Independent;Other (comment)             Mobility Comments: touching furniture to trail, aware of the layout       Hand Dominance   Dominant Hand: Right    Extremity/Trunk Assessment   Upper Extremity Assessment Upper Extremity Assessment: Defer to OT evaluation    Lower  Extremity Assessment Lower Extremity Assessment: RLE deficits/detail RLE Deficits / Details: mild strength difference, 4+ strength RLE Coordination: decreased gross motor    Cervical / Trunk Assessment Cervical / Trunk Assessment: Other exceptions (BMI)  Communication   Communication: No difficulties  Cognition Arousal/Alertness: Awake/alert Behavior During Therapy: WFL for tasks assessed/performed Overall Cognitive Status: Within Functional Limits for tasks assessed                                          General Comments General comments (skin integrity, edema, etc.): On room air for O2 sats ck    Exercises     Assessment/Plan    PT Assessment Patient needs continued PT services  PT Problem List Decreased strength;Decreased activity tolerance;Other (comment) (reduced comfort of environment)       PT Treatment Interventions DME instruction;Gait training;Functional mobility training;Stair training;Therapeutic activities;Therapeutic exercise;Balance training;Neuromuscular re-education;Patient/family education    PT Goals (Current goals can be found in the Care Plan section)  Acute Rehab PT Goals Patient Stated Goal: to get better and go home PT Goal Formulation: With patient Time For Goal Achievement: 11/21/21 Potential to Achieve Goals: Good    Frequency Min 3X/week     Co-evaluation               AM-PAC PT "6 Clicks" Mobility  Outcome Measure Help needed turning from your back to your side while in a flat bed without using bedrails?: None Help needed moving from lying on your back to sitting on the side of a flat bed without using bedrails?: A Little Help needed moving to and from a bed to a chair (including a wheelchair)?: A Little Help needed standing up from a chair using your arms (e.g., wheelchair or bedside chair)?: A Little Help needed to walk in hospital room?: A Little Help needed climbing 3-5 steps with a railing? : A Little 6  Click Score: 19    End of Session Equipment Utilized During Treatment: Gait belt Activity Tolerance: Patient limited by fatigue;Other (comment) (mild SOB without sat drop) Patient left: in bed;with call bell/phone within reach;with nursing/sitter in room Nurse Communication: Mobility status PT Visit Diagnosis: Difficulty in walking, not elsewhere classified (R26.2)    Time: 0932-3557 PT Time Calculation (min) (ACUTE ONLY): 23 min   Charges:   PT Evaluation $PT Eval Moderate Complexity: 1 Mod PT Treatments $Therapeutic Activity: 8-22 mins       Ramond Dial 11/14/2021, 12:34 PM  Mee Hives, PT PhD Acute Rehab Dept. Number: [place]

## 2021-11-14 NOTE — Inpatient Diabetes Management (Signed)
Inpatient Diabetes Program Recommendations  AACE/ADA: New Consensus Statement on Inpatient Glycemic Control   Target Ranges:  Prepandial:   less than 140 mg/dL      Peak postprandial:   less than 180 mg/dL (1-2 hours)      Critically ill patients:  140 - 180 mg/dL    Latest Reference Range & Units 11/14/21 00:42 11/14/21 08:43 11/14/21 12:45  Glucose-Capillary 70 - 99 mg/dL 211 (H) 206 (H) 294 (H)    Latest Reference Range & Units 11/13/21 17:16  Glucose-Capillary 70 - 99 mg/dL 237 (H)   Review of Glycemic Control  Diabetes history: DM2 Outpatient Diabetes medications: Glipizide 10 mg BID Current orders for Inpatient glycemic control: Novolog 0-6 units TID with meals  Inpatient Diabetes Program Recommendations:    Insulin: Please consider increasing Novolog correction to 0-9 units TID with meals and at HS. If glucose continues to be consistently over 180 mg/dl with increase in Novolog correction, may want to consider ordering Semglee 5 units Q24H.  Diet: Please consider discontinuing Regular diet and ordering Carb Modified diet.  Thanks, Barnie Alderman, RN, MSN, [place] Diabetes Coordinator Inpatient Diabetes Program [phone] (Team Pager from 8am to [place])

## 2021-11-14 NOTE — Progress Notes (Signed)
Cardiology Consultation   Patient ID: Ana [name] MRN: [id number]; DOB: November 08, 1970  Admit date: 11/13/2021 Date of Consult: 11/14/2021  PCP:  System, Provider Not In   [place] [facility] Cardiologist:  Dorris Carnes, MD   Patient Profile:   Ana [name] is a 51 y.o. female with a history of hypertension, type 2 diabetes mellitus, and CKD stage IV who is being seen 11/14/2021 for the evaluation of CHF at the request of Dr. Nevada Crane.  History of Present Illness:   Ana [name] is a 51 year female with the above history who is followed by Dr. Harrington Challenger. Patient was referred to Dr. Harrington Challenger in 08/2020 for further evaluation of chest pressure and shortness of breath when laying down as well as lower extremity edema. She was noted to be volume overloaded on exam and timing of symptoms seemed to coincide with initiation of sodium bicarbonate supplements. Echo shortly after this visit showed LVEF of 60-65% with no regional wall motion abnormalities, moderate LVH, and normal diastolic parameters as well as small to moderate pleural effusion. Patient was last seen by Dr. Harrington Challenger in 05/2021 at which time symptoms had improved with Lasix.  Patient presented to the [facility] ED on 11/13/2021 for further evaluation of shortness of breath and cough. Upon arrival to the ED, patient markedly hypertensive with BP of 201/83. EKG shows normal sinus rhythm with no acute ischemic changes. High-sensitivity troponin negative at 11 >> 11. BNP elevated at 546. Chest x-ray showed chronic cardiomegaly with acute pulmonary interstitial edema. WBC 10.5, Hgb 8.8, Plts 334. Na 136, K 3.7, Glucose 268, BUN 40, Cr 5.15. Venous blood gas showed pH 7.33, pCO2 39, pO2 44, Bicarb 20.6. Urinalysis positive for leukocyte esterase and WBC >50. She was given Bumex and was admitted to [facility] for further management.  Upon arrival to [facility], she was seen by Nephrology was started IV Lasix 120mg  three times daily.   At the time of this  evaluation, patient is very sleep but resting comfortably. She reports worsening dyspnea both at rest and with exertion over the last month. She describes dyspnea with minimal activity (even just standing up) as well as orthopnea and PND. She also reports lower extremity edema and abdominal distension. She denies any chest pain, palpitations, lightheadedness, dizziness, or near syncope/syncope. She has been very tired lately and reports she developed a productive cough yesterday but no other recent fevers or illnesses. No nausea or vomiting. No abnormal bleeding in urine or stools.   She does states she is feeling better after the IV Lasix and thinks her breathing, lower extremity edema, and abdominal distension has improved some.   She denies any history of tobacco abuse. She does have a family history of heart disease and states her mother had CHF and died in her 54s.  Past Medical History:  Diagnosis Date   CKD (chronic kidney disease)    Diabetes mellitus    diet controlled   Hypertension     Past Surgical History:  Procedure Laterality Date   CESAREAN SECTION  x4   CHOLECYSTECTOMY     EYE SURGERY     MASS EXCISION  10/27/2011   Procedure: EXCISION MASS;  Surgeon: Donato Heinz, MD;  Location: [facility];  Service: General;  Laterality: Right;   TUBAL LIGATION       Home Medications:  Prior to Admission medications   Medication Sig Start Date End Date Taking? Authorizing Provider  amLODipine (NORVASC) 10 MG tablet  TAKE 1 TABLET BY MOUTH DAILY Patient taking differently: Take 10 mg by mouth daily. 07/29/21  Yes Paseda, Dewaine Conger, FNP  carvedilol (COREG) 12.5 MG tablet Take 1 tablet (12.5 mg total) by mouth 2 (two) times daily. 01/12/21 11/13/21 Yes Paseda, Dewaine Conger, FNP  furosemide (LASIX) 20 MG tablet TAKE 1 TABLET(20 MG) BY MOUTH DAILY Patient taking differently: Take 20 mg by mouth daily. 10/19/21  Yes Paseda, Dewaine Conger, FNP  levothyroxine (SYNTHROID) 50 MCG tablet TAKE 1  TABLET(50 MCG) BY MOUTH DAILY Patient taking differently: Take 50 mcg by mouth daily. 03/14/21  Yes Paseda, Dewaine Conger, FNP  sodium bicarbonate 650 MG tablet Take 650 mg by mouth 4 (four) times daily.   Yes [provider]  glipiZIDE (GLUCOTROL) 10 MG tablet Take 1 tablet(10mg )  by mouth twice daily Patient not taking: Reported on 11/13/2021 10/19/21   Renee Rival, FNP  hydrALAZINE (APRESOLINE) 50 MG tablet Take 1 tablet (50 mg total) by mouth 2 (two) times daily. Patient not taking: Reported on 11/13/2021 01/24/21   Renee Rival, FNP  losartan (COZAAR) 100 MG tablet TAKE 1 TABLET(100 MG) BY MOUTH DAILYTAKE 1 TABLET(100 MG) BY MOUTH DAILY Patient not taking: Reported on 11/13/2021 10/19/21   Renee Rival, FNP  methocarbamol (ROBAXIN) 500 MG tablet Take 1 tablet (500 mg total) by mouth 2 (two) times daily as needed for muscle spasms. Patient not taking: Reported on 11/13/2021 09/30/21   Noemi Chapel, MD  naproxen (NAPROSYN) 500 MG tablet Take 1 tablet (500 mg total) by mouth 2 (two) times daily with a meal. Patient not taking: Reported on 11/13/2021 09/30/21   Noemi Chapel, MD  rosuvastatin (CRESTOR) 40 MG tablet Take 1 tablet (40 mg total) by mouth daily with supper. Patient not taking: Reported on 11/13/2021 06/21/21 06/16/22  Fay Records, MD    Inpatient Medications: Scheduled Meds:  amLODipine  10 mg Oral Daily   carvedilol  12.5 mg Oral BID   heparin  5,000 Units Subcutaneous Q8H   hydrALAZINE  50 mg Oral BID   insulin aspart  0-6 Units Subcutaneous TID WC   levothyroxine  50 mcg Oral Daily   rosuvastatin  40 mg Oral Q supper   sodium bicarbonate  650 mg Oral QID   sodium chloride flush  3 mL Intravenous Q12H   Continuous Infusions:  cefTRIAXone (ROCEPHIN)  IV Stopped (11/13/21 1322)   furosemide 120 mg (11/14/21 1138)   PRN Meds: acetaminophen **OR** acetaminophen, bisacodyl, hydrALAZINE, HYDROmorphone (DILAUDID) injection, ipratropium, levalbuterol,  ondansetron **OR** ondansetron (ZOFRAN) IV, oxyCODONE, senna-docusate, traZODone  Allergies:    Allergies  Allergen Reactions   Ranitidine Rash   Ranitidine Hcl Rash    Social History:   Social History   Socioeconomic History   Marital status: Single    Spouse name: Not on file   Number of children: Not on file   Years of education: Not on file   Highest education level: Not on file  Occupational History   Not on file  Tobacco Use   Smoking status: Never   Smokeless tobacco: Never  Vaping Use   Vaping Use: Never used  Substance and Sexual Activity   Alcohol use: Not Currently    Comment: occ   Drug use: No   Sexual activity: Yes    Birth control/protection: Surgical  Other Topics Concern   Not on file  Social History Narrative   Divorced,married for 8 years.Lives with daughter and 3 grandkids.Unemployed.   Social Determinants of  Health   Financial Resource Strain: Not on file  Food Insecurity: No Food Insecurity (11/14/2021)   Hunger Vital Sign    Worried About Running Out of Food in the Last Year: Never true    Ran Out of Food in the Last Year: Never true  Transportation Needs: No Transportation Needs (11/14/2021)   PRAPARE - Hydrologist (Medical): No    Lack of Transportation (Non-Medical): No  Physical Activity: Not on file  Stress: Not on file  Social Connections: Not on file  Intimate Partner Violence: Not At Risk (11/14/2021)   Humiliation, Afraid, Rape, and Kick questionnaire    Fear of Current or Ex-Partner: No    Emotionally Abused: No    Physically Abused: No    Sexually Abused: No    Family History:   Family History  Problem Relation Age of Onset   Kidney failure Mother    Diabetes Mother    Kidney disease Mother    Kidney failure Father    Kidney disease Father    Kidney failure Brother    Diabetes Brother    Diabetes Brother      ROS:  Please see the history of present illness.  Review of Systems   Constitutional:  Negative for fever.  HENT:  Negative for congestion.   Respiratory:  Positive for cough, sputum production and shortness of breath. Negative for hemoptysis.   Cardiovascular:  Positive for orthopnea, leg swelling and PND. Negative for chest pain and palpitations.  Gastrointestinal:  Negative for blood in stool, melena, nausea and vomiting.  Genitourinary:  Negative for hematuria.  Musculoskeletal:  Negative for myalgias.  Neurological:  Negative for dizziness and loss of consciousness.  Endo/Heme/Allergies:  Does not bruise/bleed easily.  Psychiatric/Behavioral:  Negative for substance abuse.    Physical Exam/Data:   Vitals:   11/14/21 0454 11/14/21 0836 11/14/21 1118 11/14/21 1237  BP: (!) 141/72 (!) 159/75 (!) 152/63 (!) 151/69  Pulse: 81 81 87 82  Resp: 20 19  18   Temp: 97.9 F (36.6 C) 97.8 F (36.6 C)  97.9 F (36.6 C)  TempSrc: Oral Oral  Oral  SpO2: 100% 99%  97%  Weight:      Height:        Intake/Output Summary (Last 24 hours) at 11/14/2021 1303 Last data filed at 11/14/2021 1152 Gross per 24 hour  Intake 587 ml  Output 2150 ml  Net -1563 ml      11/13/2021    9:16 AM 09/30/2021    2:58 PM 06/17/2021    1:10 PM  Last 3 Weights  Weight (lbs) 200 lb 185 lb 195 lb 12.8 oz  Weight (kg) 90.719 kg 83.915 kg 88.814 kg     Body mass index is 39.06 kg/m.  General: 51 y.o. obese African=American female resting comfortably in no acute distress. HEENT: Normocephalic and atraumatic. Sclera clear.  Neck: Supple. JVD elevated. Heart: RRR. Distinct S1 and S2. No murmurs, gallops, or rubs.  Lungs: No increased work of breathing. Decreased breath sounds noted in bilateral bases (right  > left) but no significant wheezes, rhonchi, or rales appreciated.  Abdomen: Soft, non-distended, and non-tender to palpation. Bowel sounds present. Extremities: 1+ pitting edema of bilateral lower extremities. Skin: Warm and dry. Neuro: Alert and oriented x3. No focal  deficits. Psych: Normal affect. Responds appropriately.   EKG:  The EKG was personally reviewed and demonstrates:  Normal sinus rhythm, rate 95 bpm, with no acute ST/T changes.  Telemetry:  Telemetry was personally reviewed and demonstrates:  Normal sinus rhythm with rates in the 80s to 90s.  Relevant CV Studies:  Echocardiogram 11/14/2021: Impression: 1. Left ventricular ejection fraction, by estimation, is 60 to 65%. The  left ventricle has normal function. The left ventricle has no regional  wall motion abnormalities. Left ventricular diastolic parameters are  consistent with Grade II diastolic  dysfunction (pseudonormalization). Elevated left ventricular end-diastolic  pressure.   2. Right ventricular systolic function was not well visualized. The right  ventricular size is not well visualized. Tricuspid regurgitation signal is  inadequate for assessing PA pressure.   3. Left atrial size was moderately dilated.   4. A small pericardial effusion is present. The pericardial effusion is  circumferential. There is no evidence of cardiac tamponade.   5. The mitral valve is normal in structure. No evidence of mitral valve  regurgitation. No evidence of mitral stenosis.   6. The aortic valve is tricuspid. Aortic valve regurgitation is not  visualized. Aortic valve sclerosis/calcification is present, without any  evidence of aortic stenosis.    Laboratory Data:  High Sensitivity Troponin:   Recent Labs  Lab 11/13/21 0901 11/13/21 1050  TROPONINIHS 11 11     Chemistry Recent Labs  Lab 11/13/21 0901 11/13/21 1118 11/14/21 0425  NA 136  --  138  K 3.7  --  3.7  CL 107  --  108  CO2 19*  --  21*  GLUCOSE 268*  --  230*  BUN 40*  --  42*  CREATININE 5.15*  --  5.41*  CALCIUM 8.5*  --  8.6*  MG  --  2.0  --   GFRNONAA 10*  --  9*  ANIONGAP 10  --  9    No results for input(s): "PROT", "ALBUMIN", "AST", "ALT", "ALKPHOS", "BILITOT" in the last 168 hours. Lipids No  results for input(s): "CHOL", "TRIG", "HDL", "LABVLDL", "LDLCALC", "CHOLHDL" in the last 168 hours.  Hematology Recent Labs  Lab 11/13/21 0901 11/14/21 0425  WBC 10.5 10.2  RBC 3.11* 2.85*  HGB 8.8* 7.9*  HCT 26.9* 24.6*  MCV 86.5 86.3  MCH 28.3 27.7  MCHC 32.7 32.1  RDW 13.3 13.2  PLT 334 306   Thyroid  Recent Labs  Lab 11/13/21 1013  TSH 3.795    BNP Recent Labs  Lab 11/13/21 0900  BNP 546.0*    DDimer No results for input(s): "DDIMER" in the last 168 hours.   Radiology/Studies:  ECHOCARDIOGRAM COMPLETE  Result Date: 11/14/2021    ECHOCARDIOGRAM REPORT   Patient Name:   JAYELLE PAGE Date of Exam: 11/14/2021 Medical Rec #:  [id number]      Height:       60.0 in Accession #:    [id number]     Weight:       200.0 lb Date of Birth:  1970-07-18      BSA:          1.867 m Patient Age:    73 years       BP:           159/75 mmHg Patient Gender: F              HR:           84 bpm. Exam Location:  Inpatient Procedure: 2D Echo, Cardiac Doppler, Color Doppler and Intracardiac            Opacification Agent Indications:    CHF/Pericardial Effusion  History:  Patient has prior history of Echocardiogram examinations, most                 recent 09/16/2020. Risk Factors:Hypertension, Dyslipidemia and                 Diabetes. CKD 3b.  Sonographer:    Eartha Inch Referring Phys: [id number] SEYED A SHAHMEHDI  Sonographer Comments: Technically difficult study due to poor echo windows. Image acquisition challenging due to patient body habitus and Image acquisition challenging due to respiratory motion. Small pericardial effusion noted. IMPRESSIONS  1. Left ventricular ejection fraction, by estimation, is 60 to 65%. The left ventricle has normal function. The left ventricle has no regional wall motion abnormalities. Left ventricular diastolic parameters are consistent with Grade II diastolic dysfunction (pseudonormalization). Elevated left ventricular end-diastolic pressure.  2. Right  ventricular systolic function was not well visualized. The right ventricular size is not well visualized. Tricuspid regurgitation signal is inadequate for assessing PA pressure.  3. Left atrial size was moderately dilated.  4. A small pericardial effusion is present. The pericardial effusion is circumferential. There is no evidence of cardiac tamponade.  5. The mitral valve is normal in structure. No evidence of mitral valve regurgitation. No evidence of mitral stenosis.  6. The aortic valve is tricuspid. Aortic valve regurgitation is not visualized. Aortic valve sclerosis/calcification is present, without any evidence of aortic stenosis. FINDINGS  Left Ventricle: Left ventricular ejection fraction, by estimation, is 60 to 65%. The left ventricle has normal function. The left ventricle has no regional wall motion abnormalities. Definity contrast agent was given IV to delineate the left ventricular  endocardial borders. The left ventricular internal cavity size was normal in size. There is no left ventricular hypertrophy. Left ventricular diastolic parameters are consistent with Grade II diastolic dysfunction (pseudonormalization). Elevated left ventricular end-diastolic pressure. Right Ventricle: The right ventricular size is not well visualized. Right vetricular wall thickness was not assessed. Right ventricular systolic function was not well visualized. Tricuspid regurgitation signal is inadequate for assessing PA pressure. Left Atrium: Left atrial size was moderately dilated. Right Atrium: Right atrial size was normal in size. Pericardium: A small pericardial effusion is present. The pericardial effusion is circumferential. There is no evidence of cardiac tamponade. Mitral Valve: The mitral valve is normal in structure. Mild mitral annular calcification. No evidence of mitral valve regurgitation. No evidence of mitral valve stenosis. MV peak gradient, 8.2 mmHg. The mean mitral valve gradient is 4.0 mmHg.  Tricuspid Valve: The tricuspid valve is normal in structure. Tricuspid valve regurgitation is not demonstrated. No evidence of tricuspid stenosis. Aortic Valve: The aortic valve is tricuspid. Aortic valve regurgitation is not visualized. Aortic valve sclerosis/calcification is present, without any evidence of aortic stenosis. Pulmonic Valve: The pulmonic valve was normal in structure. Pulmonic valve regurgitation is mild. No evidence of pulmonic stenosis. Aorta: The aortic root is normal in size and structure. Venous: The inferior vena cava was not well visualized. IAS/Shunts: No atrial level shunt detected by color flow Doppler.  LEFT VENTRICLE PLAX 2D LVIDd:         4.20 cm   Diastology LVIDs:         2.80 cm   LV e' medial:    6.57 cm/s LV PW:         1.10 cm   LV E/e' medial:  17.8 LV IVS:        1.00 cm   LV e' lateral:   8.55 cm/s LVOT diam:  1.55 cm   LV E/e' lateral: 13.7 LV SV:         42 LV SV Index:   23 LVOT Area:     1.89 cm  RIGHT VENTRICLE RV S prime:     10.40 cm/s LEFT ATRIUM           Index LA diam:      4.30 cm 2.30 cm/m LA Vol (A2C): 49.9 ml 26.73 ml/m LA Vol (A4C): 85.6 ml 45.86 ml/m  AORTIC VALVE LVOT Vmax:   112.00 cm/s LVOT Vmean:  88.900 cm/s LVOT VTI:    0.224 m  AORTA Ao Root diam: 2.30 cm Ao Asc diam:  3.00 cm MITRAL VALVE MV Area (PHT): 3.91 cm     SHUNTS MV Area VTI:   1.19 cm     Systemic VTI:  0.22 m MV Peak grad:  8.2 mmHg     Systemic Diam: 1.55 cm MV Mean grad:  4.0 mmHg MV Vmax:       1.43 m/s MV Vmean:      93.9 cm/s MV Decel Time: 194 msec MV E velocity: 117.00 cm/s MV A velocity: 122.00 cm/s MV E/A ratio:  0.96 Fransico Him MD Electronically signed by Fransico Him MD Signature Date/Time: 11/14/2021/11:32:09 AM    Final    US RENAL  Result Date: 11/13/2021 CLINICAL DATA:  Acute renal failure EXAM: RENAL / URINARY TRACT ULTRASOUND COMPLETE COMPARISON:  06/14/2020 FINDINGS: Right Kidney: Renal measurements: 11.0 x 5.7 x 5.7 cm = volume: 187.8 ML. Echogenicity within  normal limits. No mass or hydronephrosis visualized. Left Kidney: Renal measurements: 10.8 x 6.6 x 5.9 cm = volume: 217.7 mL. Echogenicity within normal limits. No mass or hydronephrosis visualized. Bladder: Appears normal for degree of bladder distention. Other: None. IMPRESSION: 1. Unremarkable renal ultrasound. Electronically Signed   By: Randa Ngo M.D.   On: 11/13/2021 22:49   DG Chest 2 View  Result Date: 11/13/2021 CLINICAL DATA:  51 year old female with shortness of breath and cough. EXAM: CHEST - 2 VIEW COMPARISON:  Chest radiograph 09/02/2020 and earlier. FINDINGS: Upright AP and lateral views of the chest at 0921 hours. Chronic cardiomegaly. Mildly lower lung volumes. There is trace pleural fluid in the fissures on the lateral view. Increased pulmonary vascularity compared to prior exams. No layering pleural effusion, pneumothorax, or consolidation. Visualized tracheal air column is within normal limits. No acute osseous abnormality identified. Stable cholecystectomy clips. Paucity of bowel gas in the visible abdomen. IMPRESSION: Chronic cardiomegaly with acute pulmonary interstitial edema. Electronically Signed   By: Genevie Ann M.D.   On: 11/13/2021 09:31     Assessment and Plan:   Acute Diastolic CHF Patient presented with progressive dyspnea on exertion, orthopnea, PND, lower extremity edema, and abdominal distension over the past month. BNP elevated at 546. Chest x-ray showed chronic cardiomegaly with acute pulmonary interstitial edema. Echo today showed LVEF of 60-65% with no regional wall motion abnormalities and grade 2 diastolic dysfunction as well as small pericardial effusion. Creatinine 5.15 on admission. Nephrology and started IV Lasix 120mg  every 8 hours. Net negative 1.5 L this admission. No updated weights today. Creatinine up from 5.15 to 5.41. - Volume overloaded one exam. - Continue diuresis. Will defer dosing of this to Nephrology given renal function. - Continue to  monitor daily weights, strict I/O's, and renal function.   Hypertension BP markedly elevated at 201/83 on admission. Much improved but still mildly elevated. - Continue Amlodipine 10mg  daily. - Will increase Coreg to 25mg   twice daily. - Continue Hydralazine 50mg  twice daily. - Home Losartan held due to AKI.  Type 2 Diabetes Mellitus - Management per primary team.  Acute on CKD Stage IV Creatinine 5.15 on admission, up from 3.75 in 05/2021 and 2.16 in 09/2020. Renal ultrasound in 9/17 was unremarkable. - Management per Nephrology.  Otherwise, per primary team: - Sepsis secondary UTI - Hypothyroidism   Risk Assessment/Risk Scores:    New York Heart Association (NYHA) Functional Class NYHA Class IV   For questions or updates, please contact [place] Please consult [email or web address] for contact info under    Signed, Darreld Mclean, PA-C  11/14/2021 1:03 PM

## 2021-11-15 DIAGNOSIS — E785 Hyperlipidemia, unspecified: Secondary | ICD-10-CM

## 2021-11-15 DIAGNOSIS — N179 Acute kidney failure, unspecified: Secondary | ICD-10-CM | POA: Diagnosis not present

## 2021-11-15 DIAGNOSIS — I5030 Unspecified diastolic (congestive) heart failure: Secondary | ICD-10-CM | POA: Diagnosis not present

## 2021-11-15 DIAGNOSIS — R0602 Shortness of breath: Secondary | ICD-10-CM | POA: Diagnosis not present

## 2021-11-15 DIAGNOSIS — A419 Sepsis, unspecified organism: Secondary | ICD-10-CM | POA: Diagnosis not present

## 2021-11-15 DIAGNOSIS — E1169 Type 2 diabetes mellitus with other specified complication: Secondary | ICD-10-CM

## 2021-11-15 DIAGNOSIS — N250 Renal osteodystrophy: Secondary | ICD-10-CM | POA: Diagnosis not present

## 2021-11-15 DIAGNOSIS — J9601 Acute respiratory failure with hypoxia: Secondary | ICD-10-CM | POA: Diagnosis not present

## 2021-11-15 DIAGNOSIS — I130 Hypertensive heart and chronic kidney disease with heart failure and stage 1 through stage 4 chronic kidney disease, or unspecified chronic kidney disease: Secondary | ICD-10-CM | POA: Diagnosis not present

## 2021-11-15 DIAGNOSIS — N184 Chronic kidney disease, stage 4 (severe): Secondary | ICD-10-CM | POA: Diagnosis not present

## 2021-11-15 DIAGNOSIS — D649 Anemia, unspecified: Secondary | ICD-10-CM | POA: Diagnosis not present

## 2021-11-15 DIAGNOSIS — N390 Urinary tract infection, site not specified: Secondary | ICD-10-CM | POA: Diagnosis not present

## 2021-11-15 DIAGNOSIS — E039 Hypothyroidism, unspecified: Secondary | ICD-10-CM | POA: Diagnosis not present

## 2021-11-15 DIAGNOSIS — I10 Essential (primary) hypertension: Secondary | ICD-10-CM | POA: Diagnosis not present

## 2021-11-15 DIAGNOSIS — I5033 Acute on chronic diastolic (congestive) heart failure: Secondary | ICD-10-CM | POA: Diagnosis not present

## 2021-11-15 DIAGNOSIS — N1832 Chronic kidney disease, stage 3b: Secondary | ICD-10-CM | POA: Diagnosis not present

## 2021-11-15 LAB — CBC
HCT: 22.6 % — ABNORMAL LOW (ref 36.0–46.0)
Hemoglobin: 7.6 g/dL — ABNORMAL LOW (ref 12.0–15.0)
MCH: 28.5 pg (ref 26.0–34.0)
MCHC: 33.6 g/dL (ref 30.0–36.0)
MCV: 84.6 fL (ref 80.0–100.0)
Platelets: 292 10*3/uL (ref 150–400)
RBC: 2.67 MIL/uL — ABNORMAL LOW (ref 3.87–5.11)
RDW: 13.5 % (ref 11.5–15.5)
WBC: 9.5 10*3/uL (ref 4.0–10.5)
nRBC: 0 % (ref 0.0–0.2)

## 2021-11-15 LAB — BASIC METABOLIC PANEL
Anion gap: 9 (ref 5–15)
BUN: 45 mg/dL — ABNORMAL HIGH (ref 6–20)
CO2: 22 mmol/L (ref 22–32)
Calcium: 8.3 mg/dL — ABNORMAL LOW (ref 8.9–10.3)
Chloride: 103 mmol/L (ref 98–111)
Creatinine, Ser: 5.55 mg/dL — ABNORMAL HIGH (ref 0.44–1.00)
GFR, Estimated: 9 mL/min — ABNORMAL LOW (ref >60)
Glucose, Bld: 181 mg/dL — ABNORMAL HIGH (ref 70–99)
Potassium: 3.4 mmol/L — ABNORMAL LOW (ref 3.5–5.1)
Sodium: 134 mmol/L — ABNORMAL LOW (ref 135–145)

## 2021-11-15 LAB — PROTEIN / CREATININE RATIO, URINE
Creatinine, Urine: 97 mg/dL
Protein Creatinine Ratio: 3.67 mg/mg{Cre} — ABNORMAL HIGH (ref 0.00–0.15)
Total Protein, Urine: 356 mg/dL

## 2021-11-15 LAB — GLUCOSE, CAPILLARY
Glucose-Capillary: 152 mg/dL — ABNORMAL HIGH (ref 70–99)
Glucose-Capillary: 188 mg/dL — ABNORMAL HIGH (ref 70–99)
Glucose-Capillary: 328 mg/dL — ABNORMAL HIGH (ref 70–99)
Glucose-Capillary: 245 mg/dL — ABNORMAL HIGH (ref 70–99)

## 2021-11-15 LAB — PARATHYROID HORMONE, INTACT (NO CA): PTH: 103 pg/mL — ABNORMAL HIGH (ref 15–65)

## 2021-11-15 MED ORDER — SODIUM CHLORIDE 0.9 % IV SOLN
510.0000 mg | Freq: Once | INTRAVENOUS | Status: DC
  Filled 2021-11-15: qty 17

## 2021-11-15 MED ORDER — SODIUM BICARBONATE 650 MG PO TABS
650.0000 mg | ORAL_TABLET | Freq: Two times a day (BID) | ORAL | Status: DC
  Administered 2021-11-15 – 2021-11-16 (×2): 650 mg via ORAL
  Filled 2021-11-15 (×2): qty 1

## 2021-11-15 MED ORDER — POTASSIUM CHLORIDE CRYS ER 20 MEQ PO TBCR
40.0000 meq | EXTENDED_RELEASE_TABLET | Freq: Two times a day (BID) | ORAL | Status: AC
  Administered 2021-11-15 (×2): 40 meq via ORAL
  Filled 2021-11-15 (×2): qty 2

## 2021-11-15 MED ORDER — DARBEPOETIN ALFA 200 MCG/0.4ML IJ SOSY
200.0000 ug | PREFILLED_SYRINGE | INTRAMUSCULAR | Status: DC
  Administered 2021-11-15: 200 ug via SUBCUTANEOUS
  Filled 2021-11-15: qty 0.4

## 2021-11-15 MED ORDER — AMOXICILLIN-POT CLAVULANATE 250-125 MG PO TABS
1.0000 | ORAL_TABLET | Freq: Two times a day (BID) | ORAL | Status: DC
  Administered 2021-11-15 – 2021-11-16 (×2): 1 via ORAL
  Filled 2021-11-15 (×2): qty 1

## 2021-11-15 MED ORDER — FUROSEMIDE 40 MG PO TABS
40.0000 mg | ORAL_TABLET | Freq: Two times a day (BID) | ORAL | Status: DC
  Administered 2021-11-15 – 2021-11-16 (×2): 40 mg via ORAL
  Filled 2021-11-15 (×2): qty 1

## 2021-11-15 MED ORDER — CALCIUM ACETATE (PHOS BINDER) 667 MG PO CAPS
667.0000 mg | ORAL_CAPSULE | Freq: Three times a day (TID) | ORAL | Status: DC
  Administered 2021-11-15 – 2021-11-16 (×3): 667 mg via ORAL
  Filled 2021-11-15 (×3): qty 1

## 2021-11-15 NOTE — Progress Notes (Signed)
Progress Note   [name] WEX:[id number] DOB: 1970/11/02 DOA: 11/13/2021     2 DOS: the patient was seen and examined on 11/15/2021   Brief hospital course:  Ana [name] is a 51 year old female with extensive history of HTN, HLD, dCHF, DM 2, CKD 3b, presenting today ED with chief complaint of shortness of breath, and fatigue.  Patient reports that her symptoms started over a week ago and progressively has been getting worse.  She has difficulty sleeping, requiring multiple pillows now unable to lie flat.  She woke up this morning with significant shortness of breath and increased work of breathing.  But denies any cough or fever/chills.  Denies of your recent illnesses.  Also over the past week noticed worsening lower extremity swelling.    ED Course:   Blood pressure (!) 170/78, pulse 75, temperature 97.6 F (36.4 C), temperature source Oral, resp. rate (!) 25, height 5' (1.524 m), weight 90.7 kg, on 4 L of oxygen SpO2 100 %. Abnormal labs; BUN 40, creatinine 5.15, calcium 8.5, troponin 11,  BNP 546 Chest x-rayIMPRESSION: Chronic cardiomegaly with acute pulmonary interstitial edema. UA positive for leukocyte esterase UA WBC >50   Assessment and Plan: * Acute on chronic diastolic CHF (congestive heart failure) (HCC) Echocardiogram with preserved LV systolic function with EF 60 to 65%, moderate dilatation of LA, small pericardial effusion.   Patient continue to have edema Urine output is documented at 650 ml  Plan to continue diuresis with furosemide Limited pharmacologic options due to reduced GFR.  Continue with carvedilol and blood pressure control with amlodipine plus hydralazine.   Acute hypoxemic respiratory failure due to cardiogenic pulmonary edema.   Acute respiratory failure with hypoxia (HCC) - Likely due to volume overload, cardiorenal syndrome -Continue aggressive diuresing -In ED required up to 4 L of oxygen by nasal cannula currently on 2 L satting  100% -Continue supplemental oxygen, -Monitoring closely -Lasix and nitro drip glycerin drip was started in ED  Acute renal failure superimposed on stage 3b chronic kidney disease (HCC) Hyponatremia and hypokalemia  Renal function with serum cr at 5,5 with K at 3,4 and serum bicarbonate at 22. Na 135.   Plan to continue diuresis with furosemide, follow up renal function in am.   Hyper P continue with phosphate binders Continue with oral bicarbonate.   Anemia of chronic renal disease, continue close follow up on hgb .  Continue with EPO and iron supplementation.   Sepsis secondary to UTI ([place]) Antibiotic therapy with augmentin   Essential hypertension, benign Continue blood pressure control with amlodipine, carvedilol and hydralazine   Hypothyroidism, adult - Continue home dose Synthroid  Type 2 diabetes mellitus with hyperlipidemia ([place]) Continue glucose cover and monitoring with insulin sliding scale. Fasting glucose this am was 181.   Continue with statin therapy.   Mixed hyperlipidemia - Continue statins- rosuvastatin        Subjective: Patient with improvement in edema but not back to baseline   Physical Exam: Vitals:   11/14/21 2122 11/14/21 2218 11/15/21 0602 11/15/21 0900  BP: (!) 145/60 (!) 130/52 (!) 148/63 (!) 152/23  Pulse: 91 87 98 92  Resp:  18 20   Temp:  98.2 F (36.8 C) 98.6 F (37 C)   TempSrc:  Oral Oral   SpO2:  97% 95%   Weight:   93.7 kg   Height:       Neurology awake and alert ENT With mild pallor Cardiovascular with S1 and S2 present  and rhythmic with no gallops Respiratory with no rales or wheezing Abdomen with no distention Positive lower extremity edema ++  Data Reviewed:    Family Communication: I spoke with patient's children at the bedside, we talked in detail about patient's condition, plan of care and prognosis and all questions were addressed.   Disposition: Status is: Inpatient Remains inpatient appropriate  because: renal failure   Planned Discharge Destination: Home  Author: Tawni Millers, MD 11/15/2021 5:02 PM  For on call review [email or web address].

## 2021-11-15 NOTE — Assessment & Plan Note (Addendum)
Echocardiogram with preserved LV systolic function with EF 60 to 65%, moderate dilatation of LA, small pericardial effusion.   Total fluid balance since admission is negative 2,230 ml.   Plan to continue diuresis with furosemide po at increase dose of 40 mg bid.  Limited pharmacologic options due to reduced GFR.  Continue with carvedilol and blood pressure control with amlodipine plus hydralazine.   Acute hypoxemic respiratory failure due to cardiogenic pulmonary edema.  At the time of her discharge her 02 saturation is 95% on room air.

## 2021-11-15 NOTE — Inpatient Diabetes Management (Signed)
Inpatient Diabetes Program Recommendations  AACE/ADA: New Consensus Statement on Inpatient Glycemic Control   Target Ranges:  Prepandial:   less than 140 mg/dL      Peak postprandial:   less than 180 mg/dL (1-2 hours)      Critically ill patients:  140 - 180 mg/dL    Latest Reference Range & Units 11/14/21 08:43 11/14/21 12:45 11/14/21 16:21 11/14/21 21:30 11/15/21 08:36  Glucose-Capillary 70 - 99 mg/dL 206 (H)  Novolog 2 units 294 (H)  Novolog 3 units 205 (H)  Novolog 2 units 114 (H) 152 (H)  Novolog 1 unit   Review of Glycemic Control  Diabetes history: DM2 Outpatient Diabetes medications: Glipizide 10 mg BID Current orders for Inpatient glycemic control: Novolog 0-6 units TID with meals   Inpatient Diabetes Program Recommendations:     Insulin: Please consider increasing Novolog correction to 0-9 units TID with meals and at HS.    Diet: Please consider discontinuing Regular diet and ordering Carb Modified diet.   Thanks, Barnie Alderman, RN, MSN, [facility] Diabetes Coordinator Inpatient Diabetes Program [phone] (Team Pager from 8am to [place])

## 2021-11-15 NOTE — Plan of Care (Signed)
  Problem: Education: Goal: Ability to describe self-care measures that may prevent or decrease complications (Diabetes Survival Skills Education) will improve Outcome: Progressing Goal: Individualized Educational Video(s) Outcome: Progressing   Problem: Coping: Goal: Ability to adjust to condition or change in health will improve Outcome: Progressing   Problem: Fluid Volume: Goal: Ability to maintain a balanced intake and output will improve Outcome: Progressing   Problem: Health Behavior/Discharge Planning: Goal: Ability to identify and utilize available resources and services will improve Outcome: Progressing Goal: Ability to manage health-related needs will improve Outcome: Progressing   Problem: Metabolic: Goal: Ability to maintain appropriate glucose levels will improve Outcome: Progressing   Problem: Nutritional: Goal: Maintenance of adequate nutrition will improve Outcome: Progressing Goal: Progress toward achieving an optimal weight will improve Outcome: Progressing   Problem: Skin Integrity: Goal: Risk for impaired skin integrity will decrease Outcome: Progressing   Problem: Tissue Perfusion: Goal: Adequacy of tissue perfusion will improve Outcome: Progressing   Problem: Education: Goal: Knowledge of General Education information will improve Description: Including pain rating scale, medication(s)/side effects and non-pharmacologic comfort measures Outcome: Progressing   Problem: Health Behavior/Discharge Planning: Goal: Ability to manage health-related needs will improve Outcome: Progressing   Problem: Clinical Measurements: Goal: Ability to maintain clinical measurements within normal limits will improve Outcome: Progressing Goal: Will remain free from infection Outcome: Progressing Goal: Diagnostic test results will improve Outcome: Progressing Goal: Respiratory complications will improve Outcome: Progressing Goal: Cardiovascular complication will  be avoided Outcome: Progressing   Problem: Activity: Goal: Risk for activity intolerance will decrease Outcome: Progressing   Problem: Nutrition: Goal: Adequate nutrition will be maintained Outcome: Progressing   Problem: Coping: Goal: Level of anxiety will decrease Outcome: Progressing   Problem: Elimination: Goal: Will not experience complications related to bowel motility Outcome: Progressing Goal: Will not experience complications related to urinary retention Outcome: Progressing   Problem: Pain Managment: Goal: General experience of comfort will improve Outcome: Progressing   Problem: Safety: Goal: Ability to remain free from injury will improve Outcome: Progressing   Problem: Skin Integrity: Goal: Risk for impaired skin integrity will decrease Outcome: Progressing   Problem: Education: Goal: Ability to demonstrate management of disease process will improve Outcome: Progressing Goal: Ability to verbalize understanding of medication therapies will improve Outcome: Progressing Goal: Individualized Educational Video(s) Outcome: Progressing   Problem: Activity: Goal: Capacity to carry out activities will improve Outcome: Progressing   Problem: Cardiac: Goal: Ability to achieve and maintain adequate cardiopulmonary perfusion will improve Outcome: Progressing   

## 2021-11-15 NOTE — Progress Notes (Signed)
PHARMACY NOTE:  ANTIMICROBIAL RENAL DOSAGE ADJUSTMENT  Current antimicrobial regimen includes a mismatch between antimicrobial dosage and estimated renal function.  As per policy approved by the Pharmacy & Therapeutics and Medical Executive Committees, the antimicrobial dosage will be adjusted accordingly.  Current antimicrobial dosage:  Augmentin 875mg  po bid for 5 days  Indication: UTI  Renal Function:  Estimated Creatinine Clearance: 12.3 mL/min (A) (by C-G formula based on SCr of 5.55 mg/dL (H)).    Antimicrobial dosage has been changed to:  Augmentin 250 mg po bid for 5 days  Additional comments:   Thank you for allowing pharmacy to be a part of this patient's care.  Hildred Laser, PharmD Clinical Pharmacist **Pharmacist phone directory can now be found on [email or web address] ([email or web address]).  Listed under [place].

## 2021-11-15 NOTE — Assessment & Plan Note (Addendum)
Hyponatremia, hypomagnesemia and hypokalemia  Renal US with no mass or hydronephrosis, no renal atrophy. Urine protein creatinine ration is 3,67   Her volume has improved, no dyspnea but continue to have lower extremity edema.  Weight is up 3 kg.   Renal function with serum cr up to 6,12 with K at 4,1 and serum bicarbonate at 21. BUN 47 Mg is 1,7   On furosemide 40 mg po bid.  Continue supportive medical therapy for renal failure. No acute indications for renal replacement therapy.  Add 2 g mag sulfate before her discharge.  Hyper P continue with phosphate binders Continue with oral bicarbonate.   Anemia of chronic renal disease, continue close follow up on hgb .  Continue with EPO and iron supplementation.

## 2021-11-15 NOTE — Progress Notes (Signed)
Mobility Specialist - Progress Note   11/15/21 1600  Mobility  Activity Ambulated with assistance in hallway  Level of Assistance Contact guard assist, steadying assist  Assistive Device None  Distance Ambulated (ft) 50 ft  Activity Response Tolerated fair  $Mobility charge 1 Mobility   Pt was received at EOB and agreeable to mobility.Session limited due to c/o LLE and back pain. Pt was returned to EOB with all needs met.   Larey Seat

## 2021-11-15 NOTE — Progress Notes (Signed)
Subjective:  not quite as much urine recorded last 24 hours-  weight is up ?  Kidney numbers about the same  Objective Vital signs in last 24 hours: Vitals:   11/14/21 2122 11/14/21 2218 11/15/21 0602 11/15/21 0900  BP: (!) 145/60 (!) 130/52 (!) 148/63 (!) 152/23  Pulse: 91 87 98 92  Resp:  18 20   Temp:  98.2 F (36.8 C) 98.6 F (37 C)   TempSrc:  Oral Oral   SpO2:  97% 95%   Weight:   93.7 kg   Height:       Weight change: 2.994 kg  Intake/Output Summary (Last 24 hours) at 11/15/2021 1225 Last data filed at 11/15/2021 0616 Gross per 24 hour  Intake 224 ml  Output 300 ml  Net -76 ml    Assessment/ Plan: Pt is a 51 y.o. yo female with DM, HTN, stage 4 CKD who was admitted on 11/13/2021 with  volume overload and worsening  creatinine  Assessment/Plan: 1. Renal-  known CKD-  crt 2-2.2 in July/August 2022 and 3.75 in April of 23 followed by Dr. Theador Hawthorne.  Now presents with volume overload in the setting of running out of lasix-  crt 5.1.  Unfotunately this just appears to be fairly rapid progression of CKD in the setting of DM.  Serologies have been checked in the past and were negative.  Interestingly the renal ultrasound shows fairly normal appearing kidneys-  urine showing proteinuria and hematuria-  wanting to quantify protein but has not been resulted yet.  Have held the ARB.  Crt slightly worse than yesterday but indication for dialysis  2. HTN/vol-  overloaded-  has responded to iv lasix 120 q 8-  pt very much symptomatically improved -  also on norvasc/coreg and hydralazine  -  she continues to say she is much better even though objective info does not reveal too much diuresis-  onlly on lasix 40 daily at home-  will change to PO lasix 40 BID today -  if cont to be clinically improved and numbers dont change too much could maybe go home tomorrow  3. Anemia-  hgb in the 7's- iron low-  giving iron -  got ferahema 9/17-  will give another dose tomorrow-  will also dose ESA 4.  Metabolic acidosis- on bicarb -   continue   [facility]   Labs: Basic Metabolic Panel: Recent Labs  Lab 11/13/21 0901 11/13/21 1118 11/14/21 0425 11/15/21 0548  NA 136  --  138 134*  K 3.7  --  3.7 3.4*  CL 107  --  108 103  CO2 19*  --  21* 22  GLUCOSE 268*  --  230* 181*  BUN 40*  --  42* 45*  CREATININE 5.15*  --  5.41* 5.55*  CALCIUM 8.5*  --  8.6* 8.3*  PHOS  --  4.6 4.9*  --    Liver Function Tests: No results for input(s): "AST", "ALT", "ALKPHOS", "BILITOT", "PROT", "ALBUMIN" in the last 168 hours. No results for input(s): "LIPASE", "AMYLASE" in the last 168 hours. No results for input(s): "AMMONIA" in the last 168 hours. CBC: Recent Labs  Lab 11/13/21 0901 11/14/21 0425 11/15/21 0548  WBC 10.5 10.2 9.5  HGB 8.8* 7.9* 7.6*  HCT 26.9* 24.6* 22.6*  MCV 86.5 86.3 84.6  PLT 334 306 292   Cardiac Enzymes: No results for input(s): "CKTOTAL", "CKMB", "CKMBINDEX", "TROPONINI" in the last 168 hours. CBG: Recent Labs  Lab 11/14/21 1245 11/14/21  1621 11/14/21 2130 11/15/21 0836 11/15/21 1211  GLUCAP 294* 205* 114* 152* 188*    Iron Studies:  Recent Labs    11/13/21 1013  IRON 46  TIBC 345  FERRITIN 81   Studies/Results: ECHOCARDIOGRAM COMPLETE  Result Date: 11/14/2021    ECHOCARDIOGRAM REPORT   Patient Name:   Ana [name] Date of Exam: 11/14/2021 Medical Rec #:  [id number]      Height:       60.0 in Accession #:    [id number]     Weight:       200.0 lb Date of Birth:  10-25-1970      BSA:          1.867 m Patient Age:    82 years       BP:           159/75 mmHg Patient Gender: F              HR:           84 bpm. Exam Location:  Inpatient Procedure: 2D Echo, Cardiac Doppler, Color Doppler and Intracardiac            Opacification Agent Indications:    CHF/Pericardial Effusion  History:        Patient has prior history of Echocardiogram examinations, most                 recent 09/16/2020. Risk Factors:Hypertension, Dyslipidemia and                  Diabetes. CKD 3b.  Sonographer:    Eartha Inch Referring Phys: [phone] SEYED A SHAHMEHDI  Sonographer Comments: Technically difficult study due to poor echo windows. Image acquisition challenging due to patient body habitus and Image acquisition challenging due to respiratory motion. Small pericardial effusion noted. IMPRESSIONS  1. Left ventricular ejection fraction, by estimation, is 60 to 65%. The left ventricle has normal function. The left ventricle has no regional wall motion abnormalities. Left ventricular diastolic parameters are consistent with Grade II diastolic dysfunction (pseudonormalization). Elevated left ventricular end-diastolic pressure.  2. Right ventricular systolic function was not well visualized. The right ventricular size is not well visualized. Tricuspid regurgitation signal is inadequate for assessing PA pressure.  3. Left atrial size was moderately dilated.  4. A small pericardial effusion is present. The pericardial effusion is circumferential. There is no evidence of cardiac tamponade.  5. The mitral valve is normal in structure. No evidence of mitral valve regurgitation. No evidence of mitral stenosis.  6. The aortic valve is tricuspid. Aortic valve regurgitation is not visualized. Aortic valve sclerosis/calcification is present, without any evidence of aortic stenosis. FINDINGS  Left Ventricle: Left ventricular ejection fraction, by estimation, is 60 to 65%. The left ventricle has normal function. The left ventricle has no regional wall motion abnormalities. Definity contrast agent was given IV to delineate the left ventricular  endocardial borders. The left ventricular internal cavity size was normal in size. There is no left ventricular hypertrophy. Left ventricular diastolic parameters are consistent with Grade II diastolic dysfunction (pseudonormalization). Elevated left ventricular end-diastolic pressure. Right Ventricle: The right ventricular size is not well visualized. Right  vetricular wall thickness was not assessed. Right ventricular systolic function was not well visualized. Tricuspid regurgitation signal is inadequate for assessing PA pressure. Left Atrium: Left atrial size was moderately dilated. Right Atrium: Right atrial size was normal in size. Pericardium: A small pericardial effusion is present. The pericardial effusion is circumferential. There is no  evidence of cardiac tamponade. Mitral Valve: The mitral valve is normal in structure. Mild mitral annular calcification. No evidence of mitral valve regurgitation. No evidence of mitral valve stenosis. MV peak gradient, 8.2 mmHg. The mean mitral valve gradient is 4.0 mmHg. Tricuspid Valve: The tricuspid valve is normal in structure. Tricuspid valve regurgitation is not demonstrated. No evidence of tricuspid stenosis. Aortic Valve: The aortic valve is tricuspid. Aortic valve regurgitation is not visualized. Aortic valve sclerosis/calcification is present, without any evidence of aortic stenosis. Pulmonic Valve: The pulmonic valve was normal in structure. Pulmonic valve regurgitation is mild. No evidence of pulmonic stenosis. Aorta: The aortic root is normal in size and structure. Venous: The inferior vena cava was not well visualized. IAS/Shunts: No atrial level shunt detected by color flow Doppler.  LEFT VENTRICLE PLAX 2D LVIDd:         4.20 cm   Diastology LVIDs:         2.80 cm   LV e' medial:    6.57 cm/s LV PW:         1.10 cm   LV E/e' medial:  17.8 LV IVS:        1.00 cm   LV e' lateral:   8.55 cm/s LVOT diam:     1.55 cm   LV E/e' lateral: 13.7 LV SV:         42 LV SV Index:   23 LVOT Area:     1.89 cm  RIGHT VENTRICLE RV S prime:     10.40 cm/s LEFT ATRIUM           Index LA diam:      4.30 cm 2.30 cm/m LA Vol (A2C): 49.9 ml 26.73 ml/m LA Vol (A4C): 85.6 ml 45.86 ml/m  AORTIC VALVE LVOT Vmax:   112.00 cm/s LVOT Vmean:  88.900 cm/s LVOT VTI:    0.224 m  AORTA Ao Root diam: 2.30 cm Ao Asc diam:  3.00 cm MITRAL VALVE MV  Area (PHT): 3.91 cm     SHUNTS MV Area VTI:   1.19 cm     Systemic VTI:  0.22 m MV Peak grad:  8.2 mmHg     Systemic Diam: 1.55 cm MV Mean grad:  4.0 mmHg MV Vmax:       1.43 m/s MV Vmean:      93.9 cm/s MV Decel Time: 194 msec MV E velocity: 117.00 cm/s MV A velocity: 122.00 cm/s MV E/A ratio:  0.96 Fransico Him MD Electronically signed by Fransico Him MD Signature Date/Time: 11/14/2021/11:32:09 AM    Final    US RENAL  Result Date: 11/13/2021 CLINICAL DATA:  Acute renal failure EXAM: RENAL / URINARY TRACT ULTRASOUND COMPLETE COMPARISON:  06/14/2020 FINDINGS: Right Kidney: Renal measurements: 11.0 x 5.7 x 5.7 cm = volume: 187.8 ML. Echogenicity within normal limits. No mass or hydronephrosis visualized. Left Kidney: Renal measurements: 10.8 x 6.6 x 5.9 cm = volume: 217.7 mL. Echogenicity within normal limits. No mass or hydronephrosis visualized. Bladder: Appears normal for degree of bladder distention. Other: None. IMPRESSION: 1. Unremarkable renal ultrasound. Electronically Signed   By: Randa Ngo M.D.   On: 11/13/2021 22:49   Medications: Infusions:  furosemide 120 mg (11/15/21 0918)    Scheduled Medications:  amLODipine  10 mg Oral Daily   amoxicillin-clavulanate  1 tablet Oral Q12H   carvedilol  25 mg Oral BID   heparin  5,000 Units Subcutaneous Q8H   hydrALAZINE  50 mg Oral BID   insulin aspart  0-6 Units Subcutaneous TID WC   levothyroxine  50 mcg Oral Daily   rosuvastatin  40 mg Oral Q supper   sodium bicarbonate  650 mg Oral QID   sodium chloride flush  3 mL Intravenous Q12H    have reviewed scheduled and prn medications.  Physical Exam: General: obese , alert Heart: RRR Lungs: mostly clear Abdomen: obese, soft, non tender Extremities:min peripheral edema     11/15/2021,12:25 PM  LOS: 2 days

## 2021-11-16 ENCOUNTER — Other Ambulatory Visit (HOSPITAL_COMMUNITY): Payer: Self-pay

## 2021-11-16 DIAGNOSIS — D649 Anemia, unspecified: Secondary | ICD-10-CM | POA: Diagnosis not present

## 2021-11-16 DIAGNOSIS — N184 Chronic kidney disease, stage 4 (severe): Secondary | ICD-10-CM | POA: Diagnosis not present

## 2021-11-16 DIAGNOSIS — E039 Hypothyroidism, unspecified: Secondary | ICD-10-CM | POA: Diagnosis not present

## 2021-11-16 DIAGNOSIS — I5030 Unspecified diastolic (congestive) heart failure: Secondary | ICD-10-CM | POA: Diagnosis not present

## 2021-11-16 DIAGNOSIS — I5033 Acute on chronic diastolic (congestive) heart failure: Secondary | ICD-10-CM | POA: Diagnosis not present

## 2021-11-16 DIAGNOSIS — R0602 Shortness of breath: Secondary | ICD-10-CM | POA: Diagnosis not present

## 2021-11-16 DIAGNOSIS — G4489 Other headache syndrome: Secondary | ICD-10-CM | POA: Diagnosis not present

## 2021-11-16 DIAGNOSIS — N179 Acute kidney failure, unspecified: Secondary | ICD-10-CM | POA: Diagnosis not present

## 2021-11-16 DIAGNOSIS — R739 Hyperglycemia, unspecified: Secondary | ICD-10-CM | POA: Diagnosis not present

## 2021-11-16 DIAGNOSIS — E1169 Type 2 diabetes mellitus with other specified complication: Secondary | ICD-10-CM | POA: Diagnosis not present

## 2021-11-16 DIAGNOSIS — R069 Unspecified abnormalities of breathing: Secondary | ICD-10-CM | POA: Diagnosis not present

## 2021-11-16 DIAGNOSIS — N1832 Chronic kidney disease, stage 3b: Secondary | ICD-10-CM | POA: Diagnosis not present

## 2021-11-16 DIAGNOSIS — E785 Hyperlipidemia, unspecified: Secondary | ICD-10-CM | POA: Diagnosis not present

## 2021-11-16 DIAGNOSIS — N250 Renal osteodystrophy: Secondary | ICD-10-CM | POA: Diagnosis not present

## 2021-11-16 DIAGNOSIS — I130 Hypertensive heart and chronic kidney disease with heart failure and stage 1 through stage 4 chronic kidney disease, or unspecified chronic kidney disease: Secondary | ICD-10-CM | POA: Diagnosis not present

## 2021-11-16 DIAGNOSIS — J9601 Acute respiratory failure with hypoxia: Secondary | ICD-10-CM | POA: Diagnosis not present

## 2021-11-16 DIAGNOSIS — R509 Fever, unspecified: Secondary | ICD-10-CM | POA: Diagnosis not present

## 2021-11-16 DIAGNOSIS — I10 Essential (primary) hypertension: Secondary | ICD-10-CM | POA: Diagnosis not present

## 2021-11-16 LAB — GLUCOSE, CAPILLARY
Glucose-Capillary: 225 mg/dL — ABNORMAL HIGH (ref 70–99)
Glucose-Capillary: 271 mg/dL — ABNORMAL HIGH (ref 70–99)

## 2021-11-16 LAB — BASIC METABOLIC PANEL
Anion gap: 14 (ref 5–15)
BUN: 47 mg/dL — ABNORMAL HIGH (ref 6–20)
CO2: 21 mmol/L — ABNORMAL LOW (ref 22–32)
Calcium: 8.9 mg/dL (ref 8.9–10.3)
Chloride: 103 mmol/L (ref 98–111)
Creatinine, Ser: 6.12 mg/dL — ABNORMAL HIGH (ref 0.44–1.00)
GFR, Estimated: 8 mL/min — ABNORMAL LOW (ref >60)
Glucose, Bld: 195 mg/dL — ABNORMAL HIGH (ref 70–99)
Potassium: 4.1 mmol/L (ref 3.5–5.1)
Sodium: 138 mmol/L (ref 135–145)

## 2021-11-16 LAB — MAGNESIUM: Magnesium: 1.7 mg/dL (ref 1.7–2.4)

## 2021-11-16 MED ORDER — FUROSEMIDE 40 MG PO TABS
40.0000 mg | ORAL_TABLET | Freq: Two times a day (BID) | ORAL | 0 refills | Status: DC
  Filled 2021-11-16: qty 60, 30d supply, fill #0

## 2021-11-16 MED ORDER — ACETAMINOPHEN 325 MG PO TABS: 650.0000 mg | ORAL_TABLET | Freq: Four times a day (QID) | ORAL | Status: DC | PRN

## 2021-11-16 MED ORDER — HYDRALAZINE HCL 50 MG PO TABS
50.0000 mg | ORAL_TABLET | Freq: Two times a day (BID) | ORAL | 0 refills | Status: DC
  Filled 2021-11-16: qty 60, 30d supply, fill #0

## 2021-11-16 MED ORDER — MAGNESIUM SULFATE 2 GM/50ML IV SOLN
2.0000 g | Freq: Once | INTRAVENOUS | Status: AC
  Administered 2021-11-16: 2 g via INTRAVENOUS
  Filled 2021-11-16: qty 50

## 2021-11-16 MED ORDER — CARVEDILOL 25 MG PO TABS
25.0000 mg | ORAL_TABLET | Freq: Two times a day (BID) | ORAL | 0 refills | Status: DC
  Filled 2021-11-16: qty 60, 30d supply, fill #0

## 2021-11-16 MED ORDER — CALCIUM ACETATE (PHOS BINDER) 667 MG PO CAPS
667.0000 mg | ORAL_CAPSULE | Freq: Three times a day (TID) | ORAL | 0 refills | Status: AC
  Filled 2021-11-16: qty 30, 10d supply, fill #0

## 2021-11-16 NOTE — Inpatient Diabetes Management (Signed)
Inpatient Diabetes Program Recommendations  AACE/ADA: New Consensus Statement on Inpatient Glycemic Control   Target Ranges:  Prepandial:   less than 140 mg/dL      Peak postprandial:   less than 180 mg/dL (1-2 hours)      Critically ill patients:  140 - 180 mg/dL    Latest Reference Range & Units 11/15/21 08:36 11/15/21 12:11 11/15/21 16:36 11/15/21 21:11 11/16/21 08:23 11/16/21 12:17  Glucose-Capillary 70 - 99 mg/dL 152 (H) 188 (H) 328 (H) 245 (H) 225 (H) 271 (H)   Review of Glycemic Control  Diabetes history: DM2 Outpatient Diabetes medications: Glipizide 10 mg BID Current orders for Inpatient glycemic control: Novolog 0-6 units TID with meals   Inpatient Diabetes Program Recommendations:     Insulin: Please consider ordering Novolog 0-5 units QHS for bedtime correction and ordering Novolog 2 units TID with meals for meal coverage if patient eats at least 50% of meals.    Diet: Please consider discontinuing Regular diet and ordering Carb Modified diet.   Thanks, Barnie Alderman, RN, MSN, [place] Diabetes Coordinator Inpatient Diabetes Program [phone] (Team Pager from 8am to [place])

## 2021-11-16 NOTE — Discharge Summary (Signed)
Physician Discharge Summary   [name] MRN: [id number] DOB: Jul 27, 1970  Admit date:     11/13/2021  Discharge date: 11/16/21  Discharge Physician: Jimmy Picket Elaijah Munoz   PCP: System, Provider Not In   Recommendations at discharge:    Plan to continue furosemide 40 mg po bid Follow up with Nephrology Dr Theador Hawthorne next week, appointment made for Thursday September 28 at 2:20 pm Increased dose of carvedilol and resumed bid hydralazine.   Discharge Diagnoses: Principal Problem:   Acute on chronic diastolic CHF (congestive heart failure) (HCC) Active Problems:   Acute renal failure superimposed on stage 3b chronic kidney disease (HCC)   Sepsis secondary to UTI [facility])   Essential hypertension, benign   Hypothyroidism, adult   Type 2 diabetes mellitus with hyperlipidemia ([place])   Class 3 obesity ([place])  Resolved Problems:   * No resolved hospital problems. [facility] Course: [name] was admitted to the hospital with the working diagnosis of decompensated heart failure, complicated with worsening renal function.   51 year old female with extensive history of HTN, HLD, dCHF, DM 2, CKD 3b, presenting today ED with chief complaint of shortness of breath, and fatigue.  Patient reports that her symptoms started over a week ago and progressively has been getting worse.  She has difficulty sleeping, requiring multiple pillows now unable to lie flat.  She woke up on the day of admission with significant shortness of breath and increased work of breathing.  But denies any cough or fever/chills.  Denies of your recent illnesses.  Also over the past week noticed worsening lower extremity swelling.  On her initial physical examination her blood pressure (!) 170/78, pulse 75, temperature 97.6 F (36.4 C), temperature source Oral, resp. rate (!) 25, weight 90.7 kg, on 4 L of oxygen SpO2 100 %.  Na 136, K 3,7 Cl 107, bicarbonate at 19, glucose 268, bun 40 cr 5,15  BNP 546 High  sensitive troponin 11  Wbc 10.5 hgb 8.8 plt 334  Urine analysis with SG 1,010, > 300 protein, >50 wbc, 11-20 rbc, > 500 glucose  Protein creatinine ration 3,67   Chest radiograph with cardiomegaly, bilateral hilar vascular congestion with bilateral interstitial infiltrates.  EKG with 95 bpm, right axis deviation, normal intervals, sinus rhythm with no significant ST segment or T wave changes.   Patient was placed on high doses of furosemide for diuresis Antibiotic therapy for urine infection.   Her volume status has improved, but serum cr continue to be elevated.  Patient will need a very close follow up as outpatient, plan to continue diuresis with furosemide.      Assessment and Plan: * Acute on chronic diastolic CHF (congestive heart failure) (HCC) Echocardiogram with preserved LV systolic function with EF 60 to 65%, moderate dilatation of LA, small pericardial effusion.   Total fluid balance since admission is negative 2,230 ml.   Plan to continue diuresis with furosemide po at increase dose of 40 mg bid.  Limited pharmacologic options due to reduced GFR.  Continue with carvedilol and blood pressure control with amlodipine plus hydralazine.   Acute hypoxemic respiratory failure due to cardiogenic pulmonary edema.  At the time of her discharge her 02 saturation is 95% on room air.   Acute respiratory failure with hypoxia (HCC) - Likely due to volume overload, cardiorenal syndrome -Continue aggressive diuresing -In ED required up to 4 L of oxygen by nasal cannula currently on 2 L satting 100% -Continue supplemental oxygen, -Monitoring closely -Lasix and  nitro drip glycerin drip was started in ED  Acute renal failure superimposed on stage 3b chronic kidney disease (HCC) Hyponatremia, hypomagnesemia and hypokalemia  Renal US with no mass or hydronephrosis, no renal atrophy. Urine protein creatinine ration is 3,67   Her volume has improved, no dyspnea but continue to have  lower extremity edema.  Weight is up 3 kg.   Renal function with serum cr up to 6,12 with K at 4,1 and serum bicarbonate at 21. BUN 47 Mg is 1,7   On furosemide 40 mg po bid.  Continue supportive medical therapy for renal failure. No acute indications for renal replacement therapy.  Add 2 g mag sulfate before her discharge.  Hyper P continue with phosphate binders Continue with oral bicarbonate.   Anemia of chronic renal disease, continue close follow up on hgb .  Continue with EPO and iron supplementation.   Sepsis secondary to UTI ([place]) Antibiotic therapy with augmentin Now completed antibiotic therapy.   Essential hypertension, benign Continue blood pressure control with amlodipine, carvedilol and hydralazine   Hypothyroidism, adult - Continue home dose Synthroid  Type 2 diabetes mellitus with hyperlipidemia ([place]) Patient was placed on insulin sliding scale for glucose cover and monitoring.  Fasting glucose this am was 181.   Continue with statin therapy.   Class 3 obesity (HCC) Calculated BMI is 40,2  Mixed hyperlipidemia - Continue statins- rosuvastatin         Consultants: nephrology  Procedures performed: none   Disposition: Home Diet recommendation:  Cardiac and Carb modified diet DISCHARGE MEDICATION: Allergies as of 11/16/2021       Reactions   Ranitidine Rash   Ranitidine Hcl Rash        Medication List     STOP taking these medications    glipiZIDE 10 MG tablet Commonly known as: GLUCOTROL   losartan 100 MG tablet Commonly known as: COZAAR   methocarbamol 500 MG tablet Commonly known as: ROBAXIN   naproxen 500 MG tablet Commonly known as: Naprosyn       TAKE these medications    acetaminophen 325 MG tablet Commonly known as: TYLENOL Take 2 tablets (650 mg total) by mouth every 6 (six) hours as needed for mild pain or moderate pain (or Fever >/= 101).   amLODipine 10 MG tablet Commonly known as: NORVASC TAKE 1 TABLET BY  MOUTH DAILY What changed:  how much to take how to take this when to take this additional instructions   calcium acetate 667 MG capsule Commonly known as: PHOSLO Take 1 capsule (667 mg total) by mouth 3 (three) times daily with meals for 30 doses.   carvedilol 25 MG tablet Commonly known as: COREG Take 1 tablet (25 mg total) by mouth 2 (two) times daily. What changed:  medication strength how much to take   furosemide 40 MG tablet Commonly known as: LASIX Take 1 tablet (40 mg total) by mouth 2 (two) times daily. What changed:  medication strength how much to take how to take this when to take this additional instructions   hydrALAZINE 50 MG tablet Commonly known as: APRESOLINE Take 1 tablet (50 mg total) by mouth 2 (two) times daily.   levothyroxine 50 MCG tablet Commonly known as: SYNTHROID TAKE 1 TABLET(50 MCG) BY MOUTH DAILY What changed:  how much to take how to take this when to take this additional instructions   rosuvastatin 40 MG tablet Commonly known as: CRESTOR Take 1 tablet (40 mg total) by mouth daily with supper.  sodium bicarbonate 650 MG tablet Take 650 mg by mouth 4 (four) times daily.        Follow-up Information     Liana Gerold, MD Follow up on 11/24/2021.   Specialty: Nephrology Why: 2:20 pm Contact information: [address] [place] [address] [phone]                Discharge Exam: Filed Weights   11/13/21 0916 11/15/21 0602 11/16/21 9024  Weight: 90.7 kg 93.7 kg 93.5 kg   BP (!) 160/62   Pulse 89   Temp 98.3 F (36.8 C) (Oral)   Resp 20   Ht 5' (1.524 m)   Wt 93.5 kg   LMP 09/22/2019 (Approximate)   SpO2 94%   BMI 40.27 kg/m   Patient with no dyspnea and no chest pain  Neurology awake and alert ENT with mild pallor Cardiovascular with S1 and S2 present and rhythmic with no gallops or rubs Respiratory with mild rales at bases with no wheezing Abdomen not distended Positive lower  extremity edema ++    Condition at discharge: stable  The results of significant diagnostics from this hospitalization (including imaging, microbiology, ancillary and laboratory) are listed below for reference.   Imaging Studies: ECHOCARDIOGRAM COMPLETE  Result Date: 11/14/2021    ECHOCARDIOGRAM REPORT   Patient Name:   RAMISA DUMAN Date of Exam: 11/14/2021 Medical Rec #:  [id number]      Height:       60.0 in Accession #:    [id number]     Weight:       200.0 lb Date of Birth:  26-Jul-1970      BSA:          1.867 m Patient Age:    64 years       BP:           159/75 mmHg Patient Gender: F              HR:           84 bpm. Exam Location:  Inpatient Procedure: 2D Echo, Cardiac Doppler, Color Doppler and Intracardiac            Opacification Agent Indications:    CHF/Pericardial Effusion  History:        Patient has prior history of Echocardiogram examinations, most                 recent 09/16/2020. Risk Factors:Hypertension, Dyslipidemia and                 Diabetes. CKD 3b.  Sonographer:    Eartha Inch Referring Phys: [phone] SEYED A SHAHMEHDI  Sonographer Comments: Technically difficult study due to poor echo windows. Image acquisition challenging due to patient body habitus and Image acquisition challenging due to respiratory motion. Small pericardial effusion noted. IMPRESSIONS  1. Left ventricular ejection fraction, by estimation, is 60 to 65%. The left ventricle has normal function. The left ventricle has no regional wall motion abnormalities. Left ventricular diastolic parameters are consistent with Grade II diastolic dysfunction (pseudonormalization). Elevated left ventricular end-diastolic pressure.  2. Right ventricular systolic function was not well visualized. The right ventricular size is not well visualized. Tricuspid regurgitation signal is inadequate for assessing PA pressure.  3. Left atrial size was moderately dilated.  4. A small pericardial effusion is present. The pericardial  effusion is circumferential. There is no evidence of cardiac tamponade.  5. The mitral valve is normal in structure. No evidence of  mitral valve regurgitation. No evidence of mitral stenosis.  6. The aortic valve is tricuspid. Aortic valve regurgitation is not visualized. Aortic valve sclerosis/calcification is present, without any evidence of aortic stenosis. FINDINGS  Left Ventricle: Left ventricular ejection fraction, by estimation, is 60 to 65%. The left ventricle has normal function. The left ventricle has no regional wall motion abnormalities. Definity contrast agent was given IV to delineate the left ventricular  endocardial borders. The left ventricular internal cavity size was normal in size. There is no left ventricular hypertrophy. Left ventricular diastolic parameters are consistent with Grade II diastolic dysfunction (pseudonormalization). Elevated left ventricular end-diastolic pressure. Right Ventricle: The right ventricular size is not well visualized. Right vetricular wall thickness was not assessed. Right ventricular systolic function was not well visualized. Tricuspid regurgitation signal is inadequate for assessing PA pressure. Left Atrium: Left atrial size was moderately dilated. Right Atrium: Right atrial size was normal in size. Pericardium: A small pericardial effusion is present. The pericardial effusion is circumferential. There is no evidence of cardiac tamponade. Mitral Valve: The mitral valve is normal in structure. Mild mitral annular calcification. No evidence of mitral valve regurgitation. No evidence of mitral valve stenosis. MV peak gradient, 8.2 mmHg. The mean mitral valve gradient is 4.0 mmHg. Tricuspid Valve: The tricuspid valve is normal in structure. Tricuspid valve regurgitation is not demonstrated. No evidence of tricuspid stenosis. Aortic Valve: The aortic valve is tricuspid. Aortic valve regurgitation is not visualized. Aortic valve sclerosis/calcification is present,  without any evidence of aortic stenosis. Pulmonic Valve: The pulmonic valve was normal in structure. Pulmonic valve regurgitation is mild. No evidence of pulmonic stenosis. Aorta: The aortic root is normal in size and structure. Venous: The inferior vena cava was not well visualized. IAS/Shunts: No atrial level shunt detected by color flow Doppler.  LEFT VENTRICLE PLAX 2D LVIDd:         4.20 cm   Diastology LVIDs:         2.80 cm   LV e' medial:    6.57 cm/s LV PW:         1.10 cm   LV E/e' medial:  17.8 LV IVS:        1.00 cm   LV e' lateral:   8.55 cm/s LVOT diam:     1.55 cm   LV E/e' lateral: 13.7 LV SV:         42 LV SV Index:   23 LVOT Area:     1.89 cm  RIGHT VENTRICLE RV S prime:     10.40 cm/s LEFT ATRIUM           Index LA diam:      4.30 cm 2.30 cm/m LA Vol (A2C): 49.9 ml 26.73 ml/m LA Vol (A4C): 85.6 ml 45.86 ml/m  AORTIC VALVE LVOT Vmax:   112.00 cm/s LVOT Vmean:  88.900 cm/s LVOT VTI:    0.224 m  AORTA Ao Root diam: 2.30 cm Ao Asc diam:  3.00 cm MITRAL VALVE MV Area (PHT): 3.91 cm     SHUNTS MV Area VTI:   1.19 cm     Systemic VTI:  0.22 m MV Peak grad:  8.2 mmHg     Systemic Diam: 1.55 cm MV Mean grad:  4.0 mmHg MV Vmax:       1.43 m/s MV Vmean:      93.9 cm/s MV Decel Time: 194 msec MV E velocity: 117.00 cm/s MV A velocity: 122.00 cm/s MV E/A ratio:  0.96 Traci Turner  MD Electronically signed by Fransico Him MD Signature Date/Time: 11/14/2021/11:32:09 AM    Final    US RENAL  Result Date: 11/13/2021 CLINICAL DATA:  Acute renal failure EXAM: RENAL / URINARY TRACT ULTRASOUND COMPLETE COMPARISON:  06/14/2020 FINDINGS: Right Kidney: Renal measurements: 11.0 x 5.7 x 5.7 cm = volume: 187.8 ML. Echogenicity within normal limits. No mass or hydronephrosis visualized. Left Kidney: Renal measurements: 10.8 x 6.6 x 5.9 cm = volume: 217.7 mL. Echogenicity within normal limits. No mass or hydronephrosis visualized. Bladder: Appears normal for degree of bladder distention. Other: None. IMPRESSION: 1.  Unremarkable renal ultrasound. Electronically Signed   By: Randa Ngo M.D.   On: 11/13/2021 22:49   DG Chest 2 View  Result Date: 11/13/2021 CLINICAL DATA:  51 year old female with shortness of breath and cough. EXAM: CHEST - 2 VIEW COMPARISON:  Chest radiograph 09/02/2020 and earlier. FINDINGS: Upright AP and lateral views of the chest at 0921 hours. Chronic cardiomegaly. Mildly lower lung volumes. There is trace pleural fluid in the fissures on the lateral view. Increased pulmonary vascularity compared to prior exams. No layering pleural effusion, pneumothorax, or consolidation. Visualized tracheal air column is within normal limits. No acute osseous abnormality identified. Stable cholecystectomy clips. Paucity of bowel gas in the visible abdomen. IMPRESSION: Chronic cardiomegaly with acute pulmonary interstitial edema. Electronically Signed   By: Genevie Ann M.D.   On: 11/13/2021 09:31    Microbiology: Results for orders placed or performed during the hospital encounter of 11/13/21  Urine Culture     Status: Abnormal   Collection Time: 11/13/21 10:07 AM   Specimen: Urine, Clean Catch  Result Value Ref Range Status   Specimen Description   Final    URINE, CLEAN CATCH Performed at [facility], [address], [place], [place] [address]    Special Requests   Final    NONE Performed at [facility], [address], [place], [place] [address]    Culture (A)  Final    70,000 COLONIES/mL GROUP B STREP(S.AGALACTIAE)ISOLATED TESTING AGAINST S. AGALACTIAE NOT ROUTINELY PERFORMED DUE TO PREDICTABILITY OF AMP/PEN/VAN SUSCEPTIBILITY. Performed at [facility], [place] [address] [place], [place] [address]    Report Status 11/14/2021 FINAL  Final  Resp Panel by RT-PCR (Flu A&B, Covid) Urine, Clean Catch     Status: None   Collection Time: 11/13/21 10:10 AM   Specimen: Urine, Clean Catch; Nasal Swab  Result Value Ref Range Status   SARS Coronavirus 2 by RT PCR NEGATIVE NEGATIVE Final    Comment:  (NOTE) SARS-CoV-2 target nucleic acids are NOT DETECTED.  The SARS-CoV-2 RNA is generally detectable in upper respiratory specimens during the acute phase of infection. The lowest concentration of SARS-CoV-2 viral copies this assay can detect is 138 copies/mL. A negative result does not preclude SARS-Cov-2 infection and should not be used as the sole basis for treatment or other patient management decisions. A negative result may occur with  improper specimen collection/handling, submission of specimen other than nasopharyngeal swab, presence of viral mutation(s) within the areas targeted by this assay, and inadequate number of viral copies(<138 copies/mL). A negative result must be combined with clinical observations, patient history, and epidemiological information. The expected result is Negative.  Fact Sheet for Patients:  EntrepreneurPulse.com.au  Fact Sheet for Healthcare Providers:  IncredibleEmployment.be  This test is no t yet approved or cleared by the [place] FDA and  has been authorized for detection and/or diagnosis of SARS-CoV-2 by FDA under an Emergency Use Authorization (  EUA). This EUA will remain  in effect (meaning this test can be used) for the duration of the COVID-19 declaration under Section 564(b)(1) of the Act, 21 U.S.C.section 360bbb-3(b)(1), unless the authorization is terminated  or revoked sooner.       Influenza A by PCR NEGATIVE NEGATIVE Final   Influenza B by PCR NEGATIVE NEGATIVE Final    Comment: (NOTE) The Xpert Xpress SARS-CoV-2/FLU/RSV plus assay is intended as an aid in the diagnosis of influenza from Nasopharyngeal swab specimens and should not be used as a sole basis for treatment. Nasal washings and aspirates are unacceptable for Xpert Xpress SARS-CoV-2/FLU/RSV testing.  Fact Sheet for Patients: EntrepreneurPulse.com.au  Fact Sheet for Healthcare  Providers: IncredibleEmployment.be  This test is not yet approved or cleared by the [place] FDA and has been authorized for detection and/or diagnosis of SARS-CoV-2 by FDA under an Emergency Use Authorization (EUA). This EUA will remain in effect (meaning this test can be used) for the duration of the COVID-19 declaration under Section 564(b)(1) of the Act, 21 U.S.C. section 360bbb-3(b)(1), unless the authorization is terminated or revoked.  Performed at [facility], [address], [place], [place] [address]     Labs: CBC: Recent Labs  Lab 11/13/21 0901 11/14/21 0425 11/15/21 0548  WBC 10.5 10.2 9.5  HGB 8.8* 7.9* 7.6*  HCT 26.9* 24.6* 22.6*  MCV 86.5 86.3 84.6  PLT 334 306 828   Basic Metabolic Panel: Recent Labs  Lab 11/13/21 0901 11/13/21 1118 11/14/21 0425 11/15/21 0548 11/16/21 0422  NA 136  --  138 134* 138  K 3.7  --  3.7 3.4* 4.1  CL 107  --  108 103 103  CO2 19*  --  21* 22 21*  GLUCOSE 268*  --  230* 181* 195*  BUN 40*  --  42* 45* 47*  CREATININE 5.15*  --  5.41* 5.55* 6.12*  CALCIUM 8.5*  --  8.6* 8.3* 8.9  MG  --  2.0  --   --  1.7  PHOS  --  4.6 4.9*  --   --    Liver Function Tests: No results for input(s): "AST", "ALT", "ALKPHOS", "BILITOT", "PROT", "ALBUMIN" in the last 168 hours. CBG: Recent Labs  Lab 11/15/21 1211 11/15/21 1636 11/15/21 2111 11/16/21 0823 11/16/21 1217  GLUCAP 188* 328* 245* 225* 271*    Discharge time spent: greater than 30 minutes.  Signed: Tawni Millers, MD [facility] 11/16/2021

## 2021-11-16 NOTE — Progress Notes (Addendum)
Progress Note   [name] EPP:[id number] DOB: 03/16/1970 DOA: 11/13/2021     3 DOS: the patient was seen and examined on 11/16/2021   Brief hospital course: Mrs. Mctigue was admitted to the hospital with the working diagnosis of decompensated heart failure, complicated with worsening renal function.   51 year old female with extensive history of HTN, HLD, dCHF, DM 2, CKD 3b, presenting today ED with chief complaint of shortness of breath, and fatigue.  Patient reports that her symptoms started over a week ago and progressively has been getting worse.  She has difficulty sleeping, requiring multiple pillows now unable to lie flat.  She woke up on the day of admission with significant shortness of breath and increased work of breathing.  But denies any cough or fever/chills.  Denies of your recent illnesses.  Also over the past week noticed worsening lower extremity swelling.  On her initial physical examination her blood pressure (!) 170/78, pulse 75, temperature 97.6 F (36.4 C), temperature source Oral, resp. rate (!) 25, weight 90.7 kg, on 4 L of oxygen SpO2 100 %.  Na 136, K 3,7 Cl 107, bicarbonate at 19, glucose 268, bun 40 cr 5,15  BNP 546 High sensitive troponin 11  Wbc 10.5 hgb 8.8 plt 334  Urine analysis with SG 1,010, > 300 protein, >50 wbc, 11-20 rbc, > 500 glucose  Protein creatinine ration 3,67   Chest radiograph with cardiomegaly, bilateral hilar vascular congestion with bilateral interstitial infiltrates.  EKG with 95 bpm, right axis deviation, normal intervals, sinus rhythm with no significant ST segment or T wave changes.   Patient was placed on high doses of furosemide for diuresis Antibiotic therapy for urine infection.   Her volume status has improved, but renal function is not stable yet.      Assessment and Plan: * Acute on chronic diastolic CHF (congestive heart failure) (HCC) Echocardiogram with preserved LV systolic function with EF 60 to 65%,  moderate dilatation of LA, small pericardial effusion.   Urine output documented is 600 cc Total fluid balance since admission is negative 2,230 ml.   Plan to continue diuresis with furosemide Limited pharmacologic options due to reduced GFR.  Continue with carvedilol and blood pressure control with amlodipine plus hydralazine.   Acute hypoxemic respiratory failure due to cardiogenic pulmonary edema.   Acute respiratory failure with hypoxia (HCC) - Likely due to volume overload, cardiorenal syndrome -Continue aggressive diuresing -In ED required up to 4 L of oxygen by nasal cannula currently on 2 L satting 100% -Continue supplemental oxygen, -Monitoring closely -Lasix and nitro drip glycerin drip was started in ED  Acute renal failure superimposed on stage 3b chronic kidney disease (HCC) Hyponatremia, hypomagnesemia and hypokalemia  Renal US with no mass or hydronephrosis, no renal atrophy. Urine protein creatinine ration is 3,67   Her volume has improved, no dyspnea but continue to have lower extremity edema.  Weight is up 3 kg.   Renal function with serum cr up to 6,12 with K at 4,1 and serum bicarbonate at 21. BUN 47 Mg is 1,7   On furosemide 40 mg po bid, to consider to change to IV.   Continue supportive medical therapy for renal failure. No acute indications for renal replacement therapy.  Add 2 g mag sulfate Hyper P continue with phosphate binders Continue with oral bicarbonate.   Anemia of chronic renal disease, continue close follow up on hgb .  Continue with EPO and iron supplementation.   Sepsis secondary to UTI (  [place]) Antibiotic therapy with augmentin Will stop antibiotic after 3 days.   Essential hypertension, benign Continue blood pressure control with amlodipine, carvedilol and hydralazine   Hypothyroidism, adult - Continue home dose Synthroid  Type 2 diabetes mellitus with hyperlipidemia ([place]) Continue glucose cover and monitoring with insulin sliding  scale. Fasting glucose this am was 181.   Continue with statin therapy.   Class 3 obesity (HCC) Calculated BMI is 40,2  Mixed hyperlipidemia - Continue statins- rosuvastatin        Subjective: Patient with no dyspnea, no chest pain, continue to have lower extremity edema   Physical Exam: Vitals:   11/15/21 0900 11/15/21 2111 11/16/21 0623 11/16/21 0904  BP: (!) 152/23 (!) 136/59 (!) 162/65 (!) 160/62  Pulse: 92 81 85 89  Resp:      Temp:  98.2 F (36.8 C) 98.3 F (36.8 C)   TempSrc:  Oral Oral   SpO2:  94%    Weight:   93.5 kg   Height:       Neurology awake and alert ENT with mild pallor Cardiovascular with S1 and S2 present and rhythmic with no gallops or rubs Respiratory with mild rales at bases with no wheezing Abdomen not distended Positive lower extremity edema ++  Data Reviewed:    Family Communication: I spoke with patient's daughter at the bedside, we talked in detail about patient's condition, plan of care and prognosis and all questions were addressed.   Disposition: Status is: Inpatient Remains inpatient appropriate because: renal failure   Planned Discharge Destination: Home    Author: Tawni Millers, MD 11/16/2021 9:57 AM  For on call review [email or web address].

## 2021-11-16 NOTE — Assessment & Plan Note (Signed)
Calculated BMI is 40,2

## 2021-11-16 NOTE — Progress Notes (Signed)
Subjective:  not quite as much urine recorded last 24 hours-  weight is stable.  Kidney numbers slightly worse but feels much better -  back to baseline   Objective Vital signs in last 24 hours: Vitals:   11/15/21 0900 11/15/21 2111 11/16/21 0623 11/16/21 0904  BP: (!) 152/23 (!) 136/59 (!) 162/65 (!) 160/62  Pulse: 92 81 85 89  Resp:      Temp:  98.2 F (36.8 C) 98.3 F (36.8 C)   TempSrc:  Oral Oral   SpO2:  94%    Weight:   93.5 kg   Height:       Weight change: -0.181 kg  Intake/Output Summary (Last 24 hours) at 11/16/2021 1113 Last data filed at 11/16/2021 5102 Gross per 24 hour  Intake --  Output 600 ml  Net -600 ml    Assessment/ Plan: Pt is a 51 y.o. yo female with DM, HTN, stage 4 CKD who was admitted on 11/13/2021 with  volume overload and worsening  creatinine  Assessment/Plan: 1. Renal-  known CKD-  crt 2-2.2 in July/August 2022 and 3.75 in April of 23 followed by Dr. Theador Hawthorne.  Now presents with volume overload in the setting of running out of lasix-  crt 5.1.  Unfotunately this just appears to be fairly rapid progression of CKD in the setting of DM.  Serologies have been checked in the past and were negative.  Interestingly the renal ultrasound shows fairly normal appearing kidneys-  urine showing proteinuria - quant at 3.7 grams and hematuria.  Have held the ARB in the setting of A on CRF.  Crt slightly worse than yesterday but indication for dialysis and in actuality GFR not much different than April.  I have impressed upon pt and her daughter to have quick follow up with Theador Hawthorne to make plans for the future-  see if kidney function settles out enough to be restarted on ARB 2. HTN/vol-  overloaded-  has responded to iv lasix 120 q 8-  pt very much symptomatically improved -  also on norvasc/coreg and hydralazine  -  she continues to say she is much better even though objective info does not reveal too much diuresis-  onlly on lasix 40 daily at home-  now on PO lasix 40 BID   -  if cont to be clinically improved and numbers dont change too much could maybe go home tomorrow  3. Anemia-  hgb in the 7's- iron low-  giving iron -  got ferahema 9/17-  will give another dose today-  also dosed ESA 4. Metabolic acidosis- on bicarb -   continue   Is much better symptomatically -  volume status better-  GFR low but not much different than previous not uremic.  I would be comfortable with discharge to follow close with her OP nephrologist Dr. Aurelio Brash   Labs: Basic Metabolic Panel: Recent Labs  Lab 11/13/21 1118 11/14/21 0425 11/15/21 0548 11/16/21 0422  NA  --  138 134* 138  K  --  3.7 3.4* 4.1  CL  --  108 103 103  CO2  --  21* 22 21*  GLUCOSE  --  230* 181* 195*  BUN  --  42* 45* 47*  CREATININE  --  5.41* 5.55* 6.12*  CALCIUM  --  8.6* 8.3* 8.9  PHOS 4.6 4.9*  --   --    Liver Function Tests: No results for input(s): "AST", "ALT", "ALKPHOS", "BILITOT", "PROT", "ALBUMIN"  in the last 168 hours. No results for input(s): "LIPASE", "AMYLASE" in the last 168 hours. No results for input(s): "AMMONIA" in the last 168 hours. CBC: Recent Labs  Lab 11/13/21 0901 11/14/21 0425 11/15/21 0548  WBC 10.5 10.2 9.5  HGB 8.8* 7.9* 7.6*  HCT 26.9* 24.6* 22.6*  MCV 86.5 86.3 84.6  PLT 334 306 292   Cardiac Enzymes: No results for input(s): "CKTOTAL", "CKMB", "CKMBINDEX", "TROPONINI" in the last 168 hours. CBG: Recent Labs  Lab 11/15/21 0836 11/15/21 1211 11/15/21 1636 11/15/21 2111 11/16/21 0823  GLUCAP 152* 188* 328* 245* 225*    Iron Studies:  No results for input(s): "IRON", "TIBC", "TRANSFERRIN", "FERRITIN" in the last 72 hours.  Studies/Results: No results found. Medications: Infusions:  ferumoxytol (FERAHEME) 510 mg in sodium chloride 0.9 % 100 mL IVPB     magnesium sulfate bolus IVPB      Scheduled Medications:  amLODipine  10 mg Oral Daily   calcium acetate  667 mg Oral TID WC   carvedilol  25 mg Oral BID    darbepoetin (ARANESP) injection - NON-DIALYSIS  200 mcg Subcutaneous Q Tue-1800   furosemide  40 mg Oral BID   heparin  5,000 Units Subcutaneous Q8H   hydrALAZINE  50 mg Oral BID   insulin aspart  0-6 Units Subcutaneous TID WC   levothyroxine  50 mcg Oral Daily   rosuvastatin  40 mg Oral Q supper   sodium bicarbonate  650 mg Oral BID   sodium chloride flush  3 mL Intravenous Q12H    have reviewed scheduled and prn medications.  Physical Exam: General: obese , alert-  coughing  Heart: RRR Lungs: mostly clear Abdomen: obese, soft, non tender Extremities: 1+  peripheral edema -  improved from admit     11/16/2021,11:13 AM  LOS: 3 days

## 2021-11-16 NOTE — Progress Notes (Signed)
Physical Therapy Treatment Patient Details Name: Ana [name] MRN: [id number] DOB: 10-Oct-1970 Today's Date: 11/16/2021   History of Present Illness 51 yo presenting to the ED with SOB, fatigue, and cough on 11/13/21. Admitted same day with CHF exacerbation. PMH includes: HTN, HLD, dCHF, DM 2, CKD 3b    PT Comments    Pt admitted with above diagnosis. Pt was able to ambulate with guidance  and min guard assist due to poor vision.  Pt progressed distance. Did c/o bil hip pain therefore got some hot packs to heat the area for comfort. Pt appreciative. Pt currently with functional limitations due to balance and endurance deficits. Pt will benefit from skilled PT to increase their independence and safety with mobility to allow discharge to the venue listed below.      Recommendations for follow up therapy are one component of a multi-disciplinary discharge planning process, led by the attending physician.  Recommendations may be updated based on patient status, additional functional criteria and insurance authorization.  Follow Up Recommendations  No PT follow up     Assistance Recommended at Discharge Set up Supervision/Assistance  Patient can return home with the following Assistance with cooking/housework;Assist for transportation;Help with stairs or ramp for entrance   Equipment Recommendations  None recommended by PT    Recommendations for Other Services       Precautions / Restrictions Precautions Precautions: Fall;Other (comment) (legally blind) Restrictions Weight Bearing Restrictions: No     Mobility  Bed Mobility Overal bed mobility: Needs Assistance             General bed mobility comments: sitting EOB on arrrival.    Transfers Overall transfer level: Needs assistance Equipment used: 1 person hand held assist Transfers: Sit to/from Stand Sit to Stand: Min guard           General transfer comment: to set up safely with all lines in her situation     Ambulation/Gait Ambulation/Gait assistance: Min guard Gait Distance (Feet): 100 Feet Assistive device: None, 1 person hand held assist Gait Pattern/deviations: Step-through pattern, Wide base of support Gait velocity: reduced Gait velocity interpretation: <1.31 ft/sec, indicative of household ambulator   General Gait Details: manuevering with min guard cues and guidance at hand for environment with light being low   Marine scientist [place] (Stroke Patients Only)       Balance Overall balance assessment: Needs assistance Sitting-balance support: Feet supported Sitting balance-Leahy Scale: Good     Standing balance support: Single extremity supported, During functional activity Standing balance-Leahy Scale: Fair Standing balance comment: can stand statically without UE support                            Cognition Arousal/Alertness: Awake/alert Behavior During Therapy: WFL for tasks assessed/performed Overall Cognitive Status: Within Functional Limits for tasks assessed                                          Exercises General Exercises - Lower Extremity Ankle Circles/Pumps: AROM, Both, 10 reps, Seated Long Arc Quad: AROM, Both, 10 reps, Seated    General Comments General comments (skin integrity, edema, etc.): 88 bpm, O2 >90% on RA      Pertinent Vitals/Pain Pain Assessment Pain Assessment: Faces Faces  Pain Scale: Hurts even more Pain Location: bil hips Pain Descriptors / Indicators: Aching, Discomfort, Grimacing, Guarding Pain Intervention(s): Limited activity within patient's tolerance, Monitored during session, Repositioned, Heat applied    Home Living                          Prior Function            PT Goals (current goals can now be found in the care plan section) Acute Rehab PT Goals Patient Stated Goal: to get better and go home Progress towards PT goals:  Progressing toward goals    Frequency    Min 3X/week      PT Plan Current plan remains appropriate    Co-evaluation              AM-PAC PT "6 Clicks" Mobility   Outcome Measure  Help needed turning from your back to your side while in a flat bed without using bedrails?: None Help needed moving from lying on your back to sitting on the side of a flat bed without using bedrails?: A Little Help needed moving to and from a bed to a chair (including a wheelchair)?: A Little Help needed standing up from a chair using your arms (e.g., wheelchair or bedside chair)?: A Little Help needed to walk in hospital room?: A Little Help needed climbing 3-5 steps with a railing? : A Little 6 Click Score: 19    End of Session Equipment Utilized During Treatment: Gait belt Activity Tolerance: Patient limited by fatigue;Other (comment) (mild SOB without sat drop) Patient left: in bed;with call bell/phone within reach (sitting EOB) Nurse Communication: Mobility status PT Visit Diagnosis: Difficulty in walking, not elsewhere classified (R26.2)     Time: 1761-6073 PT Time Calculation (min) (ACUTE ONLY): 12 min  Charges:  $Gait Training: 8-22 mins                     Lemya Greenwell M,PT Acute Rehab Services [place] 11/16/2021, 2:01 PM

## 2021-11-17 ENCOUNTER — Telehealth: Payer: Self-pay

## 2021-11-17 NOTE — Telephone Encounter (Signed)
Transition Care Management Follow-up Telephone Call Date of discharge and from where: 11/16/2021 from [facility] How have you been since you were released from the hospital? Patient stated that she is feeling better. Patient stated that she does have an appt with Nephrology next week. Patient mentioned that she has a blister on her belly and it is a little sore. I encouraged to reach out to PCP about how to care for blister and if an appt is needed.  Any questions or concerns? No  Items Reviewed: Did the pt receive and understand the discharge instructions provided? Yes  Medications obtained and verified? Yes  Other? No  Any new allergies since your discharge? No  Dietary orders reviewed? No Do you have support at home? Yes   Functional Questionnaire: (I = Independent and D = Dependent) ADLs: I  Bathing/Dressing- I  Meal Prep- D  Eating- I  Maintaining continence- I  Transferring/Ambulation- I  Managing Meds- I   Follow up appointments reviewed:  PCP Hospital f/u appt confirmed? No   Specialist Hospital f/u appt confirmed? Yes  Scheduled to see Dr Theador Hawthorne on 11/24/2021 @ 2:20pm. Are transportation arrangements needed? No  If their condition worsens, is the pt aware to call PCP or go to the Emergency Dept.? Yes Was the patient provided with contact information for the PCP's office or ED? Yes Was to pt encouraged to call back with questions or concerns? Yes

## 2021-11-20 DIAGNOSIS — E1165 Type 2 diabetes mellitus with hyperglycemia: Secondary | ICD-10-CM | POA: Diagnosis not present

## 2021-11-21 DIAGNOSIS — E1165 Type 2 diabetes mellitus with hyperglycemia: Secondary | ICD-10-CM | POA: Diagnosis not present

## 2021-11-22 DIAGNOSIS — E1165 Type 2 diabetes mellitus with hyperglycemia: Secondary | ICD-10-CM | POA: Diagnosis not present

## 2021-11-23 DIAGNOSIS — E1165 Type 2 diabetes mellitus with hyperglycemia: Secondary | ICD-10-CM | POA: Diagnosis not present

## 2021-11-24 DIAGNOSIS — I129 Hypertensive chronic kidney disease with stage 1 through stage 4 chronic kidney disease, or unspecified chronic kidney disease: Secondary | ICD-10-CM | POA: Diagnosis not present

## 2021-11-24 DIAGNOSIS — N189 Chronic kidney disease, unspecified: Secondary | ICD-10-CM | POA: Diagnosis not present

## 2021-11-24 DIAGNOSIS — E1165 Type 2 diabetes mellitus with hyperglycemia: Secondary | ICD-10-CM | POA: Diagnosis not present

## 2021-11-24 DIAGNOSIS — E211 Secondary hyperparathyroidism, not elsewhere classified: Secondary | ICD-10-CM | POA: Diagnosis not present

## 2021-11-24 DIAGNOSIS — E8722 Chronic metabolic acidosis: Secondary | ICD-10-CM | POA: Diagnosis not present

## 2021-11-24 DIAGNOSIS — D638 Anemia in other chronic diseases classified elsewhere: Secondary | ICD-10-CM | POA: Diagnosis not present

## 2021-11-24 DIAGNOSIS — Z8619 Personal history of other infectious and parasitic diseases: Secondary | ICD-10-CM | POA: Diagnosis not present

## 2021-11-24 DIAGNOSIS — N170 Acute kidney failure with tubular necrosis: Secondary | ICD-10-CM | POA: Diagnosis not present

## 2021-11-24 DIAGNOSIS — D472 Monoclonal gammopathy: Secondary | ICD-10-CM | POA: Diagnosis not present

## 2021-11-24 DIAGNOSIS — E1122 Type 2 diabetes mellitus with diabetic chronic kidney disease: Secondary | ICD-10-CM | POA: Diagnosis not present

## 2021-11-24 DIAGNOSIS — I5033 Acute on chronic diastolic (congestive) heart failure: Secondary | ICD-10-CM | POA: Diagnosis not present

## 2021-11-24 DIAGNOSIS — R808 Other proteinuria: Secondary | ICD-10-CM | POA: Diagnosis not present

## 2021-11-25 DIAGNOSIS — E1165 Type 2 diabetes mellitus with hyperglycemia: Secondary | ICD-10-CM | POA: Diagnosis not present

## 2021-11-26 DIAGNOSIS — E1165 Type 2 diabetes mellitus with hyperglycemia: Secondary | ICD-10-CM | POA: Diagnosis not present

## 2021-11-27 DIAGNOSIS — E1165 Type 2 diabetes mellitus with hyperglycemia: Secondary | ICD-10-CM | POA: Diagnosis not present

## 2021-11-28 DIAGNOSIS — E1165 Type 2 diabetes mellitus with hyperglycemia: Secondary | ICD-10-CM | POA: Diagnosis not present

## 2021-11-29 DIAGNOSIS — E8722 Chronic metabolic acidosis: Secondary | ICD-10-CM | POA: Diagnosis not present

## 2021-11-29 DIAGNOSIS — R808 Other proteinuria: Secondary | ICD-10-CM | POA: Diagnosis not present

## 2021-11-29 DIAGNOSIS — E1122 Type 2 diabetes mellitus with diabetic chronic kidney disease: Secondary | ICD-10-CM | POA: Diagnosis not present

## 2021-11-29 DIAGNOSIS — E1165 Type 2 diabetes mellitus with hyperglycemia: Secondary | ICD-10-CM | POA: Diagnosis not present

## 2021-11-29 DIAGNOSIS — D638 Anemia in other chronic diseases classified elsewhere: Secondary | ICD-10-CM | POA: Diagnosis not present

## 2021-11-29 DIAGNOSIS — E211 Secondary hyperparathyroidism, not elsewhere classified: Secondary | ICD-10-CM | POA: Diagnosis not present

## 2021-11-29 DIAGNOSIS — N189 Chronic kidney disease, unspecified: Secondary | ICD-10-CM | POA: Diagnosis not present

## 2021-11-29 DIAGNOSIS — Z8619 Personal history of other infectious and parasitic diseases: Secondary | ICD-10-CM | POA: Diagnosis not present

## 2021-11-29 DIAGNOSIS — I129 Hypertensive chronic kidney disease with stage 1 through stage 4 chronic kidney disease, or unspecified chronic kidney disease: Secondary | ICD-10-CM | POA: Diagnosis not present

## 2021-11-29 DIAGNOSIS — N170 Acute kidney failure with tubular necrosis: Secondary | ICD-10-CM | POA: Diagnosis not present

## 2021-11-30 DIAGNOSIS — E1165 Type 2 diabetes mellitus with hyperglycemia: Secondary | ICD-10-CM | POA: Diagnosis not present

## 2021-12-01 ENCOUNTER — Ambulatory Visit: Payer: Medicaid Other | Attending: Internal Medicine | Admitting: Internal Medicine

## 2021-12-01 ENCOUNTER — Encounter: Payer: Self-pay | Admitting: Internal Medicine

## 2021-12-01 VITALS — BP 148/76 | HR 87 | Ht 60.0 in | Wt 205.0 lb

## 2021-12-01 DIAGNOSIS — D472 Monoclonal gammopathy: Secondary | ICD-10-CM | POA: Diagnosis not present

## 2021-12-01 DIAGNOSIS — E1122 Type 2 diabetes mellitus with diabetic chronic kidney disease: Secondary | ICD-10-CM | POA: Diagnosis not present

## 2021-12-01 DIAGNOSIS — N170 Acute kidney failure with tubular necrosis: Secondary | ICD-10-CM | POA: Diagnosis not present

## 2021-12-01 DIAGNOSIS — E211 Secondary hyperparathyroidism, not elsewhere classified: Secondary | ICD-10-CM | POA: Diagnosis not present

## 2021-12-01 DIAGNOSIS — I10 Essential (primary) hypertension: Secondary | ICD-10-CM | POA: Diagnosis not present

## 2021-12-01 DIAGNOSIS — E1165 Type 2 diabetes mellitus with hyperglycemia: Secondary | ICD-10-CM | POA: Diagnosis not present

## 2021-12-01 DIAGNOSIS — E559 Vitamin D deficiency, unspecified: Secondary | ICD-10-CM | POA: Diagnosis not present

## 2021-12-01 DIAGNOSIS — N189 Chronic kidney disease, unspecified: Secondary | ICD-10-CM | POA: Diagnosis not present

## 2021-12-01 DIAGNOSIS — I5032 Chronic diastolic (congestive) heart failure: Secondary | ICD-10-CM | POA: Diagnosis not present

## 2021-12-01 DIAGNOSIS — D638 Anemia in other chronic diseases classified elsewhere: Secondary | ICD-10-CM | POA: Diagnosis not present

## 2021-12-01 DIAGNOSIS — D508 Other iron deficiency anemias: Secondary | ICD-10-CM | POA: Diagnosis not present

## 2021-12-01 DIAGNOSIS — E8722 Chronic metabolic acidosis: Secondary | ICD-10-CM | POA: Diagnosis not present

## 2021-12-01 DIAGNOSIS — I129 Hypertensive chronic kidney disease with stage 1 through stage 4 chronic kidney disease, or unspecified chronic kidney disease: Secondary | ICD-10-CM | POA: Diagnosis not present

## 2021-12-01 DIAGNOSIS — R808 Other proteinuria: Secondary | ICD-10-CM | POA: Diagnosis not present

## 2021-12-01 MED ORDER — HYDRALAZINE HCL 50 MG PO TABS: 75.0000 mg | ORAL_TABLET | Freq: Three times a day (TID) | ORAL | 3 refills | Status: DC

## 2021-12-01 MED ORDER — AMLODIPINE BESYLATE 10 MG PO TABS: ORAL_TABLET | ORAL | 3 refills | Status: DC

## 2021-12-01 NOTE — Progress Notes (Signed)
Cardiology Office Note   Date:  12/01/2021   ID:  Ana [name], DOB 1970/10/30, MRN [id number]  PCP:  System, Provider Not In  Cardiologist:   Dorris Carnes, MD   Pt returns for follow up of  HTN      History of Present Illness: Ana [name] is a 51 y.o. female  with a hx of HTN, DM, hyothyoridism, LE edema and chest tightness.  Echo in 2022 showed moderate LVH and vigorous LV function   Also small/moderate pericardial effusion  Chest tightness improved with Rx with lasix I last saw the pt in Aug 2022.   Since then she denies CP   Breathing is OK   Notes minimal leg swelling     I saw the pt in [facility] 20[facility]   The pt says she has been feeling OK   BPat home 160/  No CP   Breathing is OK    Walks rarely to occas  Current Meds  Medication Sig   acetaminophen (TYLENOL) 325 MG tablet Take 2 tablets (650 mg total) by mouth every 6 (six) hours as needed for mild pain or moderate pain (or Fever >/= 101).   amLODipine (NORVASC) 10 MG tablet TAKE 1 TABLET BY MOUTH DAILY (Patient taking differently: Take 10 mg by mouth daily.)   carvedilol (COREG) 25 MG tablet Take 1 tablet (25 mg total) by mouth 2 (two) times daily.   furosemide (LASIX) 40 MG tablet Take 80 mg by mouth 2 (two) times daily.   hydrALAZINE (APRESOLINE) 50 MG tablet Take 1 tablet (50 mg total) by mouth 2 (two) times daily.   levothyroxine (SYNTHROID) 50 MCG tablet TAKE 1 TABLET(50 MCG) BY MOUTH DAILY (Patient taking differently: Take 50 mcg by mouth daily.)   rosuvastatin (CRESTOR) 40 MG tablet Take 1 tablet (40 mg total) by mouth daily with supper.   sodium bicarbonate 650 MG tablet Take 650 mg by mouth 4 (four) times daily.     Allergies:   Ranitidine and Ranitidine hcl   Past Medical History:  Diagnosis Date   CKD (chronic kidney disease)    Diabetes mellitus    diet controlled   Hypertension     Past Surgical History:  Procedure Laterality Date   CESAREAN SECTION  x4   CHOLECYSTECTOMY     EYE SURGERY     MASS  EXCISION  10/27/2011   Procedure: EXCISION MASS;  Surgeon: Donato Heinz, MD;  Location: [facility];  Service: General;  Laterality: Right;   TUBAL LIGATION       Social History:  The patient  reports that she has never smoked. She has never used smokeless tobacco. She reports that she does not currently use alcohol. She reports that she does not use drugs.   Family History:  The patient's family history includes Diabetes in her brother, brother, and mother; Kidney disease in her father and mother; Kidney failure in her brother, father, and mother.    ROS:  Please see the history of present illness. All other systems are reviewed and  Negative to the above problem except as noted.    PHYSICAL EXAM: VS:  BP (!) 148/76   Pulse 87   Ht 5' (1.524 m)   Wt 205 lb (93 kg)   LMP 09/22/2019 (Approximate)   SpO2 94%   BMI 40.04 kg/m   GEN: Morbidly obese 51 yo  in no acute distress  HEENT: normal  Neck: JVP is normal Cardiac: RRR; no murmurs  No LE  edema  Respiratory:  clear to auscultation bilaterally GI: soft, nontender, nondistended, + BS  No hepatomegaly  MS: no deformity Moving all extremities   Skin: warm and dry, no rash Neuro:  Strength and sensation are intact Psych: euthymic mood, full affect   EKG:  EKG is not ordered today.   Echo   09/16/20  Left ventricular ejection fraction, by estimation, is 60 to 65%. The left ventricle has normal function. The left ventricle has no regional wall motion abnormalities. There is moderate left ventricular hypertrophy. Left ventricular diastolic parameters were normal. 2. Right ventricular systolic function is normal. The right ventricular size is normal. There is normal pulmonary artery systolic pressure. The estimated right ventricular systolic pressure is 67.6 mmHg. 3. Left atrial size was mildly dilated. 4. Right atrial size was mildly dilated. 5. Small to moderate pericardial effusion. The pericardial effusion is circumferential  with moderate collection posteriorly. No clear evidence of tamponade physiology. 6. The mitral valve is grossly normal. Trivial mitral valve regurgitation. 7. The aortic valve is tricuspid. There is mild calcification of the aortic valve. Aortic valve regurgitation is not visualized. 8. The inferior vena cava is normal in size with greater than 50% respiratory variability, suggesting right atrial pressure of 3 mmHg.  Lipid Panel    Component Value Date/Time   CHOL 248 (H) 06/17/2021 1430   TRIG 75 06/17/2021 1430   HDL 70 06/17/2021 1430   CHOLHDL 3.5 06/17/2021 1430   VLDL 15 06/17/2021 1430   LDLCALC 163 (H) 06/17/2021 1430   LDLCALC 154 (H) 03/18/2020 0000      Wt Readings from Last 3 Encounters:  12/01/21 205 lb (93 kg)  11/16/21 206 lb 3.2 oz (93.5 kg)  09/30/21 185 lb (83.9 kg)      ASSESSMENT AND PLAN:  1   HTN   BP remains high here and at home  WIll increase hydralazine to 75 tid    Continue other meds   Pt has appt in renal clinic today         2  LE edema   Trivial   FOllow     3  CKD   Cr 6.12   Appt in renal today        4  HL  Lipids today  5  Hx DM  Discussed diet   A1C in July 2022 was 10.5   Will recheck    Referral to [name] for comprehensive diabeit care care Follow up based on test results

## 2021-12-01 NOTE — Patient Instructions (Signed)
Medication Instructions:  Your physician has recommended you make the following change in your medication: \ -Increase Hydralazine to 75 mg tablets three times daily   Labwork: None  Testing/Procedures: None  Follow-Up: Follow up with Dr. Harrington Challenger in 8 weeks.   Any Other Special Instructions Will Be Listed Below (If Applicable).     If you need a refill on your cardiac medications before your next appointment, please call your pharmacy.

## 2021-12-02 DIAGNOSIS — E1165 Type 2 diabetes mellitus with hyperglycemia: Secondary | ICD-10-CM | POA: Diagnosis not present

## 2021-12-03 DIAGNOSIS — E1165 Type 2 diabetes mellitus with hyperglycemia: Secondary | ICD-10-CM | POA: Diagnosis not present

## 2021-12-04 DIAGNOSIS — E1165 Type 2 diabetes mellitus with hyperglycemia: Secondary | ICD-10-CM | POA: Diagnosis not present

## 2021-12-05 DIAGNOSIS — E1165 Type 2 diabetes mellitus with hyperglycemia: Secondary | ICD-10-CM | POA: Diagnosis not present

## 2021-12-06 DIAGNOSIS — E1165 Type 2 diabetes mellitus with hyperglycemia: Secondary | ICD-10-CM | POA: Diagnosis not present

## 2021-12-07 DIAGNOSIS — E1165 Type 2 diabetes mellitus with hyperglycemia: Secondary | ICD-10-CM | POA: Diagnosis not present

## 2021-12-08 DIAGNOSIS — E1165 Type 2 diabetes mellitus with hyperglycemia: Secondary | ICD-10-CM | POA: Diagnosis not present

## 2021-12-09 DIAGNOSIS — E1165 Type 2 diabetes mellitus with hyperglycemia: Secondary | ICD-10-CM | POA: Diagnosis not present

## 2021-12-10 DIAGNOSIS — E1165 Type 2 diabetes mellitus with hyperglycemia: Secondary | ICD-10-CM | POA: Diagnosis not present

## 2021-12-11 DIAGNOSIS — E1165 Type 2 diabetes mellitus with hyperglycemia: Secondary | ICD-10-CM | POA: Diagnosis not present

## 2021-12-12 DIAGNOSIS — E1165 Type 2 diabetes mellitus with hyperglycemia: Secondary | ICD-10-CM | POA: Diagnosis not present

## 2021-12-13 DIAGNOSIS — E8722 Chronic metabolic acidosis: Secondary | ICD-10-CM | POA: Diagnosis not present

## 2021-12-13 DIAGNOSIS — E1122 Type 2 diabetes mellitus with diabetic chronic kidney disease: Secondary | ICD-10-CM | POA: Diagnosis not present

## 2021-12-13 DIAGNOSIS — D472 Monoclonal gammopathy: Secondary | ICD-10-CM | POA: Diagnosis not present

## 2021-12-13 DIAGNOSIS — D638 Anemia in other chronic diseases classified elsewhere: Secondary | ICD-10-CM | POA: Diagnosis not present

## 2021-12-13 DIAGNOSIS — E1165 Type 2 diabetes mellitus with hyperglycemia: Secondary | ICD-10-CM | POA: Diagnosis not present

## 2021-12-13 DIAGNOSIS — E211 Secondary hyperparathyroidism, not elsewhere classified: Secondary | ICD-10-CM | POA: Diagnosis not present

## 2021-12-13 DIAGNOSIS — E559 Vitamin D deficiency, unspecified: Secondary | ICD-10-CM | POA: Diagnosis not present

## 2021-12-13 DIAGNOSIS — I129 Hypertensive chronic kidney disease with stage 1 through stage 4 chronic kidney disease, or unspecified chronic kidney disease: Secondary | ICD-10-CM | POA: Diagnosis not present

## 2021-12-13 DIAGNOSIS — N170 Acute kidney failure with tubular necrosis: Secondary | ICD-10-CM | POA: Diagnosis not present

## 2021-12-13 DIAGNOSIS — D508 Other iron deficiency anemias: Secondary | ICD-10-CM | POA: Diagnosis not present

## 2021-12-13 DIAGNOSIS — R808 Other proteinuria: Secondary | ICD-10-CM | POA: Diagnosis not present

## 2021-12-13 DIAGNOSIS — N189 Chronic kidney disease, unspecified: Secondary | ICD-10-CM | POA: Diagnosis not present

## 2021-12-14 DIAGNOSIS — E1165 Type 2 diabetes mellitus with hyperglycemia: Secondary | ICD-10-CM | POA: Diagnosis not present

## 2021-12-15 DIAGNOSIS — E1165 Type 2 diabetes mellitus with hyperglycemia: Secondary | ICD-10-CM | POA: Diagnosis not present

## 2021-12-16 DIAGNOSIS — E1165 Type 2 diabetes mellitus with hyperglycemia: Secondary | ICD-10-CM | POA: Diagnosis not present

## 2021-12-17 DIAGNOSIS — E1165 Type 2 diabetes mellitus with hyperglycemia: Secondary | ICD-10-CM | POA: Diagnosis not present

## 2021-12-18 DIAGNOSIS — E1165 Type 2 diabetes mellitus with hyperglycemia: Secondary | ICD-10-CM | POA: Diagnosis not present

## 2021-12-19 DIAGNOSIS — E1165 Type 2 diabetes mellitus with hyperglycemia: Secondary | ICD-10-CM | POA: Diagnosis not present

## 2021-12-20 DIAGNOSIS — E1165 Type 2 diabetes mellitus with hyperglycemia: Secondary | ICD-10-CM | POA: Diagnosis not present

## 2021-12-21 ENCOUNTER — Encounter (HOSPITAL_COMMUNITY): Payer: Self-pay

## 2021-12-21 ENCOUNTER — Inpatient Hospital Stay (HOSPITAL_COMMUNITY)
Admission: EM | Admit: 2021-12-21 | Discharge: 2021-12-25 | DRG: 291 | Disposition: A | Discharge status: Home / Self Care | Payer: Medicaid Other | Attending: Internal Medicine | Admitting: Internal Medicine

## 2021-12-21 ENCOUNTER — Other Ambulatory Visit: Payer: Self-pay

## 2021-12-21 ENCOUNTER — Emergency Department (HOSPITAL_COMMUNITY): Payer: Medicaid Other

## 2021-12-21 DIAGNOSIS — I132 Hypertensive heart and chronic kidney disease with heart failure and with stage 5 chronic kidney disease, or end stage renal disease: Principal | ICD-10-CM | POA: Diagnosis present

## 2021-12-21 DIAGNOSIS — Z841 Family history of disorders of kidney and ureter: Secondary | ICD-10-CM

## 2021-12-21 DIAGNOSIS — Z833 Family history of diabetes mellitus: Secondary | ICD-10-CM | POA: Diagnosis not present

## 2021-12-21 DIAGNOSIS — Z20822 Contact with and (suspected) exposure to covid-19: Secondary | ICD-10-CM | POA: Diagnosis present

## 2021-12-21 DIAGNOSIS — J9601 Acute respiratory failure with hypoxia: Secondary | ICD-10-CM | POA: Diagnosis present

## 2021-12-21 DIAGNOSIS — E1165 Type 2 diabetes mellitus with hyperglycemia: Secondary | ICD-10-CM | POA: Diagnosis present

## 2021-12-21 DIAGNOSIS — I509 Heart failure, unspecified: Secondary | ICD-10-CM | POA: Diagnosis not present

## 2021-12-21 DIAGNOSIS — N179 Acute kidney failure, unspecified: Secondary | ICD-10-CM | POA: Diagnosis not present

## 2021-12-21 DIAGNOSIS — I10 Essential (primary) hypertension: Secondary | ICD-10-CM | POA: Diagnosis present

## 2021-12-21 DIAGNOSIS — I110 Hypertensive heart disease with heart failure: Secondary | ICD-10-CM | POA: Diagnosis not present

## 2021-12-21 DIAGNOSIS — E1169 Type 2 diabetes mellitus with other specified complication: Secondary | ICD-10-CM | POA: Diagnosis present

## 2021-12-21 DIAGNOSIS — D631 Anemia in chronic kidney disease: Secondary | ICD-10-CM | POA: Diagnosis not present

## 2021-12-21 DIAGNOSIS — E785 Hyperlipidemia, unspecified: Secondary | ICD-10-CM | POA: Diagnosis present

## 2021-12-21 DIAGNOSIS — Z6841 Body Mass Index (BMI) 40.0 and over, adult: Secondary | ICD-10-CM

## 2021-12-21 DIAGNOSIS — E039 Hypothyroidism, unspecified: Secondary | ICD-10-CM | POA: Diagnosis present

## 2021-12-21 DIAGNOSIS — N185 Chronic kidney disease, stage 5: Secondary | ICD-10-CM | POA: Diagnosis present

## 2021-12-21 DIAGNOSIS — R069 Unspecified abnormalities of breathing: Secondary | ICD-10-CM | POA: Diagnosis not present

## 2021-12-21 DIAGNOSIS — I5033 Acute on chronic diastolic (congestive) heart failure: Secondary | ICD-10-CM | POA: Diagnosis present

## 2021-12-21 DIAGNOSIS — Z79899 Other long term (current) drug therapy: Secondary | ICD-10-CM | POA: Diagnosis not present

## 2021-12-21 DIAGNOSIS — E1122 Type 2 diabetes mellitus with diabetic chronic kidney disease: Secondary | ICD-10-CM | POA: Diagnosis present

## 2021-12-21 DIAGNOSIS — Z79890 Hormone replacement therapy (postmenopausal): Secondary | ICD-10-CM

## 2021-12-21 DIAGNOSIS — R739 Hyperglycemia, unspecified: Secondary | ICD-10-CM | POA: Diagnosis not present

## 2021-12-21 DIAGNOSIS — I5032 Chronic diastolic (congestive) heart failure: Secondary | ICD-10-CM | POA: Diagnosis not present

## 2021-12-21 DIAGNOSIS — R52 Pain, unspecified: Secondary | ICD-10-CM | POA: Diagnosis not present

## 2021-12-21 DIAGNOSIS — R0602 Shortness of breath: Secondary | ICD-10-CM | POA: Diagnosis not present

## 2021-12-21 DIAGNOSIS — J811 Chronic pulmonary edema: Secondary | ICD-10-CM | POA: Diagnosis not present

## 2021-12-21 LAB — COMPREHENSIVE METABOLIC PANEL
ALT: 12 U/L (ref 0–44)
AST: 17 U/L (ref 15–41)
Albumin: 3.4 g/dL — ABNORMAL LOW (ref 3.5–5.0)
Alkaline Phosphatase: 93 U/L (ref 38–126)
Anion gap: 10 (ref 5–15)
BUN: 63 mg/dL — ABNORMAL HIGH (ref 6–20)
CO2: 19 mmol/L — ABNORMAL LOW (ref 22–32)
Calcium: 8.5 mg/dL — ABNORMAL LOW (ref 8.9–10.3)
Chloride: 108 mmol/L (ref 98–111)
Creatinine, Ser: 5.82 mg/dL — ABNORMAL HIGH (ref 0.44–1.00)
GFR, Estimated: 8 mL/min — ABNORMAL LOW (ref >60)
Glucose, Bld: 243 mg/dL — ABNORMAL HIGH (ref 70–99)
Potassium: 4.3 mmol/L (ref 3.5–5.1)
Sodium: 137 mmol/L (ref 135–145)
Total Bilirubin: 0.3 mg/dL (ref 0.3–1.2)
Total Protein: 7.4 g/dL (ref 6.5–8.1)

## 2021-12-21 LAB — IRON AND TIBC
Iron: 36 ug/dL (ref 28–170)
Saturation Ratios: 12 % (ref 10.4–31.8)
TIBC: 309 ug/dL (ref 250–450)
UIBC: 273 ug/dL

## 2021-12-21 LAB — CBC WITH DIFFERENTIAL/PLATELET
Abs Immature Granulocytes: 0.02 10*3/uL (ref 0.00–0.07)
Basophils Absolute: 0 10*3/uL (ref 0.0–0.1)
Basophils Relative: 0 %
Eosinophils Absolute: 0.3 10*3/uL (ref 0.0–0.5)
Eosinophils Relative: 4 %
HCT: 23.9 % — ABNORMAL LOW (ref 36.0–46.0)
Hemoglobin: 7.5 g/dL — ABNORMAL LOW (ref 12.0–15.0)
Immature Granulocytes: 0 %
Lymphocytes Relative: 18 %
Lymphs Abs: 1.3 10*3/uL (ref 0.7–4.0)
MCH: 28.3 pg (ref 26.0–34.0)
MCHC: 31.4 g/dL (ref 30.0–36.0)
MCV: 90.2 fL (ref 80.0–100.0)
Monocytes Absolute: 0.5 10*3/uL (ref 0.1–1.0)
Monocytes Relative: 7 %
Neutro Abs: 5 10*3/uL (ref 1.7–7.7)
Neutrophils Relative %: 71 %
Platelets: 257 10*3/uL (ref 150–400)
RBC: 2.65 MIL/uL — ABNORMAL LOW (ref 3.87–5.11)
RDW: 14.6 % (ref 11.5–15.5)
WBC: 7 10*3/uL (ref 4.0–10.5)
nRBC: 0 % (ref 0.0–0.2)

## 2021-12-21 LAB — TROPONIN I (HIGH SENSITIVITY): Troponin I (High Sensitivity): 8 ng/L (ref <18)

## 2021-12-21 LAB — HEMOGLOBIN A1C
Hgb A1c MFr Bld: 8 % — ABNORMAL HIGH (ref 4.8–5.6)
Mean Plasma Glucose: 182.9 mg/dL

## 2021-12-21 LAB — BRAIN NATRIURETIC PEPTIDE: B Natriuretic Peptide: 929 pg/mL — ABNORMAL HIGH (ref 0.0–100.0)

## 2021-12-21 LAB — GLUCOSE, CAPILLARY
Glucose-Capillary: 297 mg/dL — ABNORMAL HIGH (ref 70–99)
Glucose-Capillary: 158 mg/dL — ABNORMAL HIGH (ref 70–99)
Glucose-Capillary: 151 mg/dL — ABNORMAL HIGH (ref 70–99)

## 2021-12-21 LAB — SARS CORONAVIRUS 2 BY RT PCR: SARS Coronavirus 2 by RT PCR: NEGATIVE

## 2021-12-21 LAB — FERRITIN: Ferritin: 135 ng/mL (ref 11–307)

## 2021-12-21 LAB — CBG MONITORING, ED: Glucose-Capillary: 285 mg/dL — ABNORMAL HIGH (ref 70–99)

## 2021-12-21 MED ORDER — ROSUVASTATIN CALCIUM 20 MG PO TABS
40.0000 mg | ORAL_TABLET | Freq: Every day | ORAL | Status: DC
  Administered 2021-12-21 – 2021-12-24 (×4): 40 mg via ORAL
  Filled 2021-12-21 (×4): qty 2

## 2021-12-21 MED ORDER — INSULIN ASPART 100 UNIT/ML IJ SOLN: 0.0000 [IU] | Freq: Every day | INTRAMUSCULAR | Status: DC

## 2021-12-21 MED ORDER — CHLORHEXIDINE GLUCONATE CLOTH 2 % EX PADS
6.0000 | MEDICATED_PAD | Freq: Every day | CUTANEOUS | Status: DC
  Administered 2021-12-21 – 2021-12-24 (×4): 6 via TOPICAL

## 2021-12-21 MED ORDER — INSULIN ASPART 100 UNIT/ML IJ SOLN
0.0000 [IU] | Freq: Three times a day (TID) | INTRAMUSCULAR | Status: DC
  Administered 2021-12-21: 3 [IU] via SUBCUTANEOUS
  Administered 2021-12-21: 8 [IU] via SUBCUTANEOUS
  Administered 2021-12-22: 3 [IU] via SUBCUTANEOUS
  Administered 2021-12-22: 5 [IU] via SUBCUTANEOUS
  Administered 2021-12-22 – 2021-12-23 (×2): 3 [IU] via SUBCUTANEOUS
  Administered 2021-12-23: 8 [IU] via SUBCUTANEOUS
  Administered 2021-12-24: 2 [IU] via SUBCUTANEOUS
  Administered 2021-12-24 (×2): 3 [IU] via SUBCUTANEOUS
  Administered 2021-12-25: 5 [IU] via SUBCUTANEOUS
  Administered 2021-12-25: 3 [IU] via SUBCUTANEOUS

## 2021-12-21 MED ORDER — SODIUM BICARBONATE 650 MG PO TABS
650.0000 mg | ORAL_TABLET | Freq: Four times a day (QID) | ORAL | Status: DC
  Administered 2021-12-21 – 2021-12-25 (×16): 650 mg via ORAL
  Filled 2021-12-21 (×16): qty 1

## 2021-12-21 MED ORDER — FUROSEMIDE 10 MG/ML IJ SOLN
40.0000 mg | Freq: Once | INTRAMUSCULAR | Status: AC
  Administered 2021-12-21: 40 mg via INTRAVENOUS
  Filled 2021-12-21: qty 4

## 2021-12-21 MED ORDER — AMLODIPINE BESYLATE 5 MG PO TABS
10.0000 mg | ORAL_TABLET | Freq: Every day | ORAL | Status: DC
  Administered 2021-12-21 – 2021-12-25 (×4): 10 mg via ORAL
  Filled 2021-12-21 (×4): qty 2

## 2021-12-21 MED ORDER — ACETAMINOPHEN 325 MG PO TABS
650.0000 mg | ORAL_TABLET | Freq: Four times a day (QID) | ORAL | Status: DC | PRN
  Filled 2021-12-21: qty 2

## 2021-12-21 MED ORDER — HYDRALAZINE HCL 25 MG PO TABS
75.0000 mg | ORAL_TABLET | Freq: Three times a day (TID) | ORAL | Status: DC
  Administered 2021-12-21 – 2021-12-25 (×13): 75 mg via ORAL
  Filled 2021-12-21 (×13): qty 3

## 2021-12-21 MED ORDER — HYDROXYZINE HCL 25 MG PO TABS
25.0000 mg | ORAL_TABLET | Freq: Once | ORAL | Status: AC
  Administered 2021-12-21: 25 mg via ORAL
  Filled 2021-12-21: qty 1

## 2021-12-21 MED ORDER — ACETAMINOPHEN 650 MG RE SUPP: 650.0000 mg | Freq: Four times a day (QID) | RECTAL | Status: DC | PRN

## 2021-12-21 MED ORDER — HEPARIN SODIUM (PORCINE) 5000 UNIT/ML IJ SOLN
5000.0000 [IU] | Freq: Three times a day (TID) | INTRAMUSCULAR | Status: DC
  Administered 2021-12-21 – 2021-12-25 (×13): 5000 [IU] via SUBCUTANEOUS
  Filled 2021-12-21 (×13): qty 1

## 2021-12-21 MED ORDER — ALBUTEROL SULFATE (2.5 MG/3ML) 0.083% IN NEBU: 2.5000 mg | INHALATION_SOLUTION | RESPIRATORY_TRACT | Status: DC | PRN

## 2021-12-21 MED ORDER — ONDANSETRON HCL 4 MG/2ML IJ SOLN: 4.0000 mg | Freq: Four times a day (QID) | INTRAMUSCULAR | Status: DC | PRN

## 2021-12-21 MED ORDER — LEVOTHYROXINE SODIUM 50 MCG PO TABS
50.0000 ug | ORAL_TABLET | Freq: Every day | ORAL | Status: DC
  Administered 2021-12-21 – 2021-12-25 (×4): 50 ug via ORAL
  Filled 2021-12-21 (×4): qty 2
  Filled 2021-12-21: qty 1

## 2021-12-21 MED ORDER — CARVEDILOL 12.5 MG PO TABS
25.0000 mg | ORAL_TABLET | Freq: Two times a day (BID) | ORAL | Status: DC
  Administered 2021-12-21 – 2021-12-25 (×8): 25 mg via ORAL
  Filled 2021-12-21 (×8): qty 2

## 2021-12-21 MED ORDER — FUROSEMIDE 10 MG/ML IJ SOLN
160.0000 mg | Freq: Three times a day (TID) | INTRAVENOUS | Status: DC
  Administered 2021-12-21 – 2021-12-24 (×10): 160 mg via INTRAVENOUS
  Filled 2021-12-21 (×12): qty 16

## 2021-12-21 MED ORDER — ONDANSETRON HCL 4 MG PO TABS: 4.0000 mg | ORAL_TABLET | Freq: Four times a day (QID) | ORAL | Status: DC | PRN

## 2021-12-21 MED ORDER — FUROSEMIDE 10 MG/ML IJ SOLN: 80.0000 mg | Freq: Two times a day (BID) | INTRAMUSCULAR | Status: DC

## 2021-12-21 NOTE — Progress Notes (Signed)
Patient placed on BIPAP for the night.  Tolerating well at this time.

## 2021-12-21 NOTE — ED Triage Notes (Addendum)
BIB by EMS with complaints of SHOB that started yesterday, worse when walking. States that she is having hot flashes. Sats 80% on RA on arrival. Patient demanding to be fanned. HX of CHF. Placed on oxygen at 5LPM. Denies pain. Reports that she has been out of her fluid pills but does not know how long. Noted with edema to bilateral feet.

## 2021-12-21 NOTE — Progress Notes (Addendum)
Pt set up on bipap per MD order.  Pt her with SOB due to possible CHF.   Pt is on 10/5, R 16, 50% FIO2.  Pt is tolerating well.  RT will f/u to see how she is doing.

## 2021-12-21 NOTE — ED Provider Notes (Signed)
[facility] EMERGENCY DEPARTMENT Provider Note   CSN: [id number] Arrival date & time: 12/21/21  8119     History Chief Complaint  Patient presents with   Shortness of Breath    HPI 51 year old female with extensive history of HTN, HLD, dCHF, DM 2, CKD 3b, presenting today ED with chief complaint of shortness of breath, and fatigue. She states that she ran out of her Lasix approximately a week ago.  She is endorsing shortness of breath today.  She denies fevers or chills, nausea vomiting, syncope or chest pain.  She is otherwise ambulatory tolerating p.o. intake.  She has not been using the oxygen at home.  Was satting 70% with EMS and 80% on arrival on room air. No known sick contacts.  Patient's recorded medical, surgical, social, medication list and allergies were reviewed in the Snapshot window as part of the initial history.   Review of Systems   Review of Systems  Constitutional:  Negative for chills and fever.  HENT:  Negative for ear pain and sore throat.   Eyes:  Negative for pain and visual disturbance.  Respiratory:  Positive for shortness of breath. Negative for cough.   Cardiovascular:  Positive for leg swelling. Negative for chest pain and palpitations.  Gastrointestinal:  Negative for abdominal pain and vomiting.  Genitourinary:  Negative for dysuria and hematuria.  Musculoskeletal:  Negative for arthralgias and back pain.  Skin:  Negative for color change and rash.  Neurological:  Negative for seizures and syncope.  All other systems reviewed and are negative.   Physical Exam Updated Vital Signs BP (!) 166/63   Pulse 69   Temp 97.6 F (36.4 C) (Axillary)   Resp (!) 25   Ht 5' (1.524 m)   Wt 93 kg   LMP 09/22/2019 (Approximate)   SpO2 100%   BMI 40.04 kg/m  Physical Exam Vitals and nursing note reviewed.  Constitutional:      General: She is not in acute distress.    Appearance: She is well-developed.  HENT:     Head: Normocephalic and atraumatic.   Eyes:     Conjunctiva/sclera: Conjunctivae normal.  Cardiovascular:     Rate and Rhythm: Normal rate and regular rhythm.     Heart sounds: No murmur heard. Pulmonary:     Effort: Respiratory distress present.     Breath sounds: Normal breath sounds.  Abdominal:     Palpations: Abdomen is soft.     Tenderness: There is no abdominal tenderness.  Musculoskeletal:        General: No swelling.     Cervical back: Neck supple.     Right lower leg: Edema present.     Left lower leg: Edema present.  Skin:    General: Skin is warm and dry.     Capillary Refill: Capillary refill takes less than 2 seconds.  Neurological:     Mental Status: She is alert.  Psychiatric:        Mood and Affect: Mood normal.      ED Course/ Medical Decision Making/ A&P    Procedures Procedures   Medications Ordered in ED Medications  hydrOXYzine (ATARAX) tablet 25 mg (has no administration in time range)  furosemide (LASIX) injection 40 mg (40 mg Intravenous Given 12/21/21 0824)    Medical Decision Making:    Ana [name] is a 51 y.o. female who presented to the ED today with shortness of breath detailed above.     Handoff received from EMS.  Patient's presentation is complicated by their history of multiple comorbid medical conditions including chronic outpatient medication regimens.  Patient placed on continuous vitals and telemetry monitoring while in ED which was reviewed periodically.   Complete initial physical exam performed, notably the patient  was with lower extremity edema, tachypnea, desaturations to 70% requiring initiation of oxygen therapy.      Reviewed and confirmed nursing documentation for past medical history, family history, social history.    Initial Assessment:   With the patient's presentation of shortness of breath, most likely diagnosis is heart failure exacerbation. Other diagnoses were considered including (but not limited to) COPD exacerbation, ACS, pulmonary  embolism, pneumonia, pneumothorax. These are considered less likely due to history of present illness and physical exam findings.   This is most consistent with an acute life/limb threatening illness complicated by underlying chronic conditions.  Initial Plan:  Screening labs including CBC and Metabolic panel to evaluate for infectious or metabolic etiology of disease.  CXR to evaluate for structural/infectious intrathoracic pathology.  BNP, troponin, EKG to evaluate for cardiac pathology. Objective evaluation as below reviewed with plan for close reassessment  Initial Study Results:   Laboratory  All laboratory results reviewed without evidence of clinically relevant pathology.   Exceptions include: elevated BNP   EKG EKG was reviewed independently. Rate, rhythm, axis, intervals all examined and without medically relevant abnormality. ST segments without concerns for elevations.    Radiology  All images reviewed independently. Agree with radiology report at this time.   DG Chest Portable 1 View  Result Date: 12/21/2021 CLINICAL DATA:  Shortness of breath EXAM: PORTABLE CHEST 1 VIEW COMPARISON:  11/13/21 CXR FINDINGS: Low lung volumes. Enlarged cardiac and mediastinal contours, likely similar to prior exam when accounting for differences in lung volumes. Redemonstrated are hazy bilateral pulmonary opacities and prominent interstitial opacities, suggestive of pulmonary edema, similar to prior exam. There are more focal bibasilar pulmonary opacities which are nonspecific and may represent a combination of pleural effusion and atelectasis in the setting of low lung volumes and pulmonary edema. No displaced rib fractures. Degenerative changes of the bilateral AC joints. The upper abdomen is poorly visualized, but is without a radiographically discernible abnormality. IMPRESSION: 1. Cardiomegaly and pulmonary edema. 2. More focal bibasilar pulmonary opacities are nonspecific and may represent a  combination of pleural effusion and atelectasis in the setting of low lung volumes and pulmonary edema. Electronically Signed   By: Marin Roberts M.D.   On: 12/21/2021 08:04     Consults:  Case discussed with hospitalist.   Final Assessment and Plan:   Given patient's findings consistent with volume overloaded congestive heart failure exacerbation.  Patient started on IV Lasix.  Patient required continued oxygen therapy and was stabilized on 4 L nasal cannula in the emergency department.  Patient will require ongoing inpatient care and management.  Case discussed with hospitalist who agreed with need for admission.   Disposition:   Based on the above findings, I believe this patient is stable for admission.    Patient/family educated about specific findings on our evaluation and explained exact reasons for admission.  Patient/family educated about clinical situation and time was allowed to answer questions.   Admission team communicated with and agreed with need for admission. Patient admitted. Patient  ready to move at this time.     Emergency Department Medication Summary:   Medications  hydrOXYzine (ATARAX) tablet 25 mg (has no administration in time range)  furosemide (LASIX) injection 40 mg (40 mg  Intravenous Given 12/21/21 0824)         Clinical Impression:  1. Acute on chronic congestive heart failure, unspecified heart failure type ([place])      Admit   Final Clinical Impression(s) / ED Diagnoses Final diagnoses:  Acute on chronic congestive heart failure, unspecified heart failure type [facility])    Rx / DC Orders ED Discharge Orders     None         Tretha Sciara, MD 12/21/21 [phone]

## 2021-12-21 NOTE — Consult Note (Signed)
Reason for Consult:AKI/CKD stage IV Referring Physician: Roderic Palau, MD  Ana [name] is an 51 y.o. female with a PMH significant for DM, HTN, obesity, chronic diastolic CHF, and CKD Stage IV (followed by Dr. Theador Hawthorne) who presented to [facility] ED this morning with worsening SOB.  Per her report, she ran out of furosemide a week ago and started noticing increased lower extremity edema.  She also developed orthopnea.  In the ED, SpO2 80% on RA with increased respiratory rate.  Labs noted for BNP 929, BUN 63, Cr 5.82, K 4.3, Co2 19, alb 3.4, Hgb 7.5.  CXR with cardiomegaly and pulmonary edema.  We were consulted due to her advanced CKD stage IV-V.  Her baseline Scr has been 5.15-6.12 over the past 2 months up from 3.75 6 months ago.  She has been told by Dr. Theador Hawthorne that she would likely need dialysis and was to see Dr. Donnetta Hutching this week for access placement, however she had to reschedule.  The trend in Scr is seen below.   She denies any N/V/anorexia, dysgeusia, chest pain, or hemoptysis.  Trend in Creatinine: Creatinine, Ser  Date/Time Value Ref Range Status  12/21/2021 07:19 AM 5.82 (H) 0.44 - 1.00 mg/dL Final  11/16/2021 04:22 AM 6.12 (H) 0.44 - 1.00 mg/dL Final  11/15/2021 05:48 AM 5.55 (H) 0.44 - 1.00 mg/dL Final  11/14/2021 04:25 AM 5.41 (H) 0.44 - 1.00 mg/dL Final  11/13/2021 09:01 AM 5.15 (H) 0.44 - 1.00 mg/dL Final  06/17/2021 02:30 PM 3.75 (H) 0.44 - 1.00 mg/dL Final  10/01/2020 11:39 AM 2.16 (H) 0.50 - 1.03 mg/dL Final  09/20/2020 10:58 AM 2.18 (H) 0.50 - 1.03 mg/dL Final  09/02/2020 12:00 AM 2.10 (H) 0.50 - 1.03 mg/dL Final  04/30/2020 06:26 AM 1.81 (H) 0.44 - 1.00 mg/dL Final  12/17/2018 01:24 AM 0.86 0.44 - 1.00 mg/dL Final  12/16/2018 09:56 PM 0.93 0.44 - 1.00 mg/dL Final  09/25/2017 05:34 PM 0.77 0.44 - 1.00 mg/dL Final  08/17/2015 04:25 AM 0.69 0.44 - 1.00 mg/dL Final  08/11/2015 09:00 PM 0.67 0.44 - 1.00 mg/dL Final  10/25/2014 10:30 PM 0.66 0.44 - 1.00 mg/dL Final  07/24/2014 05:40  PM 0.52 0.44 - 1.00 mg/dL Final  01/05/2013 12:17 PM 0.48 (L) 0.50 - 1.10 mg/dL Final  10/25/2011 01:20 PM 0.50 0.50 - 1.10 mg/dL Final  07/11/2011 08:32 AM 0.43 (L) 0.50 - 1.10 mg/dL Final  06/20/2007 11:42 PM 0.56  Final  09/24/2006 06:25 PM 0.47  Final  09/22/2006 06:05 PM 0.47  Final    PMH:   Past Medical History:  Diagnosis Date   CKD (chronic kidney disease)    Diabetes mellitus    diet controlled   Hypertension     PSH:   Past Surgical History:  Procedure Laterality Date   CESAREAN SECTION  x4   CHOLECYSTECTOMY     EYE SURGERY     MASS EXCISION  10/27/2011   Procedure: EXCISION MASS;  Surgeon: Donato Heinz, MD;  Location: [facility];  Service: General;  Laterality: Right;   TUBAL LIGATION      Allergies:  Allergies  Allergen Reactions   Ranitidine Rash   Ranitidine Hcl Rash    Medications:   Prior to Admission medications   Medication Sig Start Date End Date Taking? Authorizing Provider  acetaminophen (TYLENOL) 325 MG tablet Take 2 tablets (650 mg total) by mouth every 6 (six) hours as needed for mild pain or moderate pain (or Fever >/= 101). 11/16/21  Yes Arrien,  Jimmy Picket, MD  amLODipine (NORVASC) 10 MG tablet TAKE 1 TABLET BY MOUTH DAILY 12/01/21  Yes Fay Records, MD  carvedilol (COREG) 25 MG tablet Take 1 tablet (25 mg total) by mouth 2 (two) times daily. 11/16/21 12/21/21 Yes Arrien, Jimmy Picket, MD  furosemide (LASIX) 40 MG tablet Take 80 mg by mouth 2 (two) times daily. 11/24/21 11/24/22 Yes [provider]  hydrALAZINE (APRESOLINE) 50 MG tablet Take 1.5 tablets (75 mg total) by mouth 3 (three) times daily. 12/01/21 07/29/22 Yes Fay Records, MD  levothyroxine (SYNTHROID) 50 MCG tablet TAKE 1 TABLET(50 MCG) BY MOUTH DAILY Patient taking differently: Take 50 mcg by mouth daily. 03/14/21  Yes Paseda, Dewaine Conger, FNP  losartan (COZAAR) 100 MG tablet Take 100 mg by mouth daily. 12/13/21  Yes [provider]  rosuvastatin (CRESTOR) 40 MG  tablet Take 1 tablet (40 mg total) by mouth daily with supper. 06/21/21 06/16/22 Yes Fay Records, MD  sodium bicarbonate 650 MG tablet Take 650 mg by mouth 4 (four) times daily.   Yes [provider]    Inpatient medications:  amLODipine  10 mg Oral Daily   carvedilol  25 mg Oral BID   Chlorhexidine Gluconate Cloth  6 each Topical Daily   furosemide  80 mg Intravenous BID   heparin  5,000 Units Subcutaneous Q8H   hydrALAZINE  75 mg Oral TID   insulin aspart  0-15 Units Subcutaneous TID WC   insulin aspart  0-5 Units Subcutaneous QHS   levothyroxine  50 mcg Oral Daily   rosuvastatin  40 mg Oral Q supper   sodium bicarbonate  650 mg Oral QID    Discontinued Meds:  There are no discontinued medications.  Social History:  reports that she has never smoked. She has never used smokeless tobacco. She reports that she does not currently use alcohol. She reports that she does not use drugs.  Family History:   Family History  Problem Relation Age of Onset   Kidney failure Mother    Diabetes Mother    Kidney disease Mother    Kidney failure Father    Kidney disease Father    Kidney failure Brother    Diabetes Brother    Diabetes Brother     Pertinent items are noted in HPI. Weight change:  No intake or output data in the 24 hours ending 12/21/21 1041 BP (!) 146/95   Pulse 70   Temp 97.6 F (36.4 C) (Axillary)   Resp (!) 24   Ht 5' (1.524 m)   Wt 93 kg   LMP 09/22/2019 (Approximate)   SpO2 100%   BMI 40.04 kg/m  Vitals:   12/21/21 0800 12/21/21 0830 12/21/21 0900 12/21/21 0930  BP: (!) 167/65 (!) 172/64 (!) 166/63 (!) 146/95  Pulse: 69 69 69 70  Resp: (!) 37 (!) 28 (!) 25 (!) 24  Temp:      TempSrc:      SpO2: 97% 100% 100% 100%  Weight:      Height:         General appearance: alert, cooperative, and mild distress Head: Normocephalic, without obvious abnormality, atraumatic Resp: diminished breath sounds bilaterally Cardio: regular rate and rhythm, S1, S2  normal, no murmur, click, rub or gallop GI: soft, non-tender; bowel sounds normal; no masses,  no organomegaly Extremities: edema 1+ pretibial edema bilaterally Neuro:  AA&O x3, no asterixis  Labs: Basic Metabolic Panel: Recent Labs  Lab 12/21/21 0719  NA 137  K  4.3  CL 108  CO2 19*  GLUCOSE 243*  BUN 63*  CREATININE 5.82*  ALBUMIN 3.4*  CALCIUM 8.5*   Liver Function Tests: Recent Labs  Lab 12/21/21 0719  AST 17  ALT 12  ALKPHOS 93  BILITOT 0.3  PROT 7.4  ALBUMIN 3.4*   No results for input(s): "LIPASE", "AMYLASE" in the last 168 hours. No results for input(s): "AMMONIA" in the last 168 hours. CBC: Recent Labs  Lab 12/21/21 0719  WBC 7.0  NEUTROABS 5.0  HGB 7.5*  HCT 23.9*  MCV 90.2  PLT 257   PT/INR: @LABRCNTIP (inr:5) Cardiac Enzymes: )No results for input(s): "CKTOTAL", "CKMB", "CKMBINDEX", "TROPONINI" in the last 168 hours. CBG: Recent Labs  Lab 12/21/21 1029  GLUCAP 285*    Iron Studies: No results for input(s): "IRON", "TIBC", "TRANSFERRIN", "FERRITIN" in the last 168 hours.  Xrays/Other Studies: DG Chest Portable 1 View  Result Date: 12/21/2021 CLINICAL DATA:  Shortness of breath EXAM: PORTABLE CHEST 1 VIEW COMPARISON:  11/13/21 CXR FINDINGS: Low lung volumes. Enlarged cardiac and mediastinal contours, likely similar to prior exam when accounting for differences in lung volumes. Redemonstrated are hazy bilateral pulmonary opacities and prominent interstitial opacities, suggestive of pulmonary edema, similar to prior exam. There are more focal bibasilar pulmonary opacities which are nonspecific and may represent a combination of pleural effusion and atelectasis in the setting of low lung volumes and pulmonary edema. No displaced rib fractures. Degenerative changes of the bilateral AC joints. The upper abdomen is poorly visualized, but is without a radiographically discernible abnormality. IMPRESSION: 1. Cardiomegaly and pulmonary edema. 2. More focal  bibasilar pulmonary opacities are nonspecific and may represent a combination of pleural effusion and atelectasis in the setting of low lung volumes and pulmonary edema. Electronically Signed   By: Marin Roberts M.D.   On: 12/21/2021 08:04     Assessment/Plan:  AKI/CKD stage V - has had progressive CKD over the past 6 months from stage IV now appears to be stage V according to recent lab trends.  No emergent indication for dialysis, however if she does not respond to IV lasix, we may need to proceed with HD.  Would consult surgery for [facility] if no improvement overnight.  Respiratory status too tenuous to place HD catheter at this time.  - Avoid nephrotoxic medications including NSAIDs and iodinated intravenous contrast exposure unless the latter is absolutely indicated.  Preferred narcotic agents for pain control are hydromorphone, fentanyl, and methadone. Morphine should not be used. Avoid Baclofen and avoid oral sodium phosphate and magnesium citrate based laxatives / bowel preps. Continue strict Input and Output monitoring. Will monitor the patient closely with you and intervene or adjust therapy as indicated by changes in clinical status/labs  Acute hypoxic respiratory failure - due to progressive CKD and volume overload.   Currently 100% on 7L [place] but increased WOB and likely will need BiPAP. Acute on chronic diastolic CHF- due to lack of furosemide at home.  Will dose with IV lasix but increase dose and frequency to 160 mg tid and follow response. Anemia of CKD stage V - will check iron stores and start ESA once CHF has resolved.  Transfuse prn. Vascular access - will need AVF/AVG placed +/- [facility] pending her response to IV lasix. DM type 2 - per primary HTN - stable.  Taken off ARB 3 weeks ago due to worsening renal function.    Governor Rooks Wayman Hoard 12/21/2021, 10:41 AM

## 2021-12-21 NOTE — H&P (Signed)
History and Physical    Ana [name] EQA:[id number] DOB: Jul 03, 1970 DOA: 12/21/2021  PCP: System, Provider Not In  Patient coming from: Home  I have personally briefly reviewed patient's old medical records in [place]  Chief Complaint: Shortness of breath  HPI: Ana [name] is a 51 y.o. female with medical history significant of CKD stage V, diastolic heart failure, diabetes which is diet-controlled, hypertension, hypothyroidism, presents to the emergency room with shortness of breath.  Describes that her symptoms began in the last 24 hours.  She does not have any chest pain.  She is chronically on Lasix, but says that she ran out of Lasix approximately 1 week ago.  She has noticed worsening edema.  She is has trouble laying flat.  Denies any chest pain, cough, fever, nausea, vomiting.  She has been following with a nephrologist.  Her daughter says that they have talked about potentially starting dialysis, but she does not have any permanent dialysis access as of yet.  ED Course: On arrival to the emergency room, she was noted to have oxygen saturations of 80% on room air and is tachypneic.  She is noted to have lower extremity edema.  Chest x-ray shows pulmonary edema.  BNP is elevated.  Review of Systems: As per HPI otherwise 10 point review of systems negative.    Past Medical History:  Diagnosis Date   CKD (chronic kidney disease)    Diabetes mellitus    diet controlled   Hypertension     Past Surgical History:  Procedure Laterality Date   CESAREAN SECTION  x4   CHOLECYSTECTOMY     EYE SURGERY     MASS EXCISION  10/27/2011   Procedure: EXCISION MASS;  Surgeon: Donato Heinz, MD;  Location: [facility];  Service: General;  Laterality: Right;   TUBAL LIGATION      Social History:  reports that she has never smoked. She has never used smokeless tobacco. She reports that she does not currently use alcohol. She reports that she does not use drugs.  Allergies   Allergen Reactions   Ranitidine Rash   Ranitidine Hcl Rash    Family History  Problem Relation Age of Onset   Kidney failure Mother    Diabetes Mother    Kidney disease Mother    Kidney failure Father    Kidney disease Father    Kidney failure Brother    Diabetes Brother    Diabetes Brother      Prior to Admission medications   Medication Sig Start Date End Date Taking? Authorizing Provider  acetaminophen (TYLENOL) 325 MG tablet Take 2 tablets (650 mg total) by mouth every 6 (six) hours as needed for mild pain or moderate pain (or Fever >/= 101). 11/16/21  Yes Arrien, Jimmy Picket, MD  amLODipine (NORVASC) 10 MG tablet TAKE 1 TABLET BY MOUTH DAILY 12/01/21  Yes Fay Records, MD  carvedilol (COREG) 25 MG tablet Take 1 tablet (25 mg total) by mouth 2 (two) times daily. 11/16/21 12/21/21 Yes Arrien, Jimmy Picket, MD  furosemide (LASIX) 40 MG tablet Take 80 mg by mouth 2 (two) times daily. 11/24/21 11/24/22 Yes [provider]  hydrALAZINE (APRESOLINE) 50 MG tablet Take 1.5 tablets (75 mg total) by mouth 3 (three) times daily. 12/01/21 07/29/22 Yes Fay Records, MD  levothyroxine (SYNTHROID) 50 MCG tablet TAKE 1 TABLET(50 MCG) BY MOUTH DAILY Patient taking differently: Take 50 mcg by mouth daily. 03/14/21  Yes Paseda, Dewaine Conger, FNP  losartan (COZAAR) 100 MG tablet Take 100 mg by mouth daily. 12/13/21  Yes [provider]  rosuvastatin (CRESTOR) 40 MG tablet Take 1 tablet (40 mg total) by mouth daily with supper. 06/21/21 06/16/22 Yes Fay Records, MD  sodium bicarbonate 650 MG tablet Take 650 mg by mouth 4 (four) times daily.   Yes [provider]    Physical Exam: Vitals:   12/21/21 0800 12/21/21 0830 12/21/21 0900 12/21/21 0930  BP: (!) 167/65 (!) 172/64 (!) 166/63 (!) 146/95  Pulse: 69 69 69 70  Resp: (!) 37 (!) 28 (!) 25 (!) 24  Temp:      TempSrc:      SpO2: 97% 100% 100% 100%  Weight:      Height:        Constitutional: NAD, calm,  comfortable Eyes: PERRL, lids and conjunctivae normal ENMT: Mucous membranes are moist. Posterior pharynx clear of any exudate or lesions.Normal dentition.  Neck: normal, supple, no masses, no thyromegaly Respiratory: Creased respiratory effort, difficulty completing a sentence.  Bilateral crackles and wheezes.  1-2+ pitting edema bilaterally lower extremities Cardiovascular: Regular rate and rhythm, no murmurs / rubs / gallops.  2+ pedal pulses. No carotid bruits.  Abdomen: no tenderness, no masses palpated. No hepatosplenomegaly. Bowel sounds positive.  Musculoskeletal: no clubbing / cyanosis. No joint deformity upper and lower extremities. Good ROM, no contractures. Normal muscle tone.  Skin: no rashes, lesions, ulcers. No induration Neurologic: CN 2-12 grossly intact. Sensation intact, DTR normal. Strength 5/5 in all 4.  Psychiatric: Normal judgment and insight. Alert and oriented x 3. Normal mood.    Labs on Admission: I have personally reviewed following labs and imaging studies  CBC: Recent Labs  Lab 12/21/21 0719  WBC 7.0  NEUTROABS 5.0  HGB 7.5*  HCT 23.9*  MCV 90.2  PLT 638   Basic Metabolic Panel: Recent Labs  Lab 12/21/21 0719  NA 137  K 4.3  CL 108  CO2 19*  GLUCOSE 243*  BUN 63*  CREATININE 5.82*  CALCIUM 8.5*   GFR: Estimated Creatinine Clearance: 11.6 mL/min (A) (by C-G formula based on SCr of 5.82 mg/dL (H)). Liver Function Tests: Recent Labs  Lab 12/21/21 0719  AST 17  ALT 12  ALKPHOS 93  BILITOT 0.3  PROT 7.4  ALBUMIN 3.4*   No results for input(s): "LIPASE", "AMYLASE" in the last 168 hours. No results for input(s): "AMMONIA" in the last 168 hours. Coagulation Profile: No results for input(s): "INR", "PROTIME" in the last 168 hours. Cardiac Enzymes: No results for input(s): "CKTOTAL", "CKMB", "CKMBINDEX", "TROPONINI" in the last 168 hours. BNP (last 3 results) No results for input(s): "PROBNP" in the last 8760 hours. HbA1C: No results  for input(s): "HGBA1C" in the last 72 hours. CBG: No results for input(s): "GLUCAP" in the last 168 hours. Lipid Profile: No results for input(s): "CHOL", "HDL", "LDLCALC", "TRIG", "CHOLHDL", "LDLDIRECT" in the last 72 hours. Thyroid Function Tests: No results for input(s): "TSH", "T4TOTAL", "FREET4", "T3FREE", "THYROIDAB" in the last 72 hours. Anemia Panel: No results for input(s): "VITAMINB12", "FOLATE", "FERRITIN", "TIBC", "IRON", "RETICCTPCT" in the last 72 hours. Urine analysis:    Component Value Date/Time   COLORURINE YELLOW 11/13/2021 1007   APPEARANCEUR CLOUDY (A) 11/13/2021 1007   LABSPEC 1.010 11/13/2021 1007   PHURINE 6.0 11/13/2021 1007   GLUCOSEU >=500 (A) 11/13/2021 1007   HGBUR SMALL (A) 11/13/2021 1007   BILIRUBINUR NEGATIVE 11/13/2021 1007   BILIRUBINUR negative 01/07/2013 1149   KETONESUR NEGATIVE 11/13/2021  1007   PROTEINUR >=300 (A) 11/13/2021 1007   UROBILINOGEN 0.2 10/25/2014 2235   NITRITE NEGATIVE 11/13/2021 1007   LEUKOCYTESUR LARGE (A) 11/13/2021 1007    Radiological Exams on Admission: DG Chest Portable 1 View  Result Date: 12/21/2021 CLINICAL DATA:  Shortness of breath EXAM: PORTABLE CHEST 1 VIEW COMPARISON:  11/13/21 CXR FINDINGS: Low lung volumes. Enlarged cardiac and mediastinal contours, likely similar to prior exam when accounting for differences in lung volumes. Redemonstrated are hazy bilateral pulmonary opacities and prominent interstitial opacities, suggestive of pulmonary edema, similar to prior exam. There are more focal bibasilar pulmonary opacities which are nonspecific and may represent a combination of pleural effusion and atelectasis in the setting of low lung volumes and pulmonary edema. No displaced rib fractures. Degenerative changes of the bilateral AC joints. The upper abdomen is poorly visualized, but is without a radiographically discernible abnormality. IMPRESSION: 1. Cardiomegaly and pulmonary edema. 2. More focal bibasilar pulmonary  opacities are nonspecific and may represent a combination of pleural effusion and atelectasis in the setting of low lung volumes and pulmonary edema. Electronically Signed   By: Marin Roberts M.D.   On: 12/21/2021 08:04    EKG: Independently reviewed.  Sinus rhythm without acute ST or T changes  Assessment/Plan Principal Problem:   Acute on chronic diastolic CHF (congestive heart failure) (HCC) Active Problems:   Acute respiratory failure with hypoxia (HCC)   Essential hypertension, benign   Hypothyroidism, adult   Type 2 diabetes mellitus with hyperlipidemia (HCC)   Class 3 obesity (HCC)   CKD (chronic kidney disease) stage 5, GFR less than 15 ml/min (HCC)     Acute respiratory failure with hypoxia -Secondary to pulmonary edema -Currently on oxygen at 7 L with increased work of breathing -May need BiPAP for work of breathing if does not improve with diuresis -Wean off oxygen as tolerated  Acute on chronic diastolic congestive heart failure -Recent echocardiogram from 10/2021 showed normal EF with grade 2 diastolic dysfunction -We will start on intravenous Lasix -Continue on carvedilol -Not on ARB due to renal dysfunction  CKD stage V -Creatinine is near baseline -Can continue to monitor renal function with diuresis -Nephrology consulted in case she needs to be started on dialysis  Hypertension -Continue home dose of Norvasc, carvedilol, hydralazine  Hypothyroidism -Continue Synthroid  Diabetes type 2 -Diet controlled -Started on sliding scale insulin  Obesity, class III -BMI 40 -Discussed importance of healthy eating habits and physical activity  DVT prophylaxis: Heparin  Code Status: Full code Family Communication: Discussed with daughter at the bedside Disposition Plan: Will discharge home once adequately diuresed Consults called: Nephrology Admission status: Inpatient, stepdown  Kathie Dike MD [facility]   If 7PM-7AM, please contact  night-coverage [email or web address]   12/21/2021, 10:26 AM

## 2021-12-21 NOTE — Inpatient Diabetes Management (Addendum)
Inpatient Diabetes Program Recommendations  AACE/ADA: New Consensus Statement on Inpatient Glycemic Control (2015)  Target Ranges:  Prepandial:   less than 140 mg/dL      Peak postprandial:   less than 180 mg/dL (1-2 hours)      Critically ill patients:  140 - 180 mg/dL   Lab Results  Component Value Date   GLUCAP 297 (H) 12/21/2021   HGBA1C 8.3 (H) 06/17/2021    Review of Glycemic Control  Latest Reference Range & Units 12/21/21 10:29 12/21/21 11:15  Glucose-Capillary 70 - 99 mg/dL 285 (H) 297 (H)   Diabetes history: DM 2 Outpatient Diabetes medications: glipizide in the past Current orders for Inpatient glycemic control:  Novolog 0-15 units tid + hs  Hgb 7.5,  A1c would not be accurate Note glipizide was discontinued last hospitalization on 9/20  Inpatient Diabetes Program Recommendations:    -  Due to renal function may need to reduce Novolog Correction to 0-9 units tid  Thanks,  Tama Headings RN, MSN, BC-ADM Inpatient Diabetes Coordinator Team Pager [phone] (8a-5p)

## 2021-12-22 DIAGNOSIS — I5033 Acute on chronic diastolic (congestive) heart failure: Secondary | ICD-10-CM | POA: Diagnosis not present

## 2021-12-22 DIAGNOSIS — N185 Chronic kidney disease, stage 5: Secondary | ICD-10-CM | POA: Diagnosis not present

## 2021-12-22 DIAGNOSIS — J9601 Acute respiratory failure with hypoxia: Secondary | ICD-10-CM | POA: Diagnosis not present

## 2021-12-22 LAB — TYPE AND SCREEN
ABO/RH(D): A POS
Antibody Screen: NEGATIVE

## 2021-12-22 LAB — CBC
HCT: 22.2 % — ABNORMAL LOW (ref 36.0–46.0)
HCT: 22.5 % — ABNORMAL LOW (ref 36.0–46.0)
Hemoglobin: 6.9 g/dL — CL (ref 12.0–15.0)
Hemoglobin: 7.2 g/dL — ABNORMAL LOW (ref 12.0–15.0)
MCH: 28.5 pg (ref 26.0–34.0)
MCH: 29 pg (ref 26.0–34.0)
MCHC: 31.1 g/dL (ref 30.0–36.0)
MCHC: 32 g/dL (ref 30.0–36.0)
MCV: 91.7 fL (ref 80.0–100.0)
MCV: 90.7 fL (ref 80.0–100.0)
Platelets: 241 10*3/uL (ref 150–400)
Platelets: 235 10*3/uL (ref 150–400)
RBC: 2.42 MIL/uL — ABNORMAL LOW (ref 3.87–5.11)
RBC: 2.48 MIL/uL — ABNORMAL LOW (ref 3.87–5.11)
RDW: 14.5 % (ref 11.5–15.5)
RDW: 14.6 % (ref 11.5–15.5)
WBC: 9.4 10*3/uL (ref 4.0–10.5)
WBC: 8.8 10*3/uL (ref 4.0–10.5)
nRBC: 0 % (ref 0.0–0.2)
nRBC: 0 % (ref 0.0–0.2)

## 2021-12-22 LAB — RENAL FUNCTION PANEL
Albumin: 3.2 g/dL — ABNORMAL LOW (ref 3.5–5.0)
Anion gap: 10 (ref 5–15)
BUN: 68 mg/dL — ABNORMAL HIGH (ref 6–20)
CO2: 21 mmol/L — ABNORMAL LOW (ref 22–32)
Calcium: 8.4 mg/dL — ABNORMAL LOW (ref 8.9–10.3)
Chloride: 109 mmol/L (ref 98–111)
Creatinine, Ser: 5.8 mg/dL — ABNORMAL HIGH (ref 0.44–1.00)
GFR, Estimated: 8 mL/min — ABNORMAL LOW (ref >60)
Glucose, Bld: 139 mg/dL — ABNORMAL HIGH (ref 70–99)
Phosphorus: 5.6 mg/dL — ABNORMAL HIGH (ref 2.5–4.6)
Potassium: 4.2 mmol/L (ref 3.5–5.1)
Sodium: 140 mmol/L (ref 135–145)

## 2021-12-22 LAB — HEMOGLOBIN A1C
Hgb A1c MFr Bld: 7.9 % — ABNORMAL HIGH (ref 4.8–5.6)
Mean Plasma Glucose: 180.03 mg/dL

## 2021-12-22 LAB — PROTIME-INR
INR: 1 (ref 0.8–1.2)
Prothrombin Time: 13.4 seconds (ref 11.4–15.2)

## 2021-12-22 LAB — GLUCOSE, CAPILLARY
Glucose-Capillary: 159 mg/dL — ABNORMAL HIGH (ref 70–99)
Glucose-Capillary: 197 mg/dL — ABNORMAL HIGH (ref 70–99)
Glucose-Capillary: 235 mg/dL — ABNORMAL HIGH (ref 70–99)
Glucose-Capillary: 153 mg/dL — ABNORMAL HIGH (ref 70–99)

## 2021-12-22 LAB — MRSA NEXT GEN BY PCR, NASAL: MRSA by PCR Next Gen: NOT DETECTED

## 2021-12-22 LAB — HEMOGLOBIN AND HEMATOCRIT, BLOOD
HCT: 22.3 % — ABNORMAL LOW (ref 36.0–46.0)
Hemoglobin: 7 g/dL — ABNORMAL LOW (ref 12.0–15.0)

## 2021-12-22 MED ORDER — LIVING BETTER WITH HEART FAILURE BOOK: Freq: Once | Status: AC

## 2021-12-22 MED ORDER — METOLAZONE 5 MG PO TABS
5.0000 mg | ORAL_TABLET | Freq: Every day | ORAL | Status: DC
  Administered 2021-12-24 – 2021-12-25 (×2): 5 mg via ORAL
  Filled 2021-12-22 (×3): qty 1

## 2021-12-22 MED ORDER — SODIUM CHLORIDE 0.9 % IV SOLN
250.0000 mg | Freq: Every day | INTRAVENOUS | Status: AC
  Administered 2021-12-22 – 2021-12-25 (×4): 250 mg via INTRAVENOUS
  Filled 2021-12-22 (×4): qty 20

## 2021-12-22 MED ORDER — METOLAZONE 5 MG PO TABS: 5.0000 mg | ORAL_TABLET | Freq: Two times a day (BID) | ORAL | Status: DC

## 2021-12-22 MED ORDER — ORAL CARE MOUTH RINSE: 15.0000 mL | OROMUCOSAL | Status: DC | PRN

## 2021-12-22 NOTE — Progress Notes (Signed)
Patient declined BIPAP tonight. Doing well on nasal cannula at 4 lpm sat 100%. Unit still at bedside if needed.

## 2021-12-22 NOTE — Progress Notes (Signed)
Nephrology Follow-Up Consult note   Assessment/Recommendations: Ana [name] is a/an 51 y.o. female with a past medical history significant for DM2, HTN, CHF, CKD, admitted for volume overload.        AKI/CKD stage V -progressive CKD with baseline around 6 at this time.  She has been pursuing dialysis in the outpatient setting with her nephrologist Dr. Theador Hawthorne but does not currently have access.  She was admitted for volume overload likely related to heart failure in combination with advanced kidney disease.  There was plan for her to possibly start dialysis today if she was not responding to medications.  Unfortunately urine output not well documented but it sounds like the patient is responding fairly well.  Her symptoms are significantly improved.  I think we should continue with IV diuretics today.  I will add metolazone 5 mg daily I will make the patient n.p.o. at midnight in case she does not respond to medications over the next 24 hours and I will discuss with surgery the possible need for a tunneled dialysis catheter tomorrow.  Hopefully this can be avoided.  Continue to monitor creatinine and urine output closely Continue IV diuretics with IV Lasix 160 mg 3 times daily.  Added on metolazone 5 mg daily Weight daily No uremic symptoms  Avoid nephrotoxic medications including NSAIDs and iodinated intravenous contrast exposure unless the latter is absolutely indicated.  Preferred narcotic agents for pain control are hydromorphone, fentanyl, and methadone. Morphine should not be used. Avoid Baclofen and avoid oral sodium phosphate and magnesium citrate based laxatives / bowel preps. Continue strict Input and Output monitoring. Will monitor the patient closely with you and intervene or adjust therapy as indicated by changes in clinical status/labs  Acute hypoxic respiratory failure - due to progressive CKD and volume overload.  Can you with diuresis as above Acute on chronic diastolic  CHF-possibly responding.  Continue diuresis as above Anemia of CKD stage V - Will dose IV iron. Hold ESA until CHF improves some more.  Transfuse prn. Vascular access - will need AVF/AVG placed +/- dialysis catheter pending her response to IV lasix. Dr. Constance Haw not on right now so would need temporary catheter first if needed tomorrow. Notified surgery of the possibility, NPO at MN DM type 2 - per primary HTN - stable.  Taken off ARB 3 weeks ago due to worsening renal function.    Recommendations conveyed to primary service.    [facility] 12/22/2021 11:46 AM  ___________________________________________________________  CC: volume overload, SOB  Interval History/Subjective: Patient states she feels much better today.  Breathing has significantly improved.  She feels like she is urinating a lot.  However, it is not being well collected.  Urine output not well documented with minimal in system.  Creatinine about the same   Medications:  Current Facility-Administered Medications  Medication Dose Route Frequency Provider Last Rate Last Admin   acetaminophen (TYLENOL) tablet 650 mg  650 mg Oral Q6H PRN Kathie Dike, MD       Or   acetaminophen (TYLENOL) suppository 650 mg  650 mg Rectal Q6H PRN Kathie Dike, MD       albuterol (PROVENTIL) (2.5 MG/3ML) 0.083% nebulizer solution 2.5 mg  2.5 mg Nebulization Q2H PRN Kathie Dike, MD       amLODipine (NORVASC) tablet 10 mg  10 mg Oral Daily Kathie Dike, MD   10 mg at 12/22/21 1028   carvedilol (COREG) tablet 25 mg  25 mg Oral BID Memon,  Jolaine Artist, MD   25 mg at 12/22/21 1028   Chlorhexidine Gluconate Cloth 2 % PADS 6 each  6 each Topical Daily Kathie Dike, MD   6 each at 12/22/21 1056   furosemide (LASIX) 160 mg in dextrose 5 % 50 mL IVPB  160 mg Intravenous TID Donato Heinz, MD 66 mL/hr at 12/22/21 1036 160 mg at 12/22/21 1036   heparin injection 5,000 Units  5,000 Units Subcutaneous Q8H  Kathie Dike, MD   5,000 Units at 12/22/21 0510   hydrALAZINE (APRESOLINE) tablet 75 mg  75 mg Oral TID Kathie Dike, MD   75 mg at 12/22/21 1028   insulin aspart (novoLOG) injection 0-15 Units  0-15 Units Subcutaneous TID WC Kathie Dike, MD   3 Units at 12/22/21 0804   insulin aspart (novoLOG) injection 0-5 Units  0-5 Units Subcutaneous QHS Kathie Dike, MD       levothyroxine (SYNTHROID) tablet 50 mcg  50 mcg Oral Daily Kathie Dike, MD   50 mcg at 12/22/21 0511   [START ON 12/23/2021] metolazone (ZAROXOLYN) tablet 5 mg  5 mg Oral Daily Reesa Chew, MD       ondansetron [facility]) tablet 4 mg  4 mg Oral Q6H PRN Kathie Dike, MD       Or   ondansetron (ZOFRAN) injection 4 mg  4 mg Intravenous Q6H PRN Kathie Dike, MD       rosuvastatin (CRESTOR) tablet 40 mg  40 mg Oral Q supper Kathie Dike, MD   40 mg at 12/21/21 1717   sodium bicarbonate tablet 650 mg  650 mg Oral QID Kathie Dike, MD   650 mg at 12/22/21 1028      Review of Systems: 10 systems reviewed and negative except per interval history/subjective  Physical Exam: Vitals:   12/22/21 1000 12/22/21 1135  BP: (!) 137/53   Pulse: 76   Resp: (!) 24   Temp:  98.5 F (36.9 C)  SpO2: 100%    No intake/output data recorded.  Intake/Output Summary (Last 24 hours) at 12/22/2021 1146 Last data filed at 12/22/2021 0304 Gross per 24 hour  Intake 125.35 ml  Output 250 ml  Net -124.65 ml   Constitutional: well-appearing, no acute distress ENMT: ears and nose without scars or lesions, MMM CV: normal rate, 1+ edema at the bilateral ankles Respiratory: crackles in the bilateral bases, mild increased work of breathing Gastrointestinal: soft, non-tender, no palpable masses or hernias Skin: no visible lesions or rashes Psych: alert, judgement/insight appropriate, appropriate mood and affect   Test Results I personally reviewed new and old clinical labs and radiology tests Lab Results  Component  Value Date   NA 140 12/22/2021   K 4.2 12/22/2021   CL 109 12/22/2021   CO2 21 (L) 12/22/2021   BUN 68 (H) 12/22/2021   CREATININE 5.80 (H) 12/22/2021   CALCIUM 8.4 (L) 12/22/2021   ALBUMIN 3.2 (L) 12/22/2021   PHOS 5.6 (H) 12/22/2021    CBC Recent Labs  Lab 12/21/21 0719 12/22/21 0334 12/22/21 0515  WBC 7.0 9.4  --   NEUTROABS 5.0  --   --   HGB 7.5* 6.9* 7.0*  HCT 23.9* 22.2* 22.3*  MCV 90.2 91.7  --   PLT 257 241  --

## 2021-12-22 NOTE — TOC Progression Note (Signed)
  Transition of Care (TOC) Screening Note   Patient Details  Name: Ana [name] Date of Birth: April 18, 1970   Transition of Care [facility]) CM/SW Contact:    Boneta Lucks, RN Phone Number: 12/22/2021, 3:09 PM  DC planning > 3 days, fluid overload, Living better with CHF book order to give to patient for review. TOC to follow for more CHF resources at Discharge.   Transition of Care Department [facility]) has reviewed patient and no TOC needs have been identified at this time. We will continue to monitor patient advancement through interdisciplinary progression rounds. If new patient transition needs arise, please place a TOC consult.      Barriers to Discharge: Continued Medical Work up  Expected Discharge Plan and [place]

## 2021-12-22 NOTE — Progress Notes (Signed)
PROGRESS NOTE    Ana [name]  LKJ:[id number] DOB: 12/02/1970 DOA: 12/21/2021 PCP: System, Provider Not In    Brief Narrative:  51 year old female with a history of chronic kidney disease stage V, chronically on diuretics, reports that she ran out of her medicine approximately a week ago.  She comes to the hospital with worsening edema, acute respiratory failure related to decompensated CHF.  She has been started on intravenous Lasix.  Nephrology is following case she needs to be initiated on dialysis.   Assessment & Plan:   Principal Problem:   Acute on chronic diastolic CHF (congestive heart failure) (HCC) Active Problems:   Acute respiratory failure with hypoxia (HCC)   Essential hypertension, benign   Hypothyroidism, adult   Type 2 diabetes mellitus with hyperlipidemia (HCC)   Class 3 obesity (HCC)   CKD (chronic kidney disease) stage 5, GFR less than 15 ml/min (HCC)   Acute respiratory failure with hypoxia -Secondary to pulmonary edema -Initially required BiPAP shortly after admission, she has since been weaned back down to nasal cannula -Continue to monitor for recurrence of respiratory distress, continue to wean down oxygen as tolerated   Acute on chronic diastolic congestive heart failure -Recent echocardiogram from 10/2021 showed normal EF with grade 2 diastolic dysfunction -Currently on intravenous Lasix, also received a dose of metolazone today -Continue on carvedilol -Not on ARB due to renal dysfunction   CKD stage V -Creatinine is near baseline -Can continue to monitor renal function with diuresis -Nephrology following in case she needs to be started on dialysis -Currently made n.p.o. in case she needs to have dialysis catheter placed tomorrow   Hypertension -Continue home dose of Norvasc, carvedilol, hydralazine   Hypothyroidism -Continue Synthroid   Diabetes type 2 -Diet controlled -Started on sliding scale insulin   Obesity, class III -BMI  40 -Discussed importance of healthy eating habits and physical activity  Anemia of chronic kidney disease -She is receiving IV iron -It to follow hemoglobin and transfuse for hemoglobin less than 7   DVT prophylaxis: heparin injection 5,000 Units Start: 12/21/21 1400  Code Status: Full code Family Communication: Discussed with family at the bedside Disposition Plan: Status is: Inpatient Remains inpatient appropriate because: Discharge once volume status has improved, continue diuresis, may need dialysis     Consultants:  Nephrology  Procedures:    Antimicrobials:      Subjective: Feels that her breathing is doing a little better today.  Reports fair urine output, although there was not significant urine output recorded.  Objective: Vitals:   12/22/21 1300 12/22/21 1400 12/22/21 1609 12/22/21 1900  BP: 136/62 (!) 119/45 (!) 134/59 (!) 136/44  Pulse: 71 68  72  Resp: (!) 25 (!) 23    Temp:      TempSrc:      SpO2: 100% 100%  100%  Weight:      Height:        Intake/Output Summary (Last 24 hours) at 12/22/2021 1941 Last data filed at 12/22/2021 1752 Gross per 24 hour  Intake 705.55 ml  Output 300 ml  Net 405.55 ml   Filed Weights   12/21/21 0720 12/21/21 1055 12/22/21 0500  Weight: 93 kg 94 kg 93.7 kg    Examination:  General exam: Appears calm and comfortable  Respiratory system: Bilateral crackles, respiratory effort normal. Cardiovascular system: S1 & S2 heard, RRR. No JVD, murmurs, rubs, gallops or clicks. Gastrointestinal system: Abdomen is nondistended, soft and nontender. No organomegaly or masses felt. Normal bowel sounds  heard. Central nervous system: Alert and oriented. No focal neurological deficits. Extremities: Lower extremity edema is present Skin: No rashes, lesions or ulcers Psychiatry: Judgement and insight appear normal. Mood & affect appropriate.     Data Reviewed: I have personally reviewed following labs and imaging  studies  CBC: Recent Labs  Lab 12/21/21 0719 12/22/21 0334 12/22/21 0515 12/22/21 1605  WBC 7.0 9.4  --  8.8  NEUTROABS 5.0  --   --   --   HGB 7.5* 6.9* 7.0* 7.2*  HCT 23.9* 22.2* 22.3* 22.5*  MCV 90.2 91.7  --  90.7  PLT 257 241  --  341   Basic Metabolic Panel: Recent Labs  Lab 12/21/21 0719 12/22/21 0335  NA 137 140  K 4.3 4.2  CL 108 109  CO2 19* 21*  GLUCOSE 243* 139*  BUN 63* 68*  CREATININE 5.82* 5.80*  CALCIUM 8.5* 8.4*  PHOS  --  5.6*   GFR: Estimated Creatinine Clearance: 11.7 mL/min (A) (by C-G formula based on SCr of 5.8 mg/dL (H)). Liver Function Tests: Recent Labs  Lab 12/21/21 0719 12/22/21 0335  AST 17  --   ALT 12  --   ALKPHOS 93  --   BILITOT 0.3  --   PROT 7.4  --   ALBUMIN 3.4* 3.2*   No results for input(s): "LIPASE", "AMYLASE" in the last 168 hours. No results for input(s): "AMMONIA" in the last 168 hours. Coagulation Profile: Recent Labs  Lab 12/22/21 0334  INR 1.0   Cardiac Enzymes: No results for input(s): "CKTOTAL", "CKMB", "CKMBINDEX", "TROPONINI" in the last 168 hours. BNP (last 3 results) No results for input(s): "PROBNP" in the last 8760 hours. HbA1C: Recent Labs    12/21/21 0719 12/22/21 0334  HGBA1C 8.0* 7.9*   CBG: Recent Labs  Lab 12/21/21 1640 12/21/21 2150 12/22/21 0733 12/22/21 1126 12/22/21 1611  GLUCAP 158* 151* 159* 197* 235*   Lipid Profile: No results for input(s): "CHOL", "HDL", "LDLCALC", "TRIG", "CHOLHDL", "LDLDIRECT" in the last 72 hours. Thyroid Function Tests: No results for input(s): "TSH", "T4TOTAL", "FREET4", "T3FREE", "THYROIDAB" in the last 72 hours. Anemia Panel: Recent Labs    12/21/21 1307  FERRITIN 135  TIBC 309  IRON 36   Sepsis Labs: No results for input(s): "PROCALCITON", "LATICACIDVEN" in the last 168 hours.  Recent Results (from the past 240 hour(s))  SARS Coronavirus 2 by RT PCR (hospital order, performed in [facility] hospital lab) *cepheid single result test*  Anterior Nasal Swab     Status: None   Collection Time: 12/21/21  7:20 AM   Specimen: Anterior Nasal Swab  Result Value Ref Range Status   SARS Coronavirus 2 by RT PCR NEGATIVE NEGATIVE Final    Comment: (NOTE) SARS-CoV-2 target nucleic acids are NOT DETECTED.  The SARS-CoV-2 RNA is generally detectable in upper and lower respiratory specimens during the acute phase of infection. The lowest concentration of SARS-CoV-2 viral copies this assay can detect is 250 copies / mL. A negative result does not preclude SARS-CoV-2 infection and should not be used as the sole basis for treatment or other patient management decisions.  A negative result may occur with improper specimen collection / handling, submission of specimen other than nasopharyngeal swab, presence of viral mutation(s) within the areas targeted by this assay, and inadequate number of viral copies (<250 copies / mL). A negative result must be combined with clinical observations, patient history, and epidemiological information.  Fact Sheet for Patients:   https://www.patel.info/  Fact Sheet for Healthcare Providers: https://hall.com/  This test is not yet approved or  cleared by the [place] FDA and has been authorized for detection and/or diagnosis of SARS-CoV-2 by FDA under an Emergency Use Authorization (EUA).  This EUA will remain in effect (meaning this test can be used) for the duration of the COVID-19 declaration under Section 564(b)(1) of the Act, 21 U.S.C. section 360bbb-3(b)(1), unless the authorization is terminated or revoked sooner.  Performed at [facility], [address] [place], [place] [address]   MRSA Next Gen by PCR, Nasal     Status: None   Collection Time: 12/21/21 10:55 AM   Specimen: Nasal Mucosa; Nasal Swab  Result Value Ref Range Status   MRSA by PCR Next Gen NOT DETECTED NOT DETECTED Final    Comment: (NOTE) The GeneXpert MRSA Assay (FDA  approved for NASAL specimens only), is one component of a comprehensive MRSA colonization surveillance program. It is not intended to diagnose MRSA infection nor to guide or monitor treatment for MRSA infections. Test performance is not FDA approved in patients less than 47 years old. Performed at [facility], [address], [place], [place] [address]          Radiology Studies: DG Chest Portable 1 View  Result Date: 12/21/2021 CLINICAL DATA:  Shortness of breath EXAM: PORTABLE CHEST 1 VIEW COMPARISON:  11/13/21 CXR FINDINGS: Low lung volumes. Enlarged cardiac and mediastinal contours, likely similar to prior exam when accounting for differences in lung volumes. Redemonstrated are hazy bilateral pulmonary opacities and prominent interstitial opacities, suggestive of pulmonary edema, similar to prior exam. There are more focal bibasilar pulmonary opacities which are nonspecific and may represent a combination of pleural effusion and atelectasis in the setting of low lung volumes and pulmonary edema. No displaced rib fractures. Degenerative changes of the bilateral AC joints. The upper abdomen is poorly visualized, but is without a radiographically discernible abnormality. IMPRESSION: 1. Cardiomegaly and pulmonary edema. 2. More focal bibasilar pulmonary opacities are nonspecific and may represent a combination of pleural effusion and atelectasis in the setting of low lung volumes and pulmonary edema. Electronically Signed   By: Marin Roberts M.D.   On: 12/21/2021 08:04        Scheduled Meds:  amLODipine  10 mg Oral Daily   carvedilol  25 mg Oral BID   Chlorhexidine Gluconate Cloth  6 each Topical Daily   heparin  5,000 Units Subcutaneous Q8H   hydrALAZINE  75 mg Oral TID   insulin aspart  0-15 Units Subcutaneous TID WC   insulin aspart  0-5 Units Subcutaneous QHS   levothyroxine  50 mcg Oral Daily   [START ON 12/23/2021] metolazone  5 mg Oral Daily   rosuvastatin  40 mg Oral Q supper    sodium bicarbonate  650 mg Oral QID   Continuous Infusions:  ferric gluconate (FERRLECIT) IVPB Stopped (12/22/21 1606)   furosemide Stopped (12/22/21 1717)     LOS: 1 day    Time spent: 35 minutes    Kathie Dike, MD [facility]   If 7PM-7AM, please contact night-coverage [email or web address]  12/22/2021, 7:41 PM

## 2021-12-23 DIAGNOSIS — J9601 Acute respiratory failure with hypoxia: Secondary | ICD-10-CM | POA: Diagnosis not present

## 2021-12-23 DIAGNOSIS — I5033 Acute on chronic diastolic (congestive) heart failure: Secondary | ICD-10-CM | POA: Diagnosis not present

## 2021-12-23 DIAGNOSIS — N185 Chronic kidney disease, stage 5: Secondary | ICD-10-CM | POA: Diagnosis not present

## 2021-12-23 LAB — RENAL FUNCTION PANEL
Albumin: 3.4 g/dL — ABNORMAL LOW (ref 3.5–5.0)
Anion gap: 12 (ref 5–15)
BUN: 69 mg/dL — ABNORMAL HIGH (ref 6–20)
CO2: 20 mmol/L — ABNORMAL LOW (ref 22–32)
Calcium: 8.5 mg/dL — ABNORMAL LOW (ref 8.9–10.3)
Chloride: 106 mmol/L (ref 98–111)
Creatinine, Ser: 6.38 mg/dL — ABNORMAL HIGH (ref 0.44–1.00)
GFR, Estimated: 7 mL/min — ABNORMAL LOW (ref >60)
Glucose, Bld: 175 mg/dL — ABNORMAL HIGH (ref 70–99)
Phosphorus: 5.6 mg/dL — ABNORMAL HIGH (ref 2.5–4.6)
Potassium: 4.4 mmol/L (ref 3.5–5.1)
Sodium: 138 mmol/L (ref 135–145)

## 2021-12-23 LAB — CBC
HCT: 23.2 % — ABNORMAL LOW (ref 36.0–46.0)
Hemoglobin: 7.3 g/dL — ABNORMAL LOW (ref 12.0–15.0)
MCH: 28.9 pg (ref 26.0–34.0)
MCHC: 31.5 g/dL (ref 30.0–36.0)
MCV: 91.7 fL (ref 80.0–100.0)
Platelets: 243 10*3/uL (ref 150–400)
RBC: 2.53 MIL/uL — ABNORMAL LOW (ref 3.87–5.11)
RDW: 14.6 % (ref 11.5–15.5)
WBC: 9.1 10*3/uL (ref 4.0–10.5)
nRBC: 0 % (ref 0.0–0.2)

## 2021-12-23 LAB — GLUCOSE, CAPILLARY
Glucose-Capillary: 162 mg/dL — ABNORMAL HIGH (ref 70–99)
Glucose-Capillary: 113 mg/dL — ABNORMAL HIGH (ref 70–99)
Glucose-Capillary: 282 mg/dL — ABNORMAL HIGH (ref 70–99)
Glucose-Capillary: 83 mg/dL (ref 70–99)
Glucose-Capillary: 120 mg/dL — ABNORMAL HIGH (ref 70–99)

## 2021-12-23 NOTE — Progress Notes (Signed)
Nephrology Follow-Up Consult note   Assessment/Recommendations: Ana [name] is a/an 51 y.o. female with a past medical history significant for DM2, HTN, CHF, CKD, admitted for volume overload.        AKI/CKD stage V -progressive CKD with baseline around 6 at this time.  She has been pursuing dialysis in the outpatient setting with her nephrologist Dr. Theador Hawthorne but does not currently have access.  She was admitted for volume overload likely related to heart failure in combination with advanced kidney disease.  Fortunately does not have uremic symptoms and seems to be responding to medical therapy Volume overload improving No plans for dialysis catheter today but monitor closely for need Continue to monitor creatinine and urine output closely Continue IV diuretics with IV Lasix 160 mg 3 times daily.  Added on metolazone 5 mg daily If creatinine significantly worse tomorrow consider decreasing diuretic regimen Weight daily No uremic symptoms  Avoid nephrotoxic medications including NSAIDs and iodinated intravenous contrast exposure unless the latter is absolutely indicated.  Preferred narcotic agents for pain control are hydromorphone, fentanyl, and methadone. Morphine should not be used. Avoid Baclofen and avoid oral sodium phosphate and magnesium citrate based laxatives / bowel preps. Continue strict Input and Output monitoring. Will monitor the patient closely with you and intervene or adjust therapy as indicated by changes in clinical status/labs  Acute hypoxic respiratory failure - due to progressive CKD and volume overload.  Continue with diuresis as above Acute on chronic diastolic CHF-possibly responding.  Continue diuresis as above Anemia of CKD stage V -receiving IV iron. Hold ESA until CHF improves some more.  Transfuse prn. Vascular access - will need AVF/AVG placed +/- dialysis catheter pending her response to IV lasix. Given her improvement will hold on dialysis for the weekend. DM  type 2 - per primary HTN - stable.  Taken off ARB 3 weeks ago due to worsening renal function.    Recommendations conveyed to primary service.    [facility] 12/23/2021 11:30 AM  ___________________________________________________________  CC: volume overload, SOB  Interval History/Subjective: Patient states she continues to feel better and better.  Her creatinine was slightly higher at 6.4 from 5.8.  1 L of urine output documented but multiple unmeasured urines.  The patient feels as though her edema is much improved and her shortness of breath continues to improve.  She specifically denies nausea, vomiting, chest pain.   Medications:  Current Facility-Administered Medications  Medication Dose Route Frequency Provider Last Rate Last Admin   acetaminophen (TYLENOL) tablet 650 mg  650 mg Oral Q6H PRN Kathie Dike, MD       Or   acetaminophen (TYLENOL) suppository 650 mg  650 mg Rectal Q6H PRN Kathie Dike, MD       albuterol (PROVENTIL) (2.5 MG/3ML) 0.083% nebulizer solution 2.5 mg  2.5 mg Nebulization Q2H PRN Kathie Dike, MD       amLODipine (NORVASC) tablet 10 mg  10 mg Oral Daily Memon, Jolaine Artist, MD   10 mg at 12/22/21 1028   carvedilol (COREG) tablet 25 mg  25 mg Oral BID Kathie Dike, MD   25 mg at 12/22/21 2146   Chlorhexidine Gluconate Cloth 2 % PADS 6 each  6 each Topical Daily Kathie Dike, MD   6 each at 12/23/21 0819   ferric gluconate (FERRLECIT) 250 mg in sodium chloride 0.9 % 250 mL IVPB  250 mg Intravenous Daily Reesa Chew, MD 135 mL/hr at 12/23/21 0947 250 mg at 12/23/21  0938   furosemide (LASIX) 160 mg in dextrose 5 % 50 mL IVPB  160 mg Intravenous TID Donato Heinz, MD 66 mL/hr at 12/23/21 0818 160 mg at 12/23/21 0818   heparin injection 5,000 Units  5,000 Units Subcutaneous Q8H Kathie Dike, MD   5,000 Units at 12/23/21 0510   hydrALAZINE (APRESOLINE) tablet 75 mg  75 mg Oral TID Kathie Dike, MD   75  mg at 12/22/21 2146   insulin aspart (novoLOG) injection 0-15 Units  0-15 Units Subcutaneous TID WC Kathie Dike, MD   3 Units at 12/23/21 0815   insulin aspart (novoLOG) injection 0-5 Units  0-5 Units Subcutaneous QHS Kathie Dike, MD       levothyroxine (SYNTHROID) tablet 50 mcg  50 mcg Oral Daily Kathie Dike, MD   50 mcg at 12/22/21 1829   metolazone (ZAROXOLYN) tablet 5 mg  5 mg Oral Daily Reesa Chew, MD       ondansetron [facility]) tablet 4 mg  4 mg Oral Q6H PRN Kathie Dike, MD       Or   ondansetron (ZOFRAN) injection 4 mg  4 mg Intravenous Q6H PRN Kathie Dike, MD       Oral care mouth rinse  15 mL Mouth Rinse PRN Kathie Dike, MD       rosuvastatin (CRESTOR) tablet 40 mg  40 mg Oral Q supper Kathie Dike, MD   40 mg at 12/22/21 1609   sodium bicarbonate tablet 650 mg  650 mg Oral QID Kathie Dike, MD   650 mg at 12/22/21 2146      Review of Systems: 10 systems reviewed and negative except per interval history/subjective  Physical Exam: Vitals:   12/23/21 0800 12/23/21 0900  BP: (!) 143/53 (!) 138/40  Pulse: 79 78  Resp: 14 16  Temp:    SpO2: 100% 100%   No intake/output data recorded.  Intake/Output Summary (Last 24 hours) at 12/23/2021 1130 Last data filed at 12/23/2021 0038 Gross per 24 hour  Intake 580.2 ml  Output 700 ml  Net -119.8 ml   Constitutional: well-appearing, no acute distress ENMT: ears and nose without scars or lesions, MMM CV: normal rate, 1+ edema at the bilateral ankles Respiratory: Minimal crackles in the bilateral bases, no increased work of breathing Gastrointestinal: soft, non-tender, no palpable masses or hernias Skin: no visible lesions or rashes Psych: alert, judgement/insight appropriate, appropriate mood and affect   Test Results I personally reviewed new and old clinical labs and radiology tests Lab Results  Component Value Date   NA 138 12/23/2021   K 4.4 12/23/2021   CL 106 12/23/2021   CO2 20 (L)  12/23/2021   BUN 69 (H) 12/23/2021   CREATININE 6.38 (H) 12/23/2021   CALCIUM 8.5 (L) 12/23/2021   ALBUMIN 3.4 (L) 12/23/2021   PHOS 5.6 (H) 12/23/2021    CBC Recent Labs  Lab 12/21/21 0719 12/22/21 0334 12/22/21 0515 12/22/21 1605 12/23/21 0504  WBC 7.0 9.4  --  8.8 9.1  NEUTROABS 5.0  --   --   --   --   HGB 7.5* 6.9* 7.0* 7.2* 7.3*  HCT 23.9* 22.2* 22.3* 22.5* 23.2*  MCV 90.2 91.7  --  90.7 91.7  PLT 257 241  --  235 243

## 2021-12-23 NOTE — Progress Notes (Signed)
PROGRESS NOTE    Ana [name]  NKN:[id number] DOB: Mar 09, 1970 DOA: 12/21/2021 PCP: System, Provider Not In    Brief Narrative:  51 year old female with a history of chronic kidney disease stage V, chronically on diuretics, reports that she ran out of her medicine approximately a week ago.  She comes to the hospital with worsening edema, acute respiratory failure related to decompensated CHF.  She has been started on intravenous Lasix.  Nephrology is following case she needs to be initiated on dialysis.   Assessment & Plan:   Principal Problem:   Acute on chronic diastolic CHF (congestive heart failure) (HCC) Active Problems:   Acute respiratory failure with hypoxia (HCC)   Essential hypertension, benign   Hypothyroidism, adult   Type 2 diabetes mellitus with hyperlipidemia (HCC)   Class 3 obesity (HCC)   CKD (chronic kidney disease) stage 5, GFR less than 15 ml/min (HCC)   Acute respiratory failure with hypoxia -Secondary to pulmonary edema -Initially required BiPAP shortly after admission, she has since been weaned back down to nasal cannula -Continue to monitor for recurrence of respiratory distress, continue to wean down oxygen as tolerated -Weaning oxygen off as tolerated   Acute on chronic diastolic congestive heart failure -Recent echocardiogram from 10/2021 showed normal EF with grade 2 diastolic dysfunction -Currently on intravenous Lasix, and daily metolazone -Continue on carvedilol -Not on ARB due to renal dysfunction -Consider reducing diuretics tomorrow if creatinine continues to trend up   CKD stage V -Creatinine is near baseline -Can continue to monitor renal function with diuresis -Nephrology following in case she needs to be started on dialysis    Hypertension -Continue home dose of Norvasc, carvedilol, hydralazine   Hypothyroidism -Continue Synthroid   Diabetes type 2 -Diet controlled -Started on sliding scale insulin -Blood sugars currently  stable   Obesity, class III -BMI 40 -Discussed importance of healthy eating habits and physical activity  Anemia of chronic kidney disease -She is receiving IV iron -It to follow hemoglobin and transfuse for hemoglobin less than 7   DVT prophylaxis: heparin injection 5,000 Units Start: 12/21/21 1400  Code Status: Full code Family Communication: Discussed with patient Disposition Plan: Status is: Inpatient Remains inpatient appropriate because: Discharge once volume status has improved, continue diuresis, may need dialysis     Consultants:  Nephrology  Procedures:    Antimicrobials:      Subjective: She feels that her breathing is improving.  Reports good urine output.  She has not required BiPAP overnight or during the day yesterday.  Objective: Vitals:   12/23/21 1600 12/23/21 1627 12/23/21 1700 12/23/21 1800  BP: (!) 157/59  (!) 153/56 (!) 153/56  Pulse: 77  79 81  Resp: (!) 27  (!) 28 (!) 25  Temp:  98.8 F (37.1 C)    TempSrc:  Oral    SpO2: 95%  96% 98%  Weight:      Height:        Intake/Output Summary (Last 24 hours) at 12/23/2021 1911 Last data filed at 12/23/2021 1815 Gross per 24 hour  Intake 656.79 ml  Output 1350 ml  Net -693.21 ml   Filed Weights   12/21/21 0720 12/21/21 1055 12/22/21 0500  Weight: 93 kg 94 kg 93.7 kg    Examination:  General exam: Appears calm and comfortable  Respiratory system: Clear bilaterally, respiratory effort normal. Cardiovascular system: S1 & S2 heard, RRR. No JVD, murmurs, rubs, gallops or clicks. Gastrointestinal system: Abdomen is nondistended, soft and nontender. No organomegaly or  masses felt. Normal bowel sounds heard. Central nervous system: Alert and oriented. No focal neurological deficits. Extremities: Lower extremity edema improved Skin: No rashes, lesions or ulcers Psychiatry: Judgement and insight appear normal. Mood & affect appropriate.     Data Reviewed: I have personally reviewed  following labs and imaging studies  CBC: Recent Labs  Lab 12/21/21 0719 12/22/21 0334 12/22/21 0515 12/22/21 1605 12/23/21 0504  WBC 7.0 9.4  --  8.8 9.1  NEUTROABS 5.0  --   --   --   --   HGB 7.5* 6.9* 7.0* 7.2* 7.3*  HCT 23.9* 22.2* 22.3* 22.5* 23.2*  MCV 90.2 91.7  --  90.7 91.7  PLT 257 241  --  235 967   Basic Metabolic Panel: Recent Labs  Lab 12/21/21 0719 12/22/21 0335 12/23/21 0504  NA 137 140 138  K 4.3 4.2 4.4  CL 108 109 106  CO2 19* 21* 20*  GLUCOSE 243* 139* 175*  BUN 63* 68* 69*  CREATININE 5.82* 5.80* 6.38*  CALCIUM 8.5* 8.4* 8.5*  PHOS  --  5.6* 5.6*   GFR: Estimated Creatinine Clearance: 10.7 mL/min (A) (by C-G formula based on SCr of 6.38 mg/dL (H)). Liver Function Tests: Recent Labs  Lab 12/21/21 0719 12/22/21 0335 12/23/21 0504  AST 17  --   --   ALT 12  --   --   ALKPHOS 93  --   --   BILITOT 0.3  --   --   PROT 7.4  --   --   ALBUMIN 3.4* 3.2* 3.4*   No results for input(s): "LIPASE", "AMYLASE" in the last 168 hours. No results for input(s): "AMMONIA" in the last 168 hours. Coagulation Profile: Recent Labs  Lab 12/22/21 0334  INR 1.0   Cardiac Enzymes: No results for input(s): "CKTOTAL", "CKMB", "CKMBINDEX", "TROPONINI" in the last 168 hours. BNP (last 3 results) No results for input(s): "PROBNP" in the last 8760 hours. HbA1C: Recent Labs    12/21/21 0719 12/22/21 0334  HGBA1C 8.0* 7.9*   CBG: Recent Labs  Lab 12/22/21 1611 12/22/21 2248 12/23/21 0757 12/23/21 1146 12/23/21 1620  GLUCAP 235* 153* 162* 113* 282*   Lipid Profile: No results for input(s): "CHOL", "HDL", "LDLCALC", "TRIG", "CHOLHDL", "LDLDIRECT" in the last 72 hours. Thyroid Function Tests: No results for input(s): "TSH", "T4TOTAL", "FREET4", "T3FREE", "THYROIDAB" in the last 72 hours. Anemia Panel: Recent Labs    12/21/21 1307  FERRITIN 135  TIBC 309  IRON 36   Sepsis Labs: No results for input(s): "PROCALCITON", "LATICACIDVEN" in the last  168 hours.  Recent Results (from the past 240 hour(s))  SARS Coronavirus 2 by RT PCR (hospital order, performed in [facility] hospital lab) *cepheid single result test* Anterior Nasal Swab     Status: None   Collection Time: 12/21/21  7:20 AM   Specimen: Anterior Nasal Swab  Result Value Ref Range Status   SARS Coronavirus 2 by RT PCR NEGATIVE NEGATIVE Final    Comment: (NOTE) SARS-CoV-2 target nucleic acids are NOT DETECTED.  The SARS-CoV-2 RNA is generally detectable in upper and lower respiratory specimens during the acute phase of infection. The lowest concentration of SARS-CoV-2 viral copies this assay can detect is 250 copies / mL. A negative result does not preclude SARS-CoV-2 infection and should not be used as the sole basis for treatment or other patient management decisions.  A negative result may occur with improper specimen collection / handling, submission of specimen other than nasopharyngeal swab, presence  of viral mutation(s) within the areas targeted by this assay, and inadequate number of viral copies (<250 copies / mL). A negative result must be combined with clinical observations, patient history, and epidemiological information.  Fact Sheet for Patients:   https://www.patel.info/  Fact Sheet for Healthcare Providers: https://hall.com/  This test is not yet approved or  cleared by the [place] FDA and has been authorized for detection and/or diagnosis of SARS-CoV-2 by FDA under an Emergency Use Authorization (EUA).  This EUA will remain in effect (meaning this test can be used) for the duration of the COVID-19 declaration under Section 564(b)(1) of the Act, 21 U.S.C. section 360bbb-3(b)(1), unless the authorization is terminated or revoked sooner.  Performed at [facility], [address], [place], [place] [address]   MRSA Next Gen by PCR, Nasal     Status: None   Collection Time: 12/21/21 10:55 AM    Specimen: Nasal Mucosa; Nasal Swab  Result Value Ref Range Status   MRSA by PCR Next Gen NOT DETECTED NOT DETECTED Final    Comment: (NOTE) The GeneXpert MRSA Assay (FDA approved for NASAL specimens only), is one component of a comprehensive MRSA colonization surveillance program. It is not intended to diagnose MRSA infection nor to guide or monitor treatment for MRSA infections. Test performance is not FDA approved in patients less than 73 years old. Performed at [facility], [address], [place], [place] [address]          Radiology Studies: No results found.      Scheduled Meds:  amLODipine  10 mg Oral Daily   carvedilol  25 mg Oral BID   Chlorhexidine Gluconate Cloth  6 each Topical Daily   heparin  5,000 Units Subcutaneous Q8H   hydrALAZINE  75 mg Oral TID   insulin aspart  0-15 Units Subcutaneous TID WC   insulin aspart  0-5 Units Subcutaneous QHS   levothyroxine  50 mcg Oral Daily   metolazone  5 mg Oral Daily   rosuvastatin  40 mg Oral Q supper   sodium bicarbonate  650 mg Oral QID   Continuous Infusions:  ferric gluconate (FERRLECIT) IVPB Stopped (12/23/21 1149)   furosemide Stopped (12/23/21 1637)     LOS: 2 days    Time spent: 35 minutes    Kathie Dike, MD [facility]   If 7PM-7AM, please contact night-coverage [email or web address]  12/23/2021, 7:11 PM

## 2021-12-24 DIAGNOSIS — N185 Chronic kidney disease, stage 5: Secondary | ICD-10-CM | POA: Diagnosis not present

## 2021-12-24 DIAGNOSIS — I5033 Acute on chronic diastolic (congestive) heart failure: Secondary | ICD-10-CM | POA: Diagnosis not present

## 2021-12-24 DIAGNOSIS — J9601 Acute respiratory failure with hypoxia: Secondary | ICD-10-CM | POA: Diagnosis not present

## 2021-12-24 LAB — CBC
HCT: 22.8 % — ABNORMAL LOW (ref 36.0–46.0)
Hemoglobin: 7.3 g/dL — ABNORMAL LOW (ref 12.0–15.0)
MCH: 28.6 pg (ref 26.0–34.0)
MCHC: 32 g/dL (ref 30.0–36.0)
MCV: 89.4 fL (ref 80.0–100.0)
Platelets: 245 10*3/uL (ref 150–400)
RBC: 2.55 MIL/uL — ABNORMAL LOW (ref 3.87–5.11)
RDW: 14.6 % (ref 11.5–15.5)
WBC: 9.3 10*3/uL (ref 4.0–10.5)
nRBC: 0 % (ref 0.0–0.2)

## 2021-12-24 LAB — GLUCOSE, CAPILLARY
Glucose-Capillary: 144 mg/dL — ABNORMAL HIGH (ref 70–99)
Glucose-Capillary: 180 mg/dL — ABNORMAL HIGH (ref 70–99)
Glucose-Capillary: 176 mg/dL — ABNORMAL HIGH (ref 70–99)
Glucose-Capillary: 159 mg/dL — ABNORMAL HIGH (ref 70–99)

## 2021-12-24 LAB — RENAL FUNCTION PANEL
Albumin: 3.3 g/dL — ABNORMAL LOW (ref 3.5–5.0)
Anion gap: 10 (ref 5–15)
BUN: 70 mg/dL — ABNORMAL HIGH (ref 6–20)
CO2: 23 mmol/L (ref 22–32)
Calcium: 8.7 mg/dL — ABNORMAL LOW (ref 8.9–10.3)
Chloride: 105 mmol/L (ref 98–111)
Creatinine, Ser: 6.16 mg/dL — ABNORMAL HIGH (ref 0.44–1.00)
GFR, Estimated: 8 mL/min — ABNORMAL LOW (ref >60)
Glucose, Bld: 171 mg/dL — ABNORMAL HIGH (ref 70–99)
Phosphorus: 5.1 mg/dL — ABNORMAL HIGH (ref 2.5–4.6)
Potassium: 4 mmol/L (ref 3.5–5.1)
Sodium: 138 mmol/L (ref 135–145)

## 2021-12-24 MED ORDER — FUROSEMIDE 10 MG/ML IJ SOLN
120.0000 mg | Freq: Two times a day (BID) | INTRAVENOUS | Status: DC
  Administered 2021-12-25: 120 mg via INTRAVENOUS
  Filled 2021-12-24 (×3): qty 12

## 2021-12-24 NOTE — Progress Notes (Addendum)
PROGRESS NOTE    Ana [name]  LOV:[id number] DOB: 17-Oct-1970 DOA: 12/21/2021 PCP: System, Provider Not In    Brief Narrative:  51 year old female with a history of chronic kidney disease stage V, chronically on diuretics, reports that she ran out of her medicine approximately a week ago.  She comes to the hospital with worsening edema, acute respiratory failure related to decompensated CHF.  She has been started on intravenous Lasix.  Nephrology is following case she needs to be initiated on dialysis.   Assessment & Plan:   Principal Problem:   Acute on chronic diastolic CHF (congestive heart failure) (HCC) Active Problems:   Acute respiratory failure with hypoxia (HCC)   Essential hypertension, benign   Hypothyroidism, adult   Type 2 diabetes mellitus with hyperlipidemia (HCC)   Class 3 obesity (HCC)   CKD (chronic kidney disease) stage 5, GFR less than 15 ml/min (HCC)   Acute respiratory failure with hypoxia -Secondary to pulmonary edema -Initially required BiPAP shortly after admission, she has since been weaned back down to nasal cannula -Continue to monitor for recurrence of respiratory distress, continue to wean down oxygen as tolerated -Weaning oxygen off as tolerated   Acute on chronic diastolic congestive heart failure -Recent echocardiogram from 10/2021 showed normal EF with grade 2 diastolic dysfunction -Currently on intravenous Lasix, and daily metolazone -Continue on carvedilol -Not on ARB due to renal dysfunction -Overall volume status appears to be improving, anticipate transitioning to oral diuretics tomorrow   CKD stage V -Creatinine is near baseline -Can continue to monitor renal function with diuresis -Nephrology following in case she needs to be started on dialysis    Hypertension -Continue home dose of Norvasc, carvedilol, hydralazine   Hypothyroidism -Continue Synthroid   Diabetes type 2 with hyperglycemia -Diet controlled -Started on  sliding scale insulin -Blood sugars currently stable   Obesity, class III -BMI 40 -Discussed importance of healthy eating habits and physical activity  Anemia of chronic kidney disease -She is receiving IV iron -It to follow hemoglobin and transfuse for hemoglobin less than 7   DVT prophylaxis: heparin injection 5,000 Units Start: 12/21/21 1400  Code Status: Full code Family Communication: Discussed with patient Disposition Plan: Status is: Inpatient Remains inpatient appropriate because: Discharge once volume status has improved, continue diuresis, anticipate transitioning to p.o. diuretics tomorrow     Consultants:  Nephrology  Procedures:    Antimicrobials:      Subjective: She is feeling better today.  She is able to ambulate on room air without shortness of breath.  Objective: Vitals:   12/24/21 1555 12/24/21 1600 12/24/21 1700 12/24/21 1800  BP: (!) 169/74 (!) 161/66 (!) 155/69 (!) 156/65  Pulse: 75 73 74 78  Resp: 12 18 17    Temp: 98.6 F (37 C)     TempSrc: Oral     SpO2: 97% 100% 100% 99%  Weight:      Height:        Intake/Output Summary (Last 24 hours) at 12/24/2021 1855 Last data filed at 12/24/2021 1637 Gross per 24 hour  Intake --  Output 800 ml  Net -800 ml   Filed Weights   12/21/21 1055 12/22/21 0500 12/24/21 0533  Weight: 94 kg 93.7 kg 94 kg    Examination:  General exam: Appears calm and comfortable  Respiratory system: Clear bilaterally, respiratory effort normal. Cardiovascular system: S1 & S2 heard, RRR. No JVD, murmurs, rubs, gallops or clicks. Gastrointestinal system: Abdomen is nondistended, soft and nontender. No organomegaly or masses  felt. Normal bowel sounds heard. Central nervous system: Alert and oriented. No focal neurological deficits. Extremities: Lower extremity edema improved Skin: No rashes, lesions or ulcers Psychiatry: Judgement and insight appear normal. Mood & affect appropriate.     Data Reviewed: I  have personally reviewed following labs and imaging studies  CBC: Recent Labs  Lab 12/21/21 0719 12/22/21 0334 12/22/21 0515 12/22/21 1605 12/23/21 0504 12/24/21 0341  WBC 7.0 9.4  --  8.8 9.1 9.3  NEUTROABS 5.0  --   --   --   --   --   HGB 7.5* 6.9* 7.0* 7.2* 7.3* 7.3*  HCT 23.9* 22.2* 22.3* 22.5* 23.2* 22.8*  MCV 90.2 91.7  --  90.7 91.7 89.4  PLT 257 241  --  235 243 540   Basic Metabolic Panel: Recent Labs  Lab 12/21/21 0719 12/22/21 0335 12/23/21 0504 12/24/21 0340  NA 137 140 138 138  K 4.3 4.2 4.4 4.0  CL 108 109 106 105  CO2 19* 21* 20* 23  GLUCOSE 243* 139* 175* 171*  BUN 63* 68* 69* 70*  CREATININE 5.82* 5.80* 6.38* 6.16*  CALCIUM 8.5* 8.4* 8.5* 8.7*  PHOS  --  5.6* 5.6* 5.1*   GFR: Estimated Creatinine Clearance: 11.1 mL/min (A) (by C-G formula based on SCr of 6.16 mg/dL (H)). Liver Function Tests: Recent Labs  Lab 12/21/21 0719 12/22/21 0335 12/23/21 0504 12/24/21 0340  AST 17  --   --   --   ALT 12  --   --   --   ALKPHOS 93  --   --   --   BILITOT 0.3  --   --   --   PROT 7.4  --   --   --   ALBUMIN 3.4* 3.2* 3.4* 3.3*   No results for input(s): "LIPASE", "AMYLASE" in the last 168 hours. No results for input(s): "AMMONIA" in the last 168 hours. Coagulation Profile: Recent Labs  Lab 12/22/21 0334  INR 1.0   Cardiac Enzymes: No results for input(s): "CKTOTAL", "CKMB", "CKMBINDEX", "TROPONINI" in the last 168 hours. BNP (last 3 results) No results for input(s): "PROBNP" in the last 8760 hours. HbA1C: Recent Labs    12/22/21 0334  HGBA1C 7.9*   CBG: Recent Labs  Lab 12/23/21 2058 12/23/21 2219 12/24/21 0808 12/24/21 1154 12/24/21 1623  GLUCAP 83 120* 144* 180* 176*   Lipid Profile: No results for input(s): "CHOL", "HDL", "LDLCALC", "TRIG", "CHOLHDL", "LDLDIRECT" in the last 72 hours. Thyroid Function Tests: No results for input(s): "TSH", "T4TOTAL", "FREET4", "T3FREE", "THYROIDAB" in the last 72 hours. Anemia Panel: No  results for input(s): "VITAMINB12", "FOLATE", "FERRITIN", "TIBC", "IRON", "RETICCTPCT" in the last 72 hours.  Sepsis Labs: No results for input(s): "PROCALCITON", "LATICACIDVEN" in the last 168 hours.  Recent Results (from the past 240 hour(s))  SARS Coronavirus 2 by RT PCR (hospital order, performed in [facility] hospital lab) *cepheid single result test* Anterior Nasal Swab     Status: None   Collection Time: 12/21/21  7:20 AM   Specimen: Anterior Nasal Swab  Result Value Ref Range Status   SARS Coronavirus 2 by RT PCR NEGATIVE NEGATIVE Final    Comment: (NOTE) SARS-CoV-2 target nucleic acids are NOT DETECTED.  The SARS-CoV-2 RNA is generally detectable in upper and lower respiratory specimens during the acute phase of infection. The lowest concentration of SARS-CoV-2 viral copies this assay can detect is 250 copies / mL. A negative result does not preclude SARS-CoV-2 infection and should not  be used as the sole basis for treatment or other patient management decisions.  A negative result may occur with improper specimen collection / handling, submission of specimen other than nasopharyngeal swab, presence of viral mutation(s) within the areas targeted by this assay, and inadequate number of viral copies (<250 copies / mL). A negative result must be combined with clinical observations, patient history, and epidemiological information.  Fact Sheet for Patients:   https://www.patel.info/  Fact Sheet for Healthcare Providers: https://hall.com/  This test is not yet approved or  cleared by the [place] FDA and has been authorized for detection and/or diagnosis of SARS-CoV-2 by FDA under an Emergency Use Authorization (EUA).  This EUA will remain in effect (meaning this test can be used) for the duration of the COVID-19 declaration under Section 564(b)(1) of the Act, 21 U.S.C. section 360bbb-3(b)(1), unless the authorization is  terminated or revoked sooner.  Performed at [facility], [address], [place], [place] [address]   MRSA Next Gen by PCR, Nasal     Status: None   Collection Time: 12/21/21 10:55 AM   Specimen: Nasal Mucosa; Nasal Swab  Result Value Ref Range Status   MRSA by PCR Next Gen NOT DETECTED NOT DETECTED Final    Comment: (NOTE) The GeneXpert MRSA Assay (FDA approved for NASAL specimens only), is one component of a comprehensive MRSA colonization surveillance program. It is not intended to diagnose MRSA infection nor to guide or monitor treatment for MRSA infections. Test performance is not FDA approved in patients less than 48 years old. Performed at [facility], [address], [place], [place] [address]          Radiology Studies: No results found.      Scheduled Meds:  amLODipine  10 mg Oral Daily   carvedilol  25 mg Oral BID   Chlorhexidine Gluconate Cloth  6 each Topical Daily   heparin  5,000 Units Subcutaneous Q8H   hydrALAZINE  75 mg Oral TID   insulin aspart  0-15 Units Subcutaneous TID WC   insulin aspart  0-5 Units Subcutaneous QHS   levothyroxine  50 mcg Oral Daily   metolazone  5 mg Oral Daily   rosuvastatin  40 mg Oral Q supper   sodium bicarbonate  650 mg Oral QID   Continuous Infusions:  ferric gluconate (FERRLECIT) IVPB 250 mg (12/24/21 1011)   [START ON 12/25/2021] furosemide       LOS: 3 days    Time spent: 35 minutes    Kathie Dike, MD [facility]   If 7PM-7AM, please contact night-coverage [email or web address]  12/24/2021, 6:55 PM

## 2021-12-24 NOTE — Progress Notes (Signed)
Patient ambulated 59ft with standby assist, room air O2 98% while walking

## 2021-12-24 NOTE — Plan of Care (Signed)
  Problem: Education: Goal: Ability to verbalize understanding of medication therapies will improve Outcome: Progressing   Problem: Activity: Goal: Capacity to carry out activities will improve Outcome: Progressing   Problem: Education: Goal: Ability to describe self-care measures that may prevent or decrease complications (Diabetes Survival Skills Education) will improve Outcome: Progressing   Problem: Education: Goal: Knowledge of General Education information will improve Description: Including pain rating scale, medication(s)/side effects and non-pharmacologic comfort measures Outcome: Progressing

## 2021-12-24 NOTE — Progress Notes (Signed)
Bipap is PRN order.  Not needed at this time. 

## 2021-12-25 DIAGNOSIS — I509 Heart failure, unspecified: Secondary | ICD-10-CM

## 2021-12-25 DIAGNOSIS — N185 Chronic kidney disease, stage 5: Secondary | ICD-10-CM | POA: Diagnosis not present

## 2021-12-25 DIAGNOSIS — I5033 Acute on chronic diastolic (congestive) heart failure: Secondary | ICD-10-CM | POA: Diagnosis not present

## 2021-12-25 LAB — RENAL FUNCTION PANEL
Albumin: 3.2 g/dL — ABNORMAL LOW (ref 3.5–5.0)
Anion gap: 10 (ref 5–15)
BUN: 64 mg/dL — ABNORMAL HIGH (ref 6–20)
CO2: 24 mmol/L (ref 22–32)
Calcium: 8.6 mg/dL — ABNORMAL LOW (ref 8.9–10.3)
Chloride: 102 mmol/L (ref 98–111)
Creatinine, Ser: 5.81 mg/dL — ABNORMAL HIGH (ref 0.44–1.00)
GFR, Estimated: 8 mL/min — ABNORMAL LOW (ref >60)
Glucose, Bld: 158 mg/dL — ABNORMAL HIGH (ref 70–99)
Phosphorus: 4.9 mg/dL — ABNORMAL HIGH (ref 2.5–4.6)
Potassium: 3.7 mmol/L (ref 3.5–5.1)
Sodium: 136 mmol/L (ref 135–145)

## 2021-12-25 LAB — GLUCOSE, CAPILLARY
Glucose-Capillary: 164 mg/dL — ABNORMAL HIGH (ref 70–99)
Glucose-Capillary: 201 mg/dL — ABNORMAL HIGH (ref 70–99)

## 2021-12-25 MED ORDER — AMLODIPINE BESYLATE 10 MG PO TABS: ORAL_TABLET | ORAL | 3 refills | Status: AC

## 2021-12-25 MED ORDER — LEVOTHYROXINE SODIUM 50 MCG PO TABS: ORAL_TABLET | ORAL | 3 refills | Status: DC

## 2021-12-25 MED ORDER — FUROSEMIDE 10 MG/ML IJ SOLN
INTRAMUSCULAR | Status: AC
  Filled 2021-12-25: qty 20

## 2021-12-25 MED ORDER — CARVEDILOL 25 MG PO TABS: 25.0000 mg | ORAL_TABLET | Freq: Two times a day (BID) | ORAL | 0 refills | Status: DC

## 2021-12-25 MED ORDER — HYDRALAZINE HCL 50 MG PO TABS: 75.0000 mg | ORAL_TABLET | Freq: Three times a day (TID) | ORAL | 3 refills | Status: DC

## 2021-12-25 MED ORDER — ROSUVASTATIN CALCIUM 40 MG PO TABS: 40.0000 mg | ORAL_TABLET | Freq: Every day | ORAL | 3 refills | Status: DC

## 2021-12-25 MED ORDER — FUROSEMIDE 80 MG PO TABS: 80.0000 mg | ORAL_TABLET | Freq: Two times a day (BID) | ORAL | 1 refills | Status: DC

## 2021-12-25 NOTE — Discharge Summary (Signed)
Physician Discharge Summary  Ana [name] FIE:[id number] DOB: 18-Jul-1970 DOA: 12/21/2021  PCP: System, Provider Not In  Admit date: 12/21/2021 Discharge date: 12/25/2021  Admitted From: Home Disposition: Home  Recommendations for Outpatient Follow-up:  Follow up with PCP in 1-2 weeks Please obtain BMP/CBC in one week Follow-up with Dr. Theador Hawthorne tomorrow as previously scheduled  Discharge Condition: Stable CODE STATUS: Full code Diet recommendation: Heart healthy, carb modified  Brief/Interim Summary: 51 year old female with a history of chronic kidney disease stage V, chronically on diuretics, reports that she ran out of her medicine approximately a week prior to admission.  She comes to the hospital with worsening edema, acute respiratory failure related to decompensated CHF. She was treated with intravenous Lasix and fortunately had good urine output with improvement of her volume status.  Nephrology following as an inpatient and helped directing her diuretic regimen.  With diuresis, however overall volume status has improved and she is now approaching euvolemia.  She is on room air and is able to ambulate without difficulty.  She has a follow-up appoint with her primary nephrologist tomorrow.  Discharge Diagnoses:  Principal Problem:   Acute on chronic diastolic CHF (congestive heart failure) (HCC) Active Problems:   Acute respiratory failure with hypoxia (HCC)   Essential hypertension, benign   Hypothyroidism, adult   Type 2 diabetes mellitus with hyperlipidemia (HCC)   Class 3 obesity (HCC)   CKD (chronic kidney disease) stage 5, GFR less than 15 ml/min (HCC)  Acute respiratory failure with hypoxia -Secondary to pulmonary edema -Initially required BiPAP shortly after admission, she has since been weaned back down to nasal cannula -She was subsequently able to wean off of oxygen and is currently on room air.  Able to ambulate without difficulty.   Acute on chronic diastolic  congestive heart failure -Recent echocardiogram from 10/2021 showed normal EF with grade 2 diastolic dysfunction -She was treated with intravenous Lasix and metolazone -Continue on carvedilol -Not on ARB due to renal dysfunction -Volume status improved and she is now approaching euvolemia -Discussed with nephrology, she was restarted on her home dose of Lasix 80 mg twice daily.  She reported that she was doing well l on this regimen before she ran out of her medication -Follow-up with primary nephrologist tomorrow as previously scheduled   CKD stage V -Creatinine is near baseline -Can continue to monitor renal function with diuresis -Continue follow-up with her primary nephrologist     Hypertension -Continue home dose of Norvasc, carvedilol, hydralazine -Blood pressures have improved with diuresis   Hypothyroidism -Continue Synthroid   Diabetes type 2 with hyperglycemia -Diet controlled -Started on sliding scale insulin -Blood sugars currently stable   Obesity, class III -BMI 40 -Discussed importance of healthy eating habits and physical activity   Anemia of chronic kidney disease -She is receiving IV iron -Hemoglobin has remained stable -Would defer initiation of ESA to nephrology  Discharge Instructions  Discharge Instructions     Diet - low sodium heart healthy   Complete by: As directed    Increase activity slowly   Complete by: As directed       Allergies as of 12/25/2021       Reactions   Ranitidine Rash   Ranitidine Hcl Rash        Medication List     STOP taking these medications    losartan 100 MG tablet Commonly known as: COZAAR       TAKE these medications    acetaminophen 325 MG tablet Commonly  known as: TYLENOL Take 2 tablets (650 mg total) by mouth every 6 (six) hours as needed for mild pain or moderate pain (or Fever >/= 101).   amLODipine 10 MG tablet Commonly known as: NORVASC TAKE 1 TABLET BY MOUTH DAILY   carvedilol 25 MG  tablet Commonly known as: COREG Take 1 tablet (25 mg total) by mouth 2 (two) times daily.   furosemide 80 MG tablet Commonly known as: LASIX Take 1 tablet (80 mg total) by mouth 2 (two) times daily. What changed: medication strength   hydrALAZINE 50 MG tablet Commonly known as: APRESOLINE Take 1.5 tablets (75 mg total) by mouth 3 (three) times daily.   levothyroxine 50 MCG tablet Commonly known as: SYNTHROID TAKE 1 TABLET(50 MCG) BY MOUTH DAILY What changed:  how much to take how to take this when to take this additional instructions   rosuvastatin 40 MG tablet Commonly known as: CRESTOR Take 1 tablet (40 mg total) by mouth daily with supper.   sodium bicarbonate 650 MG tablet Take 650 mg by mouth 4 (four) times daily.        Follow-up Information     Liana Gerold, MD Follow up.   Specialty: Nephrology Why: follow up tomorrow as previously scheduled Contact information: [address] [place] [address] [phone]                Allergies  Allergen Reactions   Ranitidine Rash   Ranitidine Hcl Rash    Consultations: Nephrology   Procedures/Studies: DG Chest Portable 1 View  Result Date: 12/21/2021 CLINICAL DATA:  Shortness of breath EXAM: PORTABLE CHEST 1 VIEW COMPARISON:  11/13/21 CXR FINDINGS: Low lung volumes. Enlarged cardiac and mediastinal contours, likely similar to prior exam when accounting for differences in lung volumes. Redemonstrated are hazy bilateral pulmonary opacities and prominent interstitial opacities, suggestive of pulmonary edema, similar to prior exam. There are more focal bibasilar pulmonary opacities which are nonspecific and may represent a combination of pleural effusion and atelectasis in the setting of low lung volumes and pulmonary edema. No displaced rib fractures. Degenerative changes of the bilateral AC joints. The upper abdomen is poorly visualized, but is without a radiographically discernible  abnormality. IMPRESSION: 1. Cardiomegaly and pulmonary edema. 2. More focal bibasilar pulmonary opacities are nonspecific and may represent a combination of pleural effusion and atelectasis in the setting of low lung volumes and pulmonary edema. Electronically Signed   By: Marin Roberts M.D.   On: 12/21/2021 08:04      Subjective: She is feeling better.  Shortness of breath is better.  Able to ambulate on room air without difficulty.  Discharge Exam: Vitals:   12/24/21 2143 12/24/21 2250 12/25/21 0500 12/25/21 0505  BP: 120/68 138/61  (!) 147/82  Pulse: 78 75  76  Resp: 18 18  20   Temp: 98.4 F (36.9 C) 98.2 F (36.8 C)  97.9 F (36.6 C)  TempSrc: Oral Oral    SpO2: 100% 98%  95%  Weight:   94 kg   Height:        General: Pt is alert, awake, not in acute distress Cardiovascular: RRR, S1/S2 +, no rubs, no gallops Respiratory: CTA bilaterally, no wheezing, no rhonchi Abdominal: Soft, NT, ND, bowel sounds + Extremities: no edema, no cyanosis    The results of significant diagnostics from this hospitalization (including imaging, microbiology, ancillary and laboratory) are listed below for reference.     Microbiology: Recent Results (from the past 240 hour(s))  SARS  Coronavirus 2 by RT PCR (hospital order, performed in [facility] hospital lab) *cepheid single result test* Anterior Nasal Swab     Status: None   Collection Time: 12/21/21  7:20 AM   Specimen: Anterior Nasal Swab  Result Value Ref Range Status   SARS Coronavirus 2 by RT PCR NEGATIVE NEGATIVE Final    Comment: (NOTE) SARS-CoV-2 target nucleic acids are NOT DETECTED.  The SARS-CoV-2 RNA is generally detectable in upper and lower respiratory specimens during the acute phase of infection. The lowest concentration of SARS-CoV-2 viral copies this assay can detect is 250 copies / mL. A negative result does not preclude SARS-CoV-2 infection and should not be used as the sole basis for treatment or other patient  management decisions.  A negative result may occur with improper specimen collection / handling, submission of specimen other than nasopharyngeal swab, presence of viral mutation(s) within the areas targeted by this assay, and inadequate number of viral copies (<250 copies / mL). A negative result must be combined with clinical observations, patient history, and epidemiological information.  Fact Sheet for Patients:   https://www.patel.info/  Fact Sheet for Healthcare Providers: https://hall.com/  This test is not yet approved or  cleared by the [place] FDA and has been authorized for detection and/or diagnosis of SARS-CoV-2 by FDA under an Emergency Use Authorization (EUA).  This EUA will remain in effect (meaning this test can be used) for the duration of the COVID-19 declaration under Section 564(b)(1) of the Act, 21 U.S.C. section 360bbb-3(b)(1), unless the authorization is terminated or revoked sooner.  Performed at [facility], [address], [place], [place] [address]   MRSA Next Gen by PCR, Nasal     Status: None   Collection Time: 12/21/21 10:55 AM   Specimen: Nasal Mucosa; Nasal Swab  Result Value Ref Range Status   MRSA by PCR Next Gen NOT DETECTED NOT DETECTED Final    Comment: (NOTE) The GeneXpert MRSA Assay (FDA approved for NASAL specimens only), is one component of a comprehensive MRSA colonization surveillance program. It is not intended to diagnose MRSA infection nor to guide or monitor treatment for MRSA infections. Test performance is not FDA approved in patients less than 22 years old. Performed at [facility], [address] [place], [place] [address]      Labs: BNP (last 3 results) Recent Labs    11/13/21 0900 12/21/21 0719  BNP 546.0* 284.1*   Basic Metabolic Panel: Recent Labs  Lab 12/21/21 0719 12/22/21 0335 12/23/21 0504 12/24/21 0340 12/25/21 0556  NA 137 140 138 138 136  K 4.3 4.2 4.4  4.0 3.7  CL 108 109 106 105 102  CO2 19* 21* 20* 23 24  GLUCOSE 243* 139* 175* 171* 158*  BUN 63* 68* 69* 70* 64*  CREATININE 5.82* 5.80* 6.38* 6.16* 5.81*  CALCIUM 8.5* 8.4* 8.5* 8.7* 8.6*  PHOS  --  5.6* 5.6* 5.1* 4.9*   Liver Function Tests: Recent Labs  Lab 12/21/21 0719 12/22/21 0335 12/23/21 0504 12/24/21 0340 12/25/21 0556  AST 17  --   --   --   --   ALT 12  --   --   --   --   ALKPHOS 93  --   --   --   --   BILITOT 0.3  --   --   --   --   PROT 7.4  --   --   --   --   ALBUMIN 3.4* 3.2* 3.4*  3.3* 3.2*   No results for input(s): "LIPASE", "AMYLASE" in the last 168 hours. No results for input(s): "AMMONIA" in the last 168 hours. CBC: Recent Labs  Lab 12/21/21 0719 12/22/21 0334 12/22/21 0515 12/22/21 1605 12/23/21 0504 12/24/21 0341  WBC 7.0 9.4  --  8.8 9.1 9.3  NEUTROABS 5.0  --   --   --   --   --   HGB 7.5* 6.9* 7.0* 7.2* 7.3* 7.3*  HCT 23.9* 22.2* 22.3* 22.5* 23.2* 22.8*  MCV 90.2 91.7  --  90.7 91.7 89.4  PLT 257 241  --  235 243 245   Cardiac Enzymes: No results for input(s): "CKTOTAL", "CKMB", "CKMBINDEX", "TROPONINI" in the last 168 hours. BNP: Invalid input(s): "POCBNP" CBG: Recent Labs  Lab 12/24/21 1154 12/24/21 1623 12/24/21 2145 12/25/21 0748 12/25/21 1143  GLUCAP 180* 176* 159* 164* 201*   D-Dimer No results for input(s): "DDIMER" in the last 72 hours. Hgb A1c No results for input(s): "HGBA1C" in the last 72 hours. Lipid Profile No results for input(s): "CHOL", "HDL", "LDLCALC", "TRIG", "CHOLHDL", "LDLDIRECT" in the last 72 hours. Thyroid function studies No results for input(s): "TSH", "T4TOTAL", "T3FREE", "THYROIDAB" in the last 72 hours.  Invalid input(s): "FREET3" Anemia work up No results for input(s): "VITAMINB12", "FOLATE", "FERRITIN", "TIBC", "IRON", "RETICCTPCT" in the last 72 hours. Urinalysis    Component Value Date/Time   COLORURINE YELLOW 11/13/2021 1007   APPEARANCEUR CLOUDY (A) 11/13/2021 1007   LABSPEC  1.010 11/13/2021 1007   PHURINE 6.0 11/13/2021 1007   GLUCOSEU >=500 (A) 11/13/2021 1007   HGBUR SMALL (A) 11/13/2021 1007   BILIRUBINUR NEGATIVE 11/13/2021 1007   BILIRUBINUR negative 01/07/2013 1149   KETONESUR NEGATIVE 11/13/2021 1007   PROTEINUR >=300 (A) 11/13/2021 1007   UROBILINOGEN 0.2 10/25/2014 2235   NITRITE NEGATIVE 11/13/2021 1007   LEUKOCYTESUR LARGE (A) 11/13/2021 1007   Sepsis Labs Recent Labs  Lab 12/22/21 0334 12/22/21 1605 12/23/21 0504 12/24/21 0341  WBC 9.4 8.8 9.1 9.3   Microbiology Recent Results (from the past 240 hour(s))  SARS Coronavirus 2 by RT PCR (hospital order, performed in [facility] hospital lab) *cepheid single result test* Anterior Nasal Swab     Status: None   Collection Time: 12/21/21  7:20 AM   Specimen: Anterior Nasal Swab  Result Value Ref Range Status   SARS Coronavirus 2 by RT PCR NEGATIVE NEGATIVE Final    Comment: (NOTE) SARS-CoV-2 target nucleic acids are NOT DETECTED.  The SARS-CoV-2 RNA is generally detectable in upper and lower respiratory specimens during the acute phase of infection. The lowest concentration of SARS-CoV-2 viral copies this assay can detect is 250 copies / mL. A negative result does not preclude SARS-CoV-2 infection and should not be used as the sole basis for treatment or other patient management decisions.  A negative result may occur with improper specimen collection / handling, submission of specimen other than nasopharyngeal swab, presence of viral mutation(s) within the areas targeted by this assay, and inadequate number of viral copies (<250 copies / mL). A negative result must be combined with clinical observations, patient history, and epidemiological information.  Fact Sheet for Patients:   https://www.patel.info/  Fact Sheet for Healthcare Providers: https://hall.com/  This test is not yet approved or  cleared by the [place] FDA and has been  authorized for detection and/or diagnosis of SARS-CoV-2 by FDA under an Emergency Use Authorization (EUA).  This EUA will remain in effect (meaning this test can be used) for the  duration of the COVID-19 declaration under Section 564(b)(1) of the Act, 21 U.S.C. section 360bbb-3(b)(1), unless the authorization is terminated or revoked sooner.  Performed at [facility], [address] [place], [place] [address]   MRSA Next Gen by PCR, Nasal     Status: None   Collection Time: 12/21/21 10:55 AM   Specimen: Nasal Mucosa; Nasal Swab  Result Value Ref Range Status   MRSA by PCR Next Gen NOT DETECTED NOT DETECTED Final    Comment: (NOTE) The GeneXpert MRSA Assay (FDA approved for NASAL specimens only), is one component of a comprehensive MRSA colonization surveillance program. It is not intended to diagnose MRSA infection nor to guide or monitor treatment for MRSA infections. Test performance is not FDA approved in patients less than 70 years old. Performed at [facility], [address] [place], [place] [address]      Time coordinating discharge: 60mins  SIGNED:   Kathie Dike, MD  [facility] 12/25/2021, 3:31 PM   If 7PM-7AM, please contact night-coverage [email or web address]

## 2021-12-25 NOTE — Plan of Care (Signed)
  Problem: Education: Goal: Ability to demonstrate management of disease process will improve Outcome: Adequate for Discharge

## 2021-12-25 NOTE — Plan of Care (Signed)

## 2021-12-26 ENCOUNTER — Telehealth: Payer: Self-pay

## 2021-12-26 DIAGNOSIS — E1165 Type 2 diabetes mellitus with hyperglycemia: Secondary | ICD-10-CM | POA: Diagnosis not present

## 2021-12-26 NOTE — Patient Outreach (Signed)
Care Coordination  12/26/2021  Ana [name] [birth year] [id number]   Transition Care Management Unsuccessful Follow-up Telephone Call  Date of discharge and from where:  12/25/21 [facility]  Attempts:  1st Attempt  Reason for unsuccessful TCM follow-up call:  Voice mail full   Mickel Fuchs, [place], [place] Medicaid Team  [phone]

## 2021-12-27 DIAGNOSIS — E1165 Type 2 diabetes mellitus with hyperglycemia: Secondary | ICD-10-CM | POA: Diagnosis not present

## 2021-12-28 DIAGNOSIS — E1165 Type 2 diabetes mellitus with hyperglycemia: Secondary | ICD-10-CM | POA: Diagnosis not present

## 2021-12-29 DIAGNOSIS — E1165 Type 2 diabetes mellitus with hyperglycemia: Secondary | ICD-10-CM | POA: Diagnosis not present

## 2021-12-30 DIAGNOSIS — E1165 Type 2 diabetes mellitus with hyperglycemia: Secondary | ICD-10-CM | POA: Diagnosis not present

## 2021-12-31 DIAGNOSIS — E1165 Type 2 diabetes mellitus with hyperglycemia: Secondary | ICD-10-CM | POA: Diagnosis not present

## 2022-01-01 DIAGNOSIS — E1165 Type 2 diabetes mellitus with hyperglycemia: Secondary | ICD-10-CM | POA: Diagnosis not present

## 2022-01-02 DIAGNOSIS — E1165 Type 2 diabetes mellitus with hyperglycemia: Secondary | ICD-10-CM | POA: Diagnosis not present

## 2022-01-03 DIAGNOSIS — E1165 Type 2 diabetes mellitus with hyperglycemia: Secondary | ICD-10-CM | POA: Diagnosis not present

## 2022-01-04 DIAGNOSIS — E1165 Type 2 diabetes mellitus with hyperglycemia: Secondary | ICD-10-CM | POA: Diagnosis not present

## 2022-01-05 DIAGNOSIS — E1165 Type 2 diabetes mellitus with hyperglycemia: Secondary | ICD-10-CM | POA: Diagnosis not present

## 2022-01-06 DIAGNOSIS — E1165 Type 2 diabetes mellitus with hyperglycemia: Secondary | ICD-10-CM | POA: Diagnosis not present

## 2022-01-07 DIAGNOSIS — E1165 Type 2 diabetes mellitus with hyperglycemia: Secondary | ICD-10-CM | POA: Diagnosis not present

## 2022-01-08 DIAGNOSIS — E1165 Type 2 diabetes mellitus with hyperglycemia: Secondary | ICD-10-CM | POA: Diagnosis not present

## 2022-01-09 DIAGNOSIS — E1165 Type 2 diabetes mellitus with hyperglycemia: Secondary | ICD-10-CM | POA: Diagnosis not present

## 2022-01-09 DIAGNOSIS — N189 Chronic kidney disease, unspecified: Secondary | ICD-10-CM | POA: Diagnosis not present

## 2022-01-09 DIAGNOSIS — R808 Other proteinuria: Secondary | ICD-10-CM | POA: Diagnosis not present

## 2022-01-09 DIAGNOSIS — N170 Acute kidney failure with tubular necrosis: Secondary | ICD-10-CM | POA: Diagnosis not present

## 2022-01-09 DIAGNOSIS — E8722 Chronic metabolic acidosis: Secondary | ICD-10-CM | POA: Diagnosis not present

## 2022-01-09 DIAGNOSIS — E1122 Type 2 diabetes mellitus with diabetic chronic kidney disease: Secondary | ICD-10-CM | POA: Diagnosis not present

## 2022-01-09 DIAGNOSIS — I129 Hypertensive chronic kidney disease with stage 1 through stage 4 chronic kidney disease, or unspecified chronic kidney disease: Secondary | ICD-10-CM | POA: Diagnosis not present

## 2022-01-10 DIAGNOSIS — E1165 Type 2 diabetes mellitus with hyperglycemia: Secondary | ICD-10-CM | POA: Diagnosis not present

## 2022-01-11 DIAGNOSIS — E1165 Type 2 diabetes mellitus with hyperglycemia: Secondary | ICD-10-CM | POA: Diagnosis not present

## 2022-01-12 DIAGNOSIS — E1165 Type 2 diabetes mellitus with hyperglycemia: Secondary | ICD-10-CM | POA: Diagnosis not present

## 2022-01-13 DIAGNOSIS — E1165 Type 2 diabetes mellitus with hyperglycemia: Secondary | ICD-10-CM | POA: Diagnosis not present

## 2022-01-14 DIAGNOSIS — E1165 Type 2 diabetes mellitus with hyperglycemia: Secondary | ICD-10-CM | POA: Diagnosis not present

## 2022-01-15 DIAGNOSIS — E1165 Type 2 diabetes mellitus with hyperglycemia: Secondary | ICD-10-CM | POA: Diagnosis not present

## 2022-01-16 DIAGNOSIS — E1165 Type 2 diabetes mellitus with hyperglycemia: Secondary | ICD-10-CM | POA: Diagnosis not present

## 2022-01-17 DIAGNOSIS — E1165 Type 2 diabetes mellitus with hyperglycemia: Secondary | ICD-10-CM | POA: Diagnosis not present

## 2022-01-18 DIAGNOSIS — E1165 Type 2 diabetes mellitus with hyperglycemia: Secondary | ICD-10-CM | POA: Diagnosis not present

## 2022-01-19 ENCOUNTER — Other Ambulatory Visit: Payer: Self-pay | Admitting: Nurse Practitioner

## 2022-01-19 DIAGNOSIS — E1165 Type 2 diabetes mellitus with hyperglycemia: Secondary | ICD-10-CM | POA: Diagnosis not present

## 2022-01-20 DIAGNOSIS — E1165 Type 2 diabetes mellitus with hyperglycemia: Secondary | ICD-10-CM | POA: Diagnosis not present

## 2022-01-21 DIAGNOSIS — E1165 Type 2 diabetes mellitus with hyperglycemia: Secondary | ICD-10-CM | POA: Diagnosis not present

## 2022-01-22 DIAGNOSIS — E1165 Type 2 diabetes mellitus with hyperglycemia: Secondary | ICD-10-CM | POA: Diagnosis not present

## 2022-01-23 DIAGNOSIS — E1165 Type 2 diabetes mellitus with hyperglycemia: Secondary | ICD-10-CM | POA: Diagnosis not present

## 2022-01-24 DIAGNOSIS — E1165 Type 2 diabetes mellitus with hyperglycemia: Secondary | ICD-10-CM | POA: Diagnosis not present

## 2022-01-25 DIAGNOSIS — E1165 Type 2 diabetes mellitus with hyperglycemia: Secondary | ICD-10-CM | POA: Diagnosis not present

## 2022-01-26 DIAGNOSIS — E1165 Type 2 diabetes mellitus with hyperglycemia: Secondary | ICD-10-CM | POA: Diagnosis not present

## 2022-01-27 DIAGNOSIS — E1165 Type 2 diabetes mellitus with hyperglycemia: Secondary | ICD-10-CM | POA: Diagnosis not present

## 2022-01-28 DIAGNOSIS — E1165 Type 2 diabetes mellitus with hyperglycemia: Secondary | ICD-10-CM | POA: Diagnosis not present

## 2022-01-29 DIAGNOSIS — E1165 Type 2 diabetes mellitus with hyperglycemia: Secondary | ICD-10-CM | POA: Diagnosis not present

## 2022-01-30 DIAGNOSIS — E1165 Type 2 diabetes mellitus with hyperglycemia: Secondary | ICD-10-CM | POA: Diagnosis not present

## 2022-01-31 DIAGNOSIS — E1165 Type 2 diabetes mellitus with hyperglycemia: Secondary | ICD-10-CM | POA: Diagnosis not present

## 2022-02-01 DIAGNOSIS — E1165 Type 2 diabetes mellitus with hyperglycemia: Secondary | ICD-10-CM | POA: Diagnosis not present

## 2022-02-02 DIAGNOSIS — E1165 Type 2 diabetes mellitus with hyperglycemia: Secondary | ICD-10-CM | POA: Diagnosis not present

## 2022-02-02 IMAGING — MG MM DIGITAL SCREENING BILAT W/ TOMO AND CAD
8 of 16 series · 8 of 40 positions shown · non-contrast
Comparison: Previous exam(s).

CLINICAL DATA: Screening.

EXAM:
DIGITAL SCREENING BILATERAL MAMMOGRAM WITH TOMOSYNTHESIS AND CAD
TECHNIQUE: Bilateral screening digital craniocaudal and mediolateral oblique
mammograms were obtained. Bilateral screening digital breast
tomosynthesis was performed. The images were evaluated with
computer-aided detection.

[R CC synth-2D (1 of 2)]
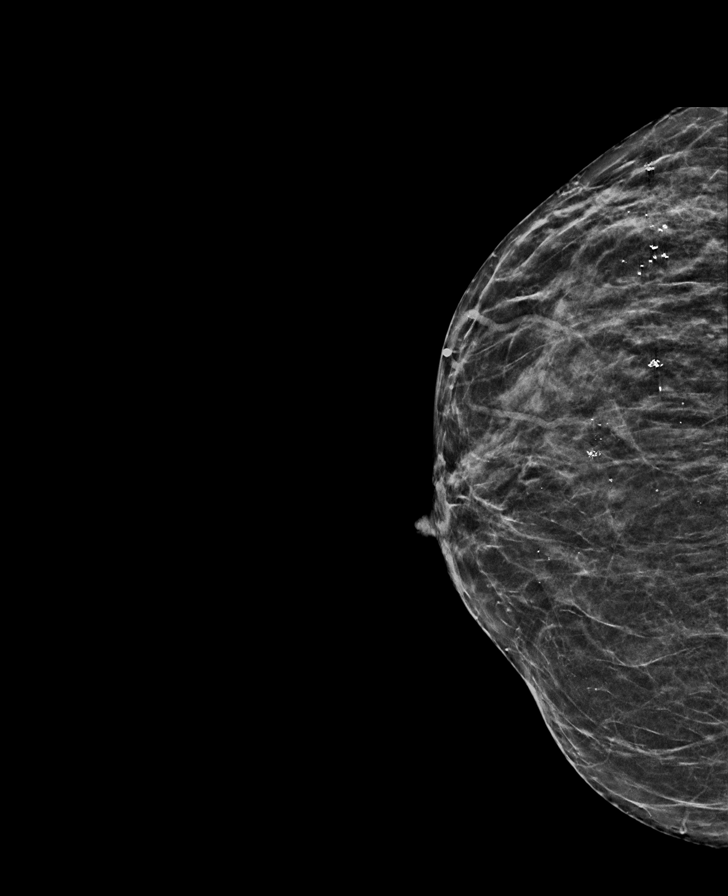

[L CC synth-2D (1 of 2)]
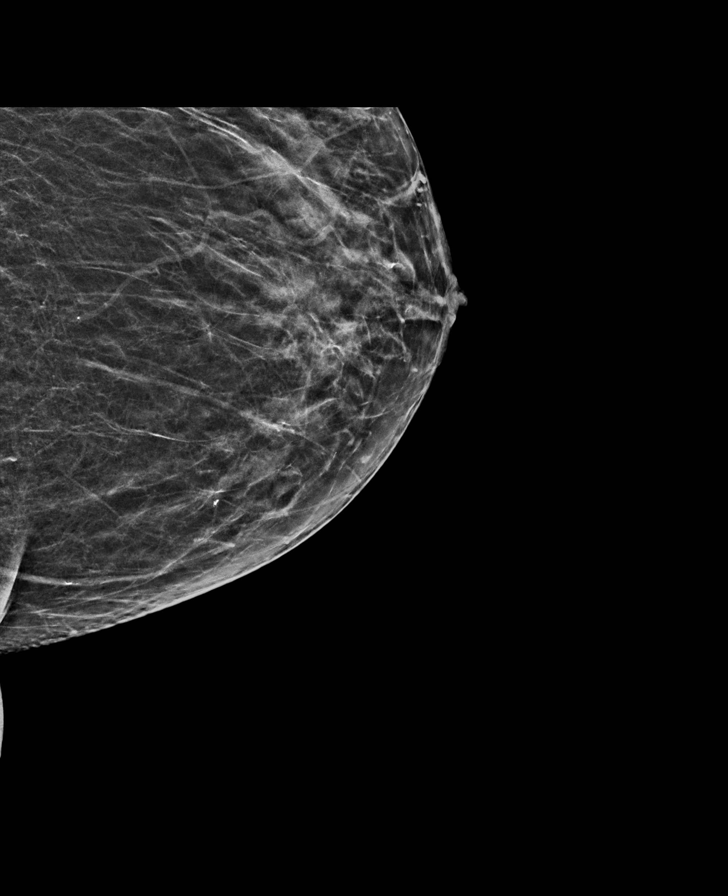

[L CC synth-2D (2 of 2)]
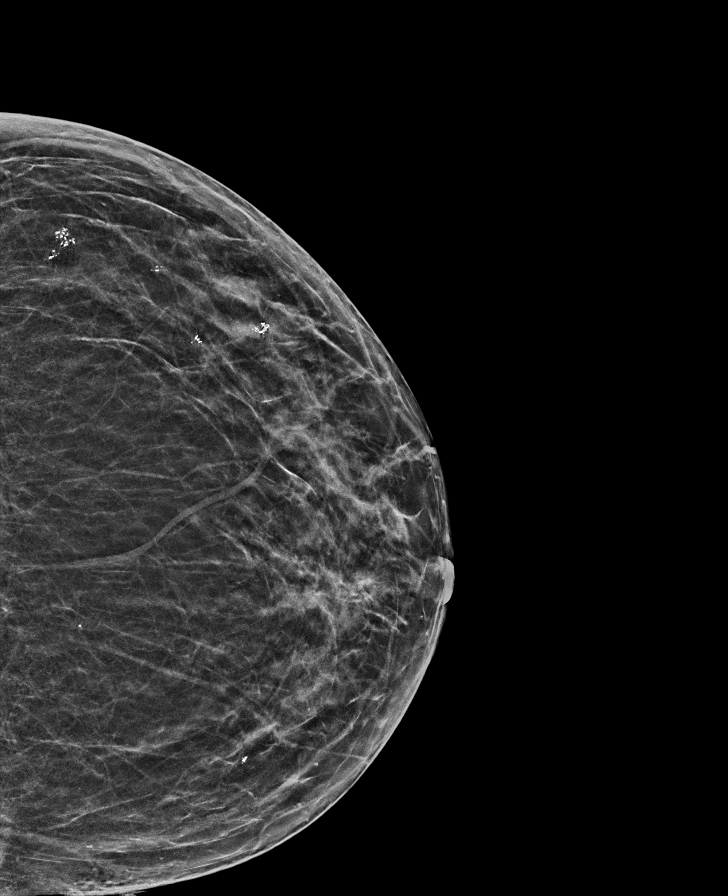

[R CC synth-2D (2 of 2)]
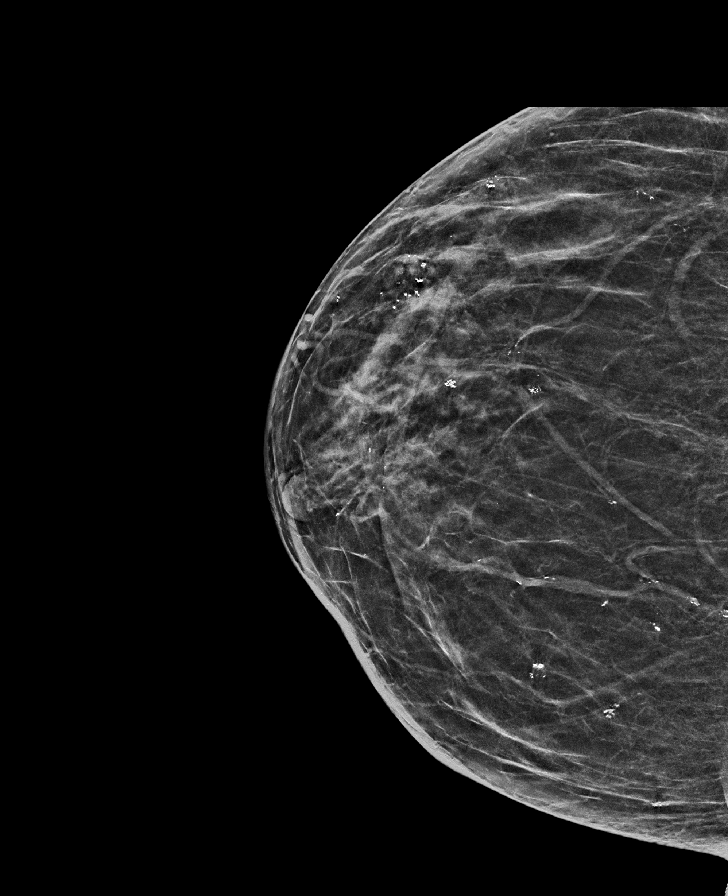

[L MLO synth-2D (1 of 3)]
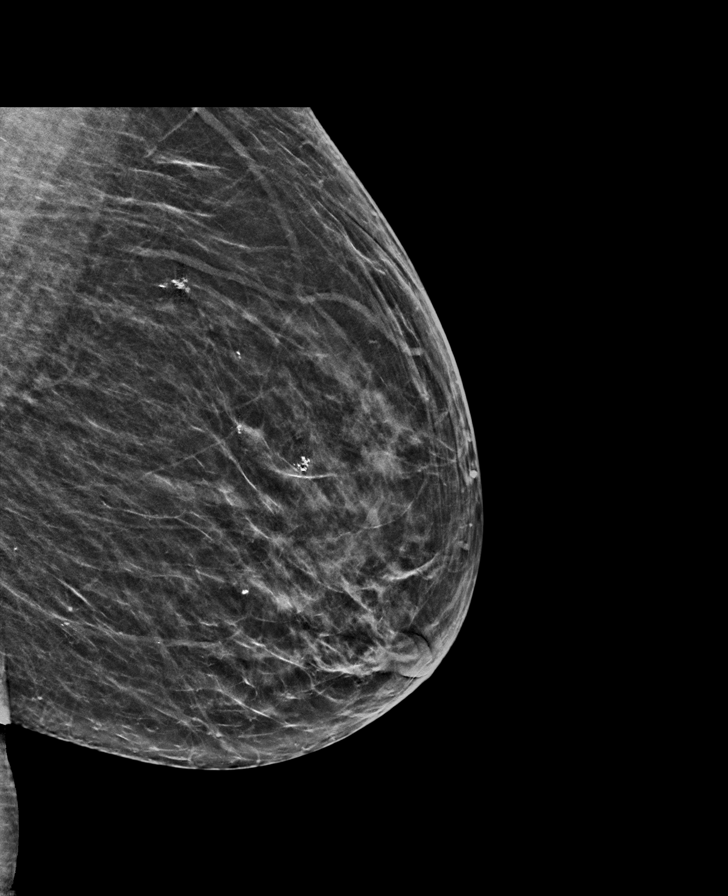

[L MLO synth-2D (2 of 3)]
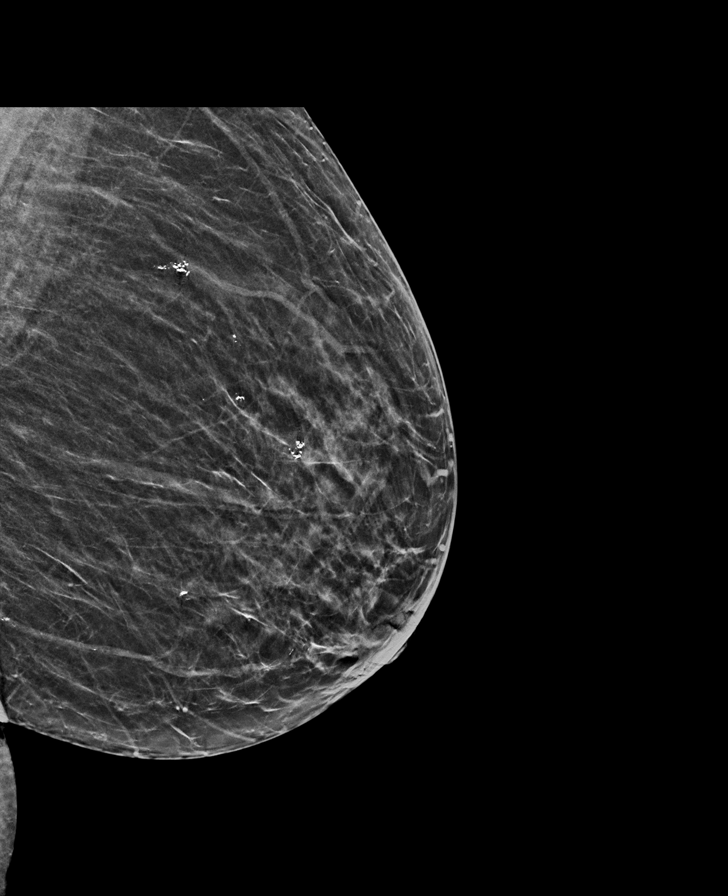

[L MLO synth-2D (3 of 3)]
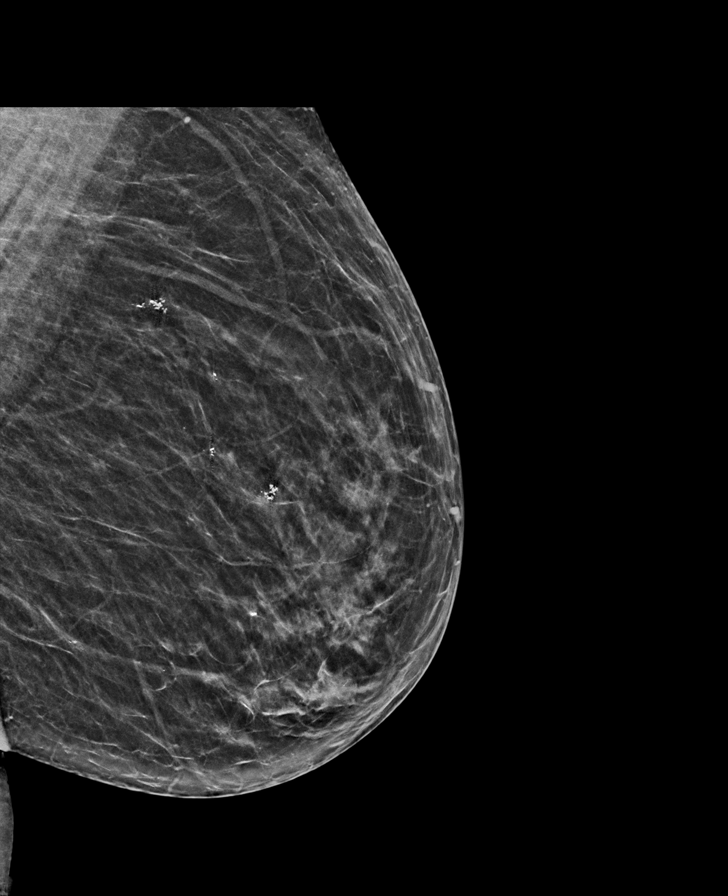

[R CC tomo · tomo slice 24/47.0]
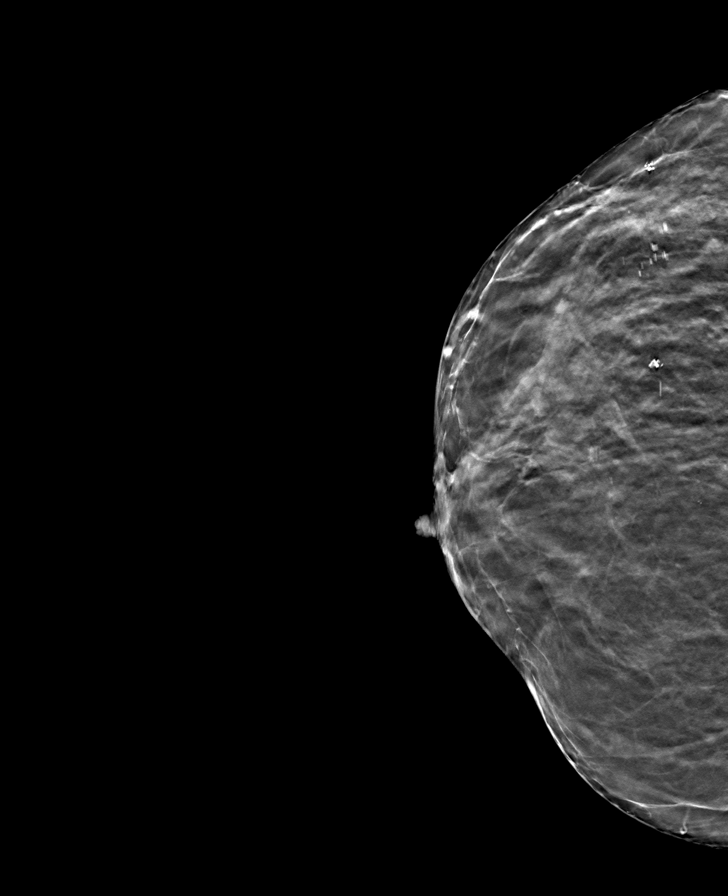

[8 of 40 positions shown; findings below may reference images not displayed]

ACR Breast Density Category c: The breast tissue is heterogeneously
dense, which may obscure small masses.
FINDINGS: There are no findings suspicious for malignancy.
IMPRESSION: No mammographic evidence of malignancy. A result letter of this
screening mammogram will be mailed directly to the patient.

RECOMMENDATION:
Screening mammogram in one year. (Code:[id number])

BI-RADS CATEGORY  1: Negative.

## 2022-02-03 DIAGNOSIS — E1165 Type 2 diabetes mellitus with hyperglycemia: Secondary | ICD-10-CM | POA: Diagnosis not present

## 2022-02-04 DIAGNOSIS — E1165 Type 2 diabetes mellitus with hyperglycemia: Secondary | ICD-10-CM | POA: Diagnosis not present

## 2022-02-05 DIAGNOSIS — E1165 Type 2 diabetes mellitus with hyperglycemia: Secondary | ICD-10-CM | POA: Diagnosis not present

## 2022-02-05 NOTE — Progress Notes (Deleted)
Cardiology Office Note   Date:  02/05/2022   ID:  Ana [name], DOB 02/04/1971, MRN [id number]  PCP:  System, Provider Not In  Cardiologist:   Dorris Carnes, MD   Pt returns for follow up of  HTN      History of Present Illness: Ana [name] is a 51 y.o. female  with a hx of HTN, DM, hyothyoridism, LE edema and chest tightness.  Echo in 2022 showed moderate LVH and vigorous LV function   Also small/moderate pericardial effusion  Chest tightness improved with Rx with lasix I last saw the pt in Aug 2022.   Since then she denies CP   Breathing is OK   Notes minimal leg swelling     I saw the pt in APril 2023   The pt says she has been feeling OK   BPat home 160/  No CP   Breathing is OK    Walks rarely to occas  I saw the pt in fall 2023     No outpatient medications have been marked as taking for the 02/07/22 encounter (Appointment) with Fay Records, MD.     Allergies:   Ranitidine and Ranitidine hcl   Past Medical History:  Diagnosis Date   CKD (chronic kidney disease)    Diabetes mellitus    diet controlled   Hypertension     Past Surgical History:  Procedure Laterality Date   CESAREAN SECTION  x4   CHOLECYSTECTOMY     EYE SURGERY     MASS EXCISION  10/27/2011   Procedure: EXCISION MASS;  Surgeon: Donato Heinz, MD;  Location: [facility];  Service: General;  Laterality: Right;   TUBAL LIGATION       Social History:  The patient  reports that she has never smoked. She has never used smokeless tobacco. She reports that she does not currently use alcohol. She reports that she does not use drugs.   Family History:  The patient's family history includes Diabetes in her brother, brother, and mother; Kidney disease in her father and mother; Kidney failure in her brother, father, and mother.    ROS:  Please see the history of present illness. All other systems are reviewed and  Negative to the above problem except as noted.    PHYSICAL EXAM: VS:  LMP 09/22/2019  (Approximate)   GEN: Morbidly obese 51 yo  in no acute distress  HEENT: normal  Neck: JVP is normal Cardiac: RRR; no murmurs  No LE  edema  Respiratory:  clear to auscultation bilaterally GI: soft, nontender, nondistended, + BS  No hepatomegaly  MS: no deformity Moving all extremities   Skin: warm and dry, no rash Neuro:  Strength and sensation are intact Psych: euthymic mood, full affect   EKG:  EKG is not ordered today.   Echo   09/16/20  Left ventricular ejection fraction, by estimation, is 60 to 65%. The left ventricle has normal function. The left ventricle has no regional wall motion abnormalities. There is moderate left ventricular hypertrophy. Left ventricular diastolic parameters were normal. 2. Right ventricular systolic function is normal. The right ventricular size is normal. There is normal pulmonary artery systolic pressure. The estimated right ventricular systolic pressure is 23.5 mmHg. 3. Left atrial size was mildly dilated. 4. Right atrial size was mildly dilated. 5. Small to moderate pericardial effusion. The pericardial effusion is circumferential with moderate collection posteriorly. No clear evidence of tamponade physiology. 6. The mitral valve is grossly normal.  Trivial mitral valve regurgitation. 7. The aortic valve is tricuspid. There is mild calcification of the aortic valve. Aortic valve regurgitation is not visualized. 8. The inferior vena cava is normal in size with greater than 50% respiratory variability, suggesting right atrial pressure of 3 mmHg.  Lipid Panel    Component Value Date/Time   CHOL 248 (H) 06/17/2021 1430   TRIG 75 06/17/2021 1430   HDL 70 06/17/2021 1430   CHOLHDL 3.5 06/17/2021 1430   VLDL 15 06/17/2021 1430   LDLCALC 163 (H) 06/17/2021 1430   LDLCALC 154 (H) 03/18/2020 0000      Wt Readings from Last 3 Encounters:  12/25/21 207 lb 3.7 oz (94 kg)  12/01/21 205 lb (93 kg)  11/16/21 206 lb 3.2 oz (93.5 kg)       ASSESSMENT AND PLAN:  1   HTN   BP remains high here and at home  WIll increase hydralazine to 75 tid    Continue other meds   Pt has appt in renal clinic today         2  LE edema   Trivial   FOllow     3  CKD   Cr 6.12   Appt in renal today        4  HL  Lipids today  5  Hx DM  Discussed diet   A1C in July 2022 was 10.5   Will recheck    Referral to [name] for comprehensive diabeit care care Follow up based on test results

## 2022-02-06 DIAGNOSIS — E1165 Type 2 diabetes mellitus with hyperglycemia: Secondary | ICD-10-CM | POA: Diagnosis not present

## 2022-02-07 ENCOUNTER — Ambulatory Visit: Payer: Medicaid Other | Admitting: Internal Medicine

## 2022-02-07 DIAGNOSIS — E1165 Type 2 diabetes mellitus with hyperglycemia: Secondary | ICD-10-CM | POA: Diagnosis not present

## 2022-02-08 DIAGNOSIS — E1165 Type 2 diabetes mellitus with hyperglycemia: Secondary | ICD-10-CM | POA: Diagnosis not present

## 2022-02-09 ENCOUNTER — Encounter: Payer: Self-pay | Admitting: Student

## 2022-02-09 ENCOUNTER — Ambulatory Visit: Payer: Medicaid Other | Attending: Internal Medicine | Admitting: Student

## 2022-02-09 VITALS — BP 130/80 | HR 78 | Ht 60.0 in | Wt 195.0 lb

## 2022-02-09 DIAGNOSIS — E782 Mixed hyperlipidemia: Secondary | ICD-10-CM

## 2022-02-09 DIAGNOSIS — I5032 Chronic diastolic (congestive) heart failure: Secondary | ICD-10-CM

## 2022-02-09 DIAGNOSIS — N185 Chronic kidney disease, stage 5: Secondary | ICD-10-CM

## 2022-02-09 DIAGNOSIS — I10 Essential (primary) hypertension: Secondary | ICD-10-CM

## 2022-02-09 DIAGNOSIS — E1165 Type 2 diabetes mellitus with hyperglycemia: Secondary | ICD-10-CM | POA: Diagnosis not present

## 2022-02-09 NOTE — Progress Notes (Signed)
Cardiology Office Note    Date:  02/09/2022   ID:  Ana [name], DOB 08/13/1970, MRN [id number]  PCP:  System, Provider Not In  Cardiologist: Dorris Carnes, MD    Chief Complaint  Patient presents with   Follow-up    2 month visit    History of Present Illness:    Ana [name] is a 51 y.o. female with past medical history of HFpEF, HTN, HLD, Type II DM and Stage IV-V CKD who presents to the office today for 32-month follow-up.  She was last examined by Dr. Harrington Challenger in 11/2021 and had previously been having chest tightness but this did improve with resumption of Lasix and she denied any recurrent pain since. Her BP was elevated and Hydralazine was titrated to 75 mg 3 times daily.  In the interim, she was admitted to [facility] from 10/25 - 12/25/2021 for acute hypoxic respiratory failure in the setting of an acute CHF exacerbation. Reported having been without her diuretic prior to admission. Nephrology followed the patient during admission and she responded well to high-dose IV Lasix 160 mg 3 times daily along with Metolazone 5 mg daily. She was transitioned back to PO Lasix 80 mg twice daily at the time of discharge. Creatinine did peak at 6.38 during her admission but had improved to 5.81 at the time of discharge.  In talking with the patient and her daughter today, she reports overall feeling well since her recent hospitalization. Says her breathing has been back to baseline and she denies any lower extremity edema, dyspnea on exertion, orthopnea or PND. No recent chest pain or palpitations.  Says she had been without Lasix for 2 to 3 weeks prior to admission due to not having refills and feels like this triggered her recent exacerbation.   Past Medical History:  Diagnosis Date   CKD (chronic kidney disease)    Diabetes mellitus    diet controlled   Hypertension     Past Surgical History:  Procedure Laterality Date   CESAREAN SECTION  x4   CHOLECYSTECTOMY     EYE SURGERY      MASS EXCISION  10/27/2011   Procedure: EXCISION MASS;  Surgeon: Donato Heinz, MD;  Location: [facility];  Service: General;  Laterality: Right;   TUBAL LIGATION      Current Medications: Outpatient Medications Prior to Visit  Medication Sig Dispense Refill   amLODipine (NORVASC) 10 MG tablet TAKE 1 TABLET BY MOUTH DAILY 90 tablet 3   carvedilol (COREG) 25 MG tablet Take 1 tablet (25 mg total) by mouth 2 (two) times daily. 60 tablet 0   furosemide (LASIX) 80 MG tablet Take 1 tablet (80 mg total) by mouth 2 (two) times daily. 60 tablet 1   hydrALAZINE (APRESOLINE) 50 MG tablet Take 1.5 tablets (75 mg total) by mouth 3 (three) times daily. 270 tablet 3   levothyroxine (SYNTHROID) 50 MCG tablet TAKE 1 TABLET(50 MCG) BY MOUTH DAILY 30 tablet 3   rosuvastatin (CRESTOR) 40 MG tablet Take 1 tablet (40 mg total) by mouth daily with supper. 90 tablet 3   sodium bicarbonate 650 MG tablet Take 650 mg by mouth 4 (four) times daily.     acetaminophen (TYLENOL) 325 MG tablet Take 2 tablets (650 mg total) by mouth every 6 (six) hours as needed for mild pain or moderate pain (or Fever >/= 101). (Patient not taking: Reported on 02/09/2022)     No facility-administered medications prior to visit.  Allergies:   Ranitidine and Ranitidine hcl   Social History   Socioeconomic History   Marital status: Single    Spouse name: Not on file   Number of children: Not on file   Years of education: Not on file   Highest education level: Not on file  Occupational History   Not on file  Tobacco Use   Smoking status: Never   Smokeless tobacco: Never  Vaping Use   Vaping Use: Never used  Substance and Sexual Activity   Alcohol use: Not Currently    Comment: occ   Drug use: No   Sexual activity: Yes    Birth control/protection: Surgical  Other Topics Concern   Not on file  Social History Narrative   Divorced,married for 8 years.Lives with daughter and 3 grandkids.Unemployed.   Social Determinants of  Health   Financial Resource Strain: Not on file  Food Insecurity: No Food Insecurity (12/22/2021)   Hunger Vital Sign    Worried About Running Out of Food in the Last Year: Never true    Ran Out of Food in the Last Year: Never true  Transportation Needs: No Transportation Needs (12/22/2021)   PRAPARE - Hydrologist (Medical): No    Lack of Transportation (Non-Medical): No  Physical Activity: Not on file  Stress: Not on file  Social Connections: Not on file     Family History:  The patient's family history includes Diabetes in her brother, brother, and mother; Kidney disease in her father and mother; Kidney failure in her brother, father, and mother.   Review of Systems:    Please see the history of present illness.     All other systems reviewed and are otherwise negative except as noted above.   Physical Exam:    VS:  BP 130/80   Pulse 78   Ht 5' (1.524 m)   Wt 195 lb (88.5 kg)   LMP 09/22/2019 (Approximate)   SpO2 97%   BMI 38.08 kg/m    General: Well developed, well nourished,female appearing in no acute distress. Head: Normocephalic, atraumatic. Neck: No carotid bruits. JVD not elevated.  Lungs: Respirations regular and unlabored, without wheezes or rales.  Heart: Regular rate and rhythm. No S3 or S4.  No murmur, no rubs, or gallops appreciated. Abdomen: Appears non-distended. No obvious abdominal masses. Msk:  Strength and tone appear normal for age. No obvious joint deformities or effusions. Extremities: No clubbing or cyanosis. No pitting edema.  Distal pedal pulses are 2+ bilaterally. Neuro: Alert and oriented X 3. Moves all extremities spontaneously. No focal deficits noted. Psych:  Responds to questions appropriately with a normal affect. Skin: No rashes or lesions noted  Wt Readings from Last 3 Encounters:  02/09/22 195 lb (88.5 kg)  12/25/21 207 lb 3.7 oz (94 kg)  12/01/21 205 lb (93 kg)      Studies/Labs Reviewed:   EKG:   EKG is not ordered today.    Recent Labs: 11/13/2021: TSH 3.795 11/16/2021: Magnesium 1.7 12/21/2021: ALT 12; B Natriuretic Peptide 929.0 12/24/2021: Hemoglobin 7.3; Platelets 245 12/25/2021: BUN 64; Creatinine, Ser 5.81; Potassium 3.7; Sodium 136   Lipid Panel    Component Value Date/Time   CHOL 248 (H) 06/17/2021 1430   TRIG 75 06/17/2021 1430   HDL 70 06/17/2021 1430   CHOLHDL 3.5 06/17/2021 1430   VLDL 15 06/17/2021 1430   LDLCALC 163 (H) 06/17/2021 1430   LDLCALC 154 (H) 03/18/2020 0000    Additional studies/  records that were reviewed today include:   Echocardiogram: 11/14/2021 IMPRESSIONS     1. Left ventricular ejection fraction, by estimation, is 60 to 65%. The  left ventricle has normal function. The left ventricle has no regional  wall motion abnormalities. Left ventricular diastolic parameters are  consistent with Grade II diastolic  dysfunction (pseudonormalization). Elevated left ventricular end-diastolic  pressure.   2. Right ventricular systolic function was not well visualized. The right  ventricular size is not well visualized. Tricuspid regurgitation signal is  inadequate for assessing PA pressure.   3. Left atrial size was moderately dilated.   4. A small pericardial effusion is present. The pericardial effusion is  circumferential. There is no evidence of cardiac tamponade.   5. The mitral valve is normal in structure. No evidence of mitral valve  regurgitation. No evidence of mitral stenosis.   6. The aortic valve is tricuspid. Aortic valve regurgitation is not  visualized. Aortic valve sclerosis/calcification is present, without any  evidence of aortic stenosis.    Assessment:    1. Chronic heart failure with preserved ejection fraction ([place])   2. Essential hypertension   3. Mixed hyperlipidemia   4. CKD (chronic kidney disease) stage 5, GFR less than 15 ml/min (HCC)      Plan:   In order of problems listed above:  1. Chronic HFpEF -  Echocardiogram in 10/2021 showed a preserved EF of 60 to 65% with grade 2 diastolic dysfunction. She did have a recent CHF exacerbation in the setting of having been without diuretic therapy but reports symptoms have overall been well-controlled since returning home.  Will defer diuretic therapy to Nephrology but anticipate continuing Lasix 80 mg twice daily. I encouraged her to continue to limit sodium intake. Would not anticipate starting an SGLT2 inhibitor at this time given her GFR of 8 by most recent labs.  2. HTN - Her BP is well-controlled at 130/80 during today's visit. Continue current medical therapy with Amlodipine 10 mg daily, Coreg 25 mg twice daily and Hydralazine 75 mg 3 times daily.  3. HLD - Followed by her PCP. She remains on Crestor 40 mg daily.  4. Stage V CKD - Followed by Dr. Theador Hawthorne. Her creatinine did peak at 6.38 during her recent admission and had improved to 5.81 at the time of hospital discharge. I did encourage her to arrange follow-up with Nephrology as she has not been evaluated since hospital discharge.    Medication Adjustments/Labs and Tests Ordered: Current medicines are reviewed at length with the patient today.  Concerns regarding medicines are outlined above.  Medication changes, Labs and Tests ordered today are listed in the Patient Instructions below. Patient Instructions  Medication Instructions:  Your physician recommends that you continue on your current medications as directed. Please refer to the Current Medication list given to you today.  *If you need a refill on your cardiac medications before your next appointment, please call your pharmacy*   Lab Work: NONE   If you have labs (blood work) drawn today and your tests are completely normal, you will receive your results only by: [place] (if you have MyChart) OR A paper copy in the mail If you have any lab test that is abnormal or we need to change your treatment, we will call you to  review the results.   Testing/Procedures: NONE    Follow-Up: At [facility], you and your health needs are our priority.  As part of our continuing mission to provide you  with exceptional heart care, we have created designated Provider Care Teams.  These Care Teams include your primary Cardiologist (physician) and Advanced Practice Providers (APPs -  Physician Assistants and Nurse Practitioners) who all work together to provide you with the care you need, when you need it.  We recommend signing up for the patient portal called "MyChart".  Sign up information is provided on this After Visit Summary.  MyChart is used to connect with patients for Virtual Visits (Telemedicine).  Patients are able to view lab/test results, encounter notes, upcoming appointments, etc.  Non-urgent messages can be sent to your provider as well.   To learn more about what you can do with MyChart, go to [email or web address].    Your next appointment:   6 month(s)  The format for your next appointment:   In Person  Provider:   Dorris Carnes, MD    Other Instructions Thank you for choosing [facility]!    Important Information About Sugar         Signed, Erma Heritage, PA-C  02/09/2022 3:58 PM    [facility] [address] [place], [place] [address] Phone: [phone] Fax: [phone]

## 2022-02-09 NOTE — Patient Instructions (Signed)
Medication Instructions:  Your physician recommends that you continue on your current medications as directed. Please refer to the Current Medication list given to you today.  *If you need a refill on your cardiac medications before your next appointment, please call your pharmacy*   Lab Work: NONE   If you have labs (blood work) drawn today and your tests are completely normal, you will receive your results only by: MyChart Message (if you have MyChart) OR A paper copy in the mail If you have any lab test that is abnormal or we need to change your treatment, we will call you to review the results.   Testing/Procedures: NONE    Follow-Up: At [facility], you and your health needs are our priority.  As part of our continuing mission to provide you with exceptional heart care, we have created designated Provider Care Teams.  These Care Teams include your primary Cardiologist (physician) and Advanced Practice Providers (APPs -  Physician Assistants and Nurse Practitioners) who all work together to provide you with the care you need, when you need it.  We recommend signing up for the patient portal called "MyChart".  Sign up information is provided on this After Visit Summary.  MyChart is used to connect with patients for Virtual Visits (Telemedicine).  Patients are able to view lab/test results, encounter notes, upcoming appointments, etc.  Non-urgent messages can be sent to your provider as well.   To learn more about what you can do with MyChart, go to [email or web address].    Your next appointment:   6 month(s)  The format for your next appointment:   In Person  Provider:   Paula Ross, MD    Other Instructions Thank you for choosing  [facility]!    Important Information About Sugar       

## 2022-02-10 DIAGNOSIS — E1165 Type 2 diabetes mellitus with hyperglycemia: Secondary | ICD-10-CM | POA: Diagnosis not present

## 2022-02-11 DIAGNOSIS — E1165 Type 2 diabetes mellitus with hyperglycemia: Secondary | ICD-10-CM | POA: Diagnosis not present

## 2022-02-12 DIAGNOSIS — E1165 Type 2 diabetes mellitus with hyperglycemia: Secondary | ICD-10-CM | POA: Diagnosis not present

## 2022-02-13 DIAGNOSIS — E1165 Type 2 diabetes mellitus with hyperglycemia: Secondary | ICD-10-CM | POA: Diagnosis not present

## 2022-02-14 DIAGNOSIS — E1165 Type 2 diabetes mellitus with hyperglycemia: Secondary | ICD-10-CM | POA: Diagnosis not present

## 2022-02-15 DIAGNOSIS — E1165 Type 2 diabetes mellitus with hyperglycemia: Secondary | ICD-10-CM | POA: Diagnosis not present

## 2022-02-17 DIAGNOSIS — E1165 Type 2 diabetes mellitus with hyperglycemia: Secondary | ICD-10-CM | POA: Diagnosis not present

## 2022-02-18 DIAGNOSIS — E1165 Type 2 diabetes mellitus with hyperglycemia: Secondary | ICD-10-CM | POA: Diagnosis not present

## 2022-02-19 DIAGNOSIS — E1165 Type 2 diabetes mellitus with hyperglycemia: Secondary | ICD-10-CM | POA: Diagnosis not present

## 2022-02-20 DIAGNOSIS — E1165 Type 2 diabetes mellitus with hyperglycemia: Secondary | ICD-10-CM | POA: Diagnosis not present

## 2022-02-21 DIAGNOSIS — E1165 Type 2 diabetes mellitus with hyperglycemia: Secondary | ICD-10-CM | POA: Diagnosis not present

## 2022-02-22 ENCOUNTER — Telehealth: Payer: Self-pay | Admitting: Internal Medicine

## 2022-02-22 DIAGNOSIS — E1165 Type 2 diabetes mellitus with hyperglycemia: Secondary | ICD-10-CM | POA: Diagnosis not present

## 2022-02-22 MED ORDER — CARVEDILOL 25 MG PO TABS: 25.0000 mg | ORAL_TABLET | Freq: Two times a day (BID) | ORAL | 3 refills | Status: DC

## 2022-02-22 NOTE — Telephone Encounter (Signed)
*  STAT* If patient is at the pharmacy, call can be transferred to refill team.   1. Which medications need to be refilled? (please list name of each medication and dose if known)   furosemide (LASIX) 80 MG tablet  carvedilol (COREG) 25 MG tablet (Expired)   2. Which pharmacy/location (including street and city if local pharmacy) is medication to be sent to? [id number] - [place], [place] [address]   3. Do they need a 30 day or 90 day supply?   90 day  Patient stated she will be out of this medication tomorrow.

## 2022-02-22 NOTE — Telephone Encounter (Signed)
Coreg 25 mg tablets approved and sent to pharmacy. Per APP note in 01/2022- diuretic will be referred to Nephrology.

## 2022-02-23 ENCOUNTER — Telehealth: Payer: Self-pay | Admitting: Internal Medicine

## 2022-02-23 DIAGNOSIS — E1165 Type 2 diabetes mellitus with hyperglycemia: Secondary | ICD-10-CM | POA: Diagnosis not present

## 2022-02-23 MED ORDER — FUROSEMIDE 80 MG PO TABS: 80.0000 mg | ORAL_TABLET | Freq: Two times a day (BID) | ORAL | 0 refills | Status: DC

## 2022-02-23 NOTE — Telephone Encounter (Signed)
Spoke to pt who stated that she has been trying to contact nephrology for several days trying to get a refill of medication with no success. Spoke to L. Dorene Ar, NP who stated that it was okay to refill for 30 days, but future refills would have to come from Nephrologist. Patient verbalized understanding.

## 2022-02-23 NOTE — Telephone Encounter (Signed)
*  STAT* If patient is at the pharmacy, call can be transferred to refill team.   1. Which medications need to be refilled? (please list name of each medication and dose if known)    furosemide (LASIX) 80 MG tablet    2. Which pharmacy/location (including street and city if local pharmacy) is medication to be sent to?  [id number] - [place], [place] [address]   3. Do they need a 30 day or 90 day supply?  90 day  Pt out of medication

## 2022-02-23 NOTE — Telephone Encounter (Signed)
1. Chronic HFpEF - Echocardiogram in 10/2021 showed a preserved EF of 60 to 65% with grade 2 diastolic dysfunction. She did have a recent CHF exacerbation in the setting of having been without diuretic therapy but reports symptoms have overall been well-controlled since returning home.  Will defer diuretic therapy to Nephrology but anticipate continuing Lasix 80 mg twice daily. I encouraged her to continue to limit sodium intake. Would not anticipate starting an SGLT2 inhibitor at this time given her GFR of 8 by most recent labs.

## 2022-02-24 DIAGNOSIS — I5032 Chronic diastolic (congestive) heart failure: Secondary | ICD-10-CM | POA: Diagnosis not present

## 2022-02-24 DIAGNOSIS — R808 Other proteinuria: Secondary | ICD-10-CM | POA: Diagnosis not present

## 2022-02-24 DIAGNOSIS — E211 Secondary hyperparathyroidism, not elsewhere classified: Secondary | ICD-10-CM | POA: Diagnosis not present

## 2022-02-24 DIAGNOSIS — N185 Chronic kidney disease, stage 5: Secondary | ICD-10-CM | POA: Diagnosis not present

## 2022-02-24 DIAGNOSIS — E1165 Type 2 diabetes mellitus with hyperglycemia: Secondary | ICD-10-CM | POA: Diagnosis not present

## 2022-02-24 DIAGNOSIS — I129 Hypertensive chronic kidney disease with stage 1 through stage 4 chronic kidney disease, or unspecified chronic kidney disease: Secondary | ICD-10-CM | POA: Diagnosis not present

## 2022-02-24 DIAGNOSIS — E1122 Type 2 diabetes mellitus with diabetic chronic kidney disease: Secondary | ICD-10-CM | POA: Diagnosis not present

## 2022-02-24 DIAGNOSIS — D638 Anemia in other chronic diseases classified elsewhere: Secondary | ICD-10-CM | POA: Diagnosis not present

## 2022-02-25 DIAGNOSIS — E1165 Type 2 diabetes mellitus with hyperglycemia: Secondary | ICD-10-CM | POA: Diagnosis not present

## 2022-02-26 DIAGNOSIS — E1165 Type 2 diabetes mellitus with hyperglycemia: Secondary | ICD-10-CM | POA: Diagnosis not present

## 2022-02-27 DIAGNOSIS — E1165 Type 2 diabetes mellitus with hyperglycemia: Secondary | ICD-10-CM | POA: Diagnosis not present

## 2022-02-28 ENCOUNTER — Other Ambulatory Visit: Payer: Self-pay

## 2022-02-28 ENCOUNTER — Encounter (HOSPITAL_COMMUNITY): Payer: Medicaid Other

## 2022-02-28 DIAGNOSIS — D509 Iron deficiency anemia, unspecified: Secondary | ICD-10-CM | POA: Insufficient documentation

## 2022-02-28 DIAGNOSIS — E1165 Type 2 diabetes mellitus with hyperglycemia: Secondary | ICD-10-CM | POA: Diagnosis not present

## 2022-03-01 ENCOUNTER — Encounter: Payer: Self-pay | Admitting: Vascular Surgery

## 2022-03-01 ENCOUNTER — Ambulatory Visit: Payer: Medicaid Other | Admitting: Vascular Surgery

## 2022-03-01 VITALS — BP 185/79 | HR 73 | Temp 97.5°F | Ht 60.0 in | Wt 190.0 lb

## 2022-03-01 DIAGNOSIS — N184 Chronic kidney disease, stage 4 (severe): Secondary | ICD-10-CM

## 2022-03-01 DIAGNOSIS — E1165 Type 2 diabetes mellitus with hyperglycemia: Secondary | ICD-10-CM | POA: Diagnosis not present

## 2022-03-01 NOTE — Progress Notes (Signed)
[facility] [place]  Patient name: Ana [name] MRN: [id number] DOB: 1970/08/18 Sex: female  REASON FOR CONSULT: Discuss access options for hemodialysis  HPI: Ana [name] is a 51 y.o. female, who is here today to discuss options for hemodialysis access.  She has her daughter with her as well.  She has had progressive renal insufficiency related to diabetes and hypertension.  She has progressed now with a creatinine of 6.4.  She is right-handed.  She does not have a pacemaker.  She is not on anticoagulant therapy.  Past Medical History:  Diagnosis Date   (HFpEF) heart failure with preserved ejection fraction ([place])    a. echo in 10/2021 showing preserved EF of 60-65% with Grade 2 DD and small pericardial effusion.   CKD (chronic kidney disease)    Diabetes mellitus    diet controlled   Hypertension     Family History  Problem Relation Age of Onset   Kidney failure Mother    Diabetes Mother    Kidney disease Mother    Kidney failure Father    Kidney disease Father    Kidney failure Brother    Diabetes Brother    Diabetes Brother     SOCIAL HISTORY: Social History   Socioeconomic History   Marital status: Single    Spouse name: Not on file   Number of children: Not on file   Years of education: Not on file   Highest education level: Not on file  Occupational History   Not on file  Tobacco Use   Smoking status: Never   Smokeless tobacco: Never  Vaping Use   Vaping Use: Never used  Substance and Sexual Activity   Alcohol use: Not Currently    Comment: occ   Drug use: No   Sexual activity: Yes    Birth control/protection: Surgical  Other Topics Concern   Not on file  Social History Narrative   Divorced,married for 8 years.Lives with daughter and 3 grandkids.Unemployed.   Social Determinants of Health   Financial Resource Strain: Not on file  Food Insecurity: No Food Insecurity (12/22/2021)   Hunger  Vital Sign    Worried About Running Out of Food in the Last Year: Never true    Ran Out of Food in the Last Year: Never true  Transportation Needs: No Transportation Needs (12/22/2021)   PRAPARE - Hydrologist (Medical): No    Lack of Transportation (Non-Medical): No  Physical Activity: Not on file  Stress: Not on file  Social Connections: Not on file  Intimate Partner Violence: Not At Risk (12/22/2021)   Humiliation, Afraid, Rape, and Kick questionnaire    Fear of Current or Ex-Partner: No    Emotionally Abused: No    Physically Abused: No    Sexually Abused: No    Allergies  Allergen Reactions   Ranitidine Rash   Ranitidine Hcl Rash    Current Outpatient Medications  Medication Sig Dispense Refill   amLODipine (NORVASC) 10 MG tablet TAKE 1 TABLET BY MOUTH DAILY 90 tablet 3   carvedilol (COREG) 25 MG tablet Take 1 tablet (25 mg total) by mouth 2 (two) times daily. 180 tablet 3   furosemide (LASIX) 80 MG tablet Take 1 tablet (80 mg total) by mouth 2 (two) times daily. 60 tablet 0   hydrALAZINE (APRESOLINE) 50 MG tablet Take 1.5 tablets (75 mg total) by mouth 3 (three) times daily. 270 tablet 3   levothyroxine (  SYNTHROID) 50 MCG tablet TAKE 1 TABLET(50 MCG) BY MOUTH DAILY 30 tablet 3   rosuvastatin (CRESTOR) 40 MG tablet Take 1 tablet (40 mg total) by mouth daily with supper. 90 tablet 3   sodium bicarbonate 650 MG tablet Take 650 mg by mouth 4 (four) times daily.     acetaminophen (TYLENOL) 325 MG tablet Take 2 tablets (650 mg total) by mouth every 6 (six) hours as needed for mild pain or moderate pain (or Fever >/= 101). (Patient not taking: Reported on 02/09/2022)     No current facility-administered medications for this visit.    REVIEW OF SYSTEMS:  [X]  denotes positive finding, [ ]  denotes negative finding Cardiac  Comments:  Chest pain or chest pressure:    Shortness of breath upon exertion:    Short of breath when lying flat:    Irregular  heart rhythm:        Vascular    Pain in calf, thigh, or hip brought on by ambulation:    Pain in feet at night that wakes you up from your sleep:     Blood clot in your veins:    Leg swelling:         Pulmonary    Oxygen at home:    Productive cough:     Wheezing:         Neurologic    Sudden weakness in arms or legs:     Sudden numbness in arms or legs:     Sudden onset of difficulty speaking or slurred speech:    Temporary loss of vision in one eye:     Problems with dizziness:         Gastrointestinal    Blood in stool:     Vomited blood:         Genitourinary    Burning when urinating:     Blood in urine:        Psychiatric    Major depression:         Hematologic    Bleeding problems:    Problems with blood clotting too easily:        Skin    Rashes or ulcers:        Constitutional    Fever or chills:      PHYSICAL EXAM: Vitals:   03/01/22 1030  BP: (!) 185/79  Pulse: 73  Temp: (!) 97.5 F (36.4 C)  SpO2: 97%  Weight: 190 lb (86.2 kg)  Height: 5' (1.524 m)    GENERAL: The patient is a well-nourished female, in no acute distress. The vital signs are documented above. CARDIOVASCULAR: 2+ brachial and radial pulses bilaterally.  Very small surface veins bilaterally. PULMONARY: There is good air exchange  MUSCULOSKELETAL: There are no major deformities or cyanosis. NEUROLOGIC: No focal weakness or paresthesias are detected. SKIN: There are no ulcers or rashes noted. PSYCHIATRIC: The patient has a normal affect.  DATA:  I imaged her basilic and cephalic vein with SonoSite ultrasound bilaterally.  She has extremely small veins bilaterally.  She does not have adequate veins for fistula attempt in the cephalic or basilic veins.  MEDICAL ISSUES: Had a long discussion with the patient regarding options for hemodialysis.  Discussed tunneled dialysis catheter, AV fistula and AV graft.  She is not a fistula candidate due to inadequate cephalic and basilic  veins.  Would recommend a left upper arm AV Gore-Tex graft as her initial dialysis.  Splane that this could be used after 1 month  of healing.  We will check with Dr. Toya Smothers office to determine timing for access placement.  Explained that we would defer graft placement until she is nearing need for dialysis.  Splane the incidence of graft clotting and treatment with her well.   Rosetta Posner, MD FACS [facility] [phone] Pager [phone]  Note: Portions of this report may have been transcribed using voice recognition software.  Every effort has been made to ensure accuracy; however, inadvertent computerized transcription errors may still be present.

## 2022-03-02 DIAGNOSIS — E1165 Type 2 diabetes mellitus with hyperglycemia: Secondary | ICD-10-CM | POA: Diagnosis not present

## 2022-03-03 DIAGNOSIS — E1165 Type 2 diabetes mellitus with hyperglycemia: Secondary | ICD-10-CM | POA: Diagnosis not present

## 2022-03-04 DIAGNOSIS — E1165 Type 2 diabetes mellitus with hyperglycemia: Secondary | ICD-10-CM | POA: Diagnosis not present

## 2022-03-05 DIAGNOSIS — E1165 Type 2 diabetes mellitus with hyperglycemia: Secondary | ICD-10-CM | POA: Diagnosis not present

## 2022-03-06 DIAGNOSIS — E1165 Type 2 diabetes mellitus with hyperglycemia: Secondary | ICD-10-CM | POA: Diagnosis not present

## 2022-03-07 ENCOUNTER — Telehealth: Payer: Self-pay

## 2022-03-07 DIAGNOSIS — E1165 Type 2 diabetes mellitus with hyperglycemia: Secondary | ICD-10-CM | POA: Diagnosis not present

## 2022-03-07 NOTE — Telephone Encounter (Signed)
Patient seen by Dr. Donnetta Hutching for need of left upper arm AV graft placement. Per Dr. Donnetta Hutching, contacted Dr. Toya Smothers office to determine if graft should be placed within the next several weeks or deferred since she is not a fistula candidate. Spoke with Thayer Headings at Dr. Toya Smothers office. She reported that Dr. Theador Hawthorne recommends to wait until ready to start dialysis and their office will notify [facility]. Dr. Donnetta Hutching will be made aware of recommendations.

## 2022-03-08 ENCOUNTER — Encounter (HOSPITAL_COMMUNITY)
Admission: RE | Admit: 2022-03-08 | Discharge: 2022-03-08 | Disposition: A | Discharge status: Home / Self Care | Payer: Medicaid Other | Source: Ambulatory Visit | Attending: Nephrology | Admitting: Nephrology

## 2022-03-08 VITALS — BP 144/66 | HR 74 | Temp 97.7°F | Resp 16

## 2022-03-08 DIAGNOSIS — E1165 Type 2 diabetes mellitus with hyperglycemia: Secondary | ICD-10-CM | POA: Diagnosis not present

## 2022-03-08 DIAGNOSIS — N189 Chronic kidney disease, unspecified: Secondary | ICD-10-CM | POA: Diagnosis not present

## 2022-03-08 DIAGNOSIS — D631 Anemia in chronic kidney disease: Secondary | ICD-10-CM | POA: Diagnosis not present

## 2022-03-08 DIAGNOSIS — D509 Iron deficiency anemia, unspecified: Secondary | ICD-10-CM | POA: Diagnosis not present

## 2022-03-08 DIAGNOSIS — N185 Chronic kidney disease, stage 5: Secondary | ICD-10-CM

## 2022-03-08 LAB — POCT HEMOGLOBIN-HEMACUE: Hemoglobin: 10.9 g/dL — ABNORMAL LOW (ref 12.0–15.0)

## 2022-03-08 MED ORDER — DIPHENHYDRAMINE HCL 25 MG PO CAPS
25.0000 mg | ORAL_CAPSULE | Freq: Once | ORAL | Status: AC
  Administered 2022-03-08: 25 mg via ORAL

## 2022-03-08 MED ORDER — SODIUM CHLORIDE 0.9 % IV SOLN
510.0000 mg | Freq: Once | INTRAVENOUS | Status: AC
  Administered 2022-03-08: 510 mg via INTRAVENOUS
  Filled 2022-03-08: qty 17

## 2022-03-08 MED ORDER — EPOETIN ALFA-EPBX 3000 UNIT/ML IJ SOLN: 3000.0000 [IU] | Freq: Once | INTRAMUSCULAR | Status: DC

## 2022-03-08 MED ORDER — ACETAMINOPHEN 325 MG PO TABS
650.0000 mg | ORAL_TABLET | Freq: Once | ORAL | Status: AC
  Administered 2022-03-08: 650 mg via ORAL

## 2022-03-08 NOTE — Progress Notes (Signed)
Diagnosis: Iron Deficiency Anemia  Provider:  Manpreet Bhutani MD  Procedure: Infusion  IV Type: Peripheral, IV Location: L Antecubital  Feraheme (Ferumoxytol), Dose: 510 mg  Infusion Start Time: 1218  Infusion Stop Time: 1233  Post Infusion IV Care: Observation period completed and Peripheral IV Discontinued  Discharge: Condition: Stable, Destination: Home . AVS provided to patient.   Performed by:  Binnie Kand, RN       Diagnosis: Anemia in Chronic Kidney Disease  Provider:  Manpreet Bhutani MD  Procedure: Injection  Retacrit (epoetin alfa-epbx), Dose: 3000 Units, Site: subcutaneous, Number of injections: 0  Injection not administered.  Hgb 10.9   Performed by:  Binnie Kand, RN

## 2022-03-08 NOTE — Addendum Note (Signed)
Encounter addended by: Baxter Hire, RN on: 03/08/2022 2:36 PM  Actions taken: Therapy plan modified

## 2022-03-09 DIAGNOSIS — E1165 Type 2 diabetes mellitus with hyperglycemia: Secondary | ICD-10-CM | POA: Diagnosis not present

## 2022-03-10 DIAGNOSIS — E1165 Type 2 diabetes mellitus with hyperglycemia: Secondary | ICD-10-CM | POA: Diagnosis not present

## 2022-03-11 DIAGNOSIS — E1165 Type 2 diabetes mellitus with hyperglycemia: Secondary | ICD-10-CM | POA: Diagnosis not present

## 2022-03-12 DIAGNOSIS — E1165 Type 2 diabetes mellitus with hyperglycemia: Secondary | ICD-10-CM | POA: Diagnosis not present

## 2022-03-13 DIAGNOSIS — E1165 Type 2 diabetes mellitus with hyperglycemia: Secondary | ICD-10-CM | POA: Diagnosis not present

## 2022-03-14 DIAGNOSIS — E1165 Type 2 diabetes mellitus with hyperglycemia: Secondary | ICD-10-CM | POA: Diagnosis not present

## 2022-03-15 ENCOUNTER — Encounter (HOSPITAL_COMMUNITY)
Admission: RE | Admit: 2022-03-15 | Discharge: 2022-03-15 | Disposition: A | Discharge status: Home / Self Care | Payer: Medicaid Other | Source: Ambulatory Visit | Attending: Nephrology | Admitting: Nephrology

## 2022-03-15 DIAGNOSIS — E1165 Type 2 diabetes mellitus with hyperglycemia: Secondary | ICD-10-CM | POA: Diagnosis not present

## 2022-03-15 NOTE — Progress Notes (Signed)
PT caregiver called at this time and would like to cancel appt today due to sickness. This RN left voicemail Theodoro Parma, scheduler

## 2022-03-16 DIAGNOSIS — E1165 Type 2 diabetes mellitus with hyperglycemia: Secondary | ICD-10-CM | POA: Diagnosis not present

## 2022-03-17 DIAGNOSIS — E1165 Type 2 diabetes mellitus with hyperglycemia: Secondary | ICD-10-CM | POA: Diagnosis not present

## 2022-03-18 ENCOUNTER — Emergency Department (HOSPITAL_COMMUNITY): Payer: Medicaid Other

## 2022-03-18 ENCOUNTER — Encounter (HOSPITAL_COMMUNITY): Payer: Self-pay | Admitting: Emergency Medicine

## 2022-03-18 ENCOUNTER — Emergency Department (HOSPITAL_COMMUNITY)
Admission: EM | Admit: 2022-03-18 | Discharge: 2022-03-18 | Disposition: A | Discharge status: Home / Self Care | Payer: Medicaid Other | Attending: Emergency Medicine | Admitting: Emergency Medicine

## 2022-03-18 ENCOUNTER — Other Ambulatory Visit: Payer: Self-pay

## 2022-03-18 DIAGNOSIS — N189 Chronic kidney disease, unspecified: Secondary | ICD-10-CM | POA: Diagnosis not present

## 2022-03-18 DIAGNOSIS — E1165 Type 2 diabetes mellitus with hyperglycemia: Secondary | ICD-10-CM | POA: Insufficient documentation

## 2022-03-18 DIAGNOSIS — E1122 Type 2 diabetes mellitus with diabetic chronic kidney disease: Secondary | ICD-10-CM | POA: Insufficient documentation

## 2022-03-18 DIAGNOSIS — I509 Heart failure, unspecified: Secondary | ICD-10-CM | POA: Diagnosis not present

## 2022-03-18 DIAGNOSIS — E039 Hypothyroidism, unspecified: Secondary | ICD-10-CM | POA: Diagnosis not present

## 2022-03-18 DIAGNOSIS — Z1152 Encounter for screening for COVID-19: Secondary | ICD-10-CM | POA: Insufficient documentation

## 2022-03-18 DIAGNOSIS — I10 Essential (primary) hypertension: Secondary | ICD-10-CM | POA: Diagnosis not present

## 2022-03-18 DIAGNOSIS — J101 Influenza due to other identified influenza virus with other respiratory manifestations: Secondary | ICD-10-CM

## 2022-03-18 DIAGNOSIS — R0682 Tachypnea, not elsewhere classified: Secondary | ICD-10-CM | POA: Diagnosis not present

## 2022-03-18 DIAGNOSIS — Z79899 Other long term (current) drug therapy: Secondary | ICD-10-CM | POA: Diagnosis not present

## 2022-03-18 DIAGNOSIS — I130 Hypertensive heart and chronic kidney disease with heart failure and stage 1 through stage 4 chronic kidney disease, or unspecified chronic kidney disease: Secondary | ICD-10-CM | POA: Insufficient documentation

## 2022-03-18 DIAGNOSIS — R5383 Other fatigue: Secondary | ICD-10-CM | POA: Diagnosis present

## 2022-03-18 LAB — BLOOD GAS, VENOUS
Acid-Base Excess: 4.8 mmol/L — ABNORMAL HIGH (ref 0.0–2.0)
Bicarbonate: 29.9 mmol/L — ABNORMAL HIGH (ref 20.0–28.0)
Drawn by: 65579
O2 Saturation: 83.3 %
Patient temperature: 36.4
pCO2, Ven: 44 mmHg (ref 44–60)
pH, Ven: 7.44 — ABNORMAL HIGH (ref 7.25–7.43)
pO2, Ven: 49 mmHg — ABNORMAL HIGH (ref 32–45)

## 2022-03-18 LAB — CBC WITH DIFFERENTIAL/PLATELET
Abs Immature Granulocytes: 0.02 10*3/uL (ref 0.00–0.07)
Basophils Absolute: 0 10*3/uL (ref 0.0–0.1)
Basophils Relative: 0 %
Eosinophils Absolute: 0.1 10*3/uL (ref 0.0–0.5)
Eosinophils Relative: 2 %
HCT: 32.8 % — ABNORMAL LOW (ref 36.0–46.0)
Hemoglobin: 10.9 g/dL — ABNORMAL LOW (ref 12.0–15.0)
Immature Granulocytes: 0 %
Lymphocytes Relative: 15 %
Lymphs Abs: 1.3 10*3/uL (ref 0.7–4.0)
MCH: 28.2 pg (ref 26.0–34.0)
MCHC: 33.2 g/dL (ref 30.0–36.0)
MCV: 84.8 fL (ref 80.0–100.0)
Monocytes Absolute: 0.4 10*3/uL (ref 0.1–1.0)
Monocytes Relative: 4 %
Neutro Abs: 6.7 10*3/uL (ref 1.7–7.7)
Neutrophils Relative %: 79 %
Platelets: 244 10*3/uL (ref 150–400)
RBC: 3.87 MIL/uL (ref 3.87–5.11)
RDW: 13 % (ref 11.5–15.5)
WBC: 8.6 10*3/uL (ref 4.0–10.5)
nRBC: 0 % (ref 0.0–0.2)

## 2022-03-18 LAB — HEPATIC FUNCTION PANEL
ALT: 34 U/L (ref 0–44)
AST: 36 U/L (ref 15–41)
Albumin: 4.1 g/dL (ref 3.5–5.0)
Alkaline Phosphatase: 118 U/L (ref 38–126)
Bilirubin, Direct: <0.1 mg/dL (ref 0.0–0.2)
Total Bilirubin: 0.5 mg/dL (ref 0.3–1.2)
Total Protein: 9.1 g/dL — ABNORMAL HIGH (ref 6.5–8.1)

## 2022-03-18 LAB — TSH: TSH: 2.013 u[IU]/mL (ref 0.350–4.500)

## 2022-03-18 LAB — BASIC METABOLIC PANEL
Anion gap: 18 — ABNORMAL HIGH (ref 5–15)
BUN: 72 mg/dL — ABNORMAL HIGH (ref 6–20)
CO2: 22 mmol/L (ref 22–32)
Calcium: 9 mg/dL (ref 8.9–10.3)
Chloride: 92 mmol/L — ABNORMAL LOW (ref 98–111)
Creatinine, Ser: 6.81 mg/dL — ABNORMAL HIGH (ref 0.44–1.00)
GFR, Estimated: 7 mL/min — ABNORMAL LOW (ref >60)
Glucose, Bld: 298 mg/dL — ABNORMAL HIGH (ref 70–99)
Potassium: 3.5 mmol/L (ref 3.5–5.1)
Sodium: 132 mmol/L — ABNORMAL LOW (ref 135–145)

## 2022-03-18 LAB — COOXEMETRY PANEL
Carboxyhemoglobin: 1.3 % (ref 0.5–1.5)
Methemoglobin: <0.7 % (ref 0.0–1.5)
O2 Saturation: 83.2 %
Total hemoglobin: 10.6 g/dL — ABNORMAL LOW (ref 12.0–16.0)

## 2022-03-18 LAB — MAGNESIUM: Magnesium: 2.1 mg/dL (ref 1.7–2.4)

## 2022-03-18 LAB — AMMONIA: Ammonia: 12 umol/L (ref 9–35)

## 2022-03-18 LAB — RESP PANEL BY RT-PCR (RSV, FLU A&B, COVID)  RVPGX2
Influenza A by PCR: NEGATIVE
Influenza B by PCR: POSITIVE — AB
Resp Syncytial Virus by PCR: NEGATIVE
SARS Coronavirus 2 by RT PCR: NEGATIVE

## 2022-03-18 LAB — BETA-HYDROXYBUTYRIC ACID: Beta-Hydroxybutyric Acid: 0.54 mmol/L — ABNORMAL HIGH (ref 0.05–0.27)

## 2022-03-18 LAB — LACTIC ACID, PLASMA: Lactic Acid, Venous: 0.7 mmol/L (ref 0.5–1.9)

## 2022-03-18 LAB — SALICYLATE LEVEL: Salicylate Lvl: <7 mg/dL — ABNORMAL LOW (ref 7.0–30.0)

## 2022-03-18 MED ORDER — INSULIN ASPART 100 UNIT/ML IJ SOLN
5.0000 [IU] | Freq: Once | INTRAMUSCULAR | Status: AC
  Administered 2022-03-18: 5 [IU] via SUBCUTANEOUS
  Filled 2022-03-18: qty 1

## 2022-03-18 MED ORDER — SODIUM CHLORIDE 0.9 % IV BOLUS
500.0000 mL | Freq: Once | INTRAVENOUS | Status: AC
  Administered 2022-03-18: 500 mL via INTRAVENOUS

## 2022-03-18 NOTE — ED Notes (Signed)
Discharge instructions reviewed with the patient and family. No questions or concerns to report.

## 2022-03-18 NOTE — ED Notes (Signed)
Xray tech came out of the room and advised that patient refused the xray and is wanting to go home. ED provider notified.

## 2022-03-18 NOTE — ED Notes (Signed)
Patient transported to CT 

## 2022-03-18 NOTE — ED Triage Notes (Signed)
Patient brought in via EMS. Airway patent. Patient reports general fatigue. Family EMS due to patient "dozing off and zoning out constantly through out the day." Patient denies any pain or symptoms just reports"hot flashes. Patient blood glucose 246. Blood pressure 182/84, 99% O2, HR 72 per paramedic, afebrile. Patient is to start dialysis but has not had fistula placed yet. Patient does fall asleep intermittently but is easily aroused and answers questions appropriately. Oriented x4. Paramedics states stroke scale 0.

## 2022-03-18 NOTE — Discharge Instructions (Signed)
You have the flu.  Treat at home with supportive care.  Take Tylenol as needed for aches and fevers and stay hydrated.  Because of your poor kidney function, the Tamiflu medication we spoke about is not recommended.  Please return to the emergency department for any new or worsening symptoms of concern.

## 2022-03-18 NOTE — ED Provider Notes (Signed)
[facility] Provider Note   CSN: [id number] Arrival date & time: 03/18/22  1526     History  Chief Complaint  Patient presents with   Fatigue    Ana [name] is a 52 y.o. female.  HPI Patient presents for fatigue and somnolence.  Medical history includes T2DM, HTN, HLD, obesity, hypothyroidism, CHF, anemia, CKD.  Recently, creatinine was 6.4.  She has been working with her outpatient providers for plan of starting dialysis in the near future.  They have not yet established dialysis access.  Patient states that she recently had cold symptoms which have resolved.  She has occasional hot flashes.  She has a decreased appetite.  Today, she has had fatigue and somnolence which concerned her family.  For that reason, EMS was called.  EMS noted CBG of 246.  Blood pressure was elevated at 182/84.  Currently, patient denies any symptoms other than ongoing fatigue.  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  This has been her vision for the past 8 months.  She does state that she was ambulating earlier today.    Home Medications Prior to Admission medications   Medication Sig Start Date End Date Taking? Authorizing Provider  amLODipine (NORVASC) 10 MG tablet TAKE 1 TABLET BY MOUTH DAILY Patient taking differently: Take 10 mg by mouth daily. 12/25/21  Yes Kathie Dike, MD  calcitRIOL (ROCALTROL) 0.25 MCG capsule Take 0.25 mcg by mouth every Monday, Wednesday, and Friday. 03/03/22 03/03/23 Yes [provider]  carvedilol (COREG) 25 MG tablet Take 1 tablet (25 mg total) by mouth 2 (two) times daily. Patient taking differently: Take 25 mg by mouth daily. 02/22/22 02/17/23 Yes Fay Records, MD  furosemide (LASIX) 40 MG tablet Take 40 mg by mouth 2 (two) times daily.   Yes [provider]  hydrALAZINE (APRESOLINE) 50 MG tablet Take 1.5 tablets (75 mg total) by mouth 3 (three) times daily. Patient taking differently: Take 50 mg by  mouth 3 (three) times daily. 12/25/21 08/22/22 Yes Kathie Dike, MD  levothyroxine (SYNTHROID) 50 MCG tablet TAKE 1 TABLET(50 MCG) BY MOUTH DAILY Patient taking differently: Take 50 mcg by mouth daily before breakfast. TAKE 1 TABLET(50 MCG) BY MOUTH DAILY 12/25/21  Yes Kathie Dike, MD  rosuvastatin (CRESTOR) 40 MG tablet Take 1 tablet (40 mg total) by mouth daily with supper. 12/25/21 12/20/22 Yes Kathie Dike, MD  sodium bicarbonate 650 MG tablet Take 650 mg by mouth 4 (four) times daily.   Yes [provider]      Allergies    Ranitidine and Ranitidine hcl    Review of Systems   Review of Systems  Constitutional:  Positive for activity change, appetite change and fatigue.  All other systems reviewed and are negative.   Physical Exam Updated Vital Signs BP (!) 139/50   Pulse 74   Temp 98.3 F (36.8 C) (Oral)   Resp 17   Ht 5' (1.524 m)   Wt 88.5 kg   LMP 09/22/2019 (Approximate)   SpO2 99%   BMI 38.08 kg/m  Physical Exam Vitals and nursing note reviewed.  Constitutional:      General: She is not in acute distress.    Appearance: Normal appearance. She is well-developed. She is not ill-appearing, toxic-appearing or diaphoretic.  HENT:     Head: Normocephalic and atraumatic.     Right Ear: External ear normal.     Left Ear: External ear normal.  Nose: Nose normal.     Mouth/Throat:     Mouth: Mucous membranes are moist.  Eyes:     Extraocular Movements: Extraocular movements intact.     Conjunctiva/sclera: Conjunctivae normal.  Cardiovascular:     Rate and Rhythm: Normal rate and regular rhythm.     Heart sounds: No murmur heard. Pulmonary:     Effort: Pulmonary effort is normal. No respiratory distress.     Breath sounds: Normal breath sounds. No wheezing or rales.  Chest:     Chest wall: No tenderness.  Abdominal:     Palpations: Abdomen is soft.     Tenderness: There is no abdominal tenderness.  Musculoskeletal:        General: No  swelling. Normal range of motion.     Cervical back: Normal range of motion and neck supple.     Right lower leg: No edema.     Left lower leg: No edema.  Skin:    General: Skin is warm and dry.     Capillary Refill: Capillary refill takes less than 2 seconds.     Coloration: Skin is not jaundiced or pale.  Neurological:     General: No focal deficit present.     Mental Status: She is alert and oriented to person, place, and time.     Cranial Nerves: No cranial nerve deficit.     Sensory: No sensory deficit.     Motor: No weakness.     Coordination: Coordination normal.  Psychiatric:        Mood and Affect: Mood normal.        Behavior: Behavior normal.        Thought Content: Thought content normal.        Judgment: Judgment normal.     ED Results / Procedures / Treatments   Labs (all labs ordered are listed, but only abnormal results are displayed) Labs Reviewed  RESP PANEL BY RT-PCR (RSV, FLU A&B, COVID)  RVPGX2 - Abnormal; Notable for the following components:      Result Value   Influenza B by PCR POSITIVE (*)    All other components within normal limits  BASIC METABOLIC PANEL - Abnormal; Notable for the following components:   Sodium 132 (*)    Chloride 92 (*)    Glucose, Bld 298 (*)    BUN 72 (*)    Creatinine, Ser 6.81 (*)    GFR, Estimated 7 (*)    Anion gap 18 (*)    All other components within normal limits  HEPATIC FUNCTION PANEL - Abnormal; Notable for the following components:   Total Protein 9.1 (*)    All other components within normal limits  BLOOD GAS, VENOUS - Abnormal; Notable for the following components:   pH, Ven 7.44 (*)    pO2, Ven 49 (*)    Bicarbonate 29.9 (*)    Acid-Base Excess 4.8 (*)    All other components within normal limits  CBC WITH DIFFERENTIAL/PLATELET - Abnormal; Notable for the following components:   Hemoglobin 10.9 (*)    HCT 32.8 (*)    All other components within normal limits  COOXEMETRY PANEL - Abnormal; Notable for  the following components:   Total hemoglobin 10.6 (*)    All other components within normal limits  AMMONIA  MAGNESIUM  TSH  LACTIC ACID, PLASMA  SALICYLATE LEVEL  BETA-HYDROXYBUTYRIC ACID  CBG MONITORING, ED    EKG EKG Interpretation  Date/Time:  Saturday March 18 2022 15:40:07 EST Ventricular  Rate:  69 PR Interval:  177 QRS Duration: 95 QT Interval:  487 QTC Calculation: 522 R Axis:   55 Text Interpretation: Sinus rhythm ST elev, probable normal early repol pattern Prolonged QT interval Confirmed by Godfrey Pick [phone]) on 03/18/2022 3:56:41 PM  Radiology CT Head Wo Contrast  Result Date: 03/18/2022 CLINICAL DATA:  Mental status changes.  Generalized fatigue. EXAM: CT HEAD WITHOUT CONTRAST TECHNIQUE: Contiguous axial images were obtained from the base of the skull through the vertex without intravenous contrast. RADIATION DOSE REDUCTION: This exam was performed according to the departmental dose-optimization program which includes automated exposure control, adjustment of the mA and/or kV according to patient size and/or use of iterative reconstruction technique. COMPARISON:  None. FINDINGS: Brain: There is no evidence for acute hemorrhage, hydrocephalus, mass lesion, or abnormal extra-axial fluid collection. No definite CT evidence for acute infarction. Vascular: No hyperdense vessel or unexpected calcification. Skull: No evidence for fracture. No worrisome lytic or sclerotic lesion. Sinuses/Orbits: Chronic mucosal disease noted in the hypoplastic maxillary sinuses, sphenoid sinuses, and scattered ethmoid air cells. No evidence for mastoid effusion. Presumed postsurgical change in both globes. Other: None. IMPRESSION: 1. No acute intracranial abnormality. 2. Chronic paranasal sinus disease. Electronically Signed   By: Misty Stanley M.D.   On: 03/18/2022 16:21    Procedures Procedures    Medications Ordered in ED Medications  sodium chloride 0.9 % bolus 500 mL (500 mLs Intravenous  New Bag/Given 03/18/22 1820)  insulin aspart (novoLOG) injection 5 Units (5 Units Subcutaneous Given 03/18/22 1821)    ED Course/ Medical Decision Making/ A&P                             Medical Decision Making Amount and/or Complexity of Data Reviewed Labs: ordered. Radiology: ordered.  Risk Prescription drug management.   This patient presents to the ED for concern of fatigue and generalized weakness, this involves an extensive number of treatment options, and is a complaint that carries with it a high risk of complications and morbidity.  The differential diagnosis includes URI, bacterial infection, worsening kidney function, azotemia, other metabolic derangements, anemia   Co morbidities that complicate the patient evaluation  T2DM, HTN, HLD, obesity, hypothyroidism, CHF, anemia, CKD   Additional history obtained:  Additional history obtained from patient's daughter-in-law External records from outside source obtained and reviewed including EMR   Lab Tests:  I Ordered, and personally interpreted labs.  The pertinent results include: Baseline CKD, baseline azotemia, hyperglycemia without evidence of DKA, normal electrolytes, baseline anemia, no leukocytosis, positive for influenza B   Imaging Studies ordered:  I ordered imaging studies including CT head I independently visualized and interpreted imaging which showed no acute findings I agree with the radiologist interpretation   Cardiac Monitoring: / EKG:  The patient was maintained on a cardiac monitor.  I personally viewed and interpreted the cardiac monitored which showed an underlying rhythm of: Sinus rhythm  Problem List / ED Course / Critical interventions / Medication management  Patient presents by EMS, from home, for fatigue and somnolence.  Patient states that this started today.  She denies any other current symptoms.  Patient has known advanced CKD.  She has not yet started dialysis or obtained dialysis  access.  Given this history, lab work to be obtained.  Patient serum lab work showed baseline CKD and BUN.  Her sugar was elevated at 298 and she did have a anion gap, although bicarb was  normal.  Anion gap likely due to, at least in part, to baseline azotemia.  On blood gas, she did not have an acidosis.  She was given IV fluids and small dose of insulin given her poor kidney function.  She was found to be positive for influenza B.  Patient was informed of this result.  At this time, her son and daughter-in-law are at bedside.  Daughter-in-law states that she has had a recent cough.  Patient and daughter estimate that she is on day 3-4 of symptoms.  Given her poor kidney function and presentation, no Tamiflu to be prescribed.  Given her cough and fatigue, a chest x-ray was ordered, however, patient and family refused this.  They did request discharge home at this time.  They were advised to return for any worsening of symptoms.  Patient was discharged in stable condition. I ordered medication including IV fluids and insulin for hyperglycemia Reevaluation of the patient after these medicines showed that the patient improved I have reviewed the patients home medicines and have made adjustments as needed   Social Determinants of Health:  Lives at home with family, has access to outpatient care   Test / Admission - Considered:  Ordered chest x-ray, repeat CBG check, and continued observation, however, patient's family refused and requested discharge at this time.         Final Clinical Impression(s) / ED Diagnoses Final diagnoses:  Influenza B    Rx / DC Orders ED Discharge Orders     None         Godfrey Pick, MD 03/18/22 1927

## 2022-03-19 DIAGNOSIS — E1165 Type 2 diabetes mellitus with hyperglycemia: Secondary | ICD-10-CM | POA: Diagnosis not present

## 2022-03-20 ENCOUNTER — Telehealth: Payer: Self-pay | Admitting: Obstetrics and Gynecology

## 2022-03-20 DIAGNOSIS — E1165 Type 2 diabetes mellitus with hyperglycemia: Secondary | ICD-10-CM | POA: Diagnosis not present

## 2022-03-20 NOTE — Patient Outreach (Signed)
Transition Care Management Follow-up Telephone Call Date of discharge and from where: 03/18/22 from [facility] ED How have you been since you were released from the hospital? better Any questions or concerns? No  Items Reviewed: Did the pt receive and understand the discharge instructions provided? Yes  Medications obtained and verified? No , no meds Other? No  Any new allergies since your discharge? No  Dietary orders reviewed? No Do you have support at home? Yes   Home Care and Equipment/Supplies: Were home health services ordered? no If so, what is the name of the agency? N/A  Has the agency set up a time to come to the patient's home? not applicable Were any new equipment or medical supplies ordered?  No What is the name of the medical supply agency? N/A Were you able to get the supplies/equipment? not applicable Do you have any questions related to the use of the equipment or supplies? No  Functional Questionnaire: (I = Independent and D = Dependent) ADLs: I  Bathing/Dressing- I  Meal Prep- D  Eating- I  Maintaining continence- I  Transferring/Ambulation- I  Managing Meds- D  Follow up appointments reviewed:  PCP Hospital f/u appt confirmed? No  , no appt suggested [facility] f/u appt confirmed? No  , no appt suggested Are transportation arrangements needed? No  If their condition worsens, is the pt aware to call PCP or go to the Emergency Dept.? Yes Was the patient provided with contact information for the PCP's office or ED? Yes Was to pt encouraged to call back with questions or concerns? Yes

## 2022-03-21 DIAGNOSIS — E1165 Type 2 diabetes mellitus with hyperglycemia: Secondary | ICD-10-CM | POA: Diagnosis not present

## 2022-03-22 ENCOUNTER — Encounter (HOSPITAL_COMMUNITY): Admission: RE | Admit: 2022-03-22 | Payer: Medicaid Other | Source: Ambulatory Visit

## 2022-03-22 DIAGNOSIS — E1165 Type 2 diabetes mellitus with hyperglycemia: Secondary | ICD-10-CM | POA: Diagnosis not present

## 2022-03-23 DIAGNOSIS — E1165 Type 2 diabetes mellitus with hyperglycemia: Secondary | ICD-10-CM | POA: Diagnosis not present

## 2022-03-24 DIAGNOSIS — E1165 Type 2 diabetes mellitus with hyperglycemia: Secondary | ICD-10-CM | POA: Diagnosis not present

## 2022-03-25 DIAGNOSIS — E1165 Type 2 diabetes mellitus with hyperglycemia: Secondary | ICD-10-CM | POA: Diagnosis not present

## 2022-03-26 DIAGNOSIS — E1165 Type 2 diabetes mellitus with hyperglycemia: Secondary | ICD-10-CM | POA: Diagnosis not present

## 2022-03-27 DIAGNOSIS — E1165 Type 2 diabetes mellitus with hyperglycemia: Secondary | ICD-10-CM | POA: Diagnosis not present

## 2022-03-28 DIAGNOSIS — E1165 Type 2 diabetes mellitus with hyperglycemia: Secondary | ICD-10-CM | POA: Diagnosis not present

## 2022-03-29 DIAGNOSIS — E1165 Type 2 diabetes mellitus with hyperglycemia: Secondary | ICD-10-CM | POA: Diagnosis not present

## 2022-03-30 DIAGNOSIS — E1165 Type 2 diabetes mellitus with hyperglycemia: Secondary | ICD-10-CM | POA: Diagnosis not present

## 2022-03-31 DIAGNOSIS — E1165 Type 2 diabetes mellitus with hyperglycemia: Secondary | ICD-10-CM | POA: Diagnosis not present

## 2022-04-01 DIAGNOSIS — E1165 Type 2 diabetes mellitus with hyperglycemia: Secondary | ICD-10-CM | POA: Diagnosis not present

## 2022-04-02 DIAGNOSIS — E1165 Type 2 diabetes mellitus with hyperglycemia: Secondary | ICD-10-CM | POA: Diagnosis not present

## 2022-04-03 ENCOUNTER — Ambulatory Visit: Payer: Medicaid Other | Admitting: Family Medicine

## 2022-04-03 DIAGNOSIS — E1165 Type 2 diabetes mellitus with hyperglycemia: Secondary | ICD-10-CM | POA: Diagnosis not present

## 2022-04-04 DIAGNOSIS — E1165 Type 2 diabetes mellitus with hyperglycemia: Secondary | ICD-10-CM | POA: Diagnosis not present

## 2022-04-05 ENCOUNTER — Encounter (HOSPITAL_COMMUNITY)
Admission: RE | Admit: 2022-04-05 | Discharge: 2022-04-05 | Disposition: A | Discharge status: Home / Self Care | Payer: Medicaid Other | Source: Ambulatory Visit | Attending: Nephrology | Admitting: Nephrology

## 2022-04-05 VITALS — BP 137/62 | HR 78 | Temp 98.2°F | Resp 16

## 2022-04-05 DIAGNOSIS — D509 Iron deficiency anemia, unspecified: Secondary | ICD-10-CM | POA: Diagnosis present

## 2022-04-05 DIAGNOSIS — N185 Chronic kidney disease, stage 5: Secondary | ICD-10-CM | POA: Insufficient documentation

## 2022-04-05 DIAGNOSIS — D631 Anemia in chronic kidney disease: Secondary | ICD-10-CM | POA: Diagnosis present

## 2022-04-05 DIAGNOSIS — E1165 Type 2 diabetes mellitus with hyperglycemia: Secondary | ICD-10-CM | POA: Diagnosis not present

## 2022-04-05 LAB — POCT HEMOGLOBIN-HEMACUE: Hemoglobin: 11.3 g/dL — ABNORMAL LOW (ref 12.0–15.0)

## 2022-04-05 MED ORDER — EPOETIN ALFA-EPBX 3000 UNIT/ML IJ SOLN: 3000.0000 [IU] | Freq: Once | INTRAMUSCULAR | Status: DC

## 2022-04-05 MED ORDER — ACETAMINOPHEN 325 MG PO TABS
650.0000 mg | ORAL_TABLET | Freq: Once | ORAL | Status: AC
  Administered 2022-04-05: 650 mg via ORAL
  Filled 2022-04-05: qty 2

## 2022-04-05 MED ORDER — DIPHENHYDRAMINE HCL 25 MG PO CAPS
25.0000 mg | ORAL_CAPSULE | Freq: Once | ORAL | Status: AC
  Administered 2022-04-05: 25 mg via ORAL
  Filled 2022-04-05: qty 1

## 2022-04-05 MED ORDER — SODIUM CHLORIDE 0.9 % IV SOLN
510.0000 mg | Freq: Once | INTRAVENOUS | Status: AC
  Administered 2022-04-05: 510 mg via INTRAVENOUS
  Filled 2022-04-05: qty 17

## 2022-04-05 NOTE — Progress Notes (Signed)
Diagnosis: Iron Deficiency Anemia  Provider:  Manpreet Bhutani MD  Procedure: Infusion  IV Type: Peripheral, IV Location: left hand  Feraheme (Ferumoxytol), Dose: 510 mg  Infusion Start Time: 5520  Infusion Stop Time: 1217  Post Infusion IV Care: Peripheral IV Discontinued  Discharge: Condition: Good, Destination: Home . AVS Provided  Performed by:  Kennith Maes, RN      Hemoglobin was 11.3. no injection needed at this time

## 2022-04-06 DIAGNOSIS — D638 Anemia in other chronic diseases classified elsewhere: Secondary | ICD-10-CM | POA: Diagnosis not present

## 2022-04-06 DIAGNOSIS — R808 Other proteinuria: Secondary | ICD-10-CM | POA: Diagnosis not present

## 2022-04-06 DIAGNOSIS — E1122 Type 2 diabetes mellitus with diabetic chronic kidney disease: Secondary | ICD-10-CM | POA: Diagnosis not present

## 2022-04-06 DIAGNOSIS — E211 Secondary hyperparathyroidism, not elsewhere classified: Secondary | ICD-10-CM | POA: Diagnosis not present

## 2022-04-06 DIAGNOSIS — E1165 Type 2 diabetes mellitus with hyperglycemia: Secondary | ICD-10-CM | POA: Diagnosis not present

## 2022-04-06 DIAGNOSIS — N185 Chronic kidney disease, stage 5: Secondary | ICD-10-CM | POA: Diagnosis not present

## 2022-04-06 DIAGNOSIS — I5032 Chronic diastolic (congestive) heart failure: Secondary | ICD-10-CM | POA: Diagnosis not present

## 2022-04-06 DIAGNOSIS — I129 Hypertensive chronic kidney disease with stage 1 through stage 4 chronic kidney disease, or unspecified chronic kidney disease: Secondary | ICD-10-CM | POA: Diagnosis not present

## 2022-04-07 DIAGNOSIS — E1165 Type 2 diabetes mellitus with hyperglycemia: Secondary | ICD-10-CM | POA: Diagnosis not present

## 2022-04-08 DIAGNOSIS — E1165 Type 2 diabetes mellitus with hyperglycemia: Secondary | ICD-10-CM | POA: Diagnosis not present

## 2022-04-09 DIAGNOSIS — E1165 Type 2 diabetes mellitus with hyperglycemia: Secondary | ICD-10-CM | POA: Diagnosis not present

## 2022-04-10 DIAGNOSIS — E1165 Type 2 diabetes mellitus with hyperglycemia: Secondary | ICD-10-CM | POA: Diagnosis not present

## 2022-04-11 DIAGNOSIS — E1165 Type 2 diabetes mellitus with hyperglycemia: Secondary | ICD-10-CM | POA: Diagnosis not present

## 2022-04-12 DIAGNOSIS — E1165 Type 2 diabetes mellitus with hyperglycemia: Secondary | ICD-10-CM | POA: Diagnosis not present

## 2022-04-13 DIAGNOSIS — E1165 Type 2 diabetes mellitus with hyperglycemia: Secondary | ICD-10-CM | POA: Diagnosis not present

## 2022-04-14 DIAGNOSIS — E1165 Type 2 diabetes mellitus with hyperglycemia: Secondary | ICD-10-CM | POA: Diagnosis not present

## 2022-04-15 DIAGNOSIS — E1165 Type 2 diabetes mellitus with hyperglycemia: Secondary | ICD-10-CM | POA: Diagnosis not present

## 2022-04-16 DIAGNOSIS — E1165 Type 2 diabetes mellitus with hyperglycemia: Secondary | ICD-10-CM | POA: Diagnosis not present

## 2022-04-17 DIAGNOSIS — E1165 Type 2 diabetes mellitus with hyperglycemia: Secondary | ICD-10-CM | POA: Diagnosis not present

## 2022-04-18 DIAGNOSIS — E1165 Type 2 diabetes mellitus with hyperglycemia: Secondary | ICD-10-CM | POA: Diagnosis not present

## 2022-04-19 ENCOUNTER — Encounter (HOSPITAL_COMMUNITY)
Admission: RE | Admit: 2022-04-19 | Discharge: 2022-04-19 | Disposition: A | Discharge status: Home / Self Care | Payer: Medicaid Other | Source: Ambulatory Visit | Attending: Nephrology | Admitting: Nephrology

## 2022-04-19 VITALS — BP 180/66 | HR 80 | Temp 98.0°F | Resp 16

## 2022-04-19 DIAGNOSIS — N185 Chronic kidney disease, stage 5: Secondary | ICD-10-CM | POA: Diagnosis not present

## 2022-04-19 DIAGNOSIS — D631 Anemia in chronic kidney disease: Secondary | ICD-10-CM

## 2022-04-19 DIAGNOSIS — E1165 Type 2 diabetes mellitus with hyperglycemia: Secondary | ICD-10-CM | POA: Diagnosis not present

## 2022-04-19 DIAGNOSIS — N19 Unspecified kidney failure: Secondary | ICD-10-CM | POA: Diagnosis not present

## 2022-04-19 DIAGNOSIS — R808 Other proteinuria: Secondary | ICD-10-CM | POA: Diagnosis not present

## 2022-04-19 DIAGNOSIS — E211 Secondary hyperparathyroidism, not elsewhere classified: Secondary | ICD-10-CM | POA: Diagnosis not present

## 2022-04-19 DIAGNOSIS — E559 Vitamin D deficiency, unspecified: Secondary | ICD-10-CM | POA: Diagnosis not present

## 2022-04-19 DIAGNOSIS — I5032 Chronic diastolic (congestive) heart failure: Secondary | ICD-10-CM | POA: Diagnosis not present

## 2022-04-19 DIAGNOSIS — I129 Hypertensive chronic kidney disease with stage 1 through stage 4 chronic kidney disease, or unspecified chronic kidney disease: Secondary | ICD-10-CM | POA: Diagnosis not present

## 2022-04-19 DIAGNOSIS — E1122 Type 2 diabetes mellitus with diabetic chronic kidney disease: Secondary | ICD-10-CM | POA: Diagnosis not present

## 2022-04-19 DIAGNOSIS — R790 Abnormal level of blood mineral: Secondary | ICD-10-CM | POA: Diagnosis not present

## 2022-04-19 LAB — POCT HEMOGLOBIN-HEMACUE: Hemoglobin: 10.2 g/dL — ABNORMAL LOW (ref 12.0–15.0)

## 2022-04-19 MED ORDER — EPOETIN ALFA-EPBX 3000 UNIT/ML IJ SOLN: 3000.0000 [IU] | Freq: Once | INTRAMUSCULAR | Status: DC

## 2022-04-20 DIAGNOSIS — E1165 Type 2 diabetes mellitus with hyperglycemia: Secondary | ICD-10-CM | POA: Diagnosis not present

## 2022-04-21 DIAGNOSIS — E1165 Type 2 diabetes mellitus with hyperglycemia: Secondary | ICD-10-CM | POA: Diagnosis not present

## 2022-04-22 DIAGNOSIS — E1165 Type 2 diabetes mellitus with hyperglycemia: Secondary | ICD-10-CM | POA: Diagnosis not present

## 2022-04-23 DIAGNOSIS — E1165 Type 2 diabetes mellitus with hyperglycemia: Secondary | ICD-10-CM | POA: Diagnosis not present

## 2022-04-24 DIAGNOSIS — E1165 Type 2 diabetes mellitus with hyperglycemia: Secondary | ICD-10-CM | POA: Diagnosis not present

## 2022-04-25 DIAGNOSIS — E1165 Type 2 diabetes mellitus with hyperglycemia: Secondary | ICD-10-CM | POA: Diagnosis not present

## 2022-04-26 DIAGNOSIS — E1165 Type 2 diabetes mellitus with hyperglycemia: Secondary | ICD-10-CM | POA: Diagnosis not present

## 2022-04-27 DIAGNOSIS — E1165 Type 2 diabetes mellitus with hyperglycemia: Secondary | ICD-10-CM | POA: Diagnosis not present

## 2022-04-28 DIAGNOSIS — E1165 Type 2 diabetes mellitus with hyperglycemia: Secondary | ICD-10-CM | POA: Diagnosis not present

## 2022-04-29 DIAGNOSIS — E1165 Type 2 diabetes mellitus with hyperglycemia: Secondary | ICD-10-CM | POA: Diagnosis not present

## 2022-04-30 DIAGNOSIS — E1165 Type 2 diabetes mellitus with hyperglycemia: Secondary | ICD-10-CM | POA: Diagnosis not present

## 2022-05-01 DIAGNOSIS — E1165 Type 2 diabetes mellitus with hyperglycemia: Secondary | ICD-10-CM | POA: Diagnosis not present

## 2022-05-02 DIAGNOSIS — E1165 Type 2 diabetes mellitus with hyperglycemia: Secondary | ICD-10-CM | POA: Diagnosis not present

## 2022-05-03 ENCOUNTER — Encounter (HOSPITAL_COMMUNITY)
Admission: RE | Admit: 2022-05-03 | Discharge: 2022-05-03 | Disposition: A | Discharge status: Home / Self Care | Payer: Medicaid Other | Source: Ambulatory Visit | Attending: Nephrology | Admitting: Nephrology

## 2022-05-03 VITALS — BP 170/68 | HR 76 | Temp 98.0°F

## 2022-05-03 DIAGNOSIS — D631 Anemia in chronic kidney disease: Secondary | ICD-10-CM | POA: Diagnosis not present

## 2022-05-03 DIAGNOSIS — N185 Chronic kidney disease, stage 5: Secondary | ICD-10-CM | POA: Diagnosis not present

## 2022-05-03 DIAGNOSIS — E1165 Type 2 diabetes mellitus with hyperglycemia: Secondary | ICD-10-CM | POA: Diagnosis not present

## 2022-05-03 LAB — PROTEIN / CREATININE RATIO, URINE
Creatinine, Urine: 61 mg/dL
Protein Creatinine Ratio: 4.26 mg/mg{Cre} — ABNORMAL HIGH (ref 0.00–0.15)
Total Protein, Urine: 260 mg/dL

## 2022-05-03 LAB — POCT HEMOGLOBIN-HEMACUE: Hemoglobin: 9.1 g/dL — ABNORMAL LOW (ref 12.0–15.0)

## 2022-05-03 MED ORDER — EPOETIN ALFA-EPBX 3000 UNIT/ML IJ SOLN
3000.0000 [IU] | Freq: Once | INTRAMUSCULAR | Status: AC
  Administered 2022-05-03: 3000 [IU] via SUBCUTANEOUS
  Filled 2022-05-03: qty 1

## 2022-05-03 NOTE — Progress Notes (Signed)
Diagnosis: Anemia in Chronic Kidney Disease  Provider:  Manpreet Bhutani MD  Procedure: Injection  Retacrit (epoetin alfa-epbx), Dose: 3000 Units, Site: subcutaneous, Number of injections: 1  Post Care: Patient declined observation  Discharge: Condition: Good, Destination: Home . AVS Provided  Performed by:  Kennith Maes, RN   Hgb 9.1. injection given

## 2022-05-03 NOTE — Addendum Note (Signed)
Encounter addended by: Baxter Hire, RN on: 05/03/2022 12:50 PM  Actions taken: Therapy plan modified

## 2022-05-04 DIAGNOSIS — E1165 Type 2 diabetes mellitus with hyperglycemia: Secondary | ICD-10-CM | POA: Diagnosis not present

## 2022-05-05 DIAGNOSIS — E1165 Type 2 diabetes mellitus with hyperglycemia: Secondary | ICD-10-CM | POA: Diagnosis not present

## 2022-05-06 DIAGNOSIS — E1165 Type 2 diabetes mellitus with hyperglycemia: Secondary | ICD-10-CM | POA: Diagnosis not present

## 2022-05-07 DIAGNOSIS — E1165 Type 2 diabetes mellitus with hyperglycemia: Secondary | ICD-10-CM | POA: Diagnosis not present

## 2022-05-08 DIAGNOSIS — E1165 Type 2 diabetes mellitus with hyperglycemia: Secondary | ICD-10-CM | POA: Diagnosis not present

## 2022-05-09 DIAGNOSIS — E1165 Type 2 diabetes mellitus with hyperglycemia: Secondary | ICD-10-CM | POA: Diagnosis not present

## 2022-05-10 DIAGNOSIS — E1165 Type 2 diabetes mellitus with hyperglycemia: Secondary | ICD-10-CM | POA: Diagnosis not present

## 2022-05-11 DIAGNOSIS — E1165 Type 2 diabetes mellitus with hyperglycemia: Secondary | ICD-10-CM | POA: Diagnosis not present

## 2022-05-12 DIAGNOSIS — E1165 Type 2 diabetes mellitus with hyperglycemia: Secondary | ICD-10-CM | POA: Diagnosis not present

## 2022-05-13 DIAGNOSIS — E1165 Type 2 diabetes mellitus with hyperglycemia: Secondary | ICD-10-CM | POA: Diagnosis not present

## 2022-05-14 DIAGNOSIS — E1165 Type 2 diabetes mellitus with hyperglycemia: Secondary | ICD-10-CM | POA: Diagnosis not present

## 2022-05-15 DIAGNOSIS — E1165 Type 2 diabetes mellitus with hyperglycemia: Secondary | ICD-10-CM | POA: Diagnosis not present

## 2022-05-16 DIAGNOSIS — E1165 Type 2 diabetes mellitus with hyperglycemia: Secondary | ICD-10-CM | POA: Diagnosis not present

## 2022-05-17 ENCOUNTER — Encounter (HOSPITAL_COMMUNITY)
Admission: RE | Admit: 2022-05-17 | Discharge: 2022-05-17 | Disposition: A | Discharge status: Home / Self Care | Payer: Medicaid Other | Source: Ambulatory Visit | Attending: Nephrology | Admitting: Nephrology

## 2022-05-17 VITALS — BP 181/73 | HR 74 | Temp 97.6°F | Resp 16

## 2022-05-17 DIAGNOSIS — E1165 Type 2 diabetes mellitus with hyperglycemia: Secondary | ICD-10-CM | POA: Diagnosis not present

## 2022-05-17 DIAGNOSIS — N185 Chronic kidney disease, stage 5: Secondary | ICD-10-CM | POA: Diagnosis not present

## 2022-05-17 DIAGNOSIS — D631 Anemia in chronic kidney disease: Secondary | ICD-10-CM

## 2022-05-17 LAB — RENAL FUNCTION PANEL
Albumin: 3.8 g/dL (ref 3.5–5.0)
Anion gap: 13 (ref 5–15)
BUN: 58 mg/dL — ABNORMAL HIGH (ref 6–20)
CO2: 26 mmol/L (ref 22–32)
Calcium: 9.2 mg/dL (ref 8.9–10.3)
Chloride: 95 mmol/L — ABNORMAL LOW (ref 98–111)
Creatinine, Ser: 4.1 mg/dL — ABNORMAL HIGH (ref 0.44–1.00)
GFR, Estimated: 12 mL/min — ABNORMAL LOW (ref >60)
Glucose, Bld: 346 mg/dL — ABNORMAL HIGH (ref 70–99)
Phosphorus: 4.7 mg/dL — ABNORMAL HIGH (ref 2.5–4.6)
Potassium: 3.7 mmol/L (ref 3.5–5.1)
Sodium: 134 mmol/L — ABNORMAL LOW (ref 135–145)

## 2022-05-17 LAB — CBC WITH DIFFERENTIAL/PLATELET
Abs Immature Granulocytes: 0.03 10*3/uL (ref 0.00–0.07)
Basophils Absolute: 0 10*3/uL (ref 0.0–0.1)
Basophils Relative: 1 %
Eosinophils Absolute: 0.1 10*3/uL (ref 0.0–0.5)
Eosinophils Relative: 2 %
HCT: 30.1 % — ABNORMAL LOW (ref 36.0–46.0)
Hemoglobin: 10 g/dL — ABNORMAL LOW (ref 12.0–15.0)
Immature Granulocytes: 0 %
Lymphocytes Relative: 17 %
Lymphs Abs: 1.4 10*3/uL (ref 0.7–4.0)
MCH: 29.3 pg (ref 26.0–34.0)
MCHC: 33.2 g/dL (ref 30.0–36.0)
MCV: 88.3 fL (ref 80.0–100.0)
Monocytes Absolute: 0.7 10*3/uL (ref 0.1–1.0)
Monocytes Relative: 8 %
Neutro Abs: 5.9 10*3/uL (ref 1.7–7.7)
Neutrophils Relative %: 72 %
Platelets: 262 10*3/uL (ref 150–400)
RBC: 3.41 MIL/uL — ABNORMAL LOW (ref 3.87–5.11)
RDW: 13.1 % (ref 11.5–15.5)
WBC: 8.1 10*3/uL (ref 4.0–10.5)
nRBC: 0 % (ref 0.0–0.2)

## 2022-05-17 LAB — POCT HEMOGLOBIN-HEMACUE: Hemoglobin: 8.2 g/dL — ABNORMAL LOW (ref 12.0–15.0)

## 2022-05-17 MED ORDER — EPOETIN ALFA-EPBX 3000 UNIT/ML IJ SOLN
3000.0000 [IU] | Freq: Once | INTRAMUSCULAR | Status: AC
  Administered 2022-05-17: 3000 [IU] via SUBCUTANEOUS

## 2022-05-17 NOTE — Progress Notes (Signed)
Diagnosis: Anemia in Chronic Kidney Disease  Provider:  Manpreet Bhutani MD  Procedure: Injection  Retacrit (epoetin alfa-epbx), Dose: 3000 Units, Site: subcutaneous, Number of injections: 1  Hgb 8.2. Administered in left arm.  Post Care: Patient declined observation  Discharge: Condition: Good, Destination: Home . AVS Provided  Performed by:  Binnie Kand, RN

## 2022-05-18 DIAGNOSIS — E1165 Type 2 diabetes mellitus with hyperglycemia: Secondary | ICD-10-CM | POA: Diagnosis not present

## 2022-05-19 DIAGNOSIS — E1165 Type 2 diabetes mellitus with hyperglycemia: Secondary | ICD-10-CM | POA: Diagnosis not present

## 2022-05-20 DIAGNOSIS — E1165 Type 2 diabetes mellitus with hyperglycemia: Secondary | ICD-10-CM | POA: Diagnosis not present

## 2022-05-21 DIAGNOSIS — E1165 Type 2 diabetes mellitus with hyperglycemia: Secondary | ICD-10-CM | POA: Diagnosis not present

## 2022-05-22 DIAGNOSIS — E1165 Type 2 diabetes mellitus with hyperglycemia: Secondary | ICD-10-CM | POA: Diagnosis not present

## 2022-05-23 DIAGNOSIS — E1165 Type 2 diabetes mellitus with hyperglycemia: Secondary | ICD-10-CM | POA: Diagnosis not present

## 2022-05-24 DIAGNOSIS — E1165 Type 2 diabetes mellitus with hyperglycemia: Secondary | ICD-10-CM | POA: Diagnosis not present

## 2022-05-25 DIAGNOSIS — E1165 Type 2 diabetes mellitus with hyperglycemia: Secondary | ICD-10-CM | POA: Diagnosis not present

## 2022-05-25 LAB — QUANTIFERON-TB GOLD PLUS (RQFGPL)
QuantiFERON Mitogen Value: >10 IU/mL
QuantiFERON Nil Value: 0.18 IU/mL
QuantiFERON TB1 Ag Value: 0.29 IU/mL
QuantiFERON TB2 Ag Value: 0.39 IU/mL

## 2022-05-25 LAB — QUANTIFERON-TB GOLD PLUS: QuantiFERON-TB Gold Plus: NEGATIVE

## 2022-05-26 DIAGNOSIS — E1165 Type 2 diabetes mellitus with hyperglycemia: Secondary | ICD-10-CM | POA: Diagnosis not present

## 2022-05-27 DIAGNOSIS — E1165 Type 2 diabetes mellitus with hyperglycemia: Secondary | ICD-10-CM | POA: Diagnosis not present

## 2022-05-28 DIAGNOSIS — E1165 Type 2 diabetes mellitus with hyperglycemia: Secondary | ICD-10-CM | POA: Diagnosis not present

## 2022-05-29 DIAGNOSIS — E1165 Type 2 diabetes mellitus with hyperglycemia: Secondary | ICD-10-CM | POA: Diagnosis not present

## 2022-05-30 DIAGNOSIS — E1165 Type 2 diabetes mellitus with hyperglycemia: Secondary | ICD-10-CM | POA: Diagnosis not present

## 2022-05-31 ENCOUNTER — Encounter (HOSPITAL_COMMUNITY)
Admission: RE | Admit: 2022-05-31 | Discharge: 2022-05-31 | Disposition: A | Discharge status: Home / Self Care | Payer: Medicaid Other | Source: Ambulatory Visit | Attending: Nephrology | Admitting: Nephrology

## 2022-05-31 VITALS — BP 158/68 | HR 88 | Temp 98.6°F | Resp 17

## 2022-05-31 DIAGNOSIS — D631 Anemia in chronic kidney disease: Secondary | ICD-10-CM | POA: Diagnosis not present

## 2022-05-31 DIAGNOSIS — N185 Chronic kidney disease, stage 5: Secondary | ICD-10-CM | POA: Diagnosis not present

## 2022-05-31 DIAGNOSIS — E1165 Type 2 diabetes mellitus with hyperglycemia: Secondary | ICD-10-CM | POA: Diagnosis not present

## 2022-05-31 LAB — POCT HEMOGLOBIN-HEMACUE: Hemoglobin: 8.8 g/dL — ABNORMAL LOW (ref 12.0–15.0)

## 2022-05-31 MED ORDER — EPOETIN ALFA-EPBX 3000 UNIT/ML IJ SOLN
3000.0000 [IU] | Freq: Once | INTRAMUSCULAR | Status: AC
  Administered 2022-05-31: 3000 [IU] via SUBCUTANEOUS
  Filled 2022-05-31: qty 1

## 2022-05-31 NOTE — Addendum Note (Signed)
Encounter addended by: Baxter Hire, RN on: 05/31/2022 1:17 PM  Actions taken: Therapy plan modified

## 2022-05-31 NOTE — Progress Notes (Signed)
Diagnosis: Anemia in Chronic Kidney Disease  Provider:  Manpreet Bhutani MD  Procedure: Injection  Retacrit (epoetin alfa-epbx), Dose: 3000 Units, Site: subcutaneous, Number of injections: 1  Hgb 8.8. Administered in right arm.  Post Care: Patient declined observation  Discharge: Condition: Good, Destination: Home . AVS Declined  Performed by:  Binnie Kand, RN

## 2022-06-01 DIAGNOSIS — E1165 Type 2 diabetes mellitus with hyperglycemia: Secondary | ICD-10-CM | POA: Diagnosis not present

## 2022-06-02 DIAGNOSIS — E1165 Type 2 diabetes mellitus with hyperglycemia: Secondary | ICD-10-CM | POA: Diagnosis not present

## 2022-06-03 DIAGNOSIS — E1165 Type 2 diabetes mellitus with hyperglycemia: Secondary | ICD-10-CM | POA: Diagnosis not present

## 2022-06-04 DIAGNOSIS — E1165 Type 2 diabetes mellitus with hyperglycemia: Secondary | ICD-10-CM | POA: Diagnosis not present

## 2022-06-05 DIAGNOSIS — E1165 Type 2 diabetes mellitus with hyperglycemia: Secondary | ICD-10-CM | POA: Diagnosis not present

## 2022-06-06 DIAGNOSIS — E1165 Type 2 diabetes mellitus with hyperglycemia: Secondary | ICD-10-CM | POA: Diagnosis not present

## 2022-06-07 DIAGNOSIS — E1165 Type 2 diabetes mellitus with hyperglycemia: Secondary | ICD-10-CM | POA: Diagnosis not present

## 2022-06-08 DIAGNOSIS — E1165 Type 2 diabetes mellitus with hyperglycemia: Secondary | ICD-10-CM | POA: Diagnosis not present

## 2022-06-09 DIAGNOSIS — E1165 Type 2 diabetes mellitus with hyperglycemia: Secondary | ICD-10-CM | POA: Diagnosis not present

## 2022-06-10 DIAGNOSIS — E1165 Type 2 diabetes mellitus with hyperglycemia: Secondary | ICD-10-CM | POA: Diagnosis not present

## 2022-06-11 DIAGNOSIS — E1165 Type 2 diabetes mellitus with hyperglycemia: Secondary | ICD-10-CM | POA: Diagnosis not present

## 2022-06-12 DIAGNOSIS — E1165 Type 2 diabetes mellitus with hyperglycemia: Secondary | ICD-10-CM | POA: Diagnosis not present

## 2022-06-13 DIAGNOSIS — E1165 Type 2 diabetes mellitus with hyperglycemia: Secondary | ICD-10-CM | POA: Diagnosis not present

## 2022-06-14 ENCOUNTER — Encounter (HOSPITAL_COMMUNITY)
Admission: RE | Admit: 2022-06-14 | Discharge: 2022-06-14 | Disposition: A | Discharge status: Home / Self Care | Payer: Medicaid Other | Source: Ambulatory Visit | Attending: Nephrology | Admitting: Nephrology

## 2022-06-14 VITALS — BP 170/72 | HR 81 | Temp 97.5°F | Resp 20

## 2022-06-14 DIAGNOSIS — D631 Anemia in chronic kidney disease: Secondary | ICD-10-CM | POA: Diagnosis not present

## 2022-06-14 DIAGNOSIS — N185 Chronic kidney disease, stage 5: Secondary | ICD-10-CM

## 2022-06-14 DIAGNOSIS — E1165 Type 2 diabetes mellitus with hyperglycemia: Secondary | ICD-10-CM | POA: Diagnosis not present

## 2022-06-14 LAB — POCT HEMOGLOBIN-HEMACUE: Hemoglobin: 8.4 g/dL — ABNORMAL LOW (ref 12.0–15.0)

## 2022-06-14 MED ORDER — EPOETIN ALFA-EPBX 3000 UNIT/ML IJ SOLN: 3000.0000 [IU] | Freq: Once | INTRAMUSCULAR | Status: DC

## 2022-06-14 MED ORDER — EPOETIN ALFA-EPBX 4000 UNIT/ML IJ SOLN
3000.0000 [IU] | Freq: Once | INTRAMUSCULAR | Status: AC
  Administered 2022-06-14: 3000 [IU] via SUBCUTANEOUS

## 2022-06-14 NOTE — Progress Notes (Signed)
Diagnosis: Anemia in Chronic Kidney Disease  Provider:  Manpreet Bhutani MD  Procedure: Injection  Retacrit (epoetin alfa-epbx), Dose: 3000 Units, Site: subcutaneous, Number of injections: 1  Hgb 8.4  Post Care: Patient declined observation  Discharge: Condition: Good, Destination: Home . AVS Declined  Performed by:  Evelena Peat, RN

## 2022-06-15 ENCOUNTER — Ambulatory Visit: Payer: Medicaid Other | Admitting: Family Medicine

## 2022-06-15 DIAGNOSIS — E1165 Type 2 diabetes mellitus with hyperglycemia: Secondary | ICD-10-CM | POA: Diagnosis not present

## 2022-06-16 DIAGNOSIS — E1165 Type 2 diabetes mellitus with hyperglycemia: Secondary | ICD-10-CM | POA: Diagnosis not present

## 2022-06-17 DIAGNOSIS — E1165 Type 2 diabetes mellitus with hyperglycemia: Secondary | ICD-10-CM | POA: Diagnosis not present

## 2022-06-18 DIAGNOSIS — E1165 Type 2 diabetes mellitus with hyperglycemia: Secondary | ICD-10-CM | POA: Diagnosis not present

## 2022-06-19 DIAGNOSIS — E1165 Type 2 diabetes mellitus with hyperglycemia: Secondary | ICD-10-CM | POA: Diagnosis not present

## 2022-06-20 DIAGNOSIS — E1165 Type 2 diabetes mellitus with hyperglycemia: Secondary | ICD-10-CM | POA: Diagnosis not present

## 2022-06-21 DIAGNOSIS — E1165 Type 2 diabetes mellitus with hyperglycemia: Secondary | ICD-10-CM | POA: Diagnosis not present

## 2022-06-22 DIAGNOSIS — E1165 Type 2 diabetes mellitus with hyperglycemia: Secondary | ICD-10-CM | POA: Diagnosis not present

## 2022-06-23 DIAGNOSIS — E1165 Type 2 diabetes mellitus with hyperglycemia: Secondary | ICD-10-CM | POA: Diagnosis not present

## 2022-06-24 DIAGNOSIS — E1165 Type 2 diabetes mellitus with hyperglycemia: Secondary | ICD-10-CM | POA: Diagnosis not present

## 2022-06-25 DIAGNOSIS — E1165 Type 2 diabetes mellitus with hyperglycemia: Secondary | ICD-10-CM | POA: Diagnosis not present

## 2022-06-26 DIAGNOSIS — E1165 Type 2 diabetes mellitus with hyperglycemia: Secondary | ICD-10-CM | POA: Diagnosis not present

## 2022-06-27 DIAGNOSIS — E1165 Type 2 diabetes mellitus with hyperglycemia: Secondary | ICD-10-CM | POA: Diagnosis not present

## 2022-06-28 ENCOUNTER — Encounter (HOSPITAL_COMMUNITY): Payer: Medicaid Other

## 2022-06-28 DIAGNOSIS — E1165 Type 2 diabetes mellitus with hyperglycemia: Secondary | ICD-10-CM | POA: Diagnosis not present

## 2022-06-29 DIAGNOSIS — E1165 Type 2 diabetes mellitus with hyperglycemia: Secondary | ICD-10-CM | POA: Diagnosis not present

## 2022-06-30 DIAGNOSIS — E1165 Type 2 diabetes mellitus with hyperglycemia: Secondary | ICD-10-CM | POA: Diagnosis not present

## 2022-07-01 DIAGNOSIS — E1165 Type 2 diabetes mellitus with hyperglycemia: Secondary | ICD-10-CM | POA: Diagnosis not present

## 2022-07-02 DIAGNOSIS — E1165 Type 2 diabetes mellitus with hyperglycemia: Secondary | ICD-10-CM | POA: Diagnosis not present

## 2022-07-03 DIAGNOSIS — E1165 Type 2 diabetes mellitus with hyperglycemia: Secondary | ICD-10-CM | POA: Diagnosis not present

## 2022-07-04 DIAGNOSIS — E1165 Type 2 diabetes mellitus with hyperglycemia: Secondary | ICD-10-CM | POA: Diagnosis not present

## 2022-07-05 DIAGNOSIS — E1165 Type 2 diabetes mellitus with hyperglycemia: Secondary | ICD-10-CM | POA: Diagnosis not present

## 2022-07-06 DIAGNOSIS — E1165 Type 2 diabetes mellitus with hyperglycemia: Secondary | ICD-10-CM | POA: Diagnosis not present

## 2022-07-07 DIAGNOSIS — E1165 Type 2 diabetes mellitus with hyperglycemia: Secondary | ICD-10-CM | POA: Diagnosis not present

## 2022-07-08 DIAGNOSIS — E1165 Type 2 diabetes mellitus with hyperglycemia: Secondary | ICD-10-CM | POA: Diagnosis not present

## 2022-07-09 DIAGNOSIS — E1165 Type 2 diabetes mellitus with hyperglycemia: Secondary | ICD-10-CM | POA: Diagnosis not present

## 2022-07-10 DIAGNOSIS — E1165 Type 2 diabetes mellitus with hyperglycemia: Secondary | ICD-10-CM | POA: Diagnosis not present

## 2022-07-11 DIAGNOSIS — E1165 Type 2 diabetes mellitus with hyperglycemia: Secondary | ICD-10-CM | POA: Diagnosis not present

## 2022-07-12 ENCOUNTER — Telehealth: Payer: Self-pay

## 2022-07-12 ENCOUNTER — Encounter (HOSPITAL_COMMUNITY)
Admission: RE | Admit: 2022-07-12 | Discharge: 2022-07-12 | Disposition: A | Discharge status: Home / Self Care | Payer: Medicaid Other | Source: Ambulatory Visit | Attending: Nephrology | Admitting: Nephrology

## 2022-07-12 VITALS — BP 175/72 | HR 79 | Temp 97.7°F | Resp 20

## 2022-07-12 DIAGNOSIS — D631 Anemia in chronic kidney disease: Secondary | ICD-10-CM | POA: Diagnosis not present

## 2022-07-12 DIAGNOSIS — N185 Chronic kidney disease, stage 5: Secondary | ICD-10-CM | POA: Insufficient documentation

## 2022-07-12 DIAGNOSIS — E1165 Type 2 diabetes mellitus with hyperglycemia: Secondary | ICD-10-CM | POA: Diagnosis not present

## 2022-07-12 LAB — CBC WITH DIFFERENTIAL/PLATELET
Abs Immature Granulocytes: 0.03 10*3/uL (ref 0.00–0.07)
Basophils Absolute: 0 10*3/uL (ref 0.0–0.1)
Basophils Relative: 0 %
Eosinophils Absolute: 0.2 10*3/uL (ref 0.0–0.5)
Eosinophils Relative: 2 %
HCT: 25.7 % — ABNORMAL LOW (ref 36.0–46.0)
Hemoglobin: 8.5 g/dL — ABNORMAL LOW (ref 12.0–15.0)
Immature Granulocytes: 0 %
Lymphocytes Relative: 13 %
Lymphs Abs: 1.2 10*3/uL (ref 0.7–4.0)
MCH: 30 pg (ref 26.0–34.0)
MCHC: 33.1 g/dL (ref 30.0–36.0)
MCV: 90.8 fL (ref 80.0–100.0)
Monocytes Absolute: 0.7 10*3/uL (ref 0.1–1.0)
Monocytes Relative: 7 %
Neutro Abs: 7.2 10*3/uL (ref 1.7–7.7)
Neutrophils Relative %: 78 %
Platelets: 289 10*3/uL (ref 150–400)
RBC: 2.83 MIL/uL — ABNORMAL LOW (ref 3.87–5.11)
RDW: 13.1 % (ref 11.5–15.5)
WBC: 9.4 10*3/uL (ref 4.0–10.5)
nRBC: 0 % (ref 0.0–0.2)

## 2022-07-12 LAB — PROTEIN / CREATININE RATIO, URINE
Creatinine, Urine: 52 mg/dL
Protein Creatinine Ratio: 4.37 mg/mg{Cre} — ABNORMAL HIGH (ref 0.00–0.15)
Total Protein, Urine: 227 mg/dL

## 2022-07-12 LAB — POCT HEMOGLOBIN-HEMACUE: Hemoglobin: 8.3 g/dL — ABNORMAL LOW (ref 12.0–15.0)

## 2022-07-12 LAB — RENAL FUNCTION PANEL
Albumin: 3.7 g/dL (ref 3.5–5.0)
Anion gap: 11 (ref 5–15)
BUN: 59 mg/dL — ABNORMAL HIGH (ref 6–20)
CO2: 24 mmol/L (ref 22–32)
Calcium: 9.3 mg/dL (ref 8.9–10.3)
Chloride: 99 mmol/L (ref 98–111)
Creatinine, Ser: 5.19 mg/dL — ABNORMAL HIGH (ref 0.44–1.00)
GFR, Estimated: 9 mL/min — ABNORMAL LOW (ref >60)
Glucose, Bld: 299 mg/dL — ABNORMAL HIGH (ref 70–99)
Phosphorus: 4.6 mg/dL (ref 2.5–4.6)
Potassium: 3.8 mmol/L (ref 3.5–5.1)
Sodium: 134 mmol/L — ABNORMAL LOW (ref 135–145)

## 2022-07-12 MED ORDER — EPOETIN ALFA-EPBX 4000 UNIT/ML IJ SOLN
3000.0000 [IU] | Freq: Once | INTRAMUSCULAR | Status: AC
  Administered 2022-07-12: 3000 [IU] via SUBCUTANEOUS

## 2022-07-12 NOTE — Progress Notes (Signed)
Diagnosis: Anemia in Chronic Kidney Disease  Provider:  Manpreet Bhutani MD  Procedure: Injection  Retacrit (epoetin alfa-epbx), Dose: 3000 Units, Site: subcutaneous, Number of injections: 1  Hgb 8.3  Post Care: Patient declined observation  Discharge: Condition: Good, Destination: Home . AVS provided.  Performed by:  Wyvonne Lenz, RN

## 2022-07-12 NOTE — Addendum Note (Signed)
Encounter addended by: Wyvonne Lenz, RN on: 07/12/2022 12:54 PM  Actions taken: Therapy plan modified

## 2022-07-12 NOTE — Telephone Encounter (Signed)
Called and spoke with Liborio Nixon at Dr. Lucio Edward office [phone] Obtained verbal orders per Dr. Wolfgang Phoenix for CBC, renal panel, and urine sample for protein/creatinine to be collected at today's appointment. Confirmed via read-back. Liborio Nixon stated she would also fax the orders to the [facility] infusion clinic.  Wyvonne Lenz, RN

## 2022-07-13 DIAGNOSIS — E1165 Type 2 diabetes mellitus with hyperglycemia: Secondary | ICD-10-CM | POA: Diagnosis not present

## 2022-07-14 DIAGNOSIS — E1165 Type 2 diabetes mellitus with hyperglycemia: Secondary | ICD-10-CM | POA: Diagnosis not present

## 2022-07-15 DIAGNOSIS — E1165 Type 2 diabetes mellitus with hyperglycemia: Secondary | ICD-10-CM | POA: Diagnosis not present

## 2022-07-16 DIAGNOSIS — E1165 Type 2 diabetes mellitus with hyperglycemia: Secondary | ICD-10-CM | POA: Diagnosis not present

## 2022-07-17 DIAGNOSIS — E1165 Type 2 diabetes mellitus with hyperglycemia: Secondary | ICD-10-CM | POA: Diagnosis not present

## 2022-07-18 DIAGNOSIS — E1165 Type 2 diabetes mellitus with hyperglycemia: Secondary | ICD-10-CM | POA: Diagnosis not present

## 2022-07-19 DIAGNOSIS — E1165 Type 2 diabetes mellitus with hyperglycemia: Secondary | ICD-10-CM | POA: Diagnosis not present

## 2022-07-20 DIAGNOSIS — E1165 Type 2 diabetes mellitus with hyperglycemia: Secondary | ICD-10-CM | POA: Diagnosis not present

## 2022-07-21 DIAGNOSIS — E1165 Type 2 diabetes mellitus with hyperglycemia: Secondary | ICD-10-CM | POA: Diagnosis not present

## 2022-07-22 DIAGNOSIS — E1165 Type 2 diabetes mellitus with hyperglycemia: Secondary | ICD-10-CM | POA: Diagnosis not present

## 2022-07-23 DIAGNOSIS — E1165 Type 2 diabetes mellitus with hyperglycemia: Secondary | ICD-10-CM | POA: Diagnosis not present

## 2022-07-24 DIAGNOSIS — E1165 Type 2 diabetes mellitus with hyperglycemia: Secondary | ICD-10-CM | POA: Diagnosis not present

## 2022-07-25 DIAGNOSIS — E1165 Type 2 diabetes mellitus with hyperglycemia: Secondary | ICD-10-CM | POA: Diagnosis not present

## 2022-07-26 ENCOUNTER — Other Ambulatory Visit: Payer: Self-pay

## 2022-07-26 ENCOUNTER — Encounter (HOSPITAL_COMMUNITY)
Admission: RE | Admit: 2022-07-26 | Discharge: 2022-07-26 | Disposition: A | Discharge status: Home / Self Care | Payer: Medicaid Other | Source: Ambulatory Visit | Attending: Nephrology | Admitting: Nephrology

## 2022-07-26 VITALS — BP 179/77 | HR 70 | Temp 97.6°F | Resp 18

## 2022-07-26 DIAGNOSIS — D631 Anemia in chronic kidney disease: Secondary | ICD-10-CM | POA: Diagnosis not present

## 2022-07-26 DIAGNOSIS — N185 Chronic kidney disease, stage 5: Secondary | ICD-10-CM | POA: Diagnosis not present

## 2022-07-26 DIAGNOSIS — E1165 Type 2 diabetes mellitus with hyperglycemia: Secondary | ICD-10-CM | POA: Diagnosis not present

## 2022-07-26 LAB — POCT HEMOGLOBIN-HEMACUE: Hemoglobin: 9.7 g/dL — ABNORMAL LOW (ref 12.0–15.0)

## 2022-07-26 MED ORDER — EPOETIN ALFA-EPBX 10000 UNIT/ML IJ SOLN
6000.0000 [IU] | Freq: Once | INTRAMUSCULAR | Status: AC
  Administered 2022-07-26: 6000 [IU] via SUBCUTANEOUS

## 2022-07-26 NOTE — Progress Notes (Signed)
Diagnosis: Anemia in Chronic Kidney Disease  Provider:  Celso Amy MD  Procedure: Injection  Retacrit (epoetin alfa-epbx), Dose: 6000 Units, Site: subcutaneous, Number of injections: 1  Hgb 9.7  Post Care: Observation period completed  Discharge: Condition: Good, Destination: Home . AVS Declined  Performed by:  Forrest Moron, RN

## 2022-07-26 NOTE — Addendum Note (Signed)
Encounter addended by: Wyvonne Lenz, RN on: 07/26/2022 1:46 PM  Actions taken: Therapy plan modified

## 2022-07-26 NOTE — Addendum Note (Signed)
Encounter addended by: Wyvonne Lenz, RN on: 07/26/2022 1:15 PM  Actions taken: Charge Capture section accepted

## 2022-07-27 DIAGNOSIS — E1165 Type 2 diabetes mellitus with hyperglycemia: Secondary | ICD-10-CM | POA: Diagnosis not present

## 2022-07-28 DIAGNOSIS — E1165 Type 2 diabetes mellitus with hyperglycemia: Secondary | ICD-10-CM | POA: Diagnosis not present

## 2022-07-29 DIAGNOSIS — E1165 Type 2 diabetes mellitus with hyperglycemia: Secondary | ICD-10-CM | POA: Diagnosis not present

## 2022-07-30 DIAGNOSIS — E1165 Type 2 diabetes mellitus with hyperglycemia: Secondary | ICD-10-CM | POA: Diagnosis not present

## 2022-07-31 DIAGNOSIS — E1165 Type 2 diabetes mellitus with hyperglycemia: Secondary | ICD-10-CM | POA: Diagnosis not present

## 2022-08-01 DIAGNOSIS — E1165 Type 2 diabetes mellitus with hyperglycemia: Secondary | ICD-10-CM | POA: Diagnosis not present

## 2022-08-02 DIAGNOSIS — E1165 Type 2 diabetes mellitus with hyperglycemia: Secondary | ICD-10-CM | POA: Diagnosis not present

## 2022-08-03 DIAGNOSIS — E1165 Type 2 diabetes mellitus with hyperglycemia: Secondary | ICD-10-CM | POA: Diagnosis not present

## 2022-08-04 ENCOUNTER — Ambulatory Visit: Payer: Medicaid Other | Admitting: Family Medicine

## 2022-08-04 VITALS — BP 138/76 | HR 76 | Temp 97.9°F | Ht 60.0 in | Wt 187.0 lb

## 2022-08-04 DIAGNOSIS — I5030 Unspecified diastolic (congestive) heart failure: Secondary | ICD-10-CM | POA: Insufficient documentation

## 2022-08-04 DIAGNOSIS — E782 Mixed hyperlipidemia: Secondary | ICD-10-CM | POA: Diagnosis not present

## 2022-08-04 DIAGNOSIS — E785 Hyperlipidemia, unspecified: Secondary | ICD-10-CM

## 2022-08-04 DIAGNOSIS — I10 Essential (primary) hypertension: Secondary | ICD-10-CM | POA: Diagnosis not present

## 2022-08-04 DIAGNOSIS — H547 Unspecified visual loss: Secondary | ICD-10-CM | POA: Diagnosis not present

## 2022-08-04 DIAGNOSIS — N185 Chronic kidney disease, stage 5: Secondary | ICD-10-CM

## 2022-08-04 DIAGNOSIS — E1169 Type 2 diabetes mellitus with other specified complication: Secondary | ICD-10-CM

## 2022-08-04 DIAGNOSIS — I5032 Chronic diastolic (congestive) heart failure: Secondary | ICD-10-CM | POA: Diagnosis not present

## 2022-08-04 DIAGNOSIS — E1165 Type 2 diabetes mellitus with hyperglycemia: Secondary | ICD-10-CM | POA: Diagnosis not present

## 2022-08-04 NOTE — Patient Instructions (Signed)
Labs today.  Referral to [facility] doctor.  Follow up in 1 month.

## 2022-08-05 DIAGNOSIS — E1165 Type 2 diabetes mellitus with hyperglycemia: Secondary | ICD-10-CM | POA: Diagnosis not present

## 2022-08-05 LAB — LIPID PANEL
Chol/HDL Ratio: 3.2 ratio (ref 0.0–4.4)
Cholesterol, Total: 133 mg/dL (ref 100–199)
HDL: 42 mg/dL (ref >39)
LDL Chol Calc (NIH): 70 mg/dL (ref 0–99)
Triglycerides: 119 mg/dL (ref 0–149)
VLDL Cholesterol Cal: 21 mg/dL (ref 5–40)

## 2022-08-05 LAB — HEMOGLOBIN A1C
Est. average glucose Bld gHb Est-mCnc: 266 mg/dL
Hgb A1c MFr Bld: 10.9 % — ABNORMAL HIGH (ref 4.8–5.6)

## 2022-08-06 DIAGNOSIS — H547 Unspecified visual loss: Secondary | ICD-10-CM | POA: Insufficient documentation

## 2022-08-06 DIAGNOSIS — E1165 Type 2 diabetes mellitus with hyperglycemia: Secondary | ICD-10-CM | POA: Diagnosis not present

## 2022-08-06 NOTE — Assessment & Plan Note (Signed)
Stable.  Continue current medications.

## 2022-08-06 NOTE — Assessment & Plan Note (Signed)
Referring to ophthalmology. 

## 2022-08-06 NOTE — Progress Notes (Signed)
Subjective:  Patient ID: Ana [name], female    DOB: 05/24/1970  Age: 52 y.o. MRN: [id number]  CC: Chief Complaint  Patient presents with   Establish Care    Pt is concerned with her blood pressure, blindness (can only see shadows( and reports her kidney function is at 9%    HPI:  52 year old female with CKD stage V, HFpEF, uncontrolled type 2 diabetes which is currently untreated, hypothyroidism, hyperlipidemia, anemia presents to establish care.  Patient is visually impaired.  It has been quite sometime since she saw an eye doctor.  She is unable to drive and is unable to do ADLs which require good vision such as cooking.  Is accompanied by her daughter today.  I suspect that she has diabetic retinopathy which has resulted in vision loss.  She is not currently on any medication for type 2 diabetes.  No recent A1c.  Needs A1c today.  Patient is followed by nephrology regarding advanced chronic kidney disease.  She is approaching the need for dialysis.  Patient is on Crestor regarding her lipids.  Needs lipid panel.  Hypertension stable on amlodipine, carvedilol, Lasix, and hydralazine.   Patient Active Problem List   Diagnosis Date Noted   Poor vision 08/06/2022   (HFpEF) heart failure with preserved ejection fraction (HCC) 08/04/2022   CKD (chronic kidney disease) stage 5, GFR less than 15 ml/min (HCC) 08/04/2022   Iron deficiency anemia 02/28/2022   Anemia due to stage 5 chronic kidney disease (HCC) 12/21/2021   Hypothyroidism, adult 09/02/2020   Vitamin D deficiency disease 09/02/2020   Type 2 diabetes mellitus with hyperlipidemia (HCC) 09/26/2017   Essential hypertension, benign 09/26/2017   Mixed hyperlipidemia 09/26/2017    Social Hx   Social History   Socioeconomic History   Marital status: Single    Spouse name: Not on file   Number of children: Not on file   Years of education: Not on file   Highest education level: Not on file  Occupational History    Not on file  Tobacco Use   Smoking status: Never   Smokeless tobacco: Never  Vaping Use   Vaping Use: Never used  Substance and Sexual Activity   Alcohol use: Not Currently    Comment: occ   Drug use: No   Sexual activity: Yes    Birth control/protection: Surgical  Other Topics Concern   Not on file  Social History Narrative   Divorced,married for 8 years.Lives with daughter and 3 grandkids.Unemployed.   Social Determinants of Health   Financial Resource Strain: Not on file  Food Insecurity: No Food Insecurity (12/22/2021)   Hunger Vital Sign    Worried About Running Out of Food in the Last Year: Never true    Ran Out of Food in the Last Year: Never true  Transportation Needs: No Transportation Needs (12/22/2021)   PRAPARE - Administrator, Civil Service (Medical): No    Lack of Transportation (Non-Medical): No  Physical Activity: Not on file  Stress: Not on file  Social Connections: Not on file    Review of Systems  Constitutional: Negative.   Eyes:  Positive for visual disturbance.  Cardiovascular: Negative.    Objective:  BP 138/76   Pulse 76   Temp 97.9 F (36.6 C)   Ht 5' (1.524 m)   Wt 187 lb (84.8 kg)   LMP 09/22/2019 (Approximate)   SpO2 98%   BMI 36.52 kg/m  08/04/2022   10:36 AM 07/26/2022   11:59 AM 07/12/2022   12:09 PM  BP/Weight  Systolic BP 138 179 175  Diastolic BP 76 77 72  Wt. (Lbs) 187    BMI 36.52 kg/m2      Physical Exam Vitals and nursing note reviewed.     Lab Results  Component Value Date   WBC 9.4 07/12/2022   HGB 9.7 (L) 07/26/2022   HCT 25.7 (L) 07/12/2022   PLT 289 07/12/2022   GLUCOSE 299 (H) 07/12/2022   CHOL 133 08/04/2022   TRIG 119 08/04/2022   HDL 42 08/04/2022   LDLCALC 70 08/04/2022   ALT 34 03/18/2022   AST 36 03/18/2022   NA 134 (L) 07/12/2022   K 3.8 07/12/2022   CL 99 07/12/2022   CREATININE 5.19 (H) 07/12/2022   BUN 59 (H) 07/12/2022   CO2 24 07/12/2022   TSH 2.013 03/18/2022    INR 1.0 12/22/2021   HGBA1C 10.9 (H) 08/04/2022     Assessment & Plan:   Problem List Items Addressed This Visit       Cardiovascular and Mediastinum   (HFpEF) heart failure with preserved ejection fraction (HCC)   Essential hypertension, benign    Stable.  Continue current medications.        Endocrine   Type 2 diabetes mellitus with hyperlipidemia (HCC) - Primary    Uncontrolled.  Untreated.  A1c has been done and is elevated at 10.9.  Patient will be contacted as A1c came back after visit was complete.  Recommending insulin therapy.  Recommend starting with 10 units of Lantus and titration upward to achieve fasting blood sugar goal.      Relevant Orders   Hemoglobin A1c (Completed)   Ambulatory referral to Ophthalmology     Genitourinary   CKD (chronic kidney disease) stage 5, GFR less than 15 ml/min (HCC)    Needs close follow-up with nephrology.  Approaching hemodialysis.        Other   Mixed hyperlipidemia   Relevant Orders   Lipid panel (Completed)   Poor vision    Referring to ophthalmology.      Relevant Orders   Ambulatory referral to Ophthalmology    Follow-up:  Return in about 1 month (around 09/03/2022).  Everlene Other DO [facility]

## 2022-08-06 NOTE — Assessment & Plan Note (Signed)
Needs close follow-up with nephrology.  Approaching hemodialysis.

## 2022-08-06 NOTE — Assessment & Plan Note (Signed)
Uncontrolled.  Untreated.  A1c has been done and is elevated at 10.9.  Patient will be contacted as A1c came back after visit was complete.  Recommending insulin therapy.  Recommend starting with 10 units of Lantus and titration upward to achieve fasting blood sugar goal.

## 2022-08-07 DIAGNOSIS — E1165 Type 2 diabetes mellitus with hyperglycemia: Secondary | ICD-10-CM | POA: Diagnosis not present

## 2022-08-08 DIAGNOSIS — E1165 Type 2 diabetes mellitus with hyperglycemia: Secondary | ICD-10-CM | POA: Diagnosis not present

## 2022-08-09 ENCOUNTER — Encounter (HOSPITAL_COMMUNITY)
Admission: RE | Admit: 2022-08-09 | Discharge: 2022-08-09 | Disposition: A | Discharge status: Home / Self Care | Payer: Medicaid Other | Source: Ambulatory Visit | Attending: Nephrology | Admitting: Nephrology

## 2022-08-09 VITALS — BP 181/76 | HR 73 | Temp 98.4°F | Resp 18

## 2022-08-09 DIAGNOSIS — N185 Chronic kidney disease, stage 5: Secondary | ICD-10-CM | POA: Insufficient documentation

## 2022-08-09 DIAGNOSIS — E1165 Type 2 diabetes mellitus with hyperglycemia: Secondary | ICD-10-CM | POA: Diagnosis not present

## 2022-08-09 DIAGNOSIS — D631 Anemia in chronic kidney disease: Secondary | ICD-10-CM | POA: Insufficient documentation

## 2022-08-09 LAB — POCT HEMOGLOBIN-HEMACUE: Hemoglobin: 10 g/dL — ABNORMAL LOW (ref 12.0–15.0)

## 2022-08-09 MED ORDER — EPOETIN ALFA-EPBX 10000 UNIT/ML IJ SOLN: 6000.0000 [IU] | Freq: Once | INTRAMUSCULAR | Status: DC

## 2022-08-09 NOTE — Addendum Note (Signed)
Encounter addended by: Wyvonne Lenz, RN on: 08/09/2022 3:54 PM  Actions taken: Therapy plan modified

## 2022-08-09 NOTE — Progress Notes (Signed)
Diagnosis: Anemia in Chronic Kidney Disease  Provider:  Celso Amy MD  Procedure: Injection  Retacrit (epoetin alfa-epbx), Dose: 6000 Units, Site: subcutaneous, Number of injections: 1  Hgb 10.0    Did not receive Retacrit today  Post Care: Observation period completed  Discharge: Condition: Good, Destination: Home . AVS Declined  Performed by:  Forrest Moron, RN

## 2022-08-10 DIAGNOSIS — E1165 Type 2 diabetes mellitus with hyperglycemia: Secondary | ICD-10-CM | POA: Diagnosis not present

## 2022-08-11 DIAGNOSIS — E1165 Type 2 diabetes mellitus with hyperglycemia: Secondary | ICD-10-CM | POA: Diagnosis not present

## 2022-08-12 DIAGNOSIS — E1165 Type 2 diabetes mellitus with hyperglycemia: Secondary | ICD-10-CM | POA: Diagnosis not present

## 2022-08-13 DIAGNOSIS — E1165 Type 2 diabetes mellitus with hyperglycemia: Secondary | ICD-10-CM | POA: Diagnosis not present

## 2022-08-14 DIAGNOSIS — E1165 Type 2 diabetes mellitus with hyperglycemia: Secondary | ICD-10-CM | POA: Diagnosis not present

## 2022-08-15 DIAGNOSIS — E1165 Type 2 diabetes mellitus with hyperglycemia: Secondary | ICD-10-CM | POA: Diagnosis not present

## 2022-08-16 DIAGNOSIS — E1165 Type 2 diabetes mellitus with hyperglycemia: Secondary | ICD-10-CM | POA: Diagnosis not present

## 2022-08-17 DIAGNOSIS — E1165 Type 2 diabetes mellitus with hyperglycemia: Secondary | ICD-10-CM | POA: Diagnosis not present

## 2022-08-18 DIAGNOSIS — E1165 Type 2 diabetes mellitus with hyperglycemia: Secondary | ICD-10-CM | POA: Diagnosis not present

## 2022-08-19 DIAGNOSIS — E1165 Type 2 diabetes mellitus with hyperglycemia: Secondary | ICD-10-CM | POA: Diagnosis not present

## 2022-08-20 DIAGNOSIS — E1165 Type 2 diabetes mellitus with hyperglycemia: Secondary | ICD-10-CM | POA: Diagnosis not present

## 2022-08-21 ENCOUNTER — Encounter (HOSPITAL_COMMUNITY): Payer: Self-pay | Admitting: Nephrology

## 2022-08-21 ENCOUNTER — Ambulatory Visit
Admission: RE | Admit: 2022-08-21 | Discharge: 2022-08-21 | Disposition: A | Discharge status: Home / Self Care | Payer: Medicaid Other | Source: Ambulatory Visit | Attending: Nurse Practitioner | Admitting: Nurse Practitioner

## 2022-08-21 ENCOUNTER — Telehealth: Payer: Self-pay

## 2022-08-21 VITALS — BP 154/7 | HR 81 | Temp 98.4°F | Resp 16

## 2022-08-21 DIAGNOSIS — R21 Rash and other nonspecific skin eruption: Secondary | ICD-10-CM | POA: Diagnosis not present

## 2022-08-21 DIAGNOSIS — E1165 Type 2 diabetes mellitus with hyperglycemia: Secondary | ICD-10-CM | POA: Diagnosis not present

## 2022-08-21 MED ORDER — TRIAMCINOLONE ACETONIDE 0.025 % EX OINT: 1.0000 | TOPICAL_OINTMENT | Freq: Two times a day (BID) | CUTANEOUS | 0 refills | Status: DC

## 2022-08-21 MED ORDER — METHYLPREDNISOLONE ACETATE 40 MG/ML IJ SUSP
20.0000 mg | Freq: Once | INTRAMUSCULAR | Status: AC
  Administered 2022-08-21: 20 mg via INTRAMUSCULAR

## 2022-08-21 NOTE — Discharge Instructions (Addendum)
We have given you a small dose of steroid medication today.  Please monitor yourself closely for signs of fluid retention; with any new chest pain, swelling, or shortness of breath call 911 or go to the hospital.    Start using a little bit of the steroid ointment on the itchy rash.  Do not use on your face.  Recommend avoid using the new laundry detergent.  Follow up with PCP for persistent/worsening symptoms despite treatment.

## 2022-08-21 NOTE — Telephone Encounter (Signed)
Patient was informed of her lab results and would like to have the weekly insulin type, glp 1  to be sent to [facility] on [address] , please advise

## 2022-08-21 NOTE — ED Provider Notes (Signed)
[facility]    CSN: [id number] Arrival date & time: 08/21/22  1258      History   Chief Complaint Chief Complaint  Patient presents with   Rash    Entered by patient    HPI Ana [name] is a 52 y.o. female.   Patient presents today with daughter for itchy rash to bilateral arms, face, chest, and upper back.  She reports the rash began after using a new laundry detergent.  No fever or nausea/vomiting.  No shortness of breath, throat/tongue swelling, or difficulty swallowing, or difficulty maintaining secretions.  Has been applying hydrocortisone cream without relief.  No history of similar.  No recent travel or sleeping in a bed that is not hers.  No household contacts with similar symptoms.  Medical history complicated by stage V renal disease, not currently and is at dialysis, type 2 diabetes mellitus; last A1c 10.9%, and heart failure with preserved ejection fraction.  Patient reports history of anaphylaxis with Zyrtec.      Past Medical History:  Diagnosis Date   (HFpEF) heart failure with preserved ejection fraction (HCC)    a. echo in 10/2021 showing preserved EF of 60-65% with Grade 2 DD and small pericardial effusion.   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) 12/21/2021   Hypothyroidism, adult 09/02/2020   Vitamin D deficiency disease 09/02/2020   Type 2 diabetes mellitus with hyperlipidemia (HCC) 09/26/2017   Essential hypertension, benign 09/26/2017   Mixed hyperlipidemia 09/26/2017    Past Surgical History:  Procedure Laterality Date   CESAREAN SECTION  x4   CHOLECYSTECTOMY      EYE SURGERY     MASS EXCISION  10/27/2011   Procedure: EXCISION MASS;  Surgeon: Fabio Bering, MD;  Location: [facility];  Service: General;  Laterality: Right;   TUBAL LIGATION      OB History     Gravida  6   Para  5   Term      Preterm  5   AB  1   Living         SAB  1   IAB      Ectopic      Multiple      Live Births               Home Medications    Prior to Admission medications   Medication Sig Start Date End Date Taking? Authorizing Provider  amLODipine (NORVASC) 10 MG tablet TAKE 1 TABLET BY MOUTH DAILY Patient taking differently: Take 10 mg by mouth daily. 12/25/21  Yes Erick Blinks, MD  calcitRIOL (ROCALTROL) 0.25 MCG capsule Take 0.25 mcg by mouth every Monday, Wednesday, and Friday. 03/03/22 03/03/23 Yes [provider]  carvedilol (COREG) 25 MG tablet Take 1 tablet (25 mg total) by mouth 2 (two) times daily. Patient taking differently: Take 25 mg by mouth daily. 02/22/22 02/17/23 Yes Pricilla Riffle, MD  furosemide (LASIX) 40 MG tablet Take 40 mg by mouth 2 (two) times daily.   Yes [provider]  hydrALAZINE (APRESOLINE) 50 MG tablet  Take 1.5 tablets (75 mg total) by mouth 3 (three) times daily. Patient taking differently: Take 50 mg by mouth 3 (three) times daily. 12/25/21 08/22/22 Yes Erick Blinks, MD  levothyroxine (SYNTHROID) 50 MCG tablet TAKE 1 TABLET(50 MCG) BY MOUTH DAILY Patient taking differently: Take 50 mcg by mouth daily before breakfast. TAKE 1 TABLET(50 MCG) BY MOUTH DAILY 12/25/21  Yes Erick Blinks, MD  rosuvastatin (CRESTOR) 40 MG tablet Take 1 tablet (40 mg total) by mouth daily with supper. 12/25/21 12/20/22 Yes Erick Blinks, MD  sodium bicarbonate 650 MG tablet Take 650 mg by mouth 4 (four) times daily.   Yes [provider]  triamcinolone (KENALOG) 0.025 % ointment Apply 1 Application topically 2 (two) times daily. Apply sparingly twice daily to itchy areas; not for use on the face 08/21/22   Yes Valentino Nose, NP    Family History Family History  Problem Relation Age of Onset   Kidney failure Mother    Diabetes Mother    Kidney disease Mother    Kidney failure Father    Kidney disease Father    Kidney failure Brother    Diabetes Brother    Diabetes Brother     Social History Social History   Tobacco Use   Smoking status: Never   Smokeless tobacco: Never  Vaping Use   Vaping Use: Never used  Substance Use Topics   Alcohol use: Not Currently    Comment: occ   Drug use: No     Allergies   Ranitidine and Ranitidine hcl   Review of Systems Review of Systems Per HPI  Physical Exam Triage Vital Signs ED Triage Vitals  Enc Vitals Group     BP 08/21/22 1332 (!) 154/7     Pulse Rate 08/21/22 1332 81     Resp 08/21/22 1332 16     Temp 08/21/22 1332 98.4 F (36.9 C)     Temp Source 08/21/22 1332 Oral     SpO2 08/21/22 1332 93 %     Weight --      Height --      Head Circumference --      Peak Flow --      Pain Score 08/21/22 1305 0     Pain Loc --      Pain Edu? --      Excl. in GC? --    No data found.  Updated Vital Signs BP (!) 154/7 (BP Location: Right Arm)   Pulse 81   Temp 98.4 F (36.9 C) (Oral)   Resp 16   LMP 09/22/2019 (Approximate)   SpO2 93%   Blood pressure was entered incorrectly; actual blood pressure: 154/72  Visual Acuity Right Eye Distance:   Left Eye Distance:   Bilateral Distance:    Right Eye Near:   Left Eye Near:    Bilateral Near:     Physical Exam Vitals and nursing note reviewed.  Constitutional:      General: She is not in acute distress.    Appearance: Normal appearance. She is not toxic-appearing.  HENT:     Head: Normocephalic and atraumatic.     Mouth/Throat:     Mouth: Mucous membranes are moist.     Pharynx: Oropharynx is clear. No oropharyngeal exudate or posterior oropharyngeal erythema.  Pulmonary:     Effort: Pulmonary effort is normal. No respiratory distress.     Breath sounds:  No wheezing, rhonchi or rales.  Skin:    General: Skin is warm and  dry.     Capillary Refill: Capillary refill takes less than 2 seconds.     Findings: Rash present. No erythema.     Comments: Papular, flesh-colored rash to bilateral upper extremities, face, neck.  There is no oozing, drainage, warmth, or fluctuance.  Neurological:     Mental Status: She is alert and oriented to person, place, and time.  Psychiatric:        Behavior: Behavior is cooperative.      UC Treatments / Results  Labs (all labs ordered are listed, but only abnormal results are displayed) Labs Reviewed - No data to display  EKG   Radiology No results found.  Procedures Procedures (including critical care time)  Medications Ordered in UC Medications  methylPREDNISolone acetate (DEPO-MEDROL) injection 20 mg (20 mg Intramuscular Given 08/21/22 1330)    Initial Impression / Assessment and Plan / UC Course  I have reviewed the triage vital signs and the nursing notes.  Pertinent labs & imaging results that were available during my care of the patient were reviewed by me and considered in my medical decision making (see chart for details).   Patient is well-appearing, normotensive, afebrile, not tachycardic, not tachypneic, oxygenating well on room air.    1. Rash and nonspecific skin eruption I discussed with the patient and daughter that I suspect she has contact dermatitis secondary to new laundry detergent Recommended discontinuing use of new laundry detergent Start topical steroid ointment to help with itching IM injection of Depo-Medrol 20 mg IM given today in urgent care; would rather start oral antihistamine, however patient has history of anaphylaxis with Zyrtec so will avoid antihistamines Discussed side effects of corticosteroids with patient and daughter including fluid retention, elevated blood sugars, cardiovascular risk and they wish to proceed.  They understand that it is a lower than  normal dose Recommended ER follow-up if symptoms worsen, follow-up with PCP if symptoms persist despite treatment  The patient was given the opportunity to ask questions.  All questions answered to their satisfaction.  The patient is in agreement to this plan.    Final Clinical Impressions(s) / UC Diagnoses   Final diagnoses:  Rash and nonspecific skin eruption     Discharge Instructions      We have given you a small dose of steroid medication today.  Please monitor yourself closely for signs of fluid retention; with any new chest pain, swelling, or shortness of breath call 911 or go to the hospital.    Start using a little bit of the steroid ointment on the itchy rash.  Do not use on your face.  Recommend avoid using the new laundry detergent.  Follow up with PCP for persistent/worsening symptoms despite treatment.    ED Prescriptions     Medication Sig Dispense Auth. Provider   triamcinolone (KENALOG) 0.025 % ointment Apply 1 Application topically 2 (two) times daily. Apply sparingly twice daily to itchy areas; not for use on the face 15 g Valentino Nose, NP      PDMP not reviewed this encounter.   Valentino Nose, NP 08/21/22 [phone]

## 2022-08-21 NOTE — ED Triage Notes (Signed)
Pt presents with itchy rash all over body that started Friday. Pt has tried hydrocortisone cream with no relief.

## 2022-08-22 ENCOUNTER — Other Ambulatory Visit: Payer: Self-pay | Admitting: Family Medicine

## 2022-08-22 ENCOUNTER — Ambulatory Visit
Admission: EM | Admit: 2022-08-22 | Discharge: 2022-08-22 | Disposition: A | Discharge status: Home / Self Care | Payer: Medicaid Other | Attending: Nurse Practitioner | Admitting: Nurse Practitioner

## 2022-08-22 DIAGNOSIS — L299 Pruritus, unspecified: Secondary | ICD-10-CM

## 2022-08-22 DIAGNOSIS — E1165 Type 2 diabetes mellitus with hyperglycemia: Secondary | ICD-10-CM | POA: Diagnosis not present

## 2022-08-22 MED ORDER — PERMETHRIN 5 % EX CREA: TOPICAL_CREAM | CUTANEOUS | 0 refills | Status: DC

## 2022-08-22 NOTE — ED Triage Notes (Signed)
Pt states used the entire tube of steriod cream that was prescribed to her x 1 day. States cream did not work for her.

## 2022-08-22 NOTE — Discharge Instructions (Signed)
Recommend Benadryl 12.5 mg every 6 hours as needed for itching.  You can try the topical permethrin and see if this helps with the itching.  Please follow-up with Dr. Wolfgang Phoenix if symptoms do not improve.

## 2022-08-23 ENCOUNTER — Encounter (HOSPITAL_COMMUNITY)
Admission: RE | Admit: 2022-08-23 | Discharge: 2022-08-23 | Disposition: A | Discharge status: Home / Self Care | Payer: Medicaid Other | Source: Ambulatory Visit | Attending: Nephrology | Admitting: Nephrology

## 2022-08-23 ENCOUNTER — Encounter: Payer: Self-pay | Admitting: Student

## 2022-08-23 ENCOUNTER — Other Ambulatory Visit: Payer: Self-pay

## 2022-08-23 ENCOUNTER — Ambulatory Visit: Payer: Medicaid Other | Attending: Student | Admitting: Student

## 2022-08-23 VITALS — BP 142/70 | HR 78 | Ht 60.0 in | Wt 188.0 lb

## 2022-08-23 VITALS — BP 159/68 | HR 75 | Temp 97.6°F | Resp 20

## 2022-08-23 DIAGNOSIS — D631 Anemia in chronic kidney disease: Secondary | ICD-10-CM | POA: Diagnosis not present

## 2022-08-23 DIAGNOSIS — E782 Mixed hyperlipidemia: Secondary | ICD-10-CM

## 2022-08-23 DIAGNOSIS — I5032 Chronic diastolic (congestive) heart failure: Secondary | ICD-10-CM

## 2022-08-23 DIAGNOSIS — N185 Chronic kidney disease, stage 5: Secondary | ICD-10-CM | POA: Diagnosis not present

## 2022-08-23 DIAGNOSIS — I10 Essential (primary) hypertension: Secondary | ICD-10-CM

## 2022-08-23 DIAGNOSIS — E1165 Type 2 diabetes mellitus with hyperglycemia: Secondary | ICD-10-CM | POA: Diagnosis not present

## 2022-08-23 DIAGNOSIS — E1169 Type 2 diabetes mellitus with other specified complication: Secondary | ICD-10-CM

## 2022-08-23 LAB — RENAL FUNCTION PANEL
Albumin: 4.1 g/dL (ref 3.5–5.0)
Anion gap: 11 (ref 5–15)
BUN: 80 mg/dL — ABNORMAL HIGH (ref 6–20)
CO2: 25 mmol/L (ref 22–32)
Calcium: 9.2 mg/dL (ref 8.9–10.3)
Chloride: 99 mmol/L (ref 98–111)
Creatinine, Ser: 6.54 mg/dL — ABNORMAL HIGH (ref 0.44–1.00)
GFR, Estimated: 7 mL/min — ABNORMAL LOW (ref >60)
Glucose, Bld: 265 mg/dL — ABNORMAL HIGH (ref 70–99)
Phosphorus: 4.8 mg/dL — ABNORMAL HIGH (ref 2.5–4.6)
Potassium: 3.6 mmol/L (ref 3.5–5.1)
Sodium: 135 mmol/L (ref 135–145)

## 2022-08-23 LAB — CBC WITH DIFFERENTIAL/PLATELET
Abs Immature Granulocytes: 0.02 10*3/uL (ref 0.00–0.07)
Basophils Absolute: 0 10*3/uL (ref 0.0–0.1)
Basophils Relative: 0 %
Eosinophils Absolute: 0.3 10*3/uL (ref 0.0–0.5)
Eosinophils Relative: 4 %
HCT: 31.4 % — ABNORMAL LOW (ref 36.0–46.0)
Hemoglobin: 10.3 g/dL — ABNORMAL LOW (ref 12.0–15.0)
Immature Granulocytes: 0 %
Lymphocytes Relative: 13 %
Lymphs Abs: 1 10*3/uL (ref 0.7–4.0)
MCH: 29.1 pg (ref 26.0–34.0)
MCHC: 32.8 g/dL (ref 30.0–36.0)
MCV: 88.7 fL (ref 80.0–100.0)
Monocytes Absolute: 0.6 10*3/uL (ref 0.1–1.0)
Monocytes Relative: 7 %
Neutro Abs: 6 10*3/uL (ref 1.7–7.7)
Neutrophils Relative %: 76 %
Platelets: 290 10*3/uL (ref 150–400)
RBC: 3.54 MIL/uL — ABNORMAL LOW (ref 3.87–5.11)
RDW: 12.8 % (ref 11.5–15.5)
WBC: 7.9 10*3/uL (ref 4.0–10.5)
nRBC: 0 % (ref 0.0–0.2)

## 2022-08-23 LAB — PROTEIN / CREATININE RATIO, URINE
Creatinine, Urine: 82 mg/dL
Protein Creatinine Ratio: 3.21 mg/mg{Cre} — ABNORMAL HIGH (ref 0.00–0.15)
Total Protein, Urine: 263 mg/dL

## 2022-08-23 MED ORDER — CARVEDILOL 25 MG PO TABS: 25.0000 mg | ORAL_TABLET | Freq: Two times a day (BID) | ORAL | 3 refills | Status: DC

## 2022-08-23 MED ORDER — EPOETIN ALFA-EPBX 10000 UNIT/ML IJ SOLN: 6000.0000 [IU] | Freq: Once | INTRAMUSCULAR | Status: DC

## 2022-08-23 MED ORDER — HYDRALAZINE HCL 100 MG PO TABS: 100.0000 mg | ORAL_TABLET | Freq: Three times a day (TID) | ORAL | 3 refills | Status: DC

## 2022-08-23 NOTE — ED Provider Notes (Signed)
[facility]    CSN: [id number] Arrival date & time: 08/22/22  1605      History   Chief Complaint No chief complaint on file.   HPI Ana [name] is a 52 y.o. female.   Patient presents today for an ongoing itchy rash to entire body.  She was seen by me in urgent care yesterday for the same and was treated with Depo-Medrol and topical triamcinolone ointment.  She reports she is using entire tube of triamcinolone ointment in the Depo-Medrol did not help with the itching and neither has the ointment.  Daughter-in-law gave her 2 Benadryl earlier today which seemed to help a little bit with the itching.  She has not been using the detergent anymore.  No fever, nausea/vomiting, change in appetite, change in behavior.  No oozing or draining from the rash.  Reports the rash is "all over."  No shortness of breath, throat or tongue swelling, or difficulty swallowing.  I did not recommend taking an oral antihistamine because patient tells me she had an anaphylactic reaction to Zyrtec.  Medical history significant for stage V chronic kidney disease.  She tells me today her nephrologist is discussing dialysis with her.  Reports she has urinated today, denies any change in odor of the urine.  She has poor vision and is unable to visualize the urine.    Past Medical History:  Diagnosis Date   (HFpEF) heart failure with preserved ejection fraction (HCC)    a. echo in 10/2021 showing preserved EF of 60-65% with Grade 2 DD and small pericardial effusion.   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) 12/21/2021    Hypothyroidism, adult 09/02/2020   Vitamin D deficiency disease 09/02/2020   Type 2 diabetes mellitus with hyperlipidemia (HCC) 09/26/2017   Essential hypertension, benign 09/26/2017   Mixed hyperlipidemia 09/26/2017    Past Surgical History:  Procedure Laterality Date   CESAREAN SECTION  x4   CHOLECYSTECTOMY     EYE SURGERY     MASS EXCISION  10/27/2011   Procedure: EXCISION MASS;  Surgeon: Fabio Bering, MD;  Location: [facility];  Service: General;  Laterality: Right;   TUBAL LIGATION      OB History     Gravida  6   Para  5   Term      Preterm  5   AB  1   Living         SAB  1   IAB      Ectopic      Multiple      Live Births               Home Medications    Prior to Admission medications   Medication Sig Start Date End Date Taking? Authorizing Provider  permethrin (ELIMITE) 5 % cream Apply to affected area once 08/22/22  Yes Cathlean Marseilles A, NP  amLODipine (NORVASC) 10 MG tablet TAKE 1 TABLET BY MOUTH DAILY Patient taking differently: Take 10 mg by mouth daily. 12/25/21   Erick Blinks, MD  calcitRIOL (ROCALTROL) 0.25 MCG  capsule Take 0.25 mcg by mouth every Monday, Wednesday, and Friday. 03/03/22 03/03/23  [provider]  carvedilol (COREG) 25 MG tablet Take 1 tablet (25 mg total) by mouth 2 (two) times daily. Patient taking differently: Take 25 mg by mouth daily. 02/22/22 02/17/23  Pricilla Riffle, MD  furosemide (LASIX) 40 MG tablet Take 40 mg by mouth 2 (two) times daily.    [provider]  hydrALAZINE (APRESOLINE) 50 MG tablet Take 1.5 tablets (75 mg total) by mouth 3 (three) times daily. Patient taking differently: Take 50 mg by mouth 3 (three) times daily. 12/25/21 08/22/22  Erick Blinks, MD  levothyroxine (SYNTHROID) 50 MCG tablet TAKE 1 TABLET(50 MCG) BY MOUTH EVERY DAY 08/22/22   Everlene Other G, DO  rosuvastatin (CRESTOR) 40 MG tablet Take 1 tablet (40 mg total) by mouth daily with supper. 12/25/21 12/20/22  Erick Blinks, MD  sodium bicarbonate 650 MG tablet Take 650 mg by mouth 4 (four) times daily.    [provider]  triamcinolone (KENALOG) 0.025 % ointment Apply 1 Application topically 2 (two) times daily. Apply sparingly twice daily to itchy areas; not for use on the face 08/21/22   Valentino Nose, NP    Family History Family History  Problem Relation Age of Onset   Kidney failure Mother    Diabetes Mother    Kidney disease Mother    Kidney failure Father    Kidney disease Father    Kidney failure Brother    Diabetes Brother    Diabetes Brother     Social History Social History   Tobacco Use   Smoking status: Never   Smokeless tobacco: Never  Vaping Use   Vaping Use: Never used  Substance Use Topics   Alcohol use: Not Currently    Comment: occ   Drug use: No     Allergies   Ranitidine and Ranitidine hcl   Review of Systems Review of Systems Per HPI  Physical Exam Triage Vital Signs ED Triage Vitals  Enc Vitals Group     BP 08/22/22 1649 (!) 151/71     Pulse Rate 08/22/22 1649 97     Resp 08/22/22 1649 18     Temp 08/22/22 1649 97.7 F (36.5 C)     Temp Source 08/22/22 1649 Oral     SpO2 08/22/22 1649 98 %     Weight --      Height --      Head Circumference --      Peak Flow --      Pain Score 08/22/22 1650 0     Pain Loc --      Pain Edu? --      Excl. in GC? --    No data found.  Updated Vital Signs BP (!) 151/71 (BP Location: Right Arm)   Pulse 97   Temp 97.7 F (36.5 C) (Oral)   Resp 18   LMP 09/22/2019 (Approximate)   SpO2 98%   Visual Acuity Right Eye Distance:   Left Eye Distance:   Bilateral Distance:    Right Eye Near:   Left Eye Near:    Bilateral Near:     Physical Exam Vitals and nursing note reviewed.  Constitutional:      General: She is not in acute distress.    Appearance: Normal appearance. She is not toxic-appearing.  HENT:     Head: Normocephalic and atraumatic.     Mouth/Throat:     Mouth: Mucous  membranes  are moist.     Pharynx: Oropharynx is clear. No oropharyngeal exudate or posterior oropharyngeal erythema.  Pulmonary:     Effort: Pulmonary effort is normal. No respiratory distress.     Breath sounds: No wheezing, rhonchi or rales.  Skin:    General: Skin is warm and dry.     Capillary Refill: Capillary refill takes less than 2 seconds.     Findings: Rash present. No erythema.     Comments: Papular, flesh-colored rash to bilateral upper extremities, face, neck.  There is no oozing, drainage, warmth, or fluctuance.  No itching in between digits or in the webspaces.  Neurological:     Mental Status: She is alert and oriented to person, place, and time.  Psychiatric:        Behavior: Behavior is cooperative.      UC Treatments / Results  Labs (all labs ordered are listed, but only abnormal results are displayed) Labs Reviewed - No data to display  EKG   Radiology No results found.  Procedures Procedures (including critical care time)  Medications Ordered in UC Medications - No data to display  Initial Impression / Assessment and Plan / UC Course  I have reviewed the triage vital signs and the nursing notes.  Pertinent labs & imaging results that were available during my care of the patient were reviewed by me and considered in my medical decision making (see chart for details).   Patient is well-appearing, normotensive, afebrile, not tachycardic, not tachypneic, oxygenating well on room air.    1. Pruritus I discussed with the patient and daughter-in-law that I am unsure what is causing the rash My differentials include contact dermatitis, scabies, uremic pruritus We have already treated for contact dermatitis and she has taken all of the prescription ointment that I have sent to the pharmacy yesterday, therefore will not prescribe more for her Recommended trying [place] cream to treat for scabies given intense itching She can take low-dose Benadryl every 6  hours as needed as this did not seem to cause an allergic reaction Discussed use of this with caution given CKD Will defer further corticosteroids at this time given uncontrolled type 2 diabetes I offered checking kidney function to evaluate for possible uremic pruritus, however patient declines and states she has an appointment tomorrow for an infusion tomorrow they will check her blood work I recommended follow-up with PCP/nephrology with no improvement in symptoms despite treatment  The patient was given the opportunity to ask questions.  All questions answered to their satisfaction.  The patient is in agreement to this plan.    Final Clinical Impressions(s) / UC Diagnoses   Final diagnoses:  Pruritus     Discharge Instructions      Recommend Benadryl 12.5 mg every 6 hours as needed for itching.  You can try the topical permethrin and see if this helps with the itching.  Please follow-up with Dr. Wolfgang Phoenix if symptoms do not improve.    ED Prescriptions     Medication Sig Dispense Auth. Provider   permethrin (ELIMITE) 5 % cream Apply to affected area once 60 g Valentino Nose, NP      PDMP not reviewed this encounter.   Valentino Nose, NP 08/23/22 1155

## 2022-08-23 NOTE — Patient Instructions (Signed)
Medication Instructions:   Take Coreg 25mg  twice daily.   *If you need a refill on your cardiac medications before your next appointment, please call your pharmacy*   Follow-Up: At [facility], you and your health needs are our priority.  As part of our continuing mission to provide you with exceptional heart care, we have created designated Provider Care Teams.  These Care Teams include your primary Cardiologist (physician) and Advanced Practice Providers (APPs -  Physician Assistants and Nurse Practitioners) who all work together to provide you with the care you need, when you need it.  We recommend signing up for the patient portal called "MyChart".  Sign up information is provided on this After Visit Summary.  MyChart is used to connect with patients for Virtual Visits (Telemedicine).  Patients are able to view lab/test results, encounter notes, upcoming appointments, etc.  Non-urgent messages can be sent to your provider as well.   To learn more about what you can do with MyChart, go to [email or web address].    Your next appointment:   6 month(s)  Provider:   You may see Dietrich Pates, MD or one of the following Advanced Practice Providers on your designated Care Team:   [place], PA-C  Jacolyn Reedy, [place]

## 2022-08-23 NOTE — Progress Notes (Signed)
Diagnosis: Anemia in Chronic Kidney Disease  Provider:  Celso Amy MD  Procedure: Injection  Retacrit (epoetin alfa-epbx), Dose: 6000 Units, Site: subcutaneous, Number of injections: 0  Hgb 10.3. Injection not administered - order parameters not met.  Post Care: Observation period not necessary.  Discharge: Condition: Good, Destination: Home . AVS Declined  Performed by:  Wyvonne Lenz, RN

## 2022-08-23 NOTE — Addendum Note (Signed)
Encounter addended by: Arrie Senate, RN on: 08/23/2022 1:28 PM  Actions taken: Therapy plan modified

## 2022-08-23 NOTE — Progress Notes (Signed)
Cardiology Office Note    Date:  08/23/2022  ID:  Ana [name], DOB 05/19/1970, MRN [id number] Cardiologist: Dietrich Pates, MD    History of Present Illness:    Ana [name] is a 52 y.o. female with past medical history of HFpEF, HTN, HLD, Type II DM and Stage IV-V CKD who presents to the office today for 26-month follow-up.  She was examined by myself in 01/2022 and had recently been hospitalized for an acute CHF exacerbation and reported overall feeling back to baseline the time of her office visit. Her weight was stable at 195 lbs and diuretic therapy had been deferred to Nephrology. She was continued on Lasix 80 mg twice daily and was not on an SGLT2 inhibitor given her GFR of 8.  In talking with the patient, her daughter and her daughter-in-law today, she reports overall doing well since her last office visit. No hospitalizations in the interim. Reports her weight has been stable and has actually declined since her last office visit. She denies any recent chest pain, palpitations, dyspnea on exertion, orthopnea, PND or pitting edema. Her blood pressure is elevated today and she reports not checking this regularly at home. In reviewing her medication list, she has only been taking Coreg once daily instead of twice daily and her daughter is planning to update her pillbox with this.  Studies Reviewed:   EKG: EKG is not ordered today.  Echocardiogram: 10/2021 IMPRESSIONS     1. Left ventricular ejection fraction, by estimation, is 60 to 65%. The  left ventricle has normal function. The left ventricle has no regional  wall motion abnormalities. Left ventricular diastolic parameters are  consistent with Grade II diastolic  dysfunction (pseudonormalization). Elevated left ventricular end-diastolic  pressure.   2. Right ventricular systolic function was not well visualized. The right  ventricular size is not well visualized. Tricuspid regurgitation signal is  inadequate for assessing PA  pressure.   3. Left atrial size was moderately dilated.   4. A small pericardial effusion is present. The pericardial effusion is  circumferential. There is no evidence of cardiac tamponade.   5. The mitral valve is normal in structure. No evidence of mitral valve  regurgitation. No evidence of mitral stenosis.   6. The aortic valve is tricuspid. Aortic valve regurgitation is not  visualized. Aortic valve sclerosis/calcification is present, without any  evidence of aortic stenosis.    Physical Exam:   VS:  BP (!) 142/70   Pulse 78   Ht 5' (1.524 m)   Wt 188 lb (85.3 kg)   LMP 09/22/2019 (Approximate)   SpO2 95%   BMI 36.72 kg/m    Wt Readings from Last 3 Encounters:  08/23/22 188 lb (85.3 kg)  08/04/22 187 lb (84.8 kg)  03/18/22 195 lb (88.5 kg)     GEN: Well nourished, well developed female appearing in no acute distress NECK: No JVD; No carotid bruits CARDIAC: RRR, no murmurs, rubs, gallops RESPIRATORY:  Clear to auscultation without rales, wheezing or rhonchi  ABDOMEN: Appears non-distended. No obvious abdominal masses. EXTREMITIES: No clubbing or cyanosis. No pitting edema.  Distal pedal pulses are 2+ bilaterally.   Assessment and Plan:   1. Chronic HFpEF - Her weight has actually declined by 7 pounds since her last office visit and she denies any recent respiratory issues. She appears euvolemic by examination today. Diuretic therapy has been deferred to Nephrology. The patient and her family members were unsure of her current Lasix dose and this  was verified with pharmacy as she has been taking Lasix 80 mg twice daily.  - Continue Amlodipine, Coreg and Hydralazine. She has not been on an SGLT2 inhibitor as her GFR was at 9 when checked in 06/2022.  2. HTN - BP is elevated at 142/70 and was actually higher at 152/78 on recheck. She has only been taking Coreg 25 mg once daily and we reviewed the importance of taking this as a twice daily medication. Will continue  Amlodipine 10 mg daily and Hydralazine 100 mg TID.  3. HLD - FLP earlier this month showed total cholesterol 133, triglycerides 119, HDL 42 and LDL 70. Continue current medical therapy with Crestor 40 mg daily.  4. Stage 5 CKD - Followed by Dr. Wolfgang Phoenix. Creatinine was at 5.19 when checked in 06/2022.  Signed, Ellsworth Lennox, PA-C

## 2022-08-23 NOTE — Telephone Encounter (Signed)
Spoke with the patient .Patient would like to go ahead and take GLP 1 to be sent to [facility] on [address] and prefers this over insulin.

## 2022-08-24 ENCOUNTER — Other Ambulatory Visit: Payer: Self-pay | Admitting: Family Medicine

## 2022-08-24 DIAGNOSIS — I129 Hypertensive chronic kidney disease with stage 1 through stage 4 chronic kidney disease, or unspecified chronic kidney disease: Secondary | ICD-10-CM | POA: Diagnosis not present

## 2022-08-24 DIAGNOSIS — N2581 Secondary hyperparathyroidism of renal origin: Secondary | ICD-10-CM | POA: Diagnosis not present

## 2022-08-24 DIAGNOSIS — E1165 Type 2 diabetes mellitus with hyperglycemia: Secondary | ICD-10-CM | POA: Diagnosis not present

## 2022-08-24 DIAGNOSIS — N185 Chronic kidney disease, stage 5: Secondary | ICD-10-CM | POA: Diagnosis not present

## 2022-08-24 DIAGNOSIS — E559 Vitamin D deficiency, unspecified: Secondary | ICD-10-CM | POA: Diagnosis not present

## 2022-08-24 DIAGNOSIS — D631 Anemia in chronic kidney disease: Secondary | ICD-10-CM | POA: Diagnosis not present

## 2022-08-24 LAB — POCT HEMOGLOBIN-HEMACUE: Hemoglobin: 10.3 g/dL — ABNORMAL LOW (ref 12.0–15.0)

## 2022-08-24 MED ORDER — INSULIN PEN NEEDLE 31G X 4 MM MISC: 3 refills | Status: DC

## 2022-08-24 MED ORDER — INSULIN GLARGINE 100 UNIT/ML SOLOSTAR PEN: 10.0000 [IU] | PEN_INJECTOR | Freq: Every day | SUBCUTANEOUS | 1 refills | Status: DC

## 2022-08-24 NOTE — Telephone Encounter (Signed)
Spoke with patient and informed per drs recommendations. Patient would like to have regular daily use insulin to be sent to [facility].

## 2022-08-25 DIAGNOSIS — E1165 Type 2 diabetes mellitus with hyperglycemia: Secondary | ICD-10-CM | POA: Diagnosis not present

## 2022-08-25 NOTE — Telephone Encounter (Signed)
Patient is made aware per drs orders and recommendations

## 2022-08-26 DIAGNOSIS — E1165 Type 2 diabetes mellitus with hyperglycemia: Secondary | ICD-10-CM | POA: Diagnosis not present

## 2022-08-27 DIAGNOSIS — E1165 Type 2 diabetes mellitus with hyperglycemia: Secondary | ICD-10-CM | POA: Diagnosis not present

## 2022-08-28 ENCOUNTER — Telehealth (HOSPITAL_COMMUNITY): Payer: Self-pay | Admitting: *Deleted

## 2022-08-28 DIAGNOSIS — E1165 Type 2 diabetes mellitus with hyperglycemia: Secondary | ICD-10-CM | POA: Diagnosis not present

## 2022-08-28 NOTE — Telephone Encounter (Signed)
Received fax from Ana [name] requesting permanent catheter placement and verbal request to proceed with AVG placement. Will give to [facility].

## 2022-08-29 ENCOUNTER — Other Ambulatory Visit: Payer: Self-pay

## 2022-08-29 DIAGNOSIS — E1165 Type 2 diabetes mellitus with hyperglycemia: Secondary | ICD-10-CM | POA: Diagnosis not present

## 2022-08-29 DIAGNOSIS — N184 Chronic kidney disease, stage 4 (severe): Secondary | ICD-10-CM

## 2022-08-30 DIAGNOSIS — E1165 Type 2 diabetes mellitus with hyperglycemia: Secondary | ICD-10-CM | POA: Diagnosis not present

## 2022-08-31 DIAGNOSIS — E1165 Type 2 diabetes mellitus with hyperglycemia: Secondary | ICD-10-CM | POA: Diagnosis not present

## 2022-09-01 ENCOUNTER — Encounter (HOSPITAL_COMMUNITY): Payer: Self-pay | Admitting: Vascular Surgery

## 2022-09-01 DIAGNOSIS — E1165 Type 2 diabetes mellitus with hyperglycemia: Secondary | ICD-10-CM | POA: Diagnosis not present

## 2022-09-01 NOTE — Progress Notes (Signed)
SDW call  Patient was given pre-op instructions over the phone. Patient verbalized understanding of instructions provided.    PCP - Dr. Everlene Other Cardiologist - Dr. Dietrich Pates Pulmonary:    PPM/ICD - denies   Chest x-ray - 12/21/2021 EKG -  03/18/2022 Stress Test - ECHO - 09/16/2020 Cardiac Cath -   Sleep Study/sleep apnea/CPAP: denies  Type II diabetic:  Fasting Blood sugar range: unknown How often check sugars: states she does not check her blood sugars Denies being on any medication for diabetes   Blood Thinner Instructions: denies Aspirin Instructions:denies   ERAS Protcol - No, NPO PRE-SURGERY Ensure or G2-    COVID TEST- n/a    Anesthesia review: Yes. HTN, DM, CKD   Patient denies shortness of breath, fever, cough and chest pain over the phone call  Your procedure is scheduled on Tuesday July 9, 20224  Report to [facility] Main Entrance "A" at 0530  A.M., then check in with the Admitting office.  Call this number if you have problems the morning of surgery:  [phone]   If you have any questions prior to your surgery date call [phone]: Open Monday-Friday 8am-4pm If you experience any cold or flu symptoms such as cough, fever, chills, shortness of breath, etc. between now and your scheduled surgery, please notify us at the above number    Remember:  Do not eat or drink after midnight the night before your surgery   Take these medicines the morning of surgery with A SIP OF WATER:  Amlodipine, carvedilol, hydralazine, levothyroxine  As of today, STOP taking any Aspirin (unless otherwise instructed by your surgeon) Aleve, Naproxen, Ibuprofen, Motrin, Advil, Goody's, BC's, all herbal medications, fish oil, and all vitamins.

## 2022-09-02 DIAGNOSIS — E1165 Type 2 diabetes mellitus with hyperglycemia: Secondary | ICD-10-CM | POA: Diagnosis not present

## 2022-09-03 DIAGNOSIS — E1165 Type 2 diabetes mellitus with hyperglycemia: Secondary | ICD-10-CM | POA: Diagnosis not present

## 2022-09-04 DIAGNOSIS — E1165 Type 2 diabetes mellitus with hyperglycemia: Secondary | ICD-10-CM | POA: Diagnosis not present

## 2022-09-04 NOTE — Progress Notes (Signed)
Anesthesia Chart Review: SAME DAY WORK-UP  Case: [id number] Date/Time: 09/05/22 0715   Procedures:      INSERTION OF LEFT UPPER ARM ARTERIOVENOUS (AV) GORE-TEX GRAFT (Left)     INSERTION OF TUNNELED DIALYSIS CATHETER   Anesthesia type: Monitor Anesthesia Care   Pre-op diagnosis: CKD IV   Location: [facility] ROOM 16 / [facility] OR   Surgeons: Victorino Sparrow, MD       DISCUSSION: Patient is a 52 year old female scheduled for the above procedure. She was initially seen by vascular surgeon Dr. Tawanna Cooler Early on 03/01/22 for LUE AVGG versus AVF. She was not felt to be a AVF candidate, so her nephrologist Dr Wolfgang Phoenix recommended to postpone HD access until she was ready to start hemodialysis.   History includes never smoker, DM2, HTN, CKD (stage IV-V), HFpEF, anemia of chronic disease (/sp Retacrit), Visually impaired per 08/04/22 office note by Dr. Adriana Simas.    Last cardiology visit was on 08/23/22 by Randall An, PA-C for follow-up HTN, HFpEF. She was overall doing well since her last office visit without hospitalizations. Nephrology had been management diuretic therapy, and her weight was actually down 7 lbs. BP 142/7, 152/78. She had inadvertently been taking Coreg only once a day, so she discussed taking BID as prescribed. On Crestor for HLD.  No chest pain, palpitations, dyspnea on exertion, orthopnea, PND or pitting edema. Six month follow-up planned.  Established with PCP [name], DO on 08/04/22. She was not on medication for DM then and A1c 10.9%. He prescribed Lantus 10 units but she has not started and does not check CBGs at home. Glucose results in [facility] were 346 on 05/17/22, 299 on 07/12/22, 265 on 08/23/22.    As of 08/29/22, her DM was still untreated as she had not started Lantus that was prescribed last month. Labs on 08/23/22 showed glucose 265, Creatinine 6.54. Plans to proceed as scheduled may depend on day of surgery glucose results and urgency of the procedure--although appears case is time sensitive  since she is also scheduled for new catheter placement. Discussed with Dr. Karin Lieu and anesthesiologist Shona Simpson, MD. She will get labs on arrival. Definitive plan to be determined on the day of surgery.    VS: LMP 09/22/2019 (Approximate)  BP Readings from Last 3 Encounters:  08/23/22 (!) 159/68  08/23/22 (!) 142/70  08/22/22 (!) 151/71   Pulse Readings from Last 3 Encounters:  08/23/22 75  08/23/22 78  08/22/22 97    PROVIDERS: [name], DO is PCP Dietrich Pates, MD is cardiologist Purcell Nails, MD is endocrinologist Celso Amy, MD is nephrologist   LABS: Most recent lab results in [facility] include: Lab Results  Component Value Date   WBC 7.9 08/23/2022   HGB 10.3 (L) 08/23/2022   HCT 31.4 (L) 08/23/2022   PLT 290 08/23/2022   GLUCOSE 265 (H) 08/23/2022   CHOL 133 08/04/2022   TRIG 119 08/04/2022   HDL 42 08/04/2022   LDLCALC 70 08/04/2022   ALT 34 03/18/2022   AST 36 03/18/2022   NA 135 08/23/2022   K 3.6 08/23/2022   CL 99 08/23/2022   CREATININE 6.54 (H) 08/23/2022   BUN 80 (H) 08/23/2022   CO2 25 08/23/2022   TSH 2.013 03/18/2022   INR 1.0 12/22/2021   HGBA1C 10.9 (H) 08/04/2022    IMAGES: 1V PCXR 12/21/21: IMPRESSION: 1. Cardiomegaly and pulmonary edema. 2. More focal bibasilar pulmonary opacities are nonspecific and may represent a combination of pleural effusion and  atelectasis in the setting of low lung volumes and pulmonary edema.     EKG: 03/18/22:  Sinus rhythm ST elev, probable normal early repol pattern Prolonged QT interval Confirmed by Gloris Manchester [id number]) on 03/18/2022 3:56:41 PM   CV: Echo 11/14/21: IMPRESSIONS   1. Left ventricular ejection fraction, by estimation, is 60 to 65%. The  left ventricle has normal function. The left ventricle has no regional  wall motion abnormalities. Left ventricular diastolic parameters are  consistent with Grade II diastolic  dysfunction (pseudonormalization). Elevated left ventricular  end-diastolic  pressure.   2. Right ventricular systolic function was not well visualized. The right  ventricular size is not well visualized. Tricuspid regurgitation signal is  inadequate for assessing PA pressure.   3. Left atrial size was moderately dilated.   4. A small pericardial effusion is present. The pericardial effusion is  circumferential. There is no evidence of cardiac tamponade.   5. The mitral valve is normal in structure. No evidence of mitral valve  regurgitation. No evidence of mitral stenosis.   6. The aortic valve is tricuspid. Aortic valve regurgitation is not  visualized. Aortic valve sclerosis/calcification is present, without any  evidence of aortic stenosis.    Past Medical History:  Diagnosis Date   (HFpEF) heart failure with preserved ejection fraction (HCC)    a. echo in 10/2021 showing preserved EF of 60-65% with Grade 2 DD and small pericardial effusion.   CKD (chronic kidney disease)    Diabetes mellitus    diet controlled   Hypertension     Past Surgical History:  Procedure Laterality Date   CESAREAN SECTION  x4   CHOLECYSTECTOMY     EYE SURGERY     MASS EXCISION  10/27/2011   Procedure: EXCISION MASS;  Surgeon: Fabio Bering, MD;  Location: [facility]S;  Service: General;  Laterality: Right;   TUBAL LIGATION      MEDICATIONS: No current facility-administered medications for this encounter.    amLODipine (NORVASC) 10 MG tablet   calcitRIOL (ROCALTROL) 0.25 MCG capsule   carvedilol (COREG) 25 MG tablet   furosemide (LASIX) 40 MG tablet   hydrALAZINE (APRESOLINE) 100 MG tablet   levothyroxine (SYNTHROID) 50 MCG tablet   rosuvastatin (CRESTOR) 40 MG tablet   sodium bicarbonate 650 MG tablet   insulin glargine (LANTUS) 100 UNIT/ML Solostar Pen   Insulin Pen Needle 31G X 4 MM MISC   permethrin (ELIMITE) 5 % cream   triamcinolone (KENALOG) 0.025 % ointment    Shonna Chock, PA-C Surgical Short Stay/Anesthesiology [facility] Phone [phone] [facility] Phone [phone] 09/04/2022 1:49 PM

## 2022-09-04 NOTE — Anesthesia Preprocedure Evaluation (Addendum)
Anesthesia Evaluation  Patient identified by MRN, date of birth, ID band Patient awake    Reviewed: Allergy & Precautions, H&P , NPO status , Patient's Chart, lab work & pertinent test results  Airway Mallampati: III  TM Distance: >3 FB Neck ROM: Full    Dental no notable dental hx. (+) Teeth Intact, Dental Advisory Given   Pulmonary neg pulmonary ROS   Pulmonary exam normal breath sounds clear to auscultation       Cardiovascular hypertension, Pt. on medications  Rhythm:Regular Rate:Normal     Neuro/Psych negative neurological ROS  negative psych ROS   GI/Hepatic negative GI ROS, Neg liver ROS,,,  Endo/Other  diabetes, Insulin DependentHypothyroidism  Morbid obesity  Renal/GU ESRF and DialysisRenal disease  negative genitourinary   Musculoskeletal   Abdominal   Peds  Hematology  (+) Blood dyscrasia, anemia   Anesthesia Other Findings   Reproductive/Obstetrics negative OB ROS                             Anesthesia Physical Anesthesia Plan  ASA: 3  Anesthesia Plan: General   Post-op Pain Management: Tylenol PO (pre-op)*   Induction: Intravenous  PONV Risk Score and Plan: 4 or greater and Ondansetron, Dexamethasone, Propofol infusion, TIVA and Midazolam  Airway Management Planned: LMA  Additional Equipment:   Intra-op Plan:   Post-operative Plan: Extubation in OR  Informed Consent: I have reviewed the patients History and Physical, chart, labs and discussed the procedure including the risks, benefits and alternatives for the proposed anesthesia with the patient or authorized representative who has indicated his/her understanding and acceptance.     Dental advisory given  Plan Discussed with: CRNA  Anesthesia Plan Comments: (See PAT note written 09/04/2022 by Shonna Chock, PA-C. Procedure scheduled for impending hemodialysis. A1c 10.9%, prescribed Lantus last month but had  not started as of 08/29/22.   )       Anesthesia Quick Evaluation

## 2022-09-05 ENCOUNTER — Ambulatory Visit (HOSPITAL_COMMUNITY)
Admission: RE | Admit: 2022-09-05 | Discharge: 2022-09-05 | Disposition: A | Discharge status: Home / Self Care | Payer: Medicaid Other | Source: Ambulatory Visit | Attending: Vascular Surgery | Admitting: Vascular Surgery

## 2022-09-05 ENCOUNTER — Ambulatory Visit (HOSPITAL_BASED_OUTPATIENT_CLINIC_OR_DEPARTMENT_OTHER): Payer: Medicaid Other | Admitting: Vascular Surgery

## 2022-09-05 ENCOUNTER — Other Ambulatory Visit: Payer: Self-pay

## 2022-09-05 ENCOUNTER — Encounter (HOSPITAL_COMMUNITY)
Admission: RE | Disposition: A | Discharge status: Home / Self Care | Payer: Self-pay | Source: Ambulatory Visit | Attending: Vascular Surgery

## 2022-09-05 ENCOUNTER — Ambulatory Visit (HOSPITAL_COMMUNITY): Payer: Medicaid Other | Admitting: Vascular Surgery

## 2022-09-05 ENCOUNTER — Ambulatory Visit (HOSPITAL_COMMUNITY): Payer: Medicaid Other

## 2022-09-05 ENCOUNTER — Encounter (HOSPITAL_COMMUNITY): Payer: Self-pay | Admitting: Vascular Surgery

## 2022-09-05 DIAGNOSIS — Z992 Dependence on renal dialysis: Secondary | ICD-10-CM

## 2022-09-05 DIAGNOSIS — Z79899 Other long term (current) drug therapy: Secondary | ICD-10-CM | POA: Diagnosis not present

## 2022-09-05 DIAGNOSIS — R0989 Other specified symptoms and signs involving the circulatory and respiratory systems: Secondary | ICD-10-CM | POA: Diagnosis not present

## 2022-09-05 DIAGNOSIS — D631 Anemia in chronic kidney disease: Secondary | ICD-10-CM | POA: Insufficient documentation

## 2022-09-05 DIAGNOSIS — E1122 Type 2 diabetes mellitus with diabetic chronic kidney disease: Secondary | ICD-10-CM | POA: Diagnosis not present

## 2022-09-05 DIAGNOSIS — I082 Rheumatic disorders of both aortic and tricuspid valves: Secondary | ICD-10-CM | POA: Insufficient documentation

## 2022-09-05 DIAGNOSIS — N185 Chronic kidney disease, stage 5: Secondary | ICD-10-CM | POA: Diagnosis not present

## 2022-09-05 DIAGNOSIS — Z794 Long term (current) use of insulin: Secondary | ICD-10-CM | POA: Diagnosis not present

## 2022-09-05 DIAGNOSIS — I120 Hypertensive chronic kidney disease with stage 5 chronic kidney disease or end stage renal disease: Secondary | ICD-10-CM | POA: Diagnosis not present

## 2022-09-05 DIAGNOSIS — E039 Hypothyroidism, unspecified: Secondary | ICD-10-CM | POA: Diagnosis not present

## 2022-09-05 DIAGNOSIS — I509 Heart failure, unspecified: Secondary | ICD-10-CM | POA: Diagnosis not present

## 2022-09-05 DIAGNOSIS — N186 End stage renal disease: Secondary | ICD-10-CM | POA: Diagnosis not present

## 2022-09-05 DIAGNOSIS — E1165 Type 2 diabetes mellitus with hyperglycemia: Secondary | ICD-10-CM | POA: Diagnosis not present

## 2022-09-05 DIAGNOSIS — I3139 Other pericardial effusion (noninflammatory): Secondary | ICD-10-CM | POA: Insufficient documentation

## 2022-09-05 DIAGNOSIS — I517 Cardiomegaly: Secondary | ICD-10-CM | POA: Diagnosis not present

## 2022-09-05 DIAGNOSIS — I132 Hypertensive heart and chronic kidney disease with heart failure and with stage 5 chronic kidney disease, or end stage renal disease: Secondary | ICD-10-CM | POA: Insufficient documentation

## 2022-09-05 DIAGNOSIS — I5032 Chronic diastolic (congestive) heart failure: Secondary | ICD-10-CM | POA: Diagnosis not present

## 2022-09-05 DIAGNOSIS — Z452 Encounter for adjustment and management of vascular access device: Secondary | ICD-10-CM | POA: Diagnosis not present

## 2022-09-05 DIAGNOSIS — J90 Pleural effusion, not elsewhere classified: Secondary | ICD-10-CM | POA: Diagnosis not present

## 2022-09-05 DIAGNOSIS — N184 Chronic kidney disease, stage 4 (severe): Secondary | ICD-10-CM

## 2022-09-05 DIAGNOSIS — E782 Mixed hyperlipidemia: Secondary | ICD-10-CM | POA: Diagnosis not present

## 2022-09-05 HISTORY — PX: AV FISTULA PLACEMENT: SHX1204

## 2022-09-05 HISTORY — PX: INSERTION OF DIALYSIS CATHETER: SHX1324

## 2022-09-05 LAB — POCT I-STAT, CHEM 8
BUN: 79 mg/dL — ABNORMAL HIGH (ref 6–20)
Calcium, Ion: 1.11 mmol/L — ABNORMAL LOW (ref 1.15–1.40)
Chloride: 98 mmol/L (ref 98–111)
Creatinine, Ser: 6.8 mg/dL — ABNORMAL HIGH (ref 0.44–1.00)
Glucose, Bld: 378 mg/dL — ABNORMAL HIGH (ref 70–99)
HCT: 32 % — ABNORMAL LOW (ref 36.0–46.0)
Hemoglobin: 10.9 g/dL — ABNORMAL LOW (ref 12.0–15.0)
Potassium: 4.3 mmol/L (ref 3.5–5.1)
Sodium: 135 mmol/L (ref 135–145)
TCO2: 26 mmol/L (ref 22–32)

## 2022-09-05 LAB — GLUCOSE, CAPILLARY
Glucose-Capillary: 328 mg/dL — ABNORMAL HIGH (ref 70–99)
Glucose-Capillary: 327 mg/dL — ABNORMAL HIGH (ref 70–99)
Glucose-Capillary: 319 mg/dL — ABNORMAL HIGH (ref 70–99)

## 2022-09-05 SURGERY — INSERTION OF ARTERIOVENOUS (AV) GORE-TEX GRAFT ARM
Anesthesia: General | Site: Neck | Laterality: Right

## 2022-09-05 MED ORDER — MIDAZOLAM HCL 2 MG/2ML IJ SOLN
INTRAMUSCULAR | Status: DC | PRN
  Administered 2022-09-05: 1 mg via INTRAVENOUS

## 2022-09-05 MED ORDER — AMISULPRIDE (ANTIEMETIC) 5 MG/2ML IV SOLN
INTRAVENOUS | Status: AC
  Filled 2022-09-05: qty 4

## 2022-09-05 MED ORDER — MIDAZOLAM HCL 2 MG/2ML IJ SOLN
INTRAMUSCULAR | Status: AC
  Filled 2022-09-05: qty 2

## 2022-09-05 MED ORDER — PROPOFOL 10 MG/ML IV BOLUS
INTRAVENOUS | Status: DC | PRN
  Administered 2022-09-05: 150 mg via INTRAVENOUS

## 2022-09-05 MED ORDER — SODIUM CHLORIDE 0.9 % IV SOLN: INTRAVENOUS | Status: DC

## 2022-09-05 MED ORDER — LACTATED RINGERS IV SOLN: INTRAVENOUS | Status: DC

## 2022-09-05 MED ORDER — HEPARIN SODIUM (PORCINE) 1000 UNIT/ML IJ SOLN
INTRAMUSCULAR | Status: DC | PRN
  Administered 2022-09-05: 3200 [IU]

## 2022-09-05 MED ORDER — CEFAZOLIN SODIUM-DEXTROSE 2-4 GM/100ML-% IV SOLN
2.0000 g | INTRAVENOUS | Status: AC
  Administered 2022-09-05: 2 g via INTRAVENOUS
  Filled 2022-09-05: qty 100

## 2022-09-05 MED ORDER — HYDROMORPHONE HCL 1 MG/ML IJ SOLN
INTRAMUSCULAR | Status: AC
  Filled 2022-09-05: qty 1

## 2022-09-05 MED ORDER — PHENYLEPHRINE 80 MCG/ML (10ML) SYRINGE FOR IV PUSH (FOR BLOOD PRESSURE SUPPORT)
PREFILLED_SYRINGE | INTRAVENOUS | Status: DC | PRN
  Administered 2022-09-05 (×2): 80 ug via INTRAVENOUS

## 2022-09-05 MED ORDER — HEPARIN 6000 UNIT IRRIGATION SOLUTION
Status: DC | PRN
  Administered 2022-09-05: 1

## 2022-09-05 MED ORDER — PROPOFOL 10 MG/ML IV BOLUS
INTRAVENOUS | Status: AC
  Filled 2022-09-05: qty 20

## 2022-09-05 MED ORDER — LIDOCAINE 2% (20 MG/ML) 5 ML SYRINGE
INTRAMUSCULAR | Status: AC
  Filled 2022-09-05: qty 5

## 2022-09-05 MED ORDER — HEPARIN 6000 UNIT IRRIGATION SOLUTION
Status: AC
  Filled 2022-09-05: qty 500

## 2022-09-05 MED ORDER — FENTANYL CITRATE (PF) 250 MCG/5ML IJ SOLN
INTRAMUSCULAR | Status: DC | PRN
  Administered 2022-09-05: 50 ug via INTRAVENOUS
  Administered 2022-09-05: 25 ug via INTRAVENOUS

## 2022-09-05 MED ORDER — HYDROMORPHONE HCL 1 MG/ML IJ SOLN
0.2500 mg | INTRAMUSCULAR | Status: DC | PRN
  Administered 2022-09-05: 0.5 mg via INTRAVENOUS

## 2022-09-05 MED ORDER — LIDOCAINE-EPINEPHRINE (PF) 1 %-1:200000 IJ SOLN
INTRAMUSCULAR | Status: AC
  Filled 2022-09-05: qty 30

## 2022-09-05 MED ORDER — AMISULPRIDE (ANTIEMETIC) 5 MG/2ML IV SOLN
10.0000 mg | Freq: Once | INTRAVENOUS | Status: AC
  Administered 2022-09-05: 10 mg via INTRAVENOUS
  Filled 2022-09-05: qty 4

## 2022-09-05 MED ORDER — ONDANSETRON HCL 4 MG/2ML IJ SOLN
INTRAMUSCULAR | Status: DC | PRN
  Administered 2022-09-05: 4 mg via INTRAVENOUS

## 2022-09-05 MED ORDER — CHLORHEXIDINE GLUCONATE 4 % EX SOLN: 60.0000 mL | Freq: Once | CUTANEOUS | Status: DC

## 2022-09-05 MED ORDER — ONDANSETRON HCL 4 MG/2ML IJ SOLN
INTRAMUSCULAR | Status: AC
  Filled 2022-09-05: qty 2

## 2022-09-05 MED ORDER — CHLORHEXIDINE GLUCONATE 0.12 % MT SOLN: 15.0000 mL | Freq: Once | OROMUCOSAL | Status: AC

## 2022-09-05 MED ORDER — PHENYLEPHRINE 80 MCG/ML (10ML) SYRINGE FOR IV PUSH (FOR BLOOD PRESSURE SUPPORT)
PREFILLED_SYRINGE | INTRAVENOUS | Status: AC
  Filled 2022-09-05: qty 10

## 2022-09-05 MED ORDER — ACETAMINOPHEN 500 MG PO TABS
1000.0000 mg | ORAL_TABLET | Freq: Once | ORAL | Status: AC
  Administered 2022-09-05: 1000 mg via ORAL
  Filled 2022-09-05: qty 2

## 2022-09-05 MED ORDER — CHLORHEXIDINE GLUCONATE 0.12 % MT SOLN
OROMUCOSAL | Status: AC
  Administered 2022-09-05: 15 mL via OROMUCOSAL
  Filled 2022-09-05: qty 15

## 2022-09-05 MED ORDER — FENTANYL CITRATE (PF) 250 MCG/5ML IJ SOLN
INTRAMUSCULAR | Status: AC
  Filled 2022-09-05: qty 5

## 2022-09-05 MED ORDER — ORAL CARE MOUTH RINSE: 15.0000 mL | Freq: Once | OROMUCOSAL | Status: AC

## 2022-09-05 MED ORDER — HEPARIN SODIUM (PORCINE) 1000 UNIT/ML IJ SOLN
INTRAMUSCULAR | Status: AC
  Filled 2022-09-05: qty 10

## 2022-09-05 MED ORDER — 0.9 % SODIUM CHLORIDE (POUR BTL) OPTIME
TOPICAL | Status: DC | PRN
  Administered 2022-09-05: 1000 mL

## 2022-09-05 MED ORDER — LIDOCAINE HCL (PF) 1 % IJ SOLN
INTRAMUSCULAR | Status: AC
  Filled 2022-09-05: qty 30

## 2022-09-05 MED ORDER — INSULIN ASPART 100 UNIT/ML IJ SOLN
0.0000 [IU] | INTRAMUSCULAR | Status: DC | PRN
  Administered 2022-09-05: 5 [IU] via SUBCUTANEOUS
  Filled 2022-09-05: qty 1

## 2022-09-05 MED ORDER — OXYCODONE-ACETAMINOPHEN 5-325 MG PO TABS: 1.0000 | ORAL_TABLET | Freq: Four times a day (QID) | ORAL | 0 refills | Status: DC | PRN

## 2022-09-05 MED ORDER — LIDOCAINE 2% (20 MG/ML) 5 ML SYRINGE
INTRAMUSCULAR | Status: DC | PRN
  Administered 2022-09-05: 60 mg via INTRAVENOUS

## 2022-09-05 MED ORDER — INSULIN ASPART 100 UNIT/ML IJ SOLN
8.0000 [IU] | Freq: Once | INTRAMUSCULAR | Status: AC
  Administered 2022-09-05: 8 [IU] via SUBCUTANEOUS

## 2022-09-05 SURGICAL SUPPLY — 69 items
ADH SKN CLS APL DERMABOND .7 (GAUZE/BANDAGES/DRESSINGS) ×2
ARMBAND PINK RESTRICT EXTREMIT (MISCELLANEOUS) ×3 IMPLANT
BAG COUNTER SPONGE SURGICOUNT (BAG) ×3 IMPLANT
BAG DECANTER FOR FLEXI CONT (MISCELLANEOUS) ×2 IMPLANT
BAG SPNG CNTER NS LX DISP (BAG) ×2
BIOPATCH RED 1 DISK 7.0 (GAUZE/BANDAGES/DRESSINGS) ×2 IMPLANT
BLADE CLIPPER SURG (BLADE) ×3 IMPLANT
BNDG ELASTIC 4X5.8 VLCR STR LF (GAUZE/BANDAGES/DRESSINGS) ×3 IMPLANT
CANISTER SUCT 3000ML PPV (MISCELLANEOUS) ×3 IMPLANT
CATH PALINDROME-P 19CM W/VT (CATHETERS) IMPLANT
CATH PALINDROME-P 23CM W/VT (CATHETERS) IMPLANT
CATH PALINDROME-P 28CM W/VT (CATHETERS) IMPLANT
CATH STRAIGHT 5FR 65CM (CATHETERS) IMPLANT
CLIP TI MEDIUM 24 (CLIP) ×3 IMPLANT
CLIP TI MEDIUM 6 (CLIP) ×6 IMPLANT
CLIP TI WIDE RED SMALL 24 (CLIP) ×2 IMPLANT
CLIP TI WIDE RED SMALL 6 (CLIP) ×3 IMPLANT
COVER PROBE W GEL 5X96 (DRAPES) ×2 IMPLANT
COVER SURGICAL LIGHT HANDLE (MISCELLANEOUS) ×3 IMPLANT
DERMABOND ADVANCED .7 DNX12 (GAUZE/BANDAGES/DRESSINGS) ×3 IMPLANT
DRAPE C-ARM 42X72 X-RAY (DRAPES) ×3 IMPLANT
DRAPE CHEST BREAST 15X10 FENES (DRAPES) ×3 IMPLANT
DRAPE ORTHO SPLIT 77X108 STRL (DRAPES) ×2
DRAPE SURG ORHT 6 SPLT 77X108 (DRAPES) IMPLANT
ELECT REM PT RETURN 9FT ADLT (ELECTROSURGICAL) ×2
ELECTRODE REM PT RTRN 9FT ADLT (ELECTROSURGICAL) ×2 IMPLANT
GAUZE 4X4 16PLY ~~LOC~~+RFID DBL (SPONGE) ×3 IMPLANT
GLOVE BIOGEL PI IND STRL 8 (GLOVE) ×3 IMPLANT
GOWN STRL REUS W/ TWL LRG LVL3 (GOWN DISPOSABLE) ×6 IMPLANT
GOWN STRL REUS W/TWL 2XL LVL3 (GOWN DISPOSABLE) ×6 IMPLANT
GOWN STRL REUS W/TWL LRG LVL3 (GOWN DISPOSABLE) ×4
KIT BASIN OR (CUSTOM PROCEDURE TRAY) ×3 IMPLANT
KIT PALINDROME-P 55CM (CATHETERS) IMPLANT
KIT TURNOVER KIT B (KITS) ×2 IMPLANT
LOOP VASCULAR MINI 18 RED (MISCELLANEOUS) ×2
NDL 18GX1X1/2 (RX/OR ONLY) (NEEDLE) ×3 IMPLANT
NDL HYPO 25GX1X1/2 BEV (NEEDLE) ×3 IMPLANT
NEEDLE 18GX1X1/2 (RX/OR ONLY) (NEEDLE) ×2 IMPLANT
NEEDLE HYPO 25GX1X1/2 BEV (NEEDLE) ×2 IMPLANT
NS IRRIG 1000ML POUR BTL (IV SOLUTION) ×3 IMPLANT
PACK BASIC III (CUSTOM PROCEDURE TRAY) ×2
PACK CV ACCESS (CUSTOM PROCEDURE TRAY) ×2 IMPLANT
PACK SRG BSC III STRL LF ECLPS (CUSTOM PROCEDURE TRAY) ×2 IMPLANT
PAD ARMBOARD 7.5X6 YLW CONV (MISCELLANEOUS) ×6 IMPLANT
SET MICROPUNCTURE 5F STIFF (MISCELLANEOUS) IMPLANT
SLING ARM FOAM STRAP LRG (SOFTGOODS) IMPLANT
SLING ARM FOAM STRAP MED (SOFTGOODS) IMPLANT
SOAP 2 % CHG 4 OZ (WOUND CARE) ×3 IMPLANT
SPIKE FLUID TRANSFER (MISCELLANEOUS) ×3 IMPLANT
SUT ETHILON 3 0 PS 1 (SUTURE) ×2 IMPLANT
SUT GORETEX 6.0 TT13 (SUTURE) IMPLANT
SUT MNCRL AB 4-0 PS2 18 (SUTURE) ×3 IMPLANT
SUT PROLENE 6 0 BV (SUTURE) ×3 IMPLANT
SUT PROLENE 7 0 BV 1 (SUTURE) IMPLANT
SUT SILK 2 0 PERMA HAND 18 BK (SUTURE) IMPLANT
SUT SILK 3 0 SH CR/8 (SUTURE) ×3 IMPLANT
SUT VIC AB 3-0 SH 27 (SUTURE) ×4
SUT VIC AB 3-0 SH 27X BRD (SUTURE) ×6 IMPLANT
SYR 10ML LL (SYRINGE) ×3 IMPLANT
SYR 20ML LL LF (SYRINGE) ×6 IMPLANT
SYR 5ML LL (SYRINGE) ×2 IMPLANT
SYR CONTROL 10ML LL (SYRINGE) ×3 IMPLANT
TOWEL GREEN STERILE (TOWEL DISPOSABLE) ×2 IMPLANT
TOWEL GREEN STERILE FF (TOWEL DISPOSABLE) ×3 IMPLANT
UNDERPAD 30X36 HEAVY ABSORB (UNDERPADS AND DIAPERS) ×2 IMPLANT
VASCULAR TIE MINI RED 18IN STL (MISCELLANEOUS) IMPLANT
WATER STERILE IRR 1000ML POUR (IV SOLUTION) ×3 IMPLANT
WIRE AMPLATZ SS-J .035X180CM (WIRE) IMPLANT
WIRE STARTER BENTSON 035X150 (WIRE) IMPLANT

## 2022-09-05 NOTE — Progress Notes (Signed)
Dr. Sandford Craze made aware that patient's CBG was 328 upon arrival to Short Stay. Per Dr. Jean Rosenthal proceed with the Periop Glycemic Control Protocol.

## 2022-09-05 NOTE — Op Note (Signed)
    NAME: Ana [name]    MRN: [id number] DOB: 16-Jan-1971    DATE OF OPERATION: 09/05/2022  PREOP DIAGNOSIS:    End stage renal disease requiring dialysis  POSTOP DIAGNOSIS:    Same  PROCEDURE:    Left arm brachiobasilic fistula  SURGEON: Victorino Sparrow  ASSIST: Nathanial Rancher  ANESTHESIA: General   EBL: 15ml  INDICATIONS:    Ana [name] is a 52 y.o. female with progressive renal disease now end stage requiring dialysis. After discussing the risk sand benefits of tunneled dialysis catheter placement and Fistula v AV graft creation, Ana [name] elected to proceed.   FINDINGS:   4mm basilic vien 3.54mm Brachial artery   TECHNIQUE:   Using ultrasound guidance the right internal jugular vein was accessed with micropuncture technique.  Through the micropuncture sheath a floppy J-wire was advanced into the superior vena cava.  A small incision was made around the skin access point.  A counterincision was made in the chest under the clavicle.  A 23 cm tunneled dialysis catheter was then tunneled under the skin, over the clavicle into the incision in the neck.  The access point was serially dilated under direct fluoroscopic guidance.  A peel-away sheath was introduced into the superior vena cava under fluoroscopic guidance.  The tunneling device was removed and the catheter fed through the peel-away sheath into the superior vena cava.  The peel-away sheath was removed and the catheter gently pulled back.  Adequate position was confirmed with x-ray.  The catheter was tested and found to flush and draw back well.  Catheter was heparin locked.  Caps were applied.  Catheter was sutured to the skin.  The neck incision was closed with 4-0 Monocryl.  Next I moved to fistula creation.  The basilic vein in the left arm was identified using ultrasound and appeared of sufficient size. A transverse incision was made above the elbow creese in the antecubital fossa. The basilic vein was identified and  isolated for 4 cm in length.  The bicipital aponeurosis was partially released and the brachial artery freed from its paired brachial veins and secured with a vessel loop. The patient was heparinized. The basilic vein was marked and ligated distally with 2-0 silk, then flushed with heparinized saline. Vascular clamps were placed proximally and distally on the brachial artery and a 5 mm arteriotomy  was created on the brachial artery. This was flushed with heparin saline. The vein was juxtaposed to the artery and an anastomosis was created using 6-0 Prolene.   Prior to completing the anastomsis, the vessels were flushed and the suture line was tied down. There was an excellent thrill in the basilic vein from the anastomosis into the upper arm. The patient had a 2+ radial pulse. The incision was irrigated and hemostasis acheived. The deeper tissue was closed with 3-0 Vicryl and the skin closed with 4-0 Monocryl.    Dermabond was applied the incisions. He was transferred to PACU in stable condition.      Ladonna Snide, MD [facility] DATE OF DICTATION:   09/05/2022

## 2022-09-05 NOTE — Transfer of Care (Signed)
Immediate Anesthesia Transfer of Care Note  Patient: Zamyrah Jasin Helzer  Procedure(s) Performed: LEFT BRACHIO BASILIC ARTERIOVENOUS (AV) FISTULA CREATION (Left: Arm Upper) INSERTION OF TUNNELED DIALYSIS CATHETER RIGHT INTERNAL JUIGULAR VEIN (Right: Neck)  Patient Location: PACU  Anesthesia Type:General  Level of Consciousness: drowsy and patient cooperative  Airway & Oxygen Therapy: Patient Spontanous Breathing  Post-op Assessment: Report given to RN  Post vital signs: Reviewed and stable  Last Vitals:  Vitals Value Taken Time  BP 155/75 09/05/22 0918  Temp    Pulse 71 09/05/22 0920  Resp 14 09/05/22 0920  SpO2 95 % 09/05/22 0920  Vitals shown include unvalidated device data.  Last Pain:  Vitals:   09/05/22 0622  TempSrc:   PainSc: 0-No pain      Patients Stated Pain Goal: 0 (09/05/22 0622)  Complications: No notable events documented.

## 2022-09-05 NOTE — Anesthesia Postprocedure Evaluation (Signed)
Anesthesia Post Note  [name]  Procedure(s) Performed: LEFT BRACHIO BASILIC ARTERIOVENOUS (AV) FISTULA CREATION (Left: Arm Upper) INSERTION OF TUNNELED DIALYSIS CATHETER RIGHT INTERNAL JUIGULAR VEIN (Right: Neck)     Patient location during evaluation: PACU Anesthesia Type: General Level of consciousness: awake and alert Pain management: pain level controlled Vital Signs Assessment: post-procedure vital signs reviewed and stable Respiratory status: spontaneous breathing, nonlabored ventilation and respiratory function stable Cardiovascular status: blood pressure returned to baseline and stable Postop Assessment: no apparent nausea or vomiting Anesthetic complications: no  No notable events documented.  Last Vitals:  Vitals:   09/05/22 1030 09/05/22 1045  BP: (!) 155/78 (!) 159/76  Pulse: 74 72  Resp: 13 15  Temp:    SpO2: 95% 94%    Last Pain:  Vitals:   09/05/22 1045  TempSrc:   PainSc: Asleep                 Marca Gadsby,W. EDMOND

## 2022-09-05 NOTE — Anesthesia Procedure Notes (Signed)
Procedure Name: LMA Insertion Date/Time: 09/05/2022 7:54 AM  Performed by: De Nurse, CRNAPre-anesthesia Checklist: Patient identified, Emergency Drugs available, Suction available and Patient being monitored Patient Re-evaluated:Patient Re-evaluated prior to induction Oxygen Delivery Method: Circle System Utilized Preoxygenation: Pre-oxygenation with 100% oxygen Induction Type: IV induction Ventilation: Mask ventilation without difficulty LMA: LMA inserted LMA Size: 4.0 Number of attempts: 1 Placement Confirmation: positive ETCO2 Tube secured with: Tape Dental Injury: Teeth and Oropharynx as per pre-operative assessment

## 2022-09-05 NOTE — H&P (Signed)
Hospital Consult   History of Present Illness: This is a 52 y.o. female with end-stage renal disease requiring long-term and short-term access for dialysis.  She was seen in our [place] office several months ago with vein mapping demonstrating no usable superficial veins bilaterally.  We are asked to hold off on AV graft.  She presents today for AV graft creation.  On exam, Ana [name] was doing well, accompanied by her sister.  She had no complaints, denies any sensory or motor deficits in the arms and hands no previous access surgery.  Past Medical History:  Diagnosis Date   (HFpEF) heart failure with preserved ejection fraction (HCC)    a. echo in 10/2021 showing preserved EF of 60-65% with Grade 2 DD and small pericardial effusion.   CKD (chronic kidney disease)    Diabetes mellitus    diet controlled   Hypertension     Past Surgical History:  Procedure Laterality Date   CESAREAN SECTION  x4   CHOLECYSTECTOMY     EYE SURGERY     MASS EXCISION  10/27/2011   Procedure: EXCISION MASS;  Surgeon: Fabio Bering, MD;  Location: [facility];  Service: General;  Laterality: Right;   TUBAL LIGATION      Allergies  Allergen Reactions   Ranitidine Hcl Rash    Prior to Admission medications   Medication Sig Start Date End Date Taking? Authorizing Provider  amLODipine (NORVASC) 10 MG tablet TAKE 1 TABLET BY MOUTH DAILY Patient taking differently: Take 10 mg by mouth daily. 12/25/21  Yes Erick Blinks, MD  calcitRIOL (ROCALTROL) 0.25 MCG capsule Take 0.25 mcg by mouth daily. 03/03/22 03/03/23 Yes [provider]  carvedilol (COREG) 25 MG tablet Take 1 tablet (25 mg total) by mouth 2 (two) times daily. 08/23/22 08/18/23 Yes Strader, Lennart Pall, PA-C  furosemide (LASIX) 40 MG tablet Take 80 mg by mouth 2 (two) times daily.   Yes [provider]  hydrALAZINE (APRESOLINE) 100 MG tablet Take 1 tablet (100 mg total) by mouth 3 (three) times daily. 08/23/22 04/20/23 Yes Strader,  Lennart Pall, PA-C  levothyroxine (SYNTHROID) 50 MCG tablet TAKE 1 TABLET(50 MCG) BY MOUTH EVERY DAY 08/22/22  Yes Cook, Jayce G, DO  rosuvastatin (CRESTOR) 40 MG tablet Take 1 tablet (40 mg total) by mouth daily with supper. 12/25/21 12/20/22 Yes Erick Blinks, MD  sodium bicarbonate 650 MG tablet Take 650 mg by mouth 4 (four) times daily.   Yes [provider]  insulin glargine (LANTUS) 100 UNIT/ML Solostar Pen Inject 10 Units into the skin daily. Patient not taking: Reported on 08/29/2022 08/24/22   [name], DO  Insulin Pen Needle 31G X 4 MM MISC Use to administer insulin daily. 08/24/22   [name], DO  permethrin (ELIMITE) 5 % cream Apply to affected area once Patient not taking: Reported on 08/29/2022 08/22/22   Cathlean Marseilles A, NP  triamcinolone (KENALOG) 0.025 % ointment Apply 1 Application topically 2 (two) times daily. Apply sparingly twice daily to itchy areas; not for use on the face Patient not taking: Reported on 08/29/2022 08/21/22   Valentino Nose, NP    Social History   Socioeconomic History   Marital status: Single    Spouse name: Not on file   Number of children: Not on file   Years of education: Not on file   Highest education level: Not on file  Occupational History   Not on file  Tobacco Use   Smoking status: Never  Smokeless tobacco: Never  Vaping Use   Vaping Use: Never used  Substance and Sexual Activity   Alcohol use: Not Currently    Comment: occ   Drug use: No   Sexual activity: Yes    Birth control/protection: Surgical  Other Topics Concern   Not on file  Social History Narrative   Divorced,married for 8 years.Lives with daughter and 3 grandkids.Unemployed.   Social Determinants of Health   Financial Resource Strain: Not on file  Food Insecurity: No Food Insecurity (12/22/2021)   Hunger Vital Sign    Worried About Running Out of Food in the Last Year: Never true    Ran Out of Food in the Last Year: Never true  Transportation  Needs: No Transportation Needs (12/22/2021)   PRAPARE - Administrator, Civil Service (Medical): No    Lack of Transportation (Non-Medical): No  Physical Activity: Not on file  Stress: Not on file  Social Connections: Not on file  Intimate Partner Violence: Not At Risk (12/22/2021)   Humiliation, Afraid, Rape, and Kick questionnaire    Fear of Current or Ex-Partner: No    Emotionally Abused: No    Physically Abused: No    Sexually Abused: No    Family History  Problem Relation Age of Onset   Kidney failure Mother    Diabetes Mother    Kidney disease Mother    Kidney failure Father    Kidney disease Father    Kidney failure Brother    Diabetes Brother    Diabetes Brother     ROS: Otherwise negative unless mentioned in HPI  Physical Examination  Vitals:   09/05/22 0557  BP: (!) 162/81  Pulse: 75  Resp: 20  Temp: 98.1 F (36.7 C)  SpO2: 98%   Body mass index is 36.72 kg/m.  General:  WDWN in NAD Gait: Not observed HENT: blind  Pulmonary: normal non-labored breathing, without Rales, rhonchi,  wheezing Cardiac: regular Abdomen: soft, NT/ND, no masses Skin: without rashes Vascular Exam/Pulses: 2+ radials Extremities: without ischemic changes, without Gangrene , without cellulitis; without open wounds;  Musculoskeletal: no muscle wasting or atrophy  Neurologic: A&O X 3;  No focal weakness or paresthesias are detected; speech is fluent/normal Psychiatric:  The pt has Normal affect. Lymph:  Unremarkable  CBC    Component Value Date/Time   WBC 7.9 08/23/2022 1226   RBC 3.54 (L) 08/23/2022 1226   HGB 10.9 (L) 09/05/2022 0605   HCT 32.0 (L) 09/05/2022 0605   PLT 290 08/23/2022 1226   MCV 88.7 08/23/2022 1226   MCH 29.1 08/23/2022 1226   MCHC 32.8 08/23/2022 1226   RDW 12.8 08/23/2022 1226   LYMPHSABS 1.0 08/23/2022 1226   MONOABS 0.6 08/23/2022 1226   EOSABS 0.3 08/23/2022 1226   BASOSABS 0.0 08/23/2022 1226    BMET    Component Value  Date/Time   NA 135 09/05/2022 0605   K 4.3 09/05/2022 0605   CL 98 09/05/2022 0605   CO2 25 08/23/2022 1226   GLUCOSE 378 (H) 09/05/2022 0605   BUN 79 (H) 09/05/2022 0605   BUN 11 05/05/2017 0000   CREATININE 6.80 (H) 09/05/2022 0605   CREATININE 2.16 (H) 10/01/2020 1139   CALCIUM 9.2 08/23/2022 1226   GFRNONAA 7 (L) 08/23/2022 1226   GFRNONAA 27 (L) 09/02/2020 0000   GFRAA 31 (L) 09/02/2020 0000    COAGS: Lab Results  Component Value Date   INR 1.0 12/22/2021   INR 1.0 11/14/2021  INR 0.9 11/13/2021     ASSESSMENT/PLAN: This is a 52 y.o. female with end-stage renal disease now requiring short-term and long-term HD access.  Plan will be for tunneled dialysis catheter, left-sided AV graft.  I will also look at her superficial veins to see if there have been any changes in the interim.  After discussing the risk and benefits the above, Ana [name] elected to proceed.    Fara Olden MD MS [facility] [phone] 09/05/2022  7:21 AM\

## 2022-09-05 NOTE — Discharge Instructions (Signed)
[facility]  Discharge Instructions  AV Fistula or Graft Surgery for Dialysis Access  Please refer to the following instructions for your post-procedure care. Your surgeon or physician assistant will discuss any changes with you.  Activity  You may drive the day following your surgery, if you are comfortable and no longer taking prescription pain medication. Resume full activity as the soreness in your incision resolves.  Bathing/Showering  You may shower after you go home. Keep your incision dry for 48 hours. Do not soak in a bathtub, hot tub, or swim until the incision heals completely. You may not shower if you have a hemodialysis catheter.  Incision Care  Clean your incision with mild soap and water after 48 hours. Pat the area dry with a clean towel. You do not need a bandage unless otherwise instructed. Do not apply any ointments or creams to your incision. You may have skin glue on your incision. Do not peel it off. It will come off on its own in about one week. Your arm may swell a bit after surgery. To reduce swelling use pillows to elevate your arm so it is above your heart. Your doctor will tell you if you need to lightly wrap your arm with an ACE bandage.  Diet  Resume your normal diet. There are not special food restrictions following this procedure. In order to heal from your surgery, it is CRITICAL to get adequate nutrition. Your body requires vitamins, minerals, and protein. Vegetables are the best source of vitamins and minerals. Vegetables also provide the perfect balance of protein. Processed food has little nutritional value, so try to avoid this.  Medications  Resume taking all of your medications. If your incision is causing pain, you may take over-the counter pain relievers such as acetaminophen (Tylenol). If you were prescribed a stronger pain medication, please be aware these medications can cause nausea and constipation. Prevent  nausea by taking the medication with a snack or meal. Avoid constipation by drinking plenty of fluids and eating foods with high amount of fiber, such as fruits, vegetables, and grains.  Do not take Tylenol if you are taking prescription pain medications.  Follow up Your surgeon may want to see you in the office following your access surgery. If so, this will be arranged at the time of your surgery.  Please call us immediately for any of the following conditions:  Increased pain, redness, drainage (pus) from your incision site Fever of 101 degrees or higher Severe or worsening pain at your incision site Hand pain or numbness.  Reduce your risk of vascular disease:  Stop smoking. If you would like help, call [facility] at 1-800-QUIT-NOW (585-204-4258) or [place] at [phone]  Manage your cholesterol Maintain a desired weight Control your diabetes Keep your blood pressure down  Dialysis  It will take several weeks to several months for your new dialysis access to be ready for use. Your surgeon will determine when it is okay to use it. Your nephrologist will continue to direct your dialysis. You can continue to use your Permcath until your new access is ready for use.   09/05/2022 Ana [name] [id number] [birth year]  Surgeon(s): Victorino Sparrow, MD  Procedure(s): LEFT BRACHIO BASILIC ARTERIOVENOUS (AV) FISTULA CREATION INSERTION OF TUNNELED DIALYSIS CATHETER RIGHT INTERNAL JUIGULAR VEIN   May stick graft immediately   May stick graft on designated area only:   X Do not stick left AV fistula for 12 weeks  If you have any questions, please call the office at [phone].

## 2022-09-06 ENCOUNTER — Encounter (HOSPITAL_COMMUNITY)
Admission: RE | Admit: 2022-09-06 | Discharge: 2022-09-06 | Disposition: A | Discharge status: Home / Self Care | Payer: Medicaid Other | Source: Ambulatory Visit | Attending: Nephrology | Admitting: Nephrology

## 2022-09-06 ENCOUNTER — Encounter (HOSPITAL_COMMUNITY): Payer: Self-pay | Admitting: Vascular Surgery

## 2022-09-06 DIAGNOSIS — D631 Anemia in chronic kidney disease: Secondary | ICD-10-CM | POA: Insufficient documentation

## 2022-09-06 DIAGNOSIS — E1165 Type 2 diabetes mellitus with hyperglycemia: Secondary | ICD-10-CM | POA: Diagnosis not present

## 2022-09-06 DIAGNOSIS — N185 Chronic kidney disease, stage 5: Secondary | ICD-10-CM | POA: Insufficient documentation

## 2022-09-07 DIAGNOSIS — Z992 Dependence on renal dialysis: Secondary | ICD-10-CM | POA: Diagnosis not present

## 2022-09-07 DIAGNOSIS — D689 Coagulation defect, unspecified: Secondary | ICD-10-CM | POA: Diagnosis not present

## 2022-09-07 DIAGNOSIS — R52 Pain, unspecified: Secondary | ICD-10-CM | POA: Diagnosis not present

## 2022-09-07 DIAGNOSIS — N2581 Secondary hyperparathyroidism of renal origin: Secondary | ICD-10-CM | POA: Diagnosis not present

## 2022-09-07 DIAGNOSIS — E1165 Type 2 diabetes mellitus with hyperglycemia: Secondary | ICD-10-CM | POA: Diagnosis not present

## 2022-09-07 DIAGNOSIS — N186 End stage renal disease: Secondary | ICD-10-CM | POA: Diagnosis not present

## 2022-09-08 ENCOUNTER — Telehealth: Payer: Self-pay | Admitting: Physician Assistant

## 2022-09-08 DIAGNOSIS — E1165 Type 2 diabetes mellitus with hyperglycemia: Secondary | ICD-10-CM | POA: Diagnosis not present

## 2022-09-08 NOTE — Telephone Encounter (Signed)
Appt has been scheduled.

## 2022-09-08 NOTE — Telephone Encounter (Signed)
-----   Message from Graceann Congress sent at 09/05/2022  9:18 AM EDT ----- S/p left brachiobasilic AV fistula by Dr. Karin Lieu. She needs follow up in 4-6 weeks with fistula duplex. thanks

## 2022-09-09 DIAGNOSIS — D689 Coagulation defect, unspecified: Secondary | ICD-10-CM | POA: Diagnosis not present

## 2022-09-09 DIAGNOSIS — E1165 Type 2 diabetes mellitus with hyperglycemia: Secondary | ICD-10-CM | POA: Diagnosis not present

## 2022-09-09 DIAGNOSIS — R52 Pain, unspecified: Secondary | ICD-10-CM | POA: Diagnosis not present

## 2022-09-09 DIAGNOSIS — Z992 Dependence on renal dialysis: Secondary | ICD-10-CM | POA: Diagnosis not present

## 2022-09-09 DIAGNOSIS — N186 End stage renal disease: Secondary | ICD-10-CM | POA: Diagnosis not present

## 2022-09-09 DIAGNOSIS — N2581 Secondary hyperparathyroidism of renal origin: Secondary | ICD-10-CM | POA: Diagnosis not present

## 2022-09-10 DIAGNOSIS — E1165 Type 2 diabetes mellitus with hyperglycemia: Secondary | ICD-10-CM | POA: Diagnosis not present

## 2022-09-11 DIAGNOSIS — E1165 Type 2 diabetes mellitus with hyperglycemia: Secondary | ICD-10-CM | POA: Diagnosis not present

## 2022-09-12 DIAGNOSIS — N2581 Secondary hyperparathyroidism of renal origin: Secondary | ICD-10-CM | POA: Diagnosis not present

## 2022-09-12 DIAGNOSIS — Z992 Dependence on renal dialysis: Secondary | ICD-10-CM | POA: Diagnosis not present

## 2022-09-12 DIAGNOSIS — N186 End stage renal disease: Secondary | ICD-10-CM | POA: Diagnosis not present

## 2022-09-12 DIAGNOSIS — E1165 Type 2 diabetes mellitus with hyperglycemia: Secondary | ICD-10-CM | POA: Diagnosis not present

## 2022-09-12 DIAGNOSIS — D631 Anemia in chronic kidney disease: Secondary | ICD-10-CM | POA: Diagnosis not present

## 2022-09-12 DIAGNOSIS — D689 Coagulation defect, unspecified: Secondary | ICD-10-CM | POA: Diagnosis not present

## 2022-09-12 DIAGNOSIS — Z23 Encounter for immunization: Secondary | ICD-10-CM | POA: Diagnosis not present

## 2022-09-13 DIAGNOSIS — E1165 Type 2 diabetes mellitus with hyperglycemia: Secondary | ICD-10-CM | POA: Diagnosis not present

## 2022-09-14 DIAGNOSIS — Z23 Encounter for immunization: Secondary | ICD-10-CM | POA: Diagnosis not present

## 2022-09-14 DIAGNOSIS — Z992 Dependence on renal dialysis: Secondary | ICD-10-CM | POA: Diagnosis not present

## 2022-09-14 DIAGNOSIS — E1165 Type 2 diabetes mellitus with hyperglycemia: Secondary | ICD-10-CM | POA: Diagnosis not present

## 2022-09-14 DIAGNOSIS — N186 End stage renal disease: Secondary | ICD-10-CM | POA: Diagnosis not present

## 2022-09-14 DIAGNOSIS — N2581 Secondary hyperparathyroidism of renal origin: Secondary | ICD-10-CM | POA: Diagnosis not present

## 2022-09-14 DIAGNOSIS — D631 Anemia in chronic kidney disease: Secondary | ICD-10-CM | POA: Diagnosis not present

## 2022-09-14 DIAGNOSIS — D689 Coagulation defect, unspecified: Secondary | ICD-10-CM | POA: Diagnosis not present

## 2022-09-15 DIAGNOSIS — E1165 Type 2 diabetes mellitus with hyperglycemia: Secondary | ICD-10-CM | POA: Diagnosis not present

## 2022-09-16 DIAGNOSIS — Z992 Dependence on renal dialysis: Secondary | ICD-10-CM | POA: Diagnosis not present

## 2022-09-16 DIAGNOSIS — D689 Coagulation defect, unspecified: Secondary | ICD-10-CM | POA: Diagnosis not present

## 2022-09-16 DIAGNOSIS — Z23 Encounter for immunization: Secondary | ICD-10-CM | POA: Diagnosis not present

## 2022-09-16 DIAGNOSIS — E1165 Type 2 diabetes mellitus with hyperglycemia: Secondary | ICD-10-CM | POA: Diagnosis not present

## 2022-09-16 DIAGNOSIS — N186 End stage renal disease: Secondary | ICD-10-CM | POA: Diagnosis not present

## 2022-09-16 DIAGNOSIS — N2581 Secondary hyperparathyroidism of renal origin: Secondary | ICD-10-CM | POA: Diagnosis not present

## 2022-09-16 DIAGNOSIS — D631 Anemia in chronic kidney disease: Secondary | ICD-10-CM | POA: Diagnosis not present

## 2022-09-17 DIAGNOSIS — E1165 Type 2 diabetes mellitus with hyperglycemia: Secondary | ICD-10-CM | POA: Diagnosis not present

## 2022-09-18 DIAGNOSIS — E1165 Type 2 diabetes mellitus with hyperglycemia: Secondary | ICD-10-CM | POA: Diagnosis not present

## 2022-09-19 ENCOUNTER — Ambulatory Visit: Payer: Medicaid Other | Admitting: Family Medicine

## 2022-09-19 DIAGNOSIS — E1165 Type 2 diabetes mellitus with hyperglycemia: Secondary | ICD-10-CM | POA: Diagnosis not present

## 2022-09-19 DIAGNOSIS — D689 Coagulation defect, unspecified: Secondary | ICD-10-CM | POA: Diagnosis not present

## 2022-09-19 DIAGNOSIS — N186 End stage renal disease: Secondary | ICD-10-CM | POA: Diagnosis not present

## 2022-09-19 DIAGNOSIS — N2581 Secondary hyperparathyroidism of renal origin: Secondary | ICD-10-CM | POA: Diagnosis not present

## 2022-09-19 DIAGNOSIS — Z992 Dependence on renal dialysis: Secondary | ICD-10-CM | POA: Diagnosis not present

## 2022-09-20 ENCOUNTER — Encounter (HOSPITAL_COMMUNITY): Payer: Medicaid Other

## 2022-09-20 ENCOUNTER — Ambulatory Visit: Payer: Self-pay | Admitting: Emergency Medicine

## 2022-09-20 DIAGNOSIS — E1165 Type 2 diabetes mellitus with hyperglycemia: Secondary | ICD-10-CM | POA: Diagnosis not present

## 2022-09-21 ENCOUNTER — Other Ambulatory Visit: Payer: Self-pay | Admitting: *Deleted

## 2022-09-21 DIAGNOSIS — N184 Chronic kidney disease, stage 4 (severe): Secondary | ICD-10-CM

## 2022-09-21 DIAGNOSIS — D689 Coagulation defect, unspecified: Secondary | ICD-10-CM | POA: Diagnosis not present

## 2022-09-21 DIAGNOSIS — E1165 Type 2 diabetes mellitus with hyperglycemia: Secondary | ICD-10-CM | POA: Diagnosis not present

## 2022-09-21 DIAGNOSIS — N186 End stage renal disease: Secondary | ICD-10-CM | POA: Diagnosis not present

## 2022-09-21 DIAGNOSIS — Z992 Dependence on renal dialysis: Secondary | ICD-10-CM | POA: Diagnosis not present

## 2022-09-21 DIAGNOSIS — N2581 Secondary hyperparathyroidism of renal origin: Secondary | ICD-10-CM | POA: Diagnosis not present

## 2022-09-22 ENCOUNTER — Ambulatory Visit: Payer: Medicaid Other | Admitting: Family Medicine

## 2022-09-22 VITALS — BP 138/72 | HR 73 | Temp 97.7°F | Ht 60.0 in | Wt 187.4 lb

## 2022-09-22 DIAGNOSIS — E782 Mixed hyperlipidemia: Secondary | ICD-10-CM | POA: Diagnosis not present

## 2022-09-22 DIAGNOSIS — E785 Hyperlipidemia, unspecified: Secondary | ICD-10-CM | POA: Diagnosis not present

## 2022-09-22 DIAGNOSIS — N186 End stage renal disease: Secondary | ICD-10-CM

## 2022-09-22 DIAGNOSIS — I10 Essential (primary) hypertension: Secondary | ICD-10-CM

## 2022-09-22 DIAGNOSIS — E1165 Type 2 diabetes mellitus with hyperglycemia: Secondary | ICD-10-CM | POA: Diagnosis not present

## 2022-09-22 DIAGNOSIS — E1169 Type 2 diabetes mellitus with other specified complication: Secondary | ICD-10-CM | POA: Diagnosis not present

## 2022-09-22 MED ORDER — INSULIN PEN NEEDLE 31G X 4 MM MISC: 3 refills | Status: AC

## 2022-09-22 MED ORDER — INSULIN GLARGINE 100 UNIT/ML SOLOSTAR PEN: 10.0000 [IU] | PEN_INJECTOR | Freq: Every day | SUBCUTANEOUS | 1 refills | Status: DC

## 2022-09-22 NOTE — Patient Instructions (Addendum)
Start insulin.   I am reaching out to the Eye doctor again.  Follow up in 1 month.

## 2022-09-23 DIAGNOSIS — E1165 Type 2 diabetes mellitus with hyperglycemia: Secondary | ICD-10-CM | POA: Diagnosis not present

## 2022-09-23 DIAGNOSIS — N186 End stage renal disease: Secondary | ICD-10-CM | POA: Diagnosis not present

## 2022-09-23 DIAGNOSIS — Z992 Dependence on renal dialysis: Secondary | ICD-10-CM | POA: Diagnosis not present

## 2022-09-23 DIAGNOSIS — N2581 Secondary hyperparathyroidism of renal origin: Secondary | ICD-10-CM | POA: Diagnosis not present

## 2022-09-23 DIAGNOSIS — D689 Coagulation defect, unspecified: Secondary | ICD-10-CM | POA: Diagnosis not present

## 2022-09-24 DIAGNOSIS — E1165 Type 2 diabetes mellitus with hyperglycemia: Secondary | ICD-10-CM | POA: Diagnosis not present

## 2022-09-25 DIAGNOSIS — E1165 Type 2 diabetes mellitus with hyperglycemia: Secondary | ICD-10-CM | POA: Diagnosis not present

## 2022-09-25 DIAGNOSIS — N186 End stage renal disease: Secondary | ICD-10-CM | POA: Insufficient documentation

## 2022-09-25 NOTE — Assessment & Plan Note (Signed)
BP well controlled.  Continue current medications.

## 2022-09-25 NOTE — Assessment & Plan Note (Signed)
Uncontrolled.  Insulin sent in again.  We discussed this today.  Daughter will help with administering insulin due to patient's visual impairment.

## 2022-09-25 NOTE — Progress Notes (Signed)
Subjective:  Patient ID: Ana [name], female    DOB: 08/14/1970  Age: 52 y.o. MRN: [id number]  CC: Chief Complaint  Patient presents with   Diabetes   Hyperlipidemia    HPI:  52 year old female with hypertension, HFpEF, type 2 diabetes, hypothyroidism, end-stage renal disease, hyperlipidemia presents for follow-up.  Patient is now on hemodialysis.  She states that overall she is doing okay.  BP well-controlled on amlodipine, carvedilol, hydralazine, and Lasix.  Patient has not started insulin which was recommended regarding uncontrolled type 2 diabetes.  Will discuss this today.  Has not yet seen ophthalmology. Referral has been placed.  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) 12/21/2021   Hypothyroidism, adult 09/02/2020   Vitamin D deficiency disease 09/02/2020   Type 2 diabetes mellitus with hyperlipidemia (HCC) 09/26/2017   Essential hypertension, benign 09/26/2017   Mixed hyperlipidemia 09/26/2017    Social Hx   Social History   Socioeconomic History   Marital status: Single    Spouse name: Not on file   Number of children: Not on file   Years of education: Not on file   Highest education level: Not on file  Occupational History   Not on file  Tobacco Use   Smoking status: Never   Smokeless tobacco: Never  Vaping Use   Vaping status: Never Used  Substance and Sexual Activity   Alcohol use: Not Currently    Comment: occ   Drug use: No   Sexual activity: Yes    Birth control/protection: Surgical  Other Topics Concern   Not on file  Social History Narrative   Divorced,married for 8 years.Lives with daughter and 3 grandkids.Unemployed.   Social Determinants of Health   Financial Resource Strain: Not on  file  Food Insecurity: No Food Insecurity (12/22/2021)   Hunger Vital Sign    Worried About Running Out of Food in the Last Year: Never true    Ran Out of Food in the Last Year: Never true  Transportation Needs: No Transportation Needs (12/22/2021)   PRAPARE - Administrator, Civil Service (Medical): No    Lack of Transportation (Non-Medical): No  Physical Activity: Not on file  Stress: Not on file  Social Connections: Not on file    Review of Systems  Constitutional: Negative.   Respiratory: Negative.    Cardiovascular: Negative.    Objective:  BP 138/72   Pulse 73   Temp 97.7 F (36.5 C)   Ht 5' (1.524 m)   Wt 187 lb 6.4 oz (85 kg)   LMP 09/22/2019 (Approximate)   SpO2 96%   BMI 36.60 kg/m      09/22/2022   11:40 AM 09/05/2022   10:45 AM 09/05/2022   10:30 AM  BP/Weight  Systolic BP 138 159 155  Diastolic BP 72 76 78  Wt. (Lbs) 187.4    BMI 36.6 kg/m2      Physical Exam Vitals and nursing note reviewed.  Constitutional:      General: She is not in acute distress. HENT:     Head: Normocephalic and atraumatic.  Cardiovascular:     Rate and Rhythm: Normal rate and regular rhythm.  Pulmonary:     Effort: Pulmonary effort is normal.  Breath sounds: Normal breath sounds. No wheezing or rales.  Neurological:     Mental Status: She is alert.  Psychiatric:        Mood and Affect: Mood normal.        Behavior: Behavior normal.     Lab Results  Component Value Date   WBC 7.9 08/23/2022   HGB 10.9 (L) 09/05/2022   HCT 32.0 (L) 09/05/2022   PLT 290 08/23/2022   GLUCOSE 378 (H) 09/05/2022   CHOL 133 08/04/2022   TRIG 119 08/04/2022   HDL 42 08/04/2022   LDLCALC 70 08/04/2022   ALT 34 03/18/2022   AST 36 03/18/2022   NA 135 09/05/2022   K 4.3 09/05/2022   CL 98 09/05/2022   CREATININE 6.80 (H) 09/05/2022   BUN 79 (H) 09/05/2022   CO2 25 08/23/2022   TSH 2.013 03/18/2022   INR 1.0 12/22/2021   HGBA1C 10.9 (H) 08/04/2022     Assessment  & Plan:   Problem List Items Addressed This Visit       Cardiovascular and Mediastinum   Essential hypertension, benign - Primary    BP well-controlled.  Continue current medications.        Endocrine   Type 2 diabetes mellitus with hyperlipidemia (HCC)    Uncontrolled.  Insulin sent in again.  We discussed this today.  Daughter will help with administering insulin due to patient's visual impairment.      Relevant Medications   insulin glargine (LANTUS) 100 UNIT/ML Solostar Pen     Genitourinary   ESRD (end stage renal disease) (HCC)     Other   Mixed hyperlipidemia    Last LDL 70.  Continue Crestor.       Meds ordered this encounter  Medications   insulin glargine (LANTUS) 100 UNIT/ML Solostar Pen    Sig: Inject 10 Units into the skin daily.    Dispense:  15 mL    Refill:  1   Insulin Pen Needle 31G X 4 MM MISC    Sig: Use to administer insulin daily.    Dispense:  100 each    Refill:  3    Follow-up:  Return in about 1 month (around 10/23/2022) for Diabetes follow up.  Ana [name] Other DO [facility]

## 2022-09-25 NOTE — Assessment & Plan Note (Signed)
Last LDL 70.  Continue Crestor.

## 2022-09-26 DIAGNOSIS — N186 End stage renal disease: Secondary | ICD-10-CM | POA: Diagnosis not present

## 2022-09-26 DIAGNOSIS — Z992 Dependence on renal dialysis: Secondary | ICD-10-CM | POA: Diagnosis not present

## 2022-09-26 DIAGNOSIS — N2581 Secondary hyperparathyroidism of renal origin: Secondary | ICD-10-CM | POA: Diagnosis not present

## 2022-09-26 DIAGNOSIS — D689 Coagulation defect, unspecified: Secondary | ICD-10-CM | POA: Diagnosis not present

## 2022-09-26 DIAGNOSIS — E1165 Type 2 diabetes mellitus with hyperglycemia: Secondary | ICD-10-CM | POA: Diagnosis not present

## 2022-09-27 DIAGNOSIS — E1165 Type 2 diabetes mellitus with hyperglycemia: Secondary | ICD-10-CM | POA: Diagnosis not present

## 2022-09-28 DIAGNOSIS — Z992 Dependence on renal dialysis: Secondary | ICD-10-CM | POA: Diagnosis not present

## 2022-09-28 DIAGNOSIS — D689 Coagulation defect, unspecified: Secondary | ICD-10-CM | POA: Diagnosis not present

## 2022-09-28 DIAGNOSIS — E1165 Type 2 diabetes mellitus with hyperglycemia: Secondary | ICD-10-CM | POA: Diagnosis not present

## 2022-09-28 DIAGNOSIS — N186 End stage renal disease: Secondary | ICD-10-CM | POA: Diagnosis not present

## 2022-09-28 DIAGNOSIS — N2581 Secondary hyperparathyroidism of renal origin: Secondary | ICD-10-CM | POA: Diagnosis not present

## 2022-09-29 DIAGNOSIS — E1165 Type 2 diabetes mellitus with hyperglycemia: Secondary | ICD-10-CM | POA: Diagnosis not present

## 2022-09-30 DIAGNOSIS — Z992 Dependence on renal dialysis: Secondary | ICD-10-CM | POA: Diagnosis not present

## 2022-09-30 DIAGNOSIS — E1165 Type 2 diabetes mellitus with hyperglycemia: Secondary | ICD-10-CM | POA: Diagnosis not present

## 2022-09-30 DIAGNOSIS — N186 End stage renal disease: Secondary | ICD-10-CM | POA: Diagnosis not present

## 2022-09-30 DIAGNOSIS — D689 Coagulation defect, unspecified: Secondary | ICD-10-CM | POA: Diagnosis not present

## 2022-09-30 DIAGNOSIS — N2581 Secondary hyperparathyroidism of renal origin: Secondary | ICD-10-CM | POA: Diagnosis not present

## 2022-10-01 DIAGNOSIS — E1165 Type 2 diabetes mellitus with hyperglycemia: Secondary | ICD-10-CM | POA: Diagnosis not present

## 2022-10-02 DIAGNOSIS — E1165 Type 2 diabetes mellitus with hyperglycemia: Secondary | ICD-10-CM | POA: Diagnosis not present

## 2022-10-03 DIAGNOSIS — N186 End stage renal disease: Secondary | ICD-10-CM | POA: Diagnosis not present

## 2022-10-03 DIAGNOSIS — D631 Anemia in chronic kidney disease: Secondary | ICD-10-CM | POA: Diagnosis not present

## 2022-10-03 DIAGNOSIS — E1165 Type 2 diabetes mellitus with hyperglycemia: Secondary | ICD-10-CM | POA: Diagnosis not present

## 2022-10-03 DIAGNOSIS — D689 Coagulation defect, unspecified: Secondary | ICD-10-CM | POA: Diagnosis not present

## 2022-10-03 DIAGNOSIS — Z992 Dependence on renal dialysis: Secondary | ICD-10-CM | POA: Diagnosis not present

## 2022-10-03 DIAGNOSIS — N2581 Secondary hyperparathyroidism of renal origin: Secondary | ICD-10-CM | POA: Diagnosis not present

## 2022-10-03 DIAGNOSIS — R52 Pain, unspecified: Secondary | ICD-10-CM | POA: Diagnosis not present

## 2022-10-04 DIAGNOSIS — E1165 Type 2 diabetes mellitus with hyperglycemia: Secondary | ICD-10-CM | POA: Diagnosis not present

## 2022-10-05 DIAGNOSIS — D689 Coagulation defect, unspecified: Secondary | ICD-10-CM | POA: Diagnosis not present

## 2022-10-05 DIAGNOSIS — Z992 Dependence on renal dialysis: Secondary | ICD-10-CM | POA: Diagnosis not present

## 2022-10-05 DIAGNOSIS — N186 End stage renal disease: Secondary | ICD-10-CM | POA: Diagnosis not present

## 2022-10-05 DIAGNOSIS — E1165 Type 2 diabetes mellitus with hyperglycemia: Secondary | ICD-10-CM | POA: Diagnosis not present

## 2022-10-05 DIAGNOSIS — N2581 Secondary hyperparathyroidism of renal origin: Secondary | ICD-10-CM | POA: Diagnosis not present

## 2022-10-05 DIAGNOSIS — R52 Pain, unspecified: Secondary | ICD-10-CM | POA: Diagnosis not present

## 2022-10-05 DIAGNOSIS — D631 Anemia in chronic kidney disease: Secondary | ICD-10-CM | POA: Diagnosis not present

## 2022-10-06 DIAGNOSIS — E1165 Type 2 diabetes mellitus with hyperglycemia: Secondary | ICD-10-CM | POA: Diagnosis not present

## 2022-10-07 DIAGNOSIS — N2581 Secondary hyperparathyroidism of renal origin: Secondary | ICD-10-CM | POA: Diagnosis not present

## 2022-10-07 DIAGNOSIS — Z992 Dependence on renal dialysis: Secondary | ICD-10-CM | POA: Diagnosis not present

## 2022-10-07 DIAGNOSIS — D689 Coagulation defect, unspecified: Secondary | ICD-10-CM | POA: Diagnosis not present

## 2022-10-07 DIAGNOSIS — E1165 Type 2 diabetes mellitus with hyperglycemia: Secondary | ICD-10-CM | POA: Diagnosis not present

## 2022-10-07 DIAGNOSIS — D631 Anemia in chronic kidney disease: Secondary | ICD-10-CM | POA: Diagnosis not present

## 2022-10-07 DIAGNOSIS — N186 End stage renal disease: Secondary | ICD-10-CM | POA: Diagnosis not present

## 2022-10-07 DIAGNOSIS — R52 Pain, unspecified: Secondary | ICD-10-CM | POA: Diagnosis not present

## 2022-10-08 DIAGNOSIS — E1165 Type 2 diabetes mellitus with hyperglycemia: Secondary | ICD-10-CM | POA: Diagnosis not present

## 2022-10-09 DIAGNOSIS — E1165 Type 2 diabetes mellitus with hyperglycemia: Secondary | ICD-10-CM | POA: Diagnosis not present

## 2022-10-10 DIAGNOSIS — Z992 Dependence on renal dialysis: Secondary | ICD-10-CM | POA: Diagnosis not present

## 2022-10-10 DIAGNOSIS — D689 Coagulation defect, unspecified: Secondary | ICD-10-CM | POA: Diagnosis not present

## 2022-10-10 DIAGNOSIS — N186 End stage renal disease: Secondary | ICD-10-CM | POA: Diagnosis not present

## 2022-10-10 DIAGNOSIS — E1165 Type 2 diabetes mellitus with hyperglycemia: Secondary | ICD-10-CM | POA: Diagnosis not present

## 2022-10-10 DIAGNOSIS — N2581 Secondary hyperparathyroidism of renal origin: Secondary | ICD-10-CM | POA: Diagnosis not present

## 2022-10-11 DIAGNOSIS — E1165 Type 2 diabetes mellitus with hyperglycemia: Secondary | ICD-10-CM | POA: Diagnosis not present

## 2022-10-12 DIAGNOSIS — Z992 Dependence on renal dialysis: Secondary | ICD-10-CM | POA: Diagnosis not present

## 2022-10-12 DIAGNOSIS — E1165 Type 2 diabetes mellitus with hyperglycemia: Secondary | ICD-10-CM | POA: Diagnosis not present

## 2022-10-12 DIAGNOSIS — N186 End stage renal disease: Secondary | ICD-10-CM | POA: Diagnosis not present

## 2022-10-12 DIAGNOSIS — N2581 Secondary hyperparathyroidism of renal origin: Secondary | ICD-10-CM | POA: Diagnosis not present

## 2022-10-12 DIAGNOSIS — D689 Coagulation defect, unspecified: Secondary | ICD-10-CM | POA: Diagnosis not present

## 2022-10-13 ENCOUNTER — Ambulatory Visit (HOSPITAL_COMMUNITY)
Admission: RE | Admit: 2022-10-13 | Discharge: 2022-10-13 | Disposition: A | Discharge status: Home / Self Care | Payer: Medicaid Other | Source: Ambulatory Visit | Attending: Vascular Surgery | Admitting: Vascular Surgery

## 2022-10-13 ENCOUNTER — Ambulatory Visit (INDEPENDENT_AMBULATORY_CARE_PROVIDER_SITE_OTHER): Payer: Medicaid Other | Admitting: Physician Assistant

## 2022-10-13 VITALS — BP 117/70 | HR 73 | Temp 97.6°F | Resp 20 | Ht 60.0 in | Wt 188.0 lb

## 2022-10-13 DIAGNOSIS — N184 Chronic kidney disease, stage 4 (severe): Secondary | ICD-10-CM | POA: Diagnosis not present

## 2022-10-13 DIAGNOSIS — N186 End stage renal disease: Secondary | ICD-10-CM

## 2022-10-13 DIAGNOSIS — E1165 Type 2 diabetes mellitus with hyperglycemia: Secondary | ICD-10-CM | POA: Diagnosis not present

## 2022-10-13 DIAGNOSIS — Z992 Dependence on renal dialysis: Secondary | ICD-10-CM

## 2022-10-13 NOTE — Progress Notes (Signed)
POST OPERATIVE OFFICE NOTE    CC:  F/u for surgery  HPI:  Ana [name] is a 52 y.o. female who is s/p right IJ [facility] placement and left brachiobasilic fistula creation on 09/05/2022 by Dr. Karin Lieu.  This was done for permanent dialysis access placement.  Pt returns today for follow up.  Pt states she has felt fine since surgery.  She still has some soreness around the catheter site, especially during dialysis but this is improving.  Her left arm incision was sore for about a week after surgery but this is also improved.  She denies any issues such as drainage from the incision, erythema, or swelling.  She denies any symptoms of steal such as excessive coldness, left hand pain, or weakness.  She dialyzes via right IJ TDC on Tuesdays, Thursdays, and Saturdays.   Allergies  Allergen Reactions   Ranitidine Hcl Rash    Current Outpatient Medications  Medication Sig Dispense Refill   amLODipine (NORVASC) 10 MG tablet TAKE 1 TABLET BY MOUTH DAILY (Patient taking differently: Take 10 mg by mouth daily.) 90 tablet 3   calcitRIOL (ROCALTROL) 0.25 MCG capsule Take 0.25 mcg by mouth daily.     carvedilol (COREG) 25 MG tablet Take 1 tablet (25 mg total) by mouth 2 (two) times daily. 180 tablet 3   furosemide (LASIX) 40 MG tablet Take 80 mg by mouth 2 (two) times daily.     hydrALAZINE (APRESOLINE) 100 MG tablet Take 1 tablet (100 mg total) by mouth 3 (three) times daily. 270 tablet 3   insulin glargine (LANTUS) 100 UNIT/ML Solostar Pen Inject 10 Units into the skin daily. 15 mL 1   Insulin Pen Needle 31G X 4 MM MISC Use to administer insulin daily. 100 each 3   levothyroxine (SYNTHROID) 50 MCG tablet TAKE 1 TABLET(50 MCG) BY MOUTH EVERY DAY 90 tablet 0   rosuvastatin (CRESTOR) 40 MG tablet Take 1 tablet (40 mg total) by mouth daily with supper. 90 tablet 3   sodium bicarbonate 650 MG tablet Take 650 mg by mouth 4 (four) times daily.     No current facility-administered medications for this visit.      ROS:  See HPI  Physical Exam:  Incision: Left AC fossa incision well-healed without signs of infection or hematoma Extremities: Left brachiobasilic fistula with great thrill on palpation throughout the upper arm.  2+ palpable left radial pulse Neuro: Intact motor and sensation of left upper extremity  Studies: Dialysis Duplex (10/13/2022) +--------------------+----------+-----------------+--------+  AVF                PSV (cm/s)Flow Vol (mL/min)Comments  +--------------------+----------+-----------------+--------+  Native artery inflow   395           765                 +--------------------+----------+-----------------+--------+  AVF Anastomosis        893                               +--------------------+----------+-----------------+--------+     +------------+----------+-------------+----------+--------+  OUTFLOW VEINPSV (cm/s)Diameter (cm)Depth (cm)Describe  +------------+----------+-------------+----------+--------+  Prox UA        104        0.86                         +------------+----------+-------------+----------+--------+  Mid UA         323  0.74                         +------------+----------+-------------+----------+--------+  Dist UA        497        0.44                            Assessment/Plan:  This is a 52 y.o. female who is here for postop visit  -She is s/p right IJ TDC placement and left brachiobasilic fistula creation on 09/05/2022 -Her left arm incision is well-healed without signs of infection such as erythema or drainage -On exam her left brachiobasilic fistula has a great thrill throughout the upper arm -She denies any symptoms of steal such as left hand pain, excessive coldness, or weakness.  She has a palpable left radial pulse -Duplex demonstrates a patent basilic fistula with decent flow volume of 765 ml/min.  Most of the fistula is well matured in size and greater than 6 mm in diameter.   There are high velocities at the anastomosis site of 893 cm/s.  We can address this area of stenosis during her second surgery. -We will schedule the patient for left arm second stage basilic fistula surgery in the next 2 to 3 weeks on a Monday, Wednesday, or Friday.  This will be done with Dr. Karin Lieu.   Loel Dubonnet, PA-C [facility] [phone]   Clinic MD:  Chestine Spore

## 2022-10-14 DIAGNOSIS — E1165 Type 2 diabetes mellitus with hyperglycemia: Secondary | ICD-10-CM | POA: Diagnosis not present

## 2022-10-15 DIAGNOSIS — E1165 Type 2 diabetes mellitus with hyperglycemia: Secondary | ICD-10-CM | POA: Diagnosis not present

## 2022-10-16 DIAGNOSIS — E1165 Type 2 diabetes mellitus with hyperglycemia: Secondary | ICD-10-CM | POA: Diagnosis not present

## 2022-10-17 DIAGNOSIS — E1165 Type 2 diabetes mellitus with hyperglycemia: Secondary | ICD-10-CM | POA: Diagnosis not present

## 2022-10-17 DIAGNOSIS — Z23 Encounter for immunization: Secondary | ICD-10-CM | POA: Diagnosis not present

## 2022-10-17 DIAGNOSIS — E1122 Type 2 diabetes mellitus with diabetic chronic kidney disease: Secondary | ICD-10-CM | POA: Diagnosis not present

## 2022-10-17 DIAGNOSIS — N186 End stage renal disease: Secondary | ICD-10-CM | POA: Diagnosis not present

## 2022-10-17 DIAGNOSIS — Z992 Dependence on renal dialysis: Secondary | ICD-10-CM | POA: Diagnosis not present

## 2022-10-17 DIAGNOSIS — D689 Coagulation defect, unspecified: Secondary | ICD-10-CM | POA: Diagnosis not present

## 2022-10-17 DIAGNOSIS — D631 Anemia in chronic kidney disease: Secondary | ICD-10-CM | POA: Diagnosis not present

## 2022-10-17 DIAGNOSIS — N2581 Secondary hyperparathyroidism of renal origin: Secondary | ICD-10-CM | POA: Diagnosis not present

## 2022-10-17 DIAGNOSIS — E039 Hypothyroidism, unspecified: Secondary | ICD-10-CM | POA: Diagnosis not present

## 2022-10-18 DIAGNOSIS — E1165 Type 2 diabetes mellitus with hyperglycemia: Secondary | ICD-10-CM | POA: Diagnosis not present

## 2022-10-19 ENCOUNTER — Other Ambulatory Visit: Payer: Self-pay

## 2022-10-19 DIAGNOSIS — N2581 Secondary hyperparathyroidism of renal origin: Secondary | ICD-10-CM | POA: Diagnosis not present

## 2022-10-19 DIAGNOSIS — N186 End stage renal disease: Secondary | ICD-10-CM | POA: Diagnosis not present

## 2022-10-19 DIAGNOSIS — E1165 Type 2 diabetes mellitus with hyperglycemia: Secondary | ICD-10-CM | POA: Diagnosis not present

## 2022-10-19 DIAGNOSIS — Z992 Dependence on renal dialysis: Secondary | ICD-10-CM | POA: Diagnosis not present

## 2022-10-19 DIAGNOSIS — E1122 Type 2 diabetes mellitus with diabetic chronic kidney disease: Secondary | ICD-10-CM | POA: Diagnosis not present

## 2022-10-19 DIAGNOSIS — D689 Coagulation defect, unspecified: Secondary | ICD-10-CM | POA: Diagnosis not present

## 2022-10-19 DIAGNOSIS — E039 Hypothyroidism, unspecified: Secondary | ICD-10-CM | POA: Diagnosis not present

## 2022-10-19 DIAGNOSIS — D631 Anemia in chronic kidney disease: Secondary | ICD-10-CM | POA: Diagnosis not present

## 2022-10-19 DIAGNOSIS — Z23 Encounter for immunization: Secondary | ICD-10-CM | POA: Diagnosis not present

## 2022-10-20 DIAGNOSIS — E1165 Type 2 diabetes mellitus with hyperglycemia: Secondary | ICD-10-CM | POA: Diagnosis not present

## 2022-10-21 DIAGNOSIS — Z992 Dependence on renal dialysis: Secondary | ICD-10-CM | POA: Diagnosis not present

## 2022-10-21 DIAGNOSIS — Z23 Encounter for immunization: Secondary | ICD-10-CM | POA: Diagnosis not present

## 2022-10-21 DIAGNOSIS — D631 Anemia in chronic kidney disease: Secondary | ICD-10-CM | POA: Diagnosis not present

## 2022-10-21 DIAGNOSIS — E1122 Type 2 diabetes mellitus with diabetic chronic kidney disease: Secondary | ICD-10-CM | POA: Diagnosis not present

## 2022-10-21 DIAGNOSIS — N2581 Secondary hyperparathyroidism of renal origin: Secondary | ICD-10-CM | POA: Diagnosis not present

## 2022-10-21 DIAGNOSIS — D689 Coagulation defect, unspecified: Secondary | ICD-10-CM | POA: Diagnosis not present

## 2022-10-21 DIAGNOSIS — E1165 Type 2 diabetes mellitus with hyperglycemia: Secondary | ICD-10-CM | POA: Diagnosis not present

## 2022-10-21 DIAGNOSIS — N186 End stage renal disease: Secondary | ICD-10-CM | POA: Diagnosis not present

## 2022-10-21 DIAGNOSIS — E039 Hypothyroidism, unspecified: Secondary | ICD-10-CM | POA: Diagnosis not present

## 2022-10-22 DIAGNOSIS — E1165 Type 2 diabetes mellitus with hyperglycemia: Secondary | ICD-10-CM | POA: Diagnosis not present

## 2022-10-23 DIAGNOSIS — E1165 Type 2 diabetes mellitus with hyperglycemia: Secondary | ICD-10-CM | POA: Diagnosis not present

## 2022-10-24 DIAGNOSIS — D689 Coagulation defect, unspecified: Secondary | ICD-10-CM | POA: Diagnosis not present

## 2022-10-24 DIAGNOSIS — E1165 Type 2 diabetes mellitus with hyperglycemia: Secondary | ICD-10-CM | POA: Diagnosis not present

## 2022-10-24 DIAGNOSIS — N2581 Secondary hyperparathyroidism of renal origin: Secondary | ICD-10-CM | POA: Diagnosis not present

## 2022-10-24 DIAGNOSIS — Z992 Dependence on renal dialysis: Secondary | ICD-10-CM | POA: Diagnosis not present

## 2022-10-24 DIAGNOSIS — D509 Iron deficiency anemia, unspecified: Secondary | ICD-10-CM | POA: Diagnosis not present

## 2022-10-24 DIAGNOSIS — N186 End stage renal disease: Secondary | ICD-10-CM | POA: Diagnosis not present

## 2022-10-25 DIAGNOSIS — E1165 Type 2 diabetes mellitus with hyperglycemia: Secondary | ICD-10-CM | POA: Diagnosis not present

## 2022-10-26 DIAGNOSIS — E1165 Type 2 diabetes mellitus with hyperglycemia: Secondary | ICD-10-CM | POA: Diagnosis not present

## 2022-10-26 DIAGNOSIS — Z992 Dependence on renal dialysis: Secondary | ICD-10-CM | POA: Diagnosis not present

## 2022-10-26 DIAGNOSIS — N2581 Secondary hyperparathyroidism of renal origin: Secondary | ICD-10-CM | POA: Diagnosis not present

## 2022-10-26 DIAGNOSIS — D689 Coagulation defect, unspecified: Secondary | ICD-10-CM | POA: Diagnosis not present

## 2022-10-26 DIAGNOSIS — D509 Iron deficiency anemia, unspecified: Secondary | ICD-10-CM | POA: Diagnosis not present

## 2022-10-26 DIAGNOSIS — N186 End stage renal disease: Secondary | ICD-10-CM | POA: Diagnosis not present

## 2022-10-27 DIAGNOSIS — E1165 Type 2 diabetes mellitus with hyperglycemia: Secondary | ICD-10-CM | POA: Diagnosis not present

## 2022-10-28 DIAGNOSIS — D689 Coagulation defect, unspecified: Secondary | ICD-10-CM | POA: Diagnosis not present

## 2022-10-28 DIAGNOSIS — N186 End stage renal disease: Secondary | ICD-10-CM | POA: Diagnosis not present

## 2022-10-28 DIAGNOSIS — D509 Iron deficiency anemia, unspecified: Secondary | ICD-10-CM | POA: Diagnosis not present

## 2022-10-28 DIAGNOSIS — E1165 Type 2 diabetes mellitus with hyperglycemia: Secondary | ICD-10-CM | POA: Diagnosis not present

## 2022-10-28 DIAGNOSIS — N2581 Secondary hyperparathyroidism of renal origin: Secondary | ICD-10-CM | POA: Diagnosis not present

## 2022-10-28 DIAGNOSIS — Z992 Dependence on renal dialysis: Secondary | ICD-10-CM | POA: Diagnosis not present

## 2022-10-29 DIAGNOSIS — E1165 Type 2 diabetes mellitus with hyperglycemia: Secondary | ICD-10-CM | POA: Diagnosis not present

## 2022-10-29 DIAGNOSIS — N186 End stage renal disease: Secondary | ICD-10-CM | POA: Diagnosis not present

## 2022-10-29 DIAGNOSIS — Z992 Dependence on renal dialysis: Secondary | ICD-10-CM | POA: Diagnosis not present

## 2022-10-30 DIAGNOSIS — E1165 Type 2 diabetes mellitus with hyperglycemia: Secondary | ICD-10-CM | POA: Diagnosis not present

## 2022-10-31 DIAGNOSIS — N2581 Secondary hyperparathyroidism of renal origin: Secondary | ICD-10-CM | POA: Diagnosis not present

## 2022-10-31 DIAGNOSIS — D509 Iron deficiency anemia, unspecified: Secondary | ICD-10-CM | POA: Diagnosis not present

## 2022-10-31 DIAGNOSIS — D631 Anemia in chronic kidney disease: Secondary | ICD-10-CM | POA: Diagnosis not present

## 2022-10-31 DIAGNOSIS — E1165 Type 2 diabetes mellitus with hyperglycemia: Secondary | ICD-10-CM | POA: Diagnosis not present

## 2022-10-31 DIAGNOSIS — D689 Coagulation defect, unspecified: Secondary | ICD-10-CM | POA: Diagnosis not present

## 2022-10-31 DIAGNOSIS — Z992 Dependence on renal dialysis: Secondary | ICD-10-CM | POA: Diagnosis not present

## 2022-10-31 DIAGNOSIS — N186 End stage renal disease: Secondary | ICD-10-CM | POA: Diagnosis not present

## 2022-11-01 ENCOUNTER — Ambulatory Visit: Payer: Medicaid Other | Admitting: Family Medicine

## 2022-11-01 ENCOUNTER — Encounter: Payer: Self-pay | Admitting: Family Medicine

## 2022-11-01 DIAGNOSIS — E785 Hyperlipidemia, unspecified: Secondary | ICD-10-CM | POA: Diagnosis not present

## 2022-11-01 DIAGNOSIS — E1165 Type 2 diabetes mellitus with hyperglycemia: Secondary | ICD-10-CM | POA: Diagnosis not present

## 2022-11-01 DIAGNOSIS — E782 Mixed hyperlipidemia: Secondary | ICD-10-CM | POA: Diagnosis not present

## 2022-11-01 DIAGNOSIS — I10 Essential (primary) hypertension: Secondary | ICD-10-CM | POA: Diagnosis not present

## 2022-11-01 DIAGNOSIS — E1169 Type 2 diabetes mellitus with other specified complication: Secondary | ICD-10-CM | POA: Diagnosis not present

## 2022-11-01 MED ORDER — BLOOD GLUCOSE TEST VI STRP: ORAL_STRIP | 0 refills | Status: DC

## 2022-11-01 MED ORDER — LANCETS MISC. MISC: 0 refills | Status: DC

## 2022-11-01 MED ORDER — FREESTYLE LIBRE 3 SENSOR MISC: 6 refills | Status: DC

## 2022-11-01 MED ORDER — LANCET DEVICE MISC: 0 refills | Status: AC

## 2022-11-01 MED ORDER — BLOOD GLUCOSE MONITORING SUPPL DEVI: 0 refills | Status: DC

## 2022-11-01 NOTE — Patient Instructions (Addendum)
Call the eye doctor office.  [phone]  Glucometer sent. Check fasting daily.  Insulin after dialysis.  Follow up in 1 month.

## 2022-11-02 DIAGNOSIS — N186 End stage renal disease: Secondary | ICD-10-CM | POA: Diagnosis not present

## 2022-11-02 DIAGNOSIS — D689 Coagulation defect, unspecified: Secondary | ICD-10-CM | POA: Diagnosis not present

## 2022-11-02 DIAGNOSIS — E1165 Type 2 diabetes mellitus with hyperglycemia: Secondary | ICD-10-CM | POA: Diagnosis not present

## 2022-11-02 DIAGNOSIS — D631 Anemia in chronic kidney disease: Secondary | ICD-10-CM | POA: Diagnosis not present

## 2022-11-02 DIAGNOSIS — N2581 Secondary hyperparathyroidism of renal origin: Secondary | ICD-10-CM | POA: Diagnosis not present

## 2022-11-02 DIAGNOSIS — Z992 Dependence on renal dialysis: Secondary | ICD-10-CM | POA: Diagnosis not present

## 2022-11-02 DIAGNOSIS — D509 Iron deficiency anemia, unspecified: Secondary | ICD-10-CM | POA: Diagnosis not present

## 2022-11-02 NOTE — Assessment & Plan Note (Signed)
Stable. Continue current anti-hypertensives.

## 2022-11-02 NOTE — Progress Notes (Signed)
Subjective:  Patient ID: Ana [name], female    DOB: 03-17-1970  Age: 52 y.o. MRN: [id number]  CC: Follow-up   HPI:  52 year old female with the below mentioned medical problems presents for follow-up.  Patient has yet to see ophthalmology.  Uncertain why this has not been done.  I will look at the referral and see what is going on.  Patient is essentially blind with very poor vision.  I suspect that this is due to uncontrolled type 2 diabetes.  Needs to see ophthalmology as soon as possible.  Last A1c 10.9.  Patient has not started her insulin.  She states that she has not started as she does not have a glucometer.  She lost it.  She is requesting a glucometer today.  I have advised her that she needs to start insulin as soon as possible.  Her diabetes is uncontrolled.  Blood pressure well-controlled on amlodipine, carvedilol, Lasix, and hydralazine.  She follows with nephrology.  She is on hemodialysis.  Hyperlipidemia is stable.  Most recent LDL 70 on Crestor.  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) 12/21/2021   Hypothyroidism, adult 09/02/2020   Vitamin D deficiency disease 09/02/2020   Type 2 diabetes mellitus with hyperlipidemia (HCC) 09/26/2017   Essential hypertension, benign 09/26/2017   Mixed hyperlipidemia 09/26/2017    Social Hx   Social History   Socioeconomic History   Marital status: Single    Spouse name: Not on file   Number of children: Not on file   Years of education: Not on file   Highest education level: Not on file  Occupational History   Not on file  Tobacco Use   Smoking status: Never   Smokeless tobacco: Never  Vaping Use   Vaping status: Never Used  Substance and Sexual Activity   Alcohol  use: Not Currently    Comment: occ   Drug use: No   Sexual activity: Yes    Birth control/protection: Surgical  Other Topics Concern   Not on file  Social History Narrative   Divorced,married for 8 years.Lives with daughter and 3 grandkids.Unemployed.   Social Determinants of Health   Financial Resource Strain: Not on file  Food Insecurity: No Food Insecurity (12/22/2021)   Hunger Vital Sign    Worried About Running Out of Food in the Last Year: Never true    Ran Out of Food in the Last Year: Never true  Transportation Needs: No Transportation Needs (12/22/2021)   PRAPARE - Administrator, Civil Service (Medical): No    Lack of Transportation (Non-Medical): No  Physical Activity: Not on file  Stress: Not on file  Social Connections: Not on file    Review of Systems  Eyes:  Positive for visual disturbance.  Respiratory: Negative.    Cardiovascular: Negative.    Objective:  BP 137/73   Pulse 72   Temp 97.7 F (36.5 C) (Oral)   Wt 185 lb 12.8 oz (84.3 kg)   LMP 09/22/2019 (Approximate)   SpO2 99%   BMI 36.29 kg/m      11/01/2022    1:13 PM 10/13/2022    2:03 PM 09/22/2022   11:40 AM  BP/Weight  Systolic BP 137 117 138  Diastolic BP 73 70 72  Wt. (Lbs) 185.8 188 187.4  BMI 36.29 kg/m2 36.72 kg/m2 36.6 kg/m2    Physical Exam Vitals reviewed.  Constitutional:      General: She is not in acute distress.    Appearance: Normal appearance.  Cardiovascular:     Rate and Rhythm: Normal rate and regular rhythm.  Pulmonary:     Effort: Pulmonary effort is normal.     Breath sounds: Normal breath sounds. No wheezing or rales.  Neurological:     Mental Status: She is alert.  Psychiatric:        Mood and Affect: Mood normal.        Behavior: Behavior normal.     Lab Results  Component Value Date   WBC 7.9 08/23/2022   HGB 10.9 (L) 09/05/2022   HCT 32.0 (L) 09/05/2022   PLT 290 08/23/2022   GLUCOSE 378 (H) 09/05/2022   CHOL 133 08/04/2022   TRIG  119 08/04/2022   HDL 42 08/04/2022   LDLCALC 70 08/04/2022   ALT 34 03/18/2022   AST 36 03/18/2022   NA 135 09/05/2022   K 4.3 09/05/2022   CL 98 09/05/2022   CREATININE 6.80 (H) 09/05/2022   BUN 79 (H) 09/05/2022   CO2 25 08/23/2022   TSH 2.013 03/18/2022   INR 1.0 12/22/2021   HGBA1C 10.9 (H) 08/04/2022     Assessment & Plan:   Problem List Items Addressed This Visit       Cardiovascular and Mediastinum   Essential hypertension, benign    Stable.  Continue current antihypertensives.        Endocrine   Type 2 diabetes mellitus with hyperlipidemia (HCC) - Primary    Uncontrolled.  I sent in testing supplies.  Will see if we can get her on a continuous glucometer as well.  Advised to start insulin.        Other   Mixed hyperlipidemia    Stable.  Continue Crestor.       Meds ordered this encounter  Medications   Blood Glucose Monitoring Suppl DEVI    Sig: Use up to 4 times daily to check blood sugar. May substitute to any manufacturer covered by patient's insurance.    Dispense:  1 each    Refill:  0   Glucose Blood (BLOOD GLUCOSE TEST STRIPS) STRP    Sig: Use up to 4 times daily to check blood sugar. May substitute to any manufacturer covered by patient's insurance.    Dispense:  100 strip    Refill:  0   Lancet Device MISC    Sig: Use up to 4 times daily to check blood sugar. May substitute to any manufacturer covered by patient's insurance.    Dispense:  1 each    Refill:  0   Lancets Misc. MISC    Sig: Use up to 4 times daily to check blood sugar. May substitute to any manufacturer covered by patient's insurance.    Dispense:  100 each    Refill:  0   Continuous Glucose Sensor (FREESTYLE LIBRE 3 SENSOR) MISC    Sig: Place 1 sensor on the skin every 14 days. Use to check glucose continuously    Dispense:  2 each    Refill:  6    Follow-up:  1 month  Yecenia Dalgleish DO [facility]

## 2022-11-02 NOTE — Assessment & Plan Note (Signed)
Uncontrolled.  I sent in testing supplies.  Will see if we can get her on a continuous glucometer as well.  Advised to start insulin.

## 2022-11-02 NOTE — Assessment & Plan Note (Signed)
Stable. Continue Crestor. 

## 2022-11-03 DIAGNOSIS — E1165 Type 2 diabetes mellitus with hyperglycemia: Secondary | ICD-10-CM | POA: Diagnosis not present

## 2022-11-04 DIAGNOSIS — D509 Iron deficiency anemia, unspecified: Secondary | ICD-10-CM | POA: Diagnosis not present

## 2022-11-04 DIAGNOSIS — D689 Coagulation defect, unspecified: Secondary | ICD-10-CM | POA: Diagnosis not present

## 2022-11-04 DIAGNOSIS — E1165 Type 2 diabetes mellitus with hyperglycemia: Secondary | ICD-10-CM | POA: Diagnosis not present

## 2022-11-04 DIAGNOSIS — Z992 Dependence on renal dialysis: Secondary | ICD-10-CM | POA: Diagnosis not present

## 2022-11-04 DIAGNOSIS — N2581 Secondary hyperparathyroidism of renal origin: Secondary | ICD-10-CM | POA: Diagnosis not present

## 2022-11-04 DIAGNOSIS — N186 End stage renal disease: Secondary | ICD-10-CM | POA: Diagnosis not present

## 2022-11-04 DIAGNOSIS — D631 Anemia in chronic kidney disease: Secondary | ICD-10-CM | POA: Diagnosis not present

## 2022-11-05 DIAGNOSIS — E1165 Type 2 diabetes mellitus with hyperglycemia: Secondary | ICD-10-CM | POA: Diagnosis not present

## 2022-11-06 DIAGNOSIS — E1165 Type 2 diabetes mellitus with hyperglycemia: Secondary | ICD-10-CM | POA: Diagnosis not present

## 2022-11-07 DIAGNOSIS — N186 End stage renal disease: Secondary | ICD-10-CM | POA: Diagnosis not present

## 2022-11-07 DIAGNOSIS — N2581 Secondary hyperparathyroidism of renal origin: Secondary | ICD-10-CM | POA: Diagnosis not present

## 2022-11-07 DIAGNOSIS — D689 Coagulation defect, unspecified: Secondary | ICD-10-CM | POA: Diagnosis not present

## 2022-11-07 DIAGNOSIS — D509 Iron deficiency anemia, unspecified: Secondary | ICD-10-CM | POA: Diagnosis not present

## 2022-11-07 DIAGNOSIS — Z992 Dependence on renal dialysis: Secondary | ICD-10-CM | POA: Diagnosis not present

## 2022-11-07 DIAGNOSIS — E1165 Type 2 diabetes mellitus with hyperglycemia: Secondary | ICD-10-CM | POA: Diagnosis not present

## 2022-11-08 DIAGNOSIS — E1165 Type 2 diabetes mellitus with hyperglycemia: Secondary | ICD-10-CM | POA: Diagnosis not present

## 2022-11-09 ENCOUNTER — Encounter (HOSPITAL_COMMUNITY): Payer: Self-pay | Admitting: Vascular Surgery

## 2022-11-09 DIAGNOSIS — D509 Iron deficiency anemia, unspecified: Secondary | ICD-10-CM | POA: Diagnosis not present

## 2022-11-09 DIAGNOSIS — N186 End stage renal disease: Secondary | ICD-10-CM | POA: Diagnosis not present

## 2022-11-09 DIAGNOSIS — N2581 Secondary hyperparathyroidism of renal origin: Secondary | ICD-10-CM | POA: Diagnosis not present

## 2022-11-09 DIAGNOSIS — E1165 Type 2 diabetes mellitus with hyperglycemia: Secondary | ICD-10-CM | POA: Diagnosis not present

## 2022-11-09 DIAGNOSIS — D689 Coagulation defect, unspecified: Secondary | ICD-10-CM | POA: Diagnosis not present

## 2022-11-09 DIAGNOSIS — Z992 Dependence on renal dialysis: Secondary | ICD-10-CM | POA: Diagnosis not present

## 2022-11-10 ENCOUNTER — Other Ambulatory Visit: Payer: Self-pay

## 2022-11-10 ENCOUNTER — Encounter (HOSPITAL_COMMUNITY): Payer: Self-pay | Admitting: Vascular Surgery

## 2022-11-10 DIAGNOSIS — E1165 Type 2 diabetes mellitus with hyperglycemia: Secondary | ICD-10-CM | POA: Diagnosis not present

## 2022-11-10 NOTE — Progress Notes (Addendum)
Choose an anesthesia record to view details        DISCUSSION: Patient is a 52 year old female scheduled for LEFT ARM SECOND STAGE BASILIC FISTULA on 11/13/22 with Dr. Karin Lieu. History includes never smoker, DM2, HTN, ESRD (Tu/Thurs/Sat), HFpEF, anemia of chronic disease (/sp Retacrit), Visually impaired per 08/04/22 office note by Dr. Adriana Simas.   She underwent Left AV fistula creation and TDC placement on 09/05/22 . There were no surgical or anesthesia complications.  Last cardiology visit was on 08/23/22 by Randall An, PA-C for follow-up HTN, HFpEF. She was overall doing well since her last office visit without hospitalizations. Nephrology had been managing diuretic therapy, and her weight was actually down 7 lbs. BP 142/7, 152/78. She had inadvertently been taking Coreg only once a day, so she discussed taking BID as prescribed. On Crestor for HLD.  No chest pain, palpitations, dyspnea on exertion, orthopnea, PND or pitting edema. Six month follow-up planned.   Last seen by PCP on 11/01/22. Per Dr. Adriana Simas: "Last A1c 10.9. Patient has not started her insulin. She states that she has not started as she does not have a glucometer. She lost it. She is requesting a glucometer today. I have advised her that she needs to start insulin as soon as possible. Her diabetes is uncontrolled."  Will eval with [facility] on DOS. She has an appt with Endocrinology in October  VS: LMP 09/22/2019 (Approximate)   PROVIDERS: PCP - Dorie Rank Cook,DO Cardiologist - Gunnar Fusi Ross,MD Nephrologist - Manpreet Bhutani,MD. Pt has dialysis TThSat   LABS: Obtain DOS (all labs ordered are listed, but only abnormal results are displayed)  Labs Reviewed - No data to display   IMAGES:  CXR 09/05/22:  IMPRESSION: 1. Right internal jugular approach hemodialysis catheter terminates at the level of the right atrium. No pneumothorax. 2. Cardiomegaly and pulmonary vascular congestion.  EKG:   CV:  Echo 11/14/2021:  IMPRESSIONS      1. Left ventricular ejection fraction, by estimation, is 60 to 65%. The  left ventricle has normal function. The left ventricle has no regional  wall motion abnormalities. Left ventricular diastolic parameters are  consistent with Grade II diastolic dysfunction (pseudonormalization). Elevated left ventricular end-diastolic  pressure.   2. Right ventricular systolic function was not well visualized. The right  ventricular size is not well visualized. Tricuspid regurgitation signal is  inadequate for assessing PA pressure.   3. Left atrial size was moderately dilated.   4. A small pericardial effusion is present. The pericardial effusion is  circumferential. There is no evidence of cardiac tamponade.   5. The mitral valve is normal in structure. No evidence of mitral valve  regurgitation. No evidence of mitral stenosis.   6. The aortic valve is tricuspid. Aortic valve regurgitation is not  visualized. Aortic valve sclerosis/calcification is present, without any  evidence of aortic stenosis.   Past Medical History:  Diagnosis Date   (HFpEF) heart failure with preserved ejection fraction (HCC)    a. echo in 10/2021 showing preserved EF of 60-65% with Grade 2 DD and small pericardial effusion.   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   Diabetes mellitus    type 2, diet controlled   Hypertension     Past Surgical History:  Procedure Laterality Date   AV FISTULA PLACEMENT Left 09/05/2022   Procedure: LEFT BRACHIO BASILIC ARTERIOVENOUS (AV) FISTULA CREATION;  Surgeon: Victorino Sparrow, MD;  Location: [facility] OR;  Service: Vascular;  Laterality: Left;   CESAREAN SECTION  x4   CHOLECYSTECTOMY     EYE SURGERY     INSERTION OF DIALYSIS CATHETER Right 09/05/2022   Procedure: INSERTION OF TUNNELED DIALYSIS CATHETER RIGHT INTERNAL JUIGULAR VEIN;  Surgeon: Victorino Sparrow, MD;  Location: [facility] OR;  Service: Vascular;  Laterality: Right;  lma   MASS  EXCISION  10/27/2011   Procedure: EXCISION MASS;  Surgeon: Fabio Bering, MD;  Location: [facility] ORS;  Service: General;  Laterality: Right;   TUBAL LIGATION      MEDICATIONS: No current facility-administered medications for this encounter.    amLODipine (NORVASC) 10 MG tablet   calcitRIOL (ROCALTROL) 0.25 MCG capsule   carvedilol (COREG) 25 MG tablet   furosemide (LASIX) 40 MG tablet   hydrALAZINE (APRESOLINE) 100 MG tablet   levothyroxine (SYNTHROID) 50 MCG tablet   rosuvastatin (CRESTOR) 40 MG tablet   sodium bicarbonate 650 MG tablet   Blood Glucose Monitoring Suppl DEVI   Continuous Glucose Sensor (FREESTYLE LIBRE 3 SENSOR) MISC   Glucose Blood (BLOOD GLUCOSE TEST STRIPS) STRP   insulin glargine (LANTUS) 100 UNIT/ML Solostar Pen   Insulin Pen Needle 31G X 4 MM MISC   Lancet Device MISC   Lancets Misc. MISC   Marcille Blanco [facility] Surgical Short Stay/Anesthesiology [facility] Phone [phone] 11/10/2022 2:51 PM

## 2022-11-10 NOTE — Progress Notes (Signed)
SDW CALL  Patient was given pre-op instructions over the phone. The opportunity was given for the patient to ask questions. No further questions asked. Patient verbalized understanding of instructions given. Also review instructions with pt's daughter,Crystal, since pt is blind and could not write down meds, times, short stay phone number. Crystal verbalized understanding and was given the opportunity to ask questions.    PCP - Dorie Rank Cook,DO Cardiologist - Gunnar Fusi Ross,MD Nephrologist - Manpreet Bhutani,MD. Pt has dialysis TThSat  PPM/ICD - denied Device Orders -  Rep Notified -   Chest x-ray - 11/14/21 EKG - 03/18/22 Stress Test - none ECHO - 11/14/21 Cardiac Cath - denied  Sleep Study - denies CPAP - no  Fasting Blood Sugar - 160-300 Checks Blood Sugar one time a day  Blood Thinner Instructions:na Aspirin Instructions:na   COVID TEST- na   Anesthesia review: yes  Patient denies shortness of breath, fever, cough and chest pain over the phone call    Surgical Instructions    Your procedure is scheduled on September 16  Report to [facility] Main Entrance "A" at 0830 A.M., then check in with the Admitting office.  Call this number if you have problems the morning of surgery:  [phone]    Remember:  Do not eat or drink anything after midnight the night before your surgery    Take these medicines the morning of surgery with A SIP OF WATER: Amlodipine,Coreg,hydralazine,synthroid,crestor  As of today, STOP taking any Aspirin (unless otherwise instructed by your surgeon) Aleve, Naproxen, Ibuprofen, Motrin, Advil, Goody's, BC's, all herbal medications, fish oil, and all vitamins.   WHAT DO I DO ABOUT MY DIABETES MEDICATION?  THE MORNING OF SURGERY, take 5 units of glargine(lantus)insulin.   Check your blood sugar the morning of your surgery when you wake up and every 2 hours until you get to the Short Stay unit.  If your blood sugar is less than 70 mg/dL, you  will need to treat for low blood sugar: Do not take insulin. Treat a low blood sugar (less than 70 mg/dL) with  cup of clear juice (cranberry or apple), 4 glucose tablets, OR glucose gel. Recheck blood sugar in 15 minutes after treatment (to make sure it is greater than 70 mg/dL). If your blood sugar is not greater than 70 mg/dL on recheck, call [phone] for further instructions. Report your blood sugar to the short stay nurse when you get to Short Stay.  [facility] is not responsible for any belongings or valuables. .   Do NOT Smoke (Tobacco/Vaping)  24 hours prior to your procedure  If you use a CPAP at night, you may bring your mask for your overnight stay.   Contacts, glasses, hearing aids, dentures or partials may not be worn into surgery, please bring cases for these belongings   Patients discharged the day of surgery will not be allowed to drive home, and someone needs to stay with them for 24 hours.    Special instructions:    Oral Hygiene is also important to reduce your risk of infection.  Remember - BRUSH YOUR TEETH THE MORNING OF SURGERY WITH YOUR REGULAR TOOTHPASTE   Day of Surgery:  Take a shower the day of or night before with antibacterial soap. Wear Clean/Comfortable clothing the morning of surgery Do not apply any deodorants/lotions.   Do not wear jewelry or makeup Do not wear lotions, powders, perfumes/colognes, or deodorant. Do not shave 48 hours prior to surgery.  Men may shave face and  neck. Do not bring valuables to the hospital. Do not wear nail polish, gel polish, artificial nails, or any other type of covering on natural nails (fingers and toes) If you have artificial nails or gel coating that need to be removed by a nail salon, please have this removed prior to surgery. Artificial nails or gel coating may interfere with anesthesia's ability to adequately monitor your vital signs. Remember to brush your teeth WITH YOUR REGULAR TOOTHPASTE.

## 2022-11-10 NOTE — Anesthesia Preprocedure Evaluation (Signed)
Anesthesia Evaluation  Patient identified by MRN, date of birth, ID band Patient awake    Reviewed: Allergy & Precautions, NPO status , Patient's Chart, lab work & pertinent test results  History of Anesthesia Complications Negative for: history of anesthetic complications  Airway Mallampati: III  TM Distance: >3 FB Neck ROM: Full    Dental  (+) Dental Advisory Given   Pulmonary neg shortness of breath, neg sleep apnea, neg COPD, neg recent URI   breath sounds clear to auscultation       Cardiovascular hypertension, Pt. on medications and Pt. on home beta blockers (-) angina +CHF  (-) Past MI  Rhythm:Regular     Neuro/Psych negative neurological ROS  negative psych ROS   GI/Hepatic Neg liver ROS,,,  Endo/Other  diabetesHypothyroidism  Lab Results      Component                Value               Date                      HGBA1C                   10.9 (H)            08/04/2022             Renal/GU Renal diseaseLab Results      Component                Value               Date                      NA                       138                 11/13/2022                K                        3.4 (L)             11/13/2022                CO2                      25                  08/23/2022                GLUCOSE                  219 (H)             11/13/2022                BUN                      34 (H)              11/13/2022                CREATININE               6.00 (H)            11/13/2022  CALCIUM                  9.2                 08/23/2022                EGFR                     27 (L)              10/01/2020                GFRNONAA                 7 (L)               08/23/2022                Musculoskeletal negative musculoskeletal ROS (+)    Abdominal   Peds  Hematology  (+) Blood dyscrasia, anemia Lab Results      Component                Value               Date                       WBC                      7.9                 08/23/2022                HGB                      10.9 (L)            09/05/2022                HCT                      32.0 (L)            09/05/2022                MCV                      88.7                08/23/2022                PLT                      290                 08/23/2022              Anesthesia Other Findings   Reproductive/Obstetrics                             Anesthesia Physical Anesthesia Plan  ASA: 4  Anesthesia Plan: General   Post-op Pain Management:    Induction: Intravenous  PONV Risk Score and Plan: 3 and Ondansetron and Dexamethasone  Airway Management Planned: LMA  Additional Equipment: None  Intra-op Plan:   Post-operative Plan: Extubation in OR  Informed Consent: I have reviewed the patients History and Physical, chart, labs and discussed the procedure including the  risks, benefits and alternatives for the proposed anesthesia with the patient or authorized representative who has indicated his/her understanding and acceptance.     Dental advisory given  Plan Discussed with: CRNA  Anesthesia Plan Comments: (See PAT note from 9/13 by Sherlie Ban PA-C )        Anesthesia Quick Evaluation

## 2022-11-11 DIAGNOSIS — N186 End stage renal disease: Secondary | ICD-10-CM | POA: Diagnosis not present

## 2022-11-11 DIAGNOSIS — D509 Iron deficiency anemia, unspecified: Secondary | ICD-10-CM | POA: Diagnosis not present

## 2022-11-11 DIAGNOSIS — E1165 Type 2 diabetes mellitus with hyperglycemia: Secondary | ICD-10-CM | POA: Diagnosis not present

## 2022-11-11 DIAGNOSIS — N2581 Secondary hyperparathyroidism of renal origin: Secondary | ICD-10-CM | POA: Diagnosis not present

## 2022-11-11 DIAGNOSIS — Z992 Dependence on renal dialysis: Secondary | ICD-10-CM | POA: Diagnosis not present

## 2022-11-11 DIAGNOSIS — D689 Coagulation defect, unspecified: Secondary | ICD-10-CM | POA: Diagnosis not present

## 2022-11-12 DIAGNOSIS — E1165 Type 2 diabetes mellitus with hyperglycemia: Secondary | ICD-10-CM | POA: Diagnosis not present

## 2022-11-13 ENCOUNTER — Ambulatory Visit (HOSPITAL_COMMUNITY)
Admission: RE | Admit: 2022-11-13 | Discharge: 2022-11-13 | Disposition: A | Discharge status: Home / Self Care | Payer: Medicaid Other | Source: Ambulatory Visit | Attending: Vascular Surgery | Admitting: Vascular Surgery

## 2022-11-13 ENCOUNTER — Ambulatory Visit (HOSPITAL_COMMUNITY): Payer: Self-pay | Admitting: Medical

## 2022-11-13 ENCOUNTER — Other Ambulatory Visit: Payer: Self-pay

## 2022-11-13 ENCOUNTER — Encounter (HOSPITAL_COMMUNITY): Payer: Self-pay | Admitting: Vascular Surgery

## 2022-11-13 ENCOUNTER — Encounter (HOSPITAL_COMMUNITY)
Admission: RE | Disposition: A | Discharge status: Home / Self Care | Payer: Self-pay | Source: Ambulatory Visit | Attending: Vascular Surgery

## 2022-11-13 DIAGNOSIS — I1311 Hypertensive heart and chronic kidney disease without heart failure, with stage 5 chronic kidney disease, or end stage renal disease: Secondary | ICD-10-CM | POA: Diagnosis not present

## 2022-11-13 DIAGNOSIS — I082 Rheumatic disorders of both aortic and tricuspid valves: Secondary | ICD-10-CM | POA: Insufficient documentation

## 2022-11-13 DIAGNOSIS — N189 Chronic kidney disease, unspecified: Secondary | ICD-10-CM

## 2022-11-13 DIAGNOSIS — E782 Mixed hyperlipidemia: Secondary | ICD-10-CM

## 2022-11-13 DIAGNOSIS — N185 Chronic kidney disease, stage 5: Secondary | ICD-10-CM | POA: Diagnosis not present

## 2022-11-13 DIAGNOSIS — N186 End stage renal disease: Secondary | ICD-10-CM | POA: Diagnosis not present

## 2022-11-13 DIAGNOSIS — E1122 Type 2 diabetes mellitus with diabetic chronic kidney disease: Secondary | ICD-10-CM | POA: Diagnosis present

## 2022-11-13 DIAGNOSIS — I509 Heart failure, unspecified: Secondary | ICD-10-CM | POA: Diagnosis not present

## 2022-11-13 DIAGNOSIS — E039 Hypothyroidism, unspecified: Secondary | ICD-10-CM

## 2022-11-13 DIAGNOSIS — I5032 Chronic diastolic (congestive) heart failure: Secondary | ICD-10-CM | POA: Diagnosis not present

## 2022-11-13 DIAGNOSIS — Z992 Dependence on renal dialysis: Secondary | ICD-10-CM | POA: Diagnosis not present

## 2022-11-13 DIAGNOSIS — I3139 Other pericardial effusion (noninflammatory): Secondary | ICD-10-CM | POA: Diagnosis not present

## 2022-11-13 DIAGNOSIS — I132 Hypertensive heart and chronic kidney disease with heart failure and with stage 5 chronic kidney disease, or end stage renal disease: Secondary | ICD-10-CM | POA: Diagnosis not present

## 2022-11-13 DIAGNOSIS — E1165 Type 2 diabetes mellitus with hyperglycemia: Secondary | ICD-10-CM | POA: Diagnosis not present

## 2022-11-13 HISTORY — DX: Anemia, unspecified: D64.9

## 2022-11-13 HISTORY — PX: BASCILIC VEIN TRANSPOSITION: SHX5742

## 2022-11-13 HISTORY — DX: Heart failure, unspecified: I50.9

## 2022-11-13 LAB — POCT I-STAT, CHEM 8
BUN: 34 mg/dL — ABNORMAL HIGH (ref 6–20)
Calcium, Ion: 1.17 mmol/L (ref 1.15–1.40)
Chloride: 93 mmol/L — ABNORMAL LOW (ref 98–111)
Creatinine, Ser: 6 mg/dL — ABNORMAL HIGH (ref 0.44–1.00)
Glucose, Bld: 219 mg/dL — ABNORMAL HIGH (ref 70–99)
HCT: 31 % — ABNORMAL LOW (ref 36.0–46.0)
Hemoglobin: 10.5 g/dL — ABNORMAL LOW (ref 12.0–15.0)
Potassium: 3.4 mmol/L — ABNORMAL LOW (ref 3.5–5.1)
Sodium: 138 mmol/L (ref 135–145)
TCO2: 29 mmol/L (ref 22–32)

## 2022-11-13 LAB — GLUCOSE, CAPILLARY
Glucose-Capillary: 201 mg/dL — ABNORMAL HIGH (ref 70–99)
Glucose-Capillary: 221 mg/dL — ABNORMAL HIGH (ref 70–99)

## 2022-11-13 SURGERY — TRANSPOSITION, VEIN, BASILIC
Anesthesia: General | Site: Arm Upper | Laterality: Left

## 2022-11-13 MED ORDER — CHLORHEXIDINE GLUCONATE 4 % EX SOLN: 60.0000 mL | Freq: Once | CUTANEOUS | Status: DC

## 2022-11-13 MED ORDER — DEXAMETHASONE SODIUM PHOSPHATE 10 MG/ML IJ SOLN
INTRAMUSCULAR | Status: AC
  Filled 2022-11-13: qty 1

## 2022-11-13 MED ORDER — INSULIN ASPART 100 UNIT/ML IJ SOLN
0.0000 [IU] | INTRAMUSCULAR | Status: DC | PRN
  Administered 2022-11-13: 2 [IU] via SUBCUTANEOUS

## 2022-11-13 MED ORDER — CHLORHEXIDINE GLUCONATE 0.12 % MT SOLN
15.0000 mL | Freq: Once | OROMUCOSAL | Status: AC
  Administered 2022-11-13: 15 mL via OROMUCOSAL
  Filled 2022-11-13: qty 15

## 2022-11-13 MED ORDER — PROPOFOL 10 MG/ML IV BOLUS
INTRAVENOUS | Status: AC
  Filled 2022-11-13: qty 20

## 2022-11-13 MED ORDER — INSULIN ASPART 100 UNIT/ML IJ SOLN
INTRAMUSCULAR | Status: AC
  Filled 2022-11-13: qty 1

## 2022-11-13 MED ORDER — ONDANSETRON HCL 4 MG/2ML IJ SOLN
INTRAMUSCULAR | Status: AC
  Filled 2022-11-13: qty 2

## 2022-11-13 MED ORDER — CEFAZOLIN SODIUM-DEXTROSE 2-4 GM/100ML-% IV SOLN
2.0000 g | INTRAVENOUS | Status: AC
  Administered 2022-11-13: 2 g via INTRAVENOUS
  Filled 2022-11-13: qty 100

## 2022-11-13 MED ORDER — DROPERIDOL 2.5 MG/ML IJ SOLN
INTRAMUSCULAR | Status: AC
  Administered 2022-11-13: 0.625 mg via INTRAVENOUS
  Filled 2022-11-13: qty 2

## 2022-11-13 MED ORDER — PROTAMINE SULFATE 10 MG/ML IV SOLN
INTRAVENOUS | Status: DC | PRN
  Administered 2022-11-13: 30 mg via INTRAVENOUS

## 2022-11-13 MED ORDER — PROPOFOL 1000 MG/100ML IV EMUL
INTRAVENOUS | Status: AC
  Filled 2022-11-13: qty 100

## 2022-11-13 MED ORDER — PROTAMINE SULFATE 10 MG/ML IV SOLN
INTRAVENOUS | Status: AC
  Filled 2022-11-13: qty 5

## 2022-11-13 MED ORDER — OXYCODONE HCL 5 MG PO TABS: 5.0000 mg | ORAL_TABLET | Freq: Once | ORAL | Status: DC | PRN

## 2022-11-13 MED ORDER — SODIUM CHLORIDE 0.9 % IV SOLN: INTRAVENOUS | Status: DC

## 2022-11-13 MED ORDER — MIDAZOLAM HCL 2 MG/2ML IJ SOLN
INTRAMUSCULAR | Status: AC
  Filled 2022-11-13: qty 2

## 2022-11-13 MED ORDER — LIDOCAINE-EPINEPHRINE (PF) 1 %-1:200000 IJ SOLN
INTRAMUSCULAR | Status: AC
  Filled 2022-11-13: qty 30

## 2022-11-13 MED ORDER — HYDROMORPHONE HCL 1 MG/ML IJ SOLN
INTRAMUSCULAR | Status: DC | PRN
Start: 2022-11-13 — End: 2022-11-13
  Administered 2022-11-13: .5 mg via INTRAVENOUS

## 2022-11-13 MED ORDER — FENTANYL CITRATE (PF) 100 MCG/2ML IJ SOLN
25.0000 ug | INTRAMUSCULAR | Status: DC | PRN
  Administered 2022-11-13: 25 ug via INTRAVENOUS

## 2022-11-13 MED ORDER — 0.9 % SODIUM CHLORIDE (POUR BTL) OPTIME
TOPICAL | Status: DC | PRN
  Administered 2022-11-13: 1000 mL

## 2022-11-13 MED ORDER — HEPARIN SODIUM (PORCINE) 1000 UNIT/ML IJ SOLN
INTRAMUSCULAR | Status: DC | PRN
Start: 2022-11-13 — End: 2022-11-13
  Administered 2022-11-13: 5000 [IU] via INTRAVENOUS

## 2022-11-13 MED ORDER — ACETAMINOPHEN 10 MG/ML IV SOLN: 1000.0000 mg | Freq: Once | INTRAVENOUS | Status: DC | PRN

## 2022-11-13 MED ORDER — LIDOCAINE 2% (20 MG/ML) 5 ML SYRINGE
INTRAMUSCULAR | Status: DC | PRN
  Administered 2022-11-13: 60 mg via INTRAVENOUS

## 2022-11-13 MED ORDER — PHENYLEPHRINE 80 MCG/ML (10ML) SYRINGE FOR IV PUSH (FOR BLOOD PRESSURE SUPPORT)
PREFILLED_SYRINGE | INTRAVENOUS | Status: DC | PRN
  Administered 2022-11-13 (×2): 80 ug via INTRAVENOUS

## 2022-11-13 MED ORDER — PROPOFOL 10 MG/ML IV BOLUS
INTRAVENOUS | Status: DC | PRN
  Administered 2022-11-13: 150 mg via INTRAVENOUS
  Administered 2022-11-13: 50 mg via INTRAVENOUS

## 2022-11-13 MED ORDER — HEPARIN SODIUM (PORCINE) 1000 UNIT/ML IJ SOLN
INTRAMUSCULAR | Status: AC
  Filled 2022-11-13: qty 10

## 2022-11-13 MED ORDER — OXYCODONE HCL 5 MG PO TABS
ORAL_TABLET | ORAL | Status: AC
  Filled 2022-11-13: qty 1

## 2022-11-13 MED ORDER — HEPARIN 6000 UNIT IRRIGATION SOLUTION
Status: AC
  Filled 2022-11-13: qty 500

## 2022-11-13 MED ORDER — HYDROMORPHONE HCL 1 MG/ML IJ SOLN
INTRAMUSCULAR | Status: AC
  Filled 2022-11-13: qty 0.5

## 2022-11-13 MED ORDER — ACETAMINOPHEN 160 MG/5ML PO SOLN: 1000.0000 mg | Freq: Once | ORAL | Status: DC | PRN

## 2022-11-13 MED ORDER — ACETAMINOPHEN 500 MG PO TABS: 1000.0000 mg | ORAL_TABLET | Freq: Once | ORAL | Status: DC | PRN

## 2022-11-13 MED ORDER — HEMOSTATIC AGENTS (NO CHARGE) OPTIME
TOPICAL | Status: DC | PRN
Start: 2022-11-13 — End: 2022-11-13
  Administered 2022-11-13: 2 via TOPICAL

## 2022-11-13 MED ORDER — KETAMINE HCL 50 MG/5ML IJ SOSY
PREFILLED_SYRINGE | INTRAMUSCULAR | Status: AC
  Filled 2022-11-13: qty 5

## 2022-11-13 MED ORDER — FENTANYL CITRATE (PF) 100 MCG/2ML IJ SOLN
INTRAMUSCULAR | Status: AC
  Filled 2022-11-13: qty 2

## 2022-11-13 MED ORDER — ORAL CARE MOUTH RINSE: 15.0000 mL | Freq: Once | OROMUCOSAL | Status: AC

## 2022-11-13 MED ORDER — OXYCODONE-ACETAMINOPHEN 5-325 MG PO TABS
1.0000 | ORAL_TABLET | Freq: Four times a day (QID) | ORAL | 0 refills | Status: DC | PRN
Start: 2022-11-13 — End: 2023-02-13

## 2022-11-13 MED ORDER — OXYCODONE HCL 5 MG/5ML PO SOLN: 5.0000 mg | Freq: Once | ORAL | Status: DC | PRN

## 2022-11-13 MED ORDER — DROPERIDOL 2.5 MG/ML IJ SOLN: 0.6250 mg | Freq: Once | INTRAMUSCULAR | Status: AC | PRN

## 2022-11-13 MED ORDER — PHENYLEPHRINE HCL-NACL 20-0.9 MG/250ML-% IV SOLN
INTRAVENOUS | Status: DC | PRN
  Administered 2022-11-13: 40 ug/min via INTRAVENOUS

## 2022-11-13 MED ORDER — HEPARIN 6000 UNIT IRRIGATION SOLUTION
Status: DC | PRN
  Administered 2022-11-13: 1

## 2022-11-13 MED ORDER — DEXAMETHASONE SODIUM PHOSPHATE 10 MG/ML IJ SOLN
INTRAMUSCULAR | Status: DC | PRN
  Administered 2022-11-13: 4 mg via INTRAVENOUS

## 2022-11-13 MED ORDER — MIDAZOLAM HCL 2 MG/2ML IJ SOLN
INTRAMUSCULAR | Status: DC | PRN
  Administered 2022-11-13: 2 mg via INTRAVENOUS

## 2022-11-13 MED ORDER — ONDANSETRON HCL 4 MG/2ML IJ SOLN
INTRAMUSCULAR | Status: DC | PRN
  Administered 2022-11-13: 4 mg via INTRAVENOUS

## 2022-11-13 SURGICAL SUPPLY — 41 items
ADH SKN CLS APL DERMABOND .7 (GAUZE/BANDAGES/DRESSINGS) ×1
ARMBAND PINK RESTRICT EXTREMIT (MISCELLANEOUS) ×2 IMPLANT
BAG COUNTER SPONGE SURGICOUNT (BAG) ×1 IMPLANT
BAG SPNG CNTER NS LX DISP (BAG) ×1
BNDG CMPR 5X4 KNIT ELC UNQ LF (GAUZE/BANDAGES/DRESSINGS) ×1
BNDG ELASTIC 4INX 5YD STR LF (GAUZE/BANDAGES/DRESSINGS) IMPLANT
BNDG ELASTIC 4X5.8 VLCR STR LF (GAUZE/BANDAGES/DRESSINGS) ×2 IMPLANT
CANISTER SUCT 3000ML PPV (MISCELLANEOUS) ×1 IMPLANT
CLIP TI MEDIUM 24 (CLIP) ×1 IMPLANT
CLIP TI WIDE RED SMALL 24 (CLIP) ×2 IMPLANT
COVER PROBE W GEL 5X96 (DRAPES) ×1 IMPLANT
DERMABOND ADVANCED .7 DNX12 (GAUZE/BANDAGES/DRESSINGS) ×2 IMPLANT
ELECT REM PT RETURN 9FT ADLT (ELECTROSURGICAL) ×1
ELECTRODE REM PT RTRN 9FT ADLT (ELECTROSURGICAL) ×2 IMPLANT
GLOVE BIOGEL PI IND STRL 8 (GLOVE) ×1 IMPLANT
GOWN STRL REUS W/ TWL LRG LVL3 (GOWN DISPOSABLE) ×4 IMPLANT
GOWN STRL REUS W/TWL 2XL LVL3 (GOWN DISPOSABLE) ×2 IMPLANT
GOWN STRL REUS W/TWL LRG LVL3 (GOWN DISPOSABLE) ×2
HEMOSTAT SNOW SURGICEL 2X4 (HEMOSTASIS) IMPLANT
KIT BASIN OR (CUSTOM PROCEDURE TRAY) ×2 IMPLANT
KIT TURNOVER KIT B (KITS) ×1 IMPLANT
NDL HYPO 25GX1X1/2 BEV (NEEDLE) ×2 IMPLANT
NEEDLE HYPO 25GX1X1/2 BEV (NEEDLE) ×1 IMPLANT
NS IRRIG 1000ML POUR BTL (IV SOLUTION) ×1 IMPLANT
PACK CV ACCESS (CUSTOM PROCEDURE TRAY) ×1 IMPLANT
PAD ARMBOARD 7.5X6 YLW CONV (MISCELLANEOUS) ×2 IMPLANT
SLING ARM FOAM STRAP LRG (SOFTGOODS) IMPLANT
SLING ARM FOAM STRAP MED (SOFTGOODS) IMPLANT
SPIKE FLUID TRANSFER (MISCELLANEOUS) ×1 IMPLANT
SUT MNCRL AB 4-0 PS2 18 (SUTURE) ×1 IMPLANT
SUT PROLENE 6 0 BV (SUTURE) ×1 IMPLANT
SUT PROLENE 7 0 BV 1 (SUTURE) IMPLANT
SUT SILK 2 0 SH (SUTURE) IMPLANT
SUT SILK 2 0 SH CR/8 (SUTURE) ×2 IMPLANT
SUT VIC AB 2-0 CT1 27 (SUTURE) ×1
SUT VIC AB 2-0 CT1 TAPERPNT 27 (SUTURE) ×1 IMPLANT
SUT VIC AB 3-0 SH 27 (SUTURE) ×4
SUT VIC AB 3-0 SH 27X BRD (SUTURE) ×2 IMPLANT
TOWEL GREEN STERILE (TOWEL DISPOSABLE) ×1 IMPLANT
UNDERPAD 30X36 HEAVY ABSORB (UNDERPADS AND DIAPERS) ×1 IMPLANT
WATER STERILE IRR 1000ML POUR (IV SOLUTION) ×1 IMPLANT

## 2022-11-13 NOTE — H&P (Signed)
POST OPERATIVE OFFICE NOTE  Patient seen and examined in preop holding.  No complaints. No changes to medication history or physical exam since last seen in clinic. After discussing the risks and benefits of left arm fistula superficalization , Ana [name] elected to proceed.   Victorino Sparrow MD   CC:  F/u for surgery  HPI:  Ana [name] is a 52 y.o. female who is s/p right IJ [facility] placement and left brachiobasilic fistula creation on 09/05/2022 by Dr. Karin Lieu.  This was done for permanent dialysis access placement.  Pt returns today for follow up.  Pt states she has felt fine since surgery.  She still has some soreness around the catheter site, especially during dialysis but this is improving.  Her left arm incision was sore for about a week after surgery but this is also improved.  She denies any issues such as drainage from the incision, erythema, or swelling.  She denies any symptoms of steal such as excessive coldness, left hand pain, or weakness.  She dialyzes via right IJ TDC on Tuesdays, Thursdays, and Saturdays.   Allergies  Allergen Reactions   Ranitidine Hcl Rash    Current Facility-Administered Medications  Medication Dose Route Frequency Provider Last Rate Last Admin   0.9 %  sodium chloride infusion   Intravenous Continuous Victorino Sparrow, MD       0.9 %  sodium chloride infusion   Intravenous Continuous Val Eagle, MD 10 mL/hr at 11/13/22 0700 New Bag at 11/13/22 0700   ceFAZolin (ANCEF) IVPB 2g/100 mL premix  2 g Intravenous 30 min Pre-Op Victorino Sparrow, MD       chlorhexidine (HIBICLENS) 4 % liquid 4 Application  60 mL Topical Once Victorino Sparrow, MD       And   chlorhexidine (HIBICLENS) 4 % liquid 4 Application  60 mL Topical Once Victorino Sparrow, MD         ROS:  See HPI  Physical Exam:  Incision: Left AC fossa incision well-healed without signs of infection or hematoma Extremities: Left brachiobasilic fistula with great thrill on palpation  throughout the upper arm.  2+ palpable left radial pulse Neuro: Intact motor and sensation of left upper extremity  Studies: Dialysis Duplex (10/13/2022) +--------------------+----------+-----------------+--------+  AVF                PSV (cm/s)Flow Vol (mL/min)Comments  +--------------------+----------+-----------------+--------+  Native artery inflow   395           765                 +--------------------+----------+-----------------+--------+  AVF Anastomosis        893                               +--------------------+----------+-----------------+--------+     +------------+----------+-------------+----------+--------+  OUTFLOW VEINPSV (cm/s)Diameter (cm)Depth (cm)Describe  +------------+----------+-------------+----------+--------+  Prox UA        104        0.86                         +------------+----------+-------------+----------+--------+  Mid UA         323        0.74                         +------------+----------+-------------+----------+--------+  Dist UA  497        0.44                            Assessment/Plan:  This is a 52 y.o. female who is here for postop visit  -She is s/p right IJ TDC placement and left brachiobasilic fistula creation on 09/05/2022 -Her left arm incision is well-healed without signs of infection such as erythema or drainage -On exam her left brachiobasilic fistula has a great thrill throughout the upper arm -She denies any symptoms of steal such as left hand pain, excessive coldness, or weakness.  She has a palpable left radial pulse -Duplex demonstrates a patent basilic fistula with decent flow volume of 765 ml/min.  Most of the fistula is well matured in size and greater than 6 mm in diameter.  There are high velocities at the anastomosis site of 893 cm/s.  We can address this area of stenosis during her second surgery. -We will schedule the patient for left arm second stage basilic fistula  surgery in the next 2 to 3 weeks on a Monday, Wednesday, or Friday.  This will be done with Dr. Karin Lieu.

## 2022-11-13 NOTE — Transfer of Care (Signed)
Immediate Anesthesia Transfer of Care Note  [name]  Procedure(s) Performed: LEFT ARM SECOND STAGE BASILIC VEIN TRANSPOSITION (Left: Arm Upper)  Patient Location: PACU  Anesthesia Type:General  Level of Consciousness: awake, alert , and oriented  Airway & Oxygen Therapy: Patient Spontanous Breathing  Post-op Assessment: Report given to RN  Post vital signs: Reviewed and stable  Last Vitals:  Vitals Value Taken Time  BP 135/74 11/13/22 0936  Temp    Pulse 73 11/13/22 0939  Resp 15 11/13/22 0939  SpO2 99 % 11/13/22 0939  Vitals shown include unfiled device data.  Last Pain:  Vitals:   11/13/22 0641  TempSrc:   PainSc: 0-No pain         Complications: No notable events documented.

## 2022-11-13 NOTE — Discharge Instructions (Signed)
[facility]  Discharge Instructions  AV Fistula or Graft Surgery for Dialysis Access  Please refer to the following instructions for your post-procedure care. Your surgeon or physician assistant will discuss any changes with you.  Activity  You may drive the day following your surgery, if you are comfortable and no longer taking prescription pain medication. Resume full activity as the soreness in your incision resolves.  Bathing/Showering  You may shower after you go home. Keep your incision dry for 48 hours. Do not soak in a bathtub, hot tub, or swim until the incision heals completely. You may not shower if you have a hemodialysis catheter.  Incision Care  Clean your incision with mild soap and water after 48 hours. Pat the area dry with a clean towel. You do not need a bandage unless otherwise instructed. Do not apply any ointments or creams to your incision. You may have skin glue on your incision. Do not peel it off. It will come off on its own in about one week. Your arm may swell a bit after surgery. To reduce swelling use pillows to elevate your arm so it is above your heart. Your doctor will tell you if you need to lightly wrap your arm with an ACE bandage.  Diet  Resume your normal diet. There are not special food restrictions following this procedure. In order to heal from your surgery, it is CRITICAL to get adequate nutrition. Your body requires vitamins, minerals, and protein. Vegetables are the best source of vitamins and minerals. Vegetables also provide the perfect balance of protein. Processed food has little nutritional value, so try to avoid this.  Medications  Resume taking all of your medications. If your incision is causing pain, you may take over-the counter pain relievers such as acetaminophen (Tylenol). If you were prescribed a stronger pain medication, please be aware these medications can cause nausea and constipation. Prevent  nausea by taking the medication with a snack or meal. Avoid constipation by drinking plenty of fluids and eating foods with high amount of fiber, such as fruits, vegetables, and grains.  Do not take Tylenol if you are taking prescription pain medications.  Follow up Your surgeon may want to see you in the office following your access surgery. If so, this will be arranged at the time of your surgery.  Please call us immediately for any of the following conditions:  Increased pain, redness, drainage (pus) from your incision site Fever of 101 degrees or higher Severe or worsening pain at your incision site Hand pain or numbness.  Reduce your risk of vascular disease:  Stop smoking. If you would like help, call [facility] at 1-800-QUIT-NOW (330 744 3520) or [place] at 417-512-9935  Manage your cholesterol Maintain a desired weight Control your diabetes Keep your blood pressure down  Dialysis  It will take several weeks to several months for your new dialysis access to be ready for use. Your surgeon will determine when it is okay to use it. Your nephrologist will continue to direct your dialysis. You can continue to use your Permcath until your new access is ready for use.   11/13/2022 Ana [name] [id number] 1970-12-29  Surgeon(s): Victorino Sparrow, MD  Procedure(s): LEFT ARM SECOND STAGE BASILIC VEIN TRANSPOSITION  x Do not stick fistula for 6 weeks    If you have any questions, please call the office at [phone].

## 2022-11-13 NOTE — Op Note (Signed)
NAME: Ana [name]    MRN: [id number] DOB: [birth year]    DATE OF OPERATION: 11/13/2022  PREOP DIAGNOSIS:    End stage renal disease  POSTOP DIAGNOSIS:    Same  PROCEDURE:    Left arm brachiobasilic transposition   SURGEON: Victorino Sparrow  ASSIST: Doreatha Massed, PA  ANESTHESIA: General   EBL: 30ml  INDICATIONS:    [name] is a 52 y.o. female with end stage renal disease s/p first stage brachiobasilic fistula transposition in need of transposition.  FINDINGS:    Left brachiobasilic fistula 8mm  TECHNIQUE:   Patient was brought to the OR and laid in supine position.  Moderate anesthesia was induced. The patient was prepped and draped in standard fashion.  Lidocaine was brought to the field and a local block was performed.   The case began with left arm ultrasound fistula mapping.  Multiple branches were noted and marked.  Three longitudinal skip incisions were made along the course of the basilic vein with 5 cm bridges.  This was carried through the subcutaneous fat to the brachiobasilic fistula.  The fistula was mobilized and multiple branches ligated using 2-0 silk and clips.  Once mobilized, a gore tunneler was brought on to the field, and a subcutaneous tunnel was made along the medial aspect of the bicep. Next, the patient as heparinized, fistula marked, clamped and transected. The vein was pulled through the tunnel tract and reanastomosed using 6.0 prolene suture in running fashion in the axilla. The wound bed was irrigated with saline, hemostasis achieved with suture and cautery. The wounds were closed with layers of vicryl suture with monocryl and dermabond at the skin. There was a palpable thrill in the fistula at case completion with excellent pulse in the wrist.     Given the complexity of the case,  the assistant was necessary in order to expedient the procedure and safely perform the technical aspects of the operation.  The assistant provided traction and  countertraction to assist with exposure of the artery and vein.  They also assisted with suture ligation of multiple venous branches.  They played a critical role in the anastomosis. These skills, especially following the Prolene suture for the anastomosis, could not have been adequately performed by a scrub tech assistant.   Victorino Sparrow, MD [facility] DATE OF DICTATION:   11/13/2022

## 2022-11-14 ENCOUNTER — Encounter (HOSPITAL_COMMUNITY): Payer: Self-pay | Admitting: Vascular Surgery

## 2022-11-14 DIAGNOSIS — E1165 Type 2 diabetes mellitus with hyperglycemia: Secondary | ICD-10-CM | POA: Diagnosis not present

## 2022-11-14 NOTE — Anesthesia Postprocedure Evaluation (Signed)
Anesthesia Post Note  [name]  Procedure(s) Performed: LEFT ARM SECOND STAGE BASILIC VEIN TRANSPOSITION (Left: Arm Upper)     Patient location during evaluation: PACU Anesthesia Type: General Level of consciousness: awake and alert Pain management: pain level controlled Vital Signs Assessment: post-procedure vital signs reviewed and stable Respiratory status: spontaneous breathing, nonlabored ventilation, respiratory function stable and patient connected to nasal cannula oxygen Cardiovascular status: blood pressure returned to baseline and stable Postop Assessment: no apparent nausea or vomiting Anesthetic complications: no   No notable events documented.  Last Vitals:  Vitals:   11/13/22 0937 11/13/22 0945  BP:  129/64  Pulse:  70  Resp:  17  Temp: 36.6 C   SpO2:  97%    Last Pain:  Vitals:   11/13/22 1015  TempSrc:   PainSc: 0-No pain                 [place]

## 2022-11-15 DIAGNOSIS — E1165 Type 2 diabetes mellitus with hyperglycemia: Secondary | ICD-10-CM | POA: Diagnosis not present

## 2022-11-16 DIAGNOSIS — N2581 Secondary hyperparathyroidism of renal origin: Secondary | ICD-10-CM | POA: Diagnosis not present

## 2022-11-16 DIAGNOSIS — Z992 Dependence on renal dialysis: Secondary | ICD-10-CM | POA: Diagnosis not present

## 2022-11-16 DIAGNOSIS — E1165 Type 2 diabetes mellitus with hyperglycemia: Secondary | ICD-10-CM | POA: Diagnosis not present

## 2022-11-16 DIAGNOSIS — D689 Coagulation defect, unspecified: Secondary | ICD-10-CM | POA: Diagnosis not present

## 2022-11-16 DIAGNOSIS — D509 Iron deficiency anemia, unspecified: Secondary | ICD-10-CM | POA: Diagnosis not present

## 2022-11-16 DIAGNOSIS — Z23 Encounter for immunization: Secondary | ICD-10-CM | POA: Diagnosis not present

## 2022-11-16 DIAGNOSIS — E1122 Type 2 diabetes mellitus with diabetic chronic kidney disease: Secondary | ICD-10-CM | POA: Diagnosis not present

## 2022-11-16 DIAGNOSIS — D631 Anemia in chronic kidney disease: Secondary | ICD-10-CM | POA: Diagnosis not present

## 2022-11-16 DIAGNOSIS — N186 End stage renal disease: Secondary | ICD-10-CM | POA: Diagnosis not present

## 2022-11-18 DIAGNOSIS — N186 End stage renal disease: Secondary | ICD-10-CM | POA: Diagnosis not present

## 2022-11-18 DIAGNOSIS — E1122 Type 2 diabetes mellitus with diabetic chronic kidney disease: Secondary | ICD-10-CM | POA: Diagnosis not present

## 2022-11-18 DIAGNOSIS — D509 Iron deficiency anemia, unspecified: Secondary | ICD-10-CM | POA: Diagnosis not present

## 2022-11-18 DIAGNOSIS — D689 Coagulation defect, unspecified: Secondary | ICD-10-CM | POA: Diagnosis not present

## 2022-11-18 DIAGNOSIS — Z992 Dependence on renal dialysis: Secondary | ICD-10-CM | POA: Diagnosis not present

## 2022-11-18 DIAGNOSIS — Z23 Encounter for immunization: Secondary | ICD-10-CM | POA: Diagnosis not present

## 2022-11-18 DIAGNOSIS — N2581 Secondary hyperparathyroidism of renal origin: Secondary | ICD-10-CM | POA: Diagnosis not present

## 2022-11-18 DIAGNOSIS — D631 Anemia in chronic kidney disease: Secondary | ICD-10-CM | POA: Diagnosis not present

## 2022-11-20 ENCOUNTER — Telehealth: Payer: Self-pay | Admitting: *Deleted

## 2022-11-20 NOTE — Patient Outreach (Signed)
Care Management   Note  11/20/2022 Name: Ana [name] MRN: [id number] DOB: 1970-09-02  [name] is enrolled in a Managed Medicaid plan: Yes. Outreach attempt today was successful.   The patient was given information about care management services as a benefit of their Medicaid health plan today.    Patient                                              agreed to services and verbal consent obtained.    An initial telephone outreach has been scheduled for: 11/24/22 at 2:30pm  [name] RN, BSN [place]  Value-Based Care Institute [facility] RN Care Coordinator [phone]

## 2022-11-21 DIAGNOSIS — N2581 Secondary hyperparathyroidism of renal origin: Secondary | ICD-10-CM | POA: Diagnosis not present

## 2022-11-21 DIAGNOSIS — D509 Iron deficiency anemia, unspecified: Secondary | ICD-10-CM | POA: Diagnosis not present

## 2022-11-21 DIAGNOSIS — D689 Coagulation defect, unspecified: Secondary | ICD-10-CM | POA: Diagnosis not present

## 2022-11-21 DIAGNOSIS — N186 End stage renal disease: Secondary | ICD-10-CM | POA: Diagnosis not present

## 2022-11-21 DIAGNOSIS — Z992 Dependence on renal dialysis: Secondary | ICD-10-CM | POA: Diagnosis not present

## 2022-11-23 DIAGNOSIS — N2581 Secondary hyperparathyroidism of renal origin: Secondary | ICD-10-CM | POA: Diagnosis not present

## 2022-11-23 DIAGNOSIS — D689 Coagulation defect, unspecified: Secondary | ICD-10-CM | POA: Diagnosis not present

## 2022-11-23 DIAGNOSIS — Z992 Dependence on renal dialysis: Secondary | ICD-10-CM | POA: Diagnosis not present

## 2022-11-23 DIAGNOSIS — D509 Iron deficiency anemia, unspecified: Secondary | ICD-10-CM | POA: Diagnosis not present

## 2022-11-23 DIAGNOSIS — N186 End stage renal disease: Secondary | ICD-10-CM | POA: Diagnosis not present

## 2022-11-24 ENCOUNTER — Other Ambulatory Visit: Payer: Medicaid Other | Admitting: *Deleted

## 2022-11-24 NOTE — Patient Outreach (Signed)
Medicaid Managed Care   Nurse Care Manager Note  11/24/2022 Name:  Ana [name] MRN:  [id number] DOB:  11-30-1970  Ana [name] is an 52 y.o. year old female who is a primary patient of [name], DO.  The [facility] Managed Care Coordination team was consulted for assistance with:    DMII  Ana [name] was given information about Medicaid Managed Care Coordination team services today. Ana [name] Patient agreed to services and verbal consent obtained.  Engaged with patient by telephone for initial visit in response to provider referral for case management and/or care coordination services.   Assessments/Interventions:  Review of past medical history, allergies, medications, health status, including review of consultants reports, laboratory and other test data, was performed as part of comprehensive evaluation and provision of chronic care management services.  SDOH (Social Determinants of Health) assessments and interventions performed: SDOH Interventions    Flowsheet Row Patient Outreach Telephone from 11/24/2022 in [place] [facility]PART[facility] ED to Hosp-Admission (Discharged) from 12/21/2021 in [place] [facility]  SDOH Interventions    Food Insecurity Interventions Other (Comment)  [BSW referral] --  Housing Interventions Intervention Not Indicated Intervention Not Indicated  Transportation Interventions Intervention Not Indicated --  Utilities Interventions Intervention Not Indicated --       Care Plan  Allergies  Allergen Reactions   Ranitidine Hcl Rash    Medications Reviewed Today     Reviewed by [name], RN (Registered Nurse) on 11/24/22 at 1448  Med List Status: <None>   Medication Order Taking? Sig Documenting Provider Last Dose Status Informant  amLODipine (NORVASC) 10 MG tablet [id number] Yes TAKE 1 TABLET BY MOUTH DAILY  Patient taking differently: Take 10 mg by mouth daily.   Erick Blinks, MD Taking Active Child  Blood  Glucose Monitoring Suppl DEVI [id number] Yes Use up to 4 times daily to check blood sugar. May substitute to any manufacturer covered by patient's insurance. [name], DO Taking Active   calcitRIOL (ROCALTROL) 0.25 MCG capsule [id number] Yes Take 0.25 mcg by mouth daily. [provider] Taking Active Child  carvedilol (COREG) 25 MG tablet [id number] Yes Take 1 tablet (25 mg total) by mouth 2 (two) times daily. Ellsworth Lennox, PA-C Taking Active Child  Continuous Glucose Sensor (FREESTYLE LIBRE 3 SENSOR) [place] [id number] No Place 1 sensor on the skin every 14 days. Use to check glucose continuously  Patient not taking: Reported on 11/24/2022   [name], DO Not Taking Active   furosemide (LASIX) 40 MG tablet [id number] Yes Take 80 mg by mouth 2 (two) times daily. [provider] Taking Active Child  Glucose Blood (BLOOD GLUCOSE TEST STRIPS) STRP [id number] Yes Use up to 4 times daily to check blood sugar. May substitute to any manufacturer covered by patient's insurance. [name], DO Taking Active   hydrALAZINE (APRESOLINE) 100 MG tablet [id number] Yes Take 1 tablet (100 mg total) by mouth 3 (three) times daily. Ellsworth Lennox, PA-C Taking Active Child  insulin glargine (LANTUS) 100 UNIT/ML Solostar Pen [id number] Yes Inject 10 Units into the skin daily. [name], DO Taking Active Child, Self  Insulin Pen Needle 31G X 4 MM MISC [id number] Yes Use to administer insulin daily. [name], DO Taking Active Child  Lancet Device MISC [id number] Yes Use up to 4 times daily to check blood sugar. May substitute to any manufacturer covered by patient's insurance. [name], DO Taking Active  Lancets Misc. MISC [id number] Yes Use up to 4 times daily to check blood sugar. May substitute to any manufacturer covered by patient's insurance. [name], DO Taking Active   levothyroxine (SYNTHROID) 50 MCG tablet [id number] Yes TAKE 1 TABLET(50 MCG) BY MOUTH EVERY DAY [name], DO Taking Active Child  oxyCODONE-acetaminophen (PERCOCET) 5-325 MG tablet [id number] No Take 1 tablet by mouth every 6 (six) hours as needed.  Patient not taking: Reported on 11/24/2022   Dara Lords, PA-C Not Taking Active   rosuvastatin (CRESTOR) 40 MG tablet [id number] Yes Take 1 tablet (40 mg total) by mouth daily with supper. Erick Blinks, MD Taking Active Child  sodium bicarbonate 650 MG tablet [id number] Yes Take 650 mg by mouth 2 (two) times daily. 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) 12/21/2021   Hypothyroidism, adult 09/02/2020   Vitamin D deficiency disease 09/02/2020   Type 2 diabetes mellitus with hyperlipidemia (HCC) 09/26/2017   Essential hypertension, benign 09/26/2017   Mixed hyperlipidemia 09/26/2017    Conditions to be addressed/monitored per PCP order:  DMII  Care Plan : RN Care Manager Plan of Care  Updates made by [name], RN since 11/24/2022 12:00 AM     Problem: Healthcare needs related to DMII      Long-Range Goal: Development of Plan of Care to address Healthcare needs related to DMII   Start Date: 11/24/2022  Expected End Date: 02/22/2023  Note:   Current Barriers:  Chronic Disease Management support and education needs related to DMII   RNCM Clinical Goal(s):  Patient will take all medications exactly as prescribed and will call provider for medication related questions as evidenced by patient reports attend all scheduled medical appointments: 10/3 for Post Op with [facility], 10/7 at [facility] for Kidney Education, 10/8 with Dr. Fransico Him, 10/9 with Dr. Adriana Simas and 12/27 with Cardiology as evidenced by provider documentation in EMR continue  to work with RN Care Manager to address care management and care coordination needs related to  DMII as evidenced by adherence to CM Team Scheduled appointments work with social worker to address  related to the management of Limited access to food related to the management of DMII as evidenced by review of EMR and patient or Child psychotherapist report through collaboration with Medical illustrator, provider, and care team.   Interventions: Evaluation of current treatment plan related to  self management and patient's adherence to plan as established by provider   Diabetes Interventions:  (Status:  New goal.) Long Term Goal Assessed patient's understanding of A1c goal: <7% Provided education to patient about basic DM disease process Reviewed medications with patient and discussed importance of medication adherence Counseled on importance of regular laboratory monitoring as prescribed Discussed plans with patient for ongoing care management follow up and provided patient with direct contact information for care management team Reviewed scheduled/upcoming provider appointments including: 10/3 for Post Op with [facility], 10/7 at [facility] for Kidney Education, 10/8 with Dr. [name](Endocrinology), 10/9 with Dr. [name]) and 12/27 Cardiology Referral made to social work team for assistance with local food pantries Review of patient status, including review of consultants reports,  relevant laboratory and other test results, and medications completed Assessed social determinant of health barriers Advised patient to schedule and eye exam with Ophthalmology Advised patient to follow up with pharmacy for CGM Lab Results  Component Value Date   HGBA1C 10.9 (H) 08/04/2022    Patient Goals/Self-Care Activities: Take all medications as prescribed Attend all scheduled provider appointments Call provider office for new concerns or questions  Work with the social worker to address care coordination needs and will continue to  work with the clinical team to address health care and disease management related needs schedule appointment with eye doctor drink 6 to 8 glasses of water each day fill half of plate with vegetables limit fast food meals to no more than 1 per week manage portion size  Follow Up Plan:  Telephone follow up appointment with care management team member scheduled for:  12/08/22 at 2:30pm      Follow Up:  Patient agrees to Care Plan and Follow-up.  Plan: The Managed Medicaid care management team will reach out to the patient again over the next 14 days.  Date/time of next scheduled RN care management/care coordination outreach:  12/08/22 at 2:30pm  [name] RN, BSN [place]  Value-Based Care Institute [facility] RN Care Coordinator [phone]

## 2022-11-24 NOTE — Patient Instructions (Signed)
Visit Information  Ana [name] was given information about Medicaid Managed Care team care coordination services as a part of their Healthy [facility] Medicaid benefit. Ana [name] verbally consented to engagement with the [facility] Managed Care team.   If you are experiencing a medical emergency, please call 911 or report to your local emergency department or urgent care.   If you have a non-emergency medical problem during routine business hours, please contact your provider's office and ask to speak with a nurse.   For questions related to your Healthy [facility] health plan, please call: [phone] or visit the homepage here: [email or web address]  If you would like to schedule transportation through your Healthy [facility] plan, please call the following number at least 2 days in advance of your appointment: [phone]  For information about your ride after you set it up, call Ride Assist at [phone]. Use this number to activate a Will Call pickup, or if your transportation is late for a scheduled pickup. Use this number, too, if you need to make a change or cancel a previously scheduled reservation.  If you need transportation services right away, call [phone]. The after-hours call center is staffed 24 hours to handle ride assistance and urgent reservation requests (including discharges) 365 days a year. Urgent trips include sick visits, hospital discharge requests and life-sustaining treatment.  Call the [facility] Line at [phone], at any time, 24 hours a day, 7 days a week. If you are in danger or need immediate medical attention call 911.  If you would like help to quit smoking, call 1-800-QUIT-NOW (610-148-2542) OR Espaol: 1-855-Djelo-Ya (3-557-322-0254) o para ms informacin haga clic aqu or Text READY to 270-623 to register via text  [name],   Please see education materials related to diabetes provided as  print materials.   The patient verbalized understanding of instructions, educational materials, and care plan provided today and agreed to receive a mailed copy of patient instructions, educational materials, and care plan.   Telephone follow up appointment with Managed Medicaid care management team member scheduled for:12/08/22 at 2:30pm  Ana Emms RN, BSN [place]  Value-Based Care Institute [facility] Health RN Care Coordinator [phone]   Following is a copy of your plan of care:  Care Plan : RN Care Manager Plan of Care  Updates made by [name], RN since 11/24/2022 12:00 AM     Problem: Healthcare needs related to DMII      Long-Range Goal: Development of Plan of Care to address Healthcare needs related to DMII   Start Date: 11/24/2022  Expected End Date: 02/22/2023  Note:   Current Barriers:  Chronic Disease Management support and education needs related to DMII   RNCM Clinical Goal(s):  Patient will take all medications exactly as prescribed and will call provider for medication related questions as evidenced by patient reports attend all scheduled medical appointments: 10/3 for Post Op with [facility], 10/7 at [facility] for Kidney Education, 10/8 with Dr. Fransico Him, 10/9 with Dr. Adriana Simas and 12/27 with Cardiology as evidenced by provider documentation in EMR continue to work with RN Care Manager to address care management and care coordination needs related to  DMII as evidenced by adherence to CM Team Scheduled appointments work with social worker to address  related to the management of Limited access to food related to the management of DMII as evidenced by review of EMR and patient or social worker report through collaboration with Medical illustrator, provider, and care team.  Interventions: Evaluation of current treatment plan related to  self management and patient's adherence to plan as established by provider   Diabetes Interventions:  (Status:  New goal.) Long Term  Goal Assessed patient's understanding of A1c goal: <7% Provided education to patient about basic DM disease process Reviewed medications with patient and discussed importance of medication adherence Counseled on importance of regular laboratory monitoring as prescribed Discussed plans with patient for ongoing care management follow up and provided patient with direct contact information for care management team Reviewed scheduled/upcoming provider appointments including: 10/3 for Post Op with [facility], 10/7 at [facility] for Kidney Education, 10/8 with Dr. [name](Endocrinology), 10/9 with Dr. [name]) and 12/27 Cardiology Referral made to social work team for assistance with local food pantries Review of patient status, including review of consultants reports, relevant laboratory and other test results, and medications completed Assessed social determinant of health barriers Advised patient to schedule and eye exam with Ophthalmology Advised patient to follow up with pharmacy for CGM Lab Results  Component Value Date   HGBA1C 10.9 (H) 08/04/2022    Patient Goals/Self-Care Activities: Take all medications as prescribed Attend all scheduled provider appointments Call provider office for new concerns or questions  Work with the social worker to address care coordination needs and will continue to work with the clinical team to address health care and disease management related needs schedule appointment with eye doctor drink 6 to 8 glasses of water each day fill half of plate with vegetables limit fast food meals to no more than 1 per week manage portion size  Follow Up Plan:  Telephone follow up appointment with care management team member scheduled for:  12/08/22 at 2:30pm

## 2022-11-25 DIAGNOSIS — D689 Coagulation defect, unspecified: Secondary | ICD-10-CM | POA: Diagnosis not present

## 2022-11-25 DIAGNOSIS — D509 Iron deficiency anemia, unspecified: Secondary | ICD-10-CM | POA: Diagnosis not present

## 2022-11-25 DIAGNOSIS — Z992 Dependence on renal dialysis: Secondary | ICD-10-CM | POA: Diagnosis not present

## 2022-11-25 DIAGNOSIS — N186 End stage renal disease: Secondary | ICD-10-CM | POA: Diagnosis not present

## 2022-11-25 DIAGNOSIS — N2581 Secondary hyperparathyroidism of renal origin: Secondary | ICD-10-CM | POA: Diagnosis not present

## 2022-11-28 DIAGNOSIS — D689 Coagulation defect, unspecified: Secondary | ICD-10-CM | POA: Diagnosis not present

## 2022-11-28 DIAGNOSIS — N2581 Secondary hyperparathyroidism of renal origin: Secondary | ICD-10-CM | POA: Diagnosis not present

## 2022-11-28 DIAGNOSIS — D631 Anemia in chronic kidney disease: Secondary | ICD-10-CM | POA: Diagnosis not present

## 2022-11-28 DIAGNOSIS — D509 Iron deficiency anemia, unspecified: Secondary | ICD-10-CM | POA: Diagnosis not present

## 2022-11-28 DIAGNOSIS — Z992 Dependence on renal dialysis: Secondary | ICD-10-CM | POA: Diagnosis not present

## 2022-11-28 DIAGNOSIS — N186 End stage renal disease: Secondary | ICD-10-CM | POA: Diagnosis not present

## 2022-11-29 ENCOUNTER — Other Ambulatory Visit: Payer: Medicaid Other

## 2022-11-29 NOTE — Patient Instructions (Signed)
Visit Information  Ms. Holton was given information about Medicaid Managed Care team care coordination services as a part of their Healthy [facility] Medicaid benefit. Madaline Lefeber Casselman verbally consented to engagement with the [facility] Managed Care team.   If you are experiencing a medical emergency, please call 911 or report to your local emergency department or urgent care.   If you have a non-emergency medical problem during routine business hours, please contact your provider's office and ask to speak with a nurse.   For questions related to your Healthy [facility] health plan, please call: [phone] or visit the homepage here: [email or web address]  If you would like to schedule transportation through your Healthy [facility] plan, please call the following number at least 2 days in advance of your appointment: [phone]  For information about your ride after you set it up, call Ride Assist at [phone]. Use this number to activate a Will Call pickup, or if your transportation is late for a scheduled pickup. Use this number, too, if you need to make a change or cancel a previously scheduled reservation.  If you need transportation services right away, call [phone]. The after-hours call center is staffed 24 hours to handle ride assistance and urgent reservation requests (including discharges) 365 days a year. Urgent trips include sick visits, hospital discharge requests and life-sustaining treatment.  Call the [facility] Line at [phone], at any time, 24 hours a day, 7 days a week. If you are in danger or need immediate medical attention call 911.  If you would like help to quit smoking, call 1-800-QUIT-NOW ((304)513-2904) OR Espaol: 1-855-Djelo-Ya (3-557-322-0254) o para ms informacin haga clic aqu or Text READY to 270-623 to register via text  Ana [name] - following are the goals we discussed in your visit today:   Goals  Addressed   None      Social Worker will follow up in 30 days.   Gus Puma, Kenard Gower, MHA [facility]  Managed Medicaid Social Worker [phone]   Following is a copy of your plan of care:  Care Plan : RN Care Manager Plan of Care  Updates made by Shaune Leeks since 11/29/2022 12:00 AM     Problem: Healthcare needs related to DMII      Long-Range Goal: Development of Plan of Care to address Healthcare needs related to DMII   Start Date: 11/24/2022  Expected End Date: 02/22/2023  Note:   Current Barriers:  Chronic Disease Management support and education needs related to DMII   RNCM Clinical Goal(s):  Patient will take all medications exactly as prescribed and will call provider for medication related questions as evidenced by patient reports attend all scheduled medical appointments: 10/3 for Post Op with [facility], 10/7 at [facility] for Kidney Education, 10/8 with Dr. Fransico Him, 10/9 with Dr. Adriana Simas and 12/27 with Cardiology as evidenced by provider documentation in EMR continue to work with RN Care Manager to address care management and care coordination needs related to  DMII as evidenced by adherence to CM Team Scheduled appointments work with social worker to address  related to the management of Limited access to food related to the management of DMII as evidenced by review of EMR and patient or social worker report through collaboration with Medical illustrator, provider, and care team.   Interventions: Evaluation of current treatment plan related to  self management and patient's adherence to plan as established by provider BSW completed a telephone outreach with patient, she states she does  receive foodstamps but they are not enough. Patient states she receives Ssi and does need assistance with utilities and rent. BSW and patient agreed for resources to be mailed to her.  Diabetes Interventions:  (Status:  New goal.) Long Term Goal Assessed patient's understanding of A1c goal:  <7% Provided education to patient about basic DM disease process Reviewed medications with patient and discussed importance of medication adherence Counseled on importance of regular laboratory monitoring as prescribed Discussed plans with patient for ongoing care management follow up and provided patient with direct contact information for care management team Reviewed scheduled/upcoming provider appointments including: 10/3 for Post Op with [facility], 10/7 at [facility] for Kidney Education, 10/8 with Dr. [name](Endocrinology), 10/9 with Dr. [name]) and 12/27 Cardiology Referral made to social work team for assistance with local food pantries Review of patient status, including review of consultants reports, relevant laboratory and other test results, and medications completed Assessed social determinant of health barriers Advised patient to schedule and eye exam with Ophthalmology Advised patient to follow up with pharmacy for CGM Lab Results  Component Value Date   HGBA1C 10.9 (H) 08/04/2022    Patient Goals/Self-Care Activities: Take all medications as prescribed Attend all scheduled provider appointments Call provider office for new concerns or questions  Work with the social worker to address care coordination needs and will continue to work with the clinical team to address health care and disease management related needs schedule appointment with eye doctor drink 6 to 8 glasses of water each day fill half of plate with vegetables limit fast food meals to no more than 1 per week manage portion size  Follow Up Plan:  Telephone follow up appointment with care management team member scheduled for:  12/08/22 at 2:30pm

## 2022-11-29 NOTE — Patient Outreach (Signed)
Medicaid Managed Care Social Work Note  11/29/2022 Name:  Ana [name] MRN:  [id number] DOB:  August 03, 1970  Ana [name] is an 52 y.o. year old female who is a primary patient of Ana Sams, DO.  The Medicaid Managed Care Coordination team was consulted for assistance with:  Food [employer] Resources   Ana [name] was given information about Medicaid Managed Care Coordination team services today. Ana [name] Patient agreed to services and verbal consent obtained.  Engaged with patient  for by telephone forinitial visit in response to referral for case management and/or care coordination services.   Assessments/Interventions:  Review of past medical history, allergies, medications, health status, including review of consultants reports, laboratory and other test data, was performed as part of comprehensive evaluation and provision of chronic care management services.  SDOH: (Social Determinant of Health) assessments and interventions performed: SDOH Interventions    Flowsheet Row Patient Outreach Telephone from 11/24/2022 in [place] POPULATION HEALTH DEPARTMENT ED to Hosp-Admission (Discharged) from 12/21/2021 in [place] MEDICAL SURGICAL UNIT  SDOH Interventions    Food Insecurity Interventions Other (Comment)  [BSW referral] --  Housing Interventions Intervention Not Indicated Intervention Not Indicated  Transportation Interventions Intervention Not Indicated --  Utilities Interventions Intervention Not Indicated --     BSW completed a telephone outreach with patient, she states she does receive foodstamps but they are not enough. Patient states she receives Ssi and does need assistance with utilities and rent. BSW and patient agreed for resources to be mailed to her.  Advanced Directives Status:  Not addressed in this encounter.  Care Plan                 Allergies  Allergen Reactions   Ranitidine Hcl Rash    Medications Reviewed Today   Medications were  not reviewed in this encounter     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) 12/21/2021   Hypothyroidism, adult 09/02/2020   Vitamin D deficiency disease 09/02/2020   Type 2 diabetes mellitus with hyperlipidemia (HCC) 09/26/2017   Essential hypertension, benign 09/26/2017   Mixed hyperlipidemia 09/26/2017    Conditions to be addressed/monitored per PCP order:   community resources  There are no care plans that you recently modified to display for this patient.   Follow up:  Patient agrees to Care Plan and Follow-up.  Plan: The Managed Medicaid care management team will reach out to the patient again over the next 30 days.  Date/time of next scheduled Social Work care management/care coordination outreach:  01/01/23  Ana [name], Ana [name], [name]  Managed [name] [place] Social Worker [phone]

## 2022-11-30 DIAGNOSIS — N186 End stage renal disease: Secondary | ICD-10-CM | POA: Diagnosis not present

## 2022-11-30 DIAGNOSIS — Z992 Dependence on renal dialysis: Secondary | ICD-10-CM | POA: Diagnosis not present

## 2022-11-30 DIAGNOSIS — D689 Coagulation defect, unspecified: Secondary | ICD-10-CM | POA: Diagnosis not present

## 2022-11-30 DIAGNOSIS — D631 Anemia in chronic kidney disease: Secondary | ICD-10-CM | POA: Diagnosis not present

## 2022-11-30 DIAGNOSIS — D509 Iron deficiency anemia, unspecified: Secondary | ICD-10-CM | POA: Diagnosis not present

## 2022-11-30 DIAGNOSIS — N2581 Secondary hyperparathyroidism of renal origin: Secondary | ICD-10-CM | POA: Diagnosis not present

## 2022-12-01 ENCOUNTER — Ambulatory Visit (INDEPENDENT_AMBULATORY_CARE_PROVIDER_SITE_OTHER): Payer: Medicaid Other | Admitting: Physician Assistant

## 2022-12-01 ENCOUNTER — Other Ambulatory Visit: Payer: Self-pay | Admitting: Family Medicine

## 2022-12-01 VITALS — BP 135/79 | HR 75 | Temp 98.0°F | Resp 20 | Ht 60.0 in | Wt 186.9 lb

## 2022-12-01 DIAGNOSIS — Z1211 Encounter for screening for malignant neoplasm of colon: Secondary | ICD-10-CM

## 2022-12-01 DIAGNOSIS — Z1212 Encounter for screening for malignant neoplasm of rectum: Secondary | ICD-10-CM

## 2022-12-01 DIAGNOSIS — N186 End stage renal disease: Secondary | ICD-10-CM

## 2022-12-01 DIAGNOSIS — Z992 Dependence on renal dialysis: Secondary | ICD-10-CM

## 2022-12-01 NOTE — Progress Notes (Signed)
POST OPERATIVE OFFICE NOTE    CC:  F/u for surgery  HPI:  This is a 52 y.o. female who is s/p second stage basilic av fistula on 11/13/22 by Dr. Karin Lieu.  The fistula was created on 09/05/22.      Pt returns today for follow up.  Pt states she still has some mild discomfort.  She denies symptoms of steal such as weakness, pain in the hand or loss of sensation.  She has a working right internal jugular TDC and has HD TTS.     Allergies  Allergen Reactions   Ranitidine Hcl Rash    Current Outpatient Medications  Medication Sig Dispense Refill   amLODipine (NORVASC) 10 MG tablet TAKE 1 TABLET BY MOUTH DAILY (Patient taking differently: Take 10 mg by mouth daily.) 90 tablet 3   Blood Glucose Monitoring Suppl DEVI Use up to 4 times daily to check blood sugar. May substitute to any manufacturer covered by patient's insurance. 1 each 0   calcitRIOL (ROCALTROL) 0.25 MCG capsule Take 0.25 mcg by mouth daily.     carvedilol (COREG) 25 MG tablet Take 1 tablet (25 mg total) by mouth 2 (two) times daily. 180 tablet 3   Continuous Glucose Sensor (FREESTYLE LIBRE 3 SENSOR) MISC Place 1 sensor on the skin every 14 days. Use to check glucose continuously (Patient not taking: Reported on 11/24/2022) 2 each 6   furosemide (LASIX) 40 MG tablet Take 80 mg by mouth 2 (two) times daily.     Glucose Blood (BLOOD GLUCOSE TEST STRIPS) STRP Use up to 4 times daily to check blood sugar. May substitute to any manufacturer covered by patient's insurance. 100 strip 0   hydrALAZINE (APRESOLINE) 100 MG tablet Take 1 tablet (100 mg total) by mouth 3 (three) times daily. 270 tablet 3   insulin glargine (LANTUS) 100 UNIT/ML Solostar Pen Inject 10 Units into the skin daily. 15 mL 1   Insulin Pen Needle 31G X 4 MM MISC Use to administer insulin daily. 100 each 3   Lancet Device MISC Use up to 4 times daily to check blood sugar. May substitute to any manufacturer covered by patient's insurance. 1 each 0   Lancets Misc. MISC Use  up to 4 times daily to check blood sugar. May substitute to any manufacturer covered by patient's insurance. 100 each 0   levothyroxine (SYNTHROID) 50 MCG tablet TAKE 1 TABLET(50 MCG) BY MOUTH EVERY DAY 90 tablet 0   oxyCODONE-acetaminophen (PERCOCET) 5-325 MG tablet Take 1 tablet by mouth every 6 (six) hours as needed. (Patient not taking: Reported on 11/24/2022) 20 tablet 0   rosuvastatin (CRESTOR) 40 MG tablet Take 1 tablet (40 mg total) by mouth daily with supper. 90 tablet 3   sodium bicarbonate 650 MG tablet Take 650 mg by mouth 2 (two) times daily.     No current facility-administered medications for this visit.     ROS:  See HPI  Physical Exam:    Incision:  well healed Extremities:  palpable radial pulses, palpable thrill in fistula Neuro: sensation grossly intact and equal B UE Lungs: non labored breathing    Assessment/Plan:  This is a 52 y.o. female who is s/p:second stage Basilic AV fistula on 11/13/22.  The fistula has an excellent thrill and the incisions are healing well.  The first stage basilic was created on 79/24.     The fistula may be accessed as of OCT. 24 th.  Once it is working well she can have the  TDC removed.  F/U PRN.     Mosetta Pigeon PA-C [facility] [phone]   Clinic MD:  Hetty Blend

## 2022-12-02 DIAGNOSIS — D509 Iron deficiency anemia, unspecified: Secondary | ICD-10-CM | POA: Diagnosis not present

## 2022-12-02 DIAGNOSIS — N186 End stage renal disease: Secondary | ICD-10-CM | POA: Diagnosis not present

## 2022-12-02 DIAGNOSIS — D631 Anemia in chronic kidney disease: Secondary | ICD-10-CM | POA: Diagnosis not present

## 2022-12-02 DIAGNOSIS — Z992 Dependence on renal dialysis: Secondary | ICD-10-CM | POA: Diagnosis not present

## 2022-12-02 DIAGNOSIS — N2581 Secondary hyperparathyroidism of renal origin: Secondary | ICD-10-CM | POA: Diagnosis not present

## 2022-12-02 DIAGNOSIS — D689 Coagulation defect, unspecified: Secondary | ICD-10-CM | POA: Diagnosis not present

## 2022-12-05 ENCOUNTER — Ambulatory Visit: Payer: Medicaid Other | Admitting: "Endocrinology

## 2022-12-05 DIAGNOSIS — D689 Coagulation defect, unspecified: Secondary | ICD-10-CM | POA: Diagnosis not present

## 2022-12-05 DIAGNOSIS — N186 End stage renal disease: Secondary | ICD-10-CM | POA: Diagnosis not present

## 2022-12-05 DIAGNOSIS — N2581 Secondary hyperparathyroidism of renal origin: Secondary | ICD-10-CM | POA: Diagnosis not present

## 2022-12-05 DIAGNOSIS — Z992 Dependence on renal dialysis: Secondary | ICD-10-CM | POA: Diagnosis not present

## 2022-12-05 DIAGNOSIS — D509 Iron deficiency anemia, unspecified: Secondary | ICD-10-CM | POA: Diagnosis not present

## 2022-12-06 ENCOUNTER — Ambulatory Visit: Payer: Medicaid Other | Admitting: Family Medicine

## 2022-12-07 DIAGNOSIS — N2581 Secondary hyperparathyroidism of renal origin: Secondary | ICD-10-CM | POA: Diagnosis not present

## 2022-12-07 DIAGNOSIS — D509 Iron deficiency anemia, unspecified: Secondary | ICD-10-CM | POA: Diagnosis not present

## 2022-12-07 DIAGNOSIS — N186 End stage renal disease: Secondary | ICD-10-CM | POA: Diagnosis not present

## 2022-12-07 DIAGNOSIS — D689 Coagulation defect, unspecified: Secondary | ICD-10-CM | POA: Diagnosis not present

## 2022-12-07 DIAGNOSIS — Z992 Dependence on renal dialysis: Secondary | ICD-10-CM | POA: Diagnosis not present

## 2022-12-08 ENCOUNTER — Other Ambulatory Visit: Payer: Medicaid Other | Admitting: *Deleted

## 2022-12-08 NOTE — Patient Outreach (Signed)
Medicaid Managed Care   Nurse Care Manager Note  12/08/2022 Name:  Ana [name] MRN:  [id number] DOB:  [birth year]  Ana [name] is an 52 y.o. year old female who is a primary patient of [name], DO.  The [facility] Managed Care Coordination team was consulted for assistance with:    DMII  Ms. Penley was given information about Medicaid Managed Care Coordination team services today. [facility] Patient agreed to services and verbal consent obtained.  Engaged with patient by telephone for follow up visit in response to provider referral for case management and/or care coordination services.   Assessments/Interventions:  Review of past medical history, allergies, medications, health status, including review of consultants reports, laboratory and other test data, was performed as part of comprehensive evaluation and provision of chronic care management services.  SDOH (Social Determinants of Health) assessments and interventions performed: SDOH Interventions    Flowsheet Row Patient Outreach Telephone from 11/24/2022 in [place] POPULATION HEALTH DEPARTMENT ED to Hosp-Admission (Discharged) from 12/21/2021 in [place] MEDICAL SURGICAL UNIT  SDOH Interventions    Food Insecurity Interventions Other (Comment)  [BSW referral] --  Housing Interventions Intervention Not Indicated Intervention Not Indicated  Transportation Interventions Intervention Not Indicated --  Utilities Interventions Intervention Not Indicated --       Care Plan  Allergies  Allergen Reactions   Ranitidine Hcl Rash    Medications Reviewed Today     Reviewed by [name], RN (Registered Nurse) on 12/08/22 at 1445  Med List Status: <None>   Medication Order Taking? Sig Documenting Provider Last Dose Status Informant  amLODipine (NORVASC) 10 MG tablet [id number] Yes TAKE 1 TABLET BY MOUTH DAILY  Patient taking differently: Take 10 mg by mouth daily.   Erick Blinks, MD Taking Active Child   Blood Glucose Monitoring Suppl DEVI [id number] Yes Use up to 4 times daily to check blood sugar. May substitute to any manufacturer covered by patient's insurance. [name], DO Taking Active   calcitRIOL (ROCALTROL) 0.25 MCG capsule [id number] Yes Take 0.25 mcg by mouth daily. [provider] Taking Active Child  carvedilol (COREG) 25 MG tablet [id number] Yes Take 1 tablet (25 mg total) by mouth 2 (two) times daily. Ellsworth Lennox, PA-C Taking Active Child  Continuous Glucose Sensor (FREESTYLE LIBRE 3 SENSOR) [place] [id number] No Place 1 sensor on the skin every 14 days. Use to check glucose continuously  Patient not taking: Reported on 12/08/2022   [name], DO Not Taking Active   furosemide (LASIX) 40 MG tablet [id number] Yes Take 80 mg by mouth 2 (two) times daily. [provider] Taking Active Child  Glucose Blood (BLOOD GLUCOSE TEST STRIPS) STRP [id number] Yes Use up to 4 times daily to check blood sugar. May substitute to any manufacturer covered by patient's insurance. [name], DO Taking Active   hydrALAZINE (APRESOLINE) 100 MG tablet [id number] Yes Take 1 tablet (100 mg total) by mouth 3 (three) times daily. Ellsworth Lennox, PA-C Taking Active Child  insulin glargine (LANTUS) 100 UNIT/ML Solostar Pen [id number] Yes Inject 10 Units into the skin daily. [name], DO Taking Active Child, Self  Insulin Pen Needle 31G X 4 MM MISC [id number] Yes Use to administer insulin daily. [name], DO Taking Active Child  Lancet Device MISC [id number] Yes Use up to 4 times daily to check blood sugar. May substitute to any manufacturer covered by patient's insurance. [name], DO Taking Active  Lancets Misc. MISC [id number] Yes Use up to 4 times daily to check blood sugar. May substitute to any manufacturer covered by patient's insurance. [name], DO Taking Active   levothyroxine (SYNTHROID) 50 MCG tablet [id number] Yes TAKE 1 TABLET(50 MCG) BY MOUTH EVERY DAY  [name], DO Taking Active Child  oxyCODONE-acetaminophen (PERCOCET) 5-325 MG tablet [id number] No Take 1 tablet by mouth every 6 (six) hours as needed.  Patient not taking: Reported on 11/24/2022   Dara Lords, PA-C Not Taking Active   rosuvastatin (CRESTOR) 40 MG tablet [id number] Yes Take 1 tablet (40 mg total) by mouth daily with supper. Erick Blinks, MD Taking Active Child  sodium bicarbonate 650 MG tablet [id number] Yes Take 650 mg by mouth 2 (two) times daily. 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) 12/21/2021   Hypothyroidism, adult 09/02/2020   Vitamin D deficiency disease 09/02/2020   Type 2 diabetes mellitus with hyperlipidemia (HCC) 09/26/2017   Essential hypertension, benign 09/26/2017   Mixed hyperlipidemia 09/26/2017    Conditions to be addressed/monitored per PCP order:  DMII  Care Plan : RN Care Manager Plan of Care  Updates made by [name], RN since 12/08/2022 12:00 AM     Problem: Healthcare needs related to DMII      Long-Range Goal: Development of Plan of Care to address Healthcare needs related to DMII   Start Date: 11/24/2022  Expected End Date: 02/22/2023  Note:   Current Barriers:  Chronic Disease Management support and education needs related to DMII   RNCM Clinical Goal(s):  Patient will take all medications exactly as prescribed and will call provider for medication related questions as evidenced by patient reports attend all scheduled medical appointments: [facility], 12/27 with Cardiology, 12/30 [facility] the [facility] and 03/29/23 with Dr. Fransico Him as evidenced by provider documentation in EMR continue to  work with RN Care Manager to address care management and care coordination needs related to  DMII as evidenced by adherence to CM Team Scheduled appointments work with Child psychotherapist to address  related to the management of Limited access to food related to the management of DMII as evidenced by review of EMR and patient or Child psychotherapist report through collaboration with Medical illustrator, provider, and care team.   Interventions: Evaluation of current treatment plan related to  self management and patient's adherence to plan as established by provider   Diabetes Interventions:  (Status:  Goal on track:  Yes.) Long Term Goal Assessed patient's understanding of A1c goal: <7% Provided education to patient about basic DM disease process Reviewed medications with patient and discussed importance of medication adherence Counseled on importance of regular laboratory monitoring as prescribed Provided patient with written educational materials related to hypo and hyperglycemia and importance of correct treatment Reviewed scheduled/upcoming provider appointments including: 12/[facility], 12/27 with Cardiology, 12/30 at the [facility] and 03/29/23 with Dr. [name](Endocrinology)  Referral made to social work team for assistance with local food pantries Review of patient status, including review of consultants reports, relevant laboratory and other test results, and medications completed Advised patient to  schedule and eye exam with Ophthalmology Collaborated with PCP requesting new order for [facility] Advised patient to reschedule cancelled appointment with PCP Encouraged patient to work on improving her diet and exercising 30 minutes a day for 5 days a week Lab Results  Component Value Date   HGBA1C 10.9 (H) 08/04/2022    Patient Goals/Self-Care Activities: Take all medications as prescribed Attend all scheduled provider appointments Call provider office for new  concerns or questions  Work with the social worker to address care coordination needs and will continue to work with the clinical team to address health care and disease management related needs schedule appointment with eye doctor drink 6 to 8 glasses of water each day fill half of plate with vegetables limit fast food meals to no more than 1 per week manage portion size  Follow Up Plan:  Telephone follow up appointment with care management team member scheduled for:  12/08/22 at 2:30pm      Follow Up:  Patient agrees to Care Plan and Follow-up.  Plan: The Managed Medicaid care management team will reach out to the patient again over the next 30 days.  Date/time of next scheduled RN care management/care coordination outreach:  01/08/23 at 1:15pm  [name] RN, BSN [place]  [facility] RN Care Coordinator [phone]

## 2022-12-08 NOTE — Patient Instructions (Signed)
Visit Information  Ana [name] was given information about Medicaid Managed Care team care coordination services as a part of their Healthy [facility] Medicaid benefit. [name] verbally consented to engagement with the [facility] Managed Care team.   If you are experiencing a medical emergency, please call 911 or report to your local emergency department or urgent care.   If you have a non-emergency medical problem during routine business hours, please contact your provider's office and ask to speak with a nurse.   For questions related to your Healthy [facility] health plan, please call: [phone] or visit the homepage here: [email or web address]  If you would like to schedule transportation through your Healthy [facility] plan, please call the following number at least 2 days in advance of your appointment: [phone]  For information about your ride after you set it up, call Ride Assist at [phone]. Use this number to activate a Will Call pickup, or if your transportation is late for a scheduled pickup. Use this number, too, if you need to make a change or cancel a previously scheduled reservation.  If you need transportation services right away, call [phone]. The after-hours call center is staffed 24 hours to handle ride assistance and urgent reservation requests (including discharges) 365 days a year. Urgent trips include sick visits, hospital discharge requests and life-sustaining treatment.  Call the [facility] Line at [phone], at any time, 24 hours a day, 7 days a week. If you are in danger or need immediate medical attention call 911.  If you would like help to quit smoking, call 1-800-QUIT-NOW ((949) 489-8813) OR Espaol: 1-855-Djelo-Ya (1-601-093-2355) o para ms informacin haga clic aqu or Text READY to 732-202 to register via text  Ms. [name],   Please see education materials related to diabetes provided as  print materials.   The patient verbalized understanding of instructions, educational materials, and care plan provided today and agreed to receive a mailed copy of patient instructions, educational materials, and care plan.   Telephone follow up appointment with Managed Medicaid care management team member scheduled for:01/08/23 at 1:15pm  [name] RN, BSN [place]  Value-Based Care [facility] Health RN Care Coordinator [phone]   Following is a copy of your plan of care:  Care Plan : RN Care Manager Plan of Care  Updates made by [name], RN since 12/08/2022 12:00 AM     Problem: Healthcare needs related to DMII      Long-Range Goal: Development of Plan of Care to address Healthcare needs related to DMII   Start Date: 11/24/2022  Expected End Date: 02/22/2023  Note:   Current Barriers:  Chronic Disease Management support and education needs related to DMII   RNCM Clinical Goal(s):  Patient will take all medications exactly as prescribed and will call provider for medication related questions as evidenced by patient reports attend all scheduled medical appointments: [facility], 12/27 with Cardiology, 12/30 [facility] and 03/29/23 with Dr. Fransico Him as evidenced by provider documentation in EMR continue to work with RN Care Manager to address care management and care coordination needs related to  DMII as evidenced by adherence to CM Team Scheduled appointments work with social worker to address  related to the management of Limited access to food related to the management of DMII as evidenced by review of EMR and patient or social worker report through collaboration with Medical illustrator, provider, and care team.   Interventions: Evaluation of current treatment  plan related to  self management and patient's adherence to plan as established by provider   Diabetes Interventions:  (Status:  Goal on track:  Yes.) Long Term  Goal Assessed patient's understanding of A1c goal: <7% Provided education to patient about basic DM disease process Reviewed medications with patient and discussed importance of medication adherence Counseled on importance of regular laboratory monitoring as prescribed Provided patient with written educational materials related to hypo and hyperglycemia and importance of correct treatment Reviewed scheduled/upcoming provider appointments including: 12/[facility] the [facility], 12/27 with Cardiology, 12/30 at the [facility] and 03/29/23 with Dr. [name](Endocrinology)  Referral made to social work team for assistance with local food pantries Review of patient status, including review of consultants reports, relevant laboratory and other test results, and medications completed Advised patient to schedule and eye exam with Ophthalmology Collaborated with PCP requesting new order for [facility] Reader Advised patient to reschedule cancelled appointment with PCP Encouraged patient to work on improving her diet and exercising 30 minutes a day for 5 days a week Lab Results  Component Value Date   HGBA1C 10.9 (H) 08/04/2022    Patient Goals/Self-Care Activities: Take all medications as prescribed Attend all scheduled provider appointments Call provider office for new concerns or questions  Work with the social worker to address care coordination needs and will continue to work with the clinical team to address health care and disease management related needs schedule appointment with eye doctor drink 6 to 8 glasses of water each day fill half of plate with vegetables limit fast food meals to no more than 1 per week manage portion size  Follow Up Plan:  Telephone follow up appointment with care management team member scheduled for:  12/08/22 at 2:30pm

## 2022-12-09 DIAGNOSIS — D689 Coagulation defect, unspecified: Secondary | ICD-10-CM | POA: Diagnosis not present

## 2022-12-09 DIAGNOSIS — N2581 Secondary hyperparathyroidism of renal origin: Secondary | ICD-10-CM | POA: Diagnosis not present

## 2022-12-09 DIAGNOSIS — N186 End stage renal disease: Secondary | ICD-10-CM | POA: Diagnosis not present

## 2022-12-09 DIAGNOSIS — Z992 Dependence on renal dialysis: Secondary | ICD-10-CM | POA: Diagnosis not present

## 2022-12-09 DIAGNOSIS — D509 Iron deficiency anemia, unspecified: Secondary | ICD-10-CM | POA: Diagnosis not present

## 2022-12-10 ENCOUNTER — Other Ambulatory Visit: Payer: Self-pay | Admitting: Family Medicine

## 2022-12-10 MED ORDER — FREESTYLE LIBRE 3 READER DEVI: 0 refills | Status: DC

## 2022-12-10 MED ORDER — FREESTYLE LIBRE 3 SENSOR MISC: 6 refills | Status: DC

## 2022-12-12 DIAGNOSIS — D509 Iron deficiency anemia, unspecified: Secondary | ICD-10-CM | POA: Diagnosis not present

## 2022-12-12 DIAGNOSIS — E1122 Type 2 diabetes mellitus with diabetic chronic kidney disease: Secondary | ICD-10-CM | POA: Diagnosis not present

## 2022-12-12 DIAGNOSIS — N2581 Secondary hyperparathyroidism of renal origin: Secondary | ICD-10-CM | POA: Diagnosis not present

## 2022-12-12 DIAGNOSIS — Z992 Dependence on renal dialysis: Secondary | ICD-10-CM | POA: Diagnosis not present

## 2022-12-12 DIAGNOSIS — D689 Coagulation defect, unspecified: Secondary | ICD-10-CM | POA: Diagnosis not present

## 2022-12-12 DIAGNOSIS — D631 Anemia in chronic kidney disease: Secondary | ICD-10-CM | POA: Diagnosis not present

## 2022-12-12 DIAGNOSIS — N186 End stage renal disease: Secondary | ICD-10-CM | POA: Diagnosis not present

## 2022-12-14 DIAGNOSIS — N186 End stage renal disease: Secondary | ICD-10-CM | POA: Diagnosis not present

## 2022-12-14 DIAGNOSIS — Z992 Dependence on renal dialysis: Secondary | ICD-10-CM | POA: Diagnosis not present

## 2022-12-14 DIAGNOSIS — D509 Iron deficiency anemia, unspecified: Secondary | ICD-10-CM | POA: Diagnosis not present

## 2022-12-14 DIAGNOSIS — D689 Coagulation defect, unspecified: Secondary | ICD-10-CM | POA: Diagnosis not present

## 2022-12-14 DIAGNOSIS — D631 Anemia in chronic kidney disease: Secondary | ICD-10-CM | POA: Diagnosis not present

## 2022-12-14 DIAGNOSIS — E1122 Type 2 diabetes mellitus with diabetic chronic kidney disease: Secondary | ICD-10-CM | POA: Diagnosis not present

## 2022-12-14 DIAGNOSIS — N2581 Secondary hyperparathyroidism of renal origin: Secondary | ICD-10-CM | POA: Diagnosis not present

## 2022-12-16 DIAGNOSIS — Z992 Dependence on renal dialysis: Secondary | ICD-10-CM | POA: Diagnosis not present

## 2022-12-16 DIAGNOSIS — N2581 Secondary hyperparathyroidism of renal origin: Secondary | ICD-10-CM | POA: Diagnosis not present

## 2022-12-16 DIAGNOSIS — E1122 Type 2 diabetes mellitus with diabetic chronic kidney disease: Secondary | ICD-10-CM | POA: Diagnosis not present

## 2022-12-16 DIAGNOSIS — D631 Anemia in chronic kidney disease: Secondary | ICD-10-CM | POA: Diagnosis not present

## 2022-12-16 DIAGNOSIS — N186 End stage renal disease: Secondary | ICD-10-CM | POA: Diagnosis not present

## 2022-12-16 DIAGNOSIS — D689 Coagulation defect, unspecified: Secondary | ICD-10-CM | POA: Diagnosis not present

## 2022-12-16 DIAGNOSIS — D509 Iron deficiency anemia, unspecified: Secondary | ICD-10-CM | POA: Diagnosis not present

## 2022-12-19 DIAGNOSIS — N2581 Secondary hyperparathyroidism of renal origin: Secondary | ICD-10-CM | POA: Diagnosis not present

## 2022-12-19 DIAGNOSIS — N186 End stage renal disease: Secondary | ICD-10-CM | POA: Diagnosis not present

## 2022-12-19 DIAGNOSIS — D689 Coagulation defect, unspecified: Secondary | ICD-10-CM | POA: Diagnosis not present

## 2022-12-19 DIAGNOSIS — Z992 Dependence on renal dialysis: Secondary | ICD-10-CM | POA: Diagnosis not present

## 2022-12-19 DIAGNOSIS — D509 Iron deficiency anemia, unspecified: Secondary | ICD-10-CM | POA: Diagnosis not present

## 2022-12-21 DIAGNOSIS — D509 Iron deficiency anemia, unspecified: Secondary | ICD-10-CM | POA: Diagnosis not present

## 2022-12-21 DIAGNOSIS — Z992 Dependence on renal dialysis: Secondary | ICD-10-CM | POA: Diagnosis not present

## 2022-12-21 DIAGNOSIS — N2581 Secondary hyperparathyroidism of renal origin: Secondary | ICD-10-CM | POA: Diagnosis not present

## 2022-12-21 DIAGNOSIS — D689 Coagulation defect, unspecified: Secondary | ICD-10-CM | POA: Diagnosis not present

## 2022-12-21 DIAGNOSIS — N186 End stage renal disease: Secondary | ICD-10-CM | POA: Diagnosis not present

## 2022-12-22 ENCOUNTER — Telehealth: Payer: Self-pay | Admitting: Pharmacist

## 2022-12-22 DIAGNOSIS — E1169 Type 2 diabetes mellitus with other specified complication: Secondary | ICD-10-CM

## 2022-12-22 MED ORDER — FREESTYLE LIBRE 3 READER DEVI: 0 refills | Status: DC

## 2022-12-22 NOTE — Telephone Encounter (Signed)
PA SUBMITTED.

## 2022-12-23 DIAGNOSIS — N2581 Secondary hyperparathyroidism of renal origin: Secondary | ICD-10-CM | POA: Diagnosis not present

## 2022-12-23 DIAGNOSIS — Z992 Dependence on renal dialysis: Secondary | ICD-10-CM | POA: Diagnosis not present

## 2022-12-23 DIAGNOSIS — N186 End stage renal disease: Secondary | ICD-10-CM | POA: Diagnosis not present

## 2022-12-23 DIAGNOSIS — D689 Coagulation defect, unspecified: Secondary | ICD-10-CM | POA: Diagnosis not present

## 2022-12-23 DIAGNOSIS — D509 Iron deficiency anemia, unspecified: Secondary | ICD-10-CM | POA: Diagnosis not present

## 2022-12-26 DIAGNOSIS — D509 Iron deficiency anemia, unspecified: Secondary | ICD-10-CM | POA: Diagnosis not present

## 2022-12-26 DIAGNOSIS — N2581 Secondary hyperparathyroidism of renal origin: Secondary | ICD-10-CM | POA: Diagnosis not present

## 2022-12-26 DIAGNOSIS — D689 Coagulation defect, unspecified: Secondary | ICD-10-CM | POA: Diagnosis not present

## 2022-12-26 DIAGNOSIS — Z992 Dependence on renal dialysis: Secondary | ICD-10-CM | POA: Diagnosis not present

## 2022-12-26 DIAGNOSIS — N186 End stage renal disease: Secondary | ICD-10-CM | POA: Diagnosis not present

## 2022-12-28 DIAGNOSIS — D509 Iron deficiency anemia, unspecified: Secondary | ICD-10-CM | POA: Diagnosis not present

## 2022-12-28 DIAGNOSIS — D689 Coagulation defect, unspecified: Secondary | ICD-10-CM | POA: Diagnosis not present

## 2022-12-28 DIAGNOSIS — N186 End stage renal disease: Secondary | ICD-10-CM | POA: Diagnosis not present

## 2022-12-28 DIAGNOSIS — N2581 Secondary hyperparathyroidism of renal origin: Secondary | ICD-10-CM | POA: Diagnosis not present

## 2022-12-28 DIAGNOSIS — Z992 Dependence on renal dialysis: Secondary | ICD-10-CM | POA: Diagnosis not present

## 2022-12-29 DIAGNOSIS — Z888 Allergy status to other drugs, medicaments and biological substances status: Secondary | ICD-10-CM | POA: Diagnosis not present

## 2022-12-29 DIAGNOSIS — Z833 Family history of diabetes mellitus: Secondary | ICD-10-CM | POA: Diagnosis not present

## 2022-12-29 DIAGNOSIS — Z992 Dependence on renal dialysis: Secondary | ICD-10-CM | POA: Diagnosis not present

## 2022-12-29 DIAGNOSIS — N186 End stage renal disease: Secondary | ICD-10-CM | POA: Diagnosis not present

## 2022-12-29 DIAGNOSIS — I509 Heart failure, unspecified: Secondary | ICD-10-CM | POA: Diagnosis not present

## 2022-12-29 DIAGNOSIS — N189 Chronic kidney disease, unspecified: Secondary | ICD-10-CM | POA: Diagnosis not present

## 2022-12-29 DIAGNOSIS — Z8249 Family history of ischemic heart disease and other diseases of the circulatory system: Secondary | ICD-10-CM | POA: Diagnosis not present

## 2022-12-29 DIAGNOSIS — E785 Hyperlipidemia, unspecified: Secondary | ICD-10-CM | POA: Diagnosis not present

## 2022-12-29 DIAGNOSIS — I130 Hypertensive heart and chronic kidney disease with heart failure and stage 1 through stage 4 chronic kidney disease, or unspecified chronic kidney disease: Secondary | ICD-10-CM | POA: Diagnosis not present

## 2022-12-29 DIAGNOSIS — Z6837 Body mass index (BMI) 37.0-37.9, adult: Secondary | ICD-10-CM | POA: Diagnosis not present

## 2022-12-29 DIAGNOSIS — E1122 Type 2 diabetes mellitus with diabetic chronic kidney disease: Secondary | ICD-10-CM | POA: Diagnosis not present

## 2022-12-29 DIAGNOSIS — E039 Hypothyroidism, unspecified: Secondary | ICD-10-CM | POA: Diagnosis not present

## 2022-12-29 DIAGNOSIS — Z79890 Hormone replacement therapy (postmenopausal): Secondary | ICD-10-CM | POA: Diagnosis not present

## 2022-12-29 DIAGNOSIS — Z794 Long term (current) use of insulin: Secondary | ICD-10-CM | POA: Diagnosis not present

## 2023-01-01 ENCOUNTER — Other Ambulatory Visit: Payer: Self-pay

## 2023-01-01 NOTE — Patient Instructions (Signed)
Visit Information  Ana [name] was given information about Medicaid Managed Care team care coordination services as a part of their Healthy [facility] Medicaid benefit. Ana [name] verbally consented to engagement with the [facility] Managed Care team.   If you are experiencing a medical emergency, please call 911 or report to your local emergency department or urgent care.   If you have a non-emergency medical problem during routine business hours, please contact your provider's office and ask to speak with a nurse.   For questions related to your Healthy [facility] health plan, please call: [phone] or visit the homepage here: [email or web address]  If you would like to schedule transportation through your Healthy [facility] plan, please call the following number at least 2 days in advance of your appointment: [phone]  For information about your ride after you set it up, call Ride Assist at [phone]. Use this number to activate a Will Call pickup, or if your transportation is late for a scheduled pickup. Use this number, too, if you need to make a change or cancel a previously scheduled reservation.  If you need transportation services right away, call [phone]. The after-hours call center is staffed 24 hours to handle ride assistance and urgent reservation requests (including discharges) 365 days a year. Urgent trips include sick visits, hospital discharge requests and life-sustaining treatment.  Call the [facility] Line at [phone], at any time, 24 hours a day, 7 days a week. If you are in danger or need immediate medical attention call 911.  If you would like help to quit smoking, call 1-800-QUIT-NOW ((458) 807-7806) OR Espaol: 1-855-Djelo-Ya (4-742-595-6387) o para ms informacin haga clic aqu or Text READY to 564-332 to register via text  Ana [name] - following are the goals we discussed in your visit today:   Goals  Addressed   None       The  Patient                                              has been provided with contact information for the Managed Medicaid care management team and has been advised to call with any health related questions or concerns.   Gus Puma, Kenard Gower, MHA [facility] Health  Managed Medicaid Social Worker [phone]   Following is a copy of your plan of care:  Care Plan : RN Care Manager Plan of Care  Updates made by Shaune Leeks since 01/01/2023 12:00 AM     Problem: Healthcare needs related to DMII      Long-Range Goal: Development of Plan of Care to address Healthcare needs related to DMII   Start Date: 11/24/2022  Expected End Date: 02/22/2023  Note:   Current Barriers:  Chronic Disease Management support and education needs related to DMII   RNCM Clinical Goal(s):  Patient will take all medications exactly as prescribed and will call provider for medication related questions as evidenced by patient reports attend all scheduled medical appointments: 12[facility] the [facility], 12/27 with Cardiology, 12/30 at the [facility] and 03/29/23 with Dr. Fransico Him as evidenced by provider documentation in EMR continue to work with RN Care Manager to address care management and care coordination needs related to  DMII as evidenced by adherence to CM Team Scheduled appointments work with social worker to address  related to the management of Limited access to food  related to the management of DMII as evidenced by review of EMR and patient or social worker report through collaboration with Medical illustrator, provider, and care team.   Interventions: Evaluation of current treatment plan related to  self management and patient's adherence to plan as established by provider.  BSW completed a telephone outreach with patient, she states she did receive the resources BSW sent her, she did call them but no one answered. BSW encouraged patient to keep trying. No other  resources are needed at this time. BSW provided contact information for any future needs.    Diabetes Interventions:  (Status:  Goal on track:  Yes.) Long Term Goal Assessed patient's understanding of A1c goal: <7% Provided education to patient about basic DM disease process Reviewed medications with patient and discussed importance of medication adherence Counseled on importance of regular laboratory monitoring as prescribed Provided patient with written educational materials related to hypo and hyperglycemia and importance of correct treatment Reviewed scheduled/upcoming provider appointments including: 12/16 at the [facility], 12/27 with Cardiology, 12/30 at the [facility] and 03/29/23 with Dr. [name](Endocrinology)  Referral made to social work team for assistance with local food pantries Review of patient status, including review of consultants reports, relevant laboratory and other test results, and medications completed Advised patient to schedule and eye exam with Ophthalmology Collaborated with PCP requesting new order for [facility] Reader Advised patient to reschedule cancelled appointment with PCP Encouraged patient to work on improving her diet and exercising 30 minutes a day for 5 days a week Lab Results  Component Value Date   HGBA1C 10.9 (H) 08/04/2022    Patient Goals/Self-Care Activities: Take all medications as prescribed Attend all scheduled provider appointments Call provider office for new concerns or questions  Work with the social worker to address care coordination needs and will continue to work with the clinical team to address health care and disease management related needs schedule appointment with eye doctor drink 6 to 8 glasses of water each day fill half of plate with vegetables limit fast food meals to no more than 1 per week manage portion size  Follow Up Plan:  Telephone follow up appointment with care management team member  scheduled for:  12/08/22 at 2:30pm

## 2023-01-01 NOTE — Patient Outreach (Signed)
  Medicaid Managed Care Social Work Note  01/01/2023 Name:  Ana [name] MRN:  [id number] DOB:  14-Apr-1970  Ana [name] is an 52 y.o. year old female who is a primary patient of [name], DO.  The Medicaid Managed Care Coordination team was consulted for assistance with:  Community Resources   [name] was given information about Medicaid Managed Care Coordination team services today. [name] Patient agreed to services and verbal consent obtained.  Engaged with patient  for by telephone forfollow up visit in response to referral for case management and/or care coordination services.   Assessments/Interventions:  Review of past medical history, allergies, medications, health status, including review of consultants reports, laboratory and other test data, was performed as part of comprehensive evaluation and provision of chronic care management services.  SDOH: (Social Determinant of Health) assessments and interventions performed: SDOH Interventions    Flowsheet Row Patient Outreach Telephone from 11/24/2022 in [place] POPULATION HEALTH DEPARTMENT ED to Hosp-Admission (Discharged) from 12/21/2021 in [place] [facility]  SDOH Interventions    Food Insecurity Interventions Other (Comment)  [BSW referral] --  Housing Interventions Intervention Not Indicated Intervention Not Indicated  Transportation Interventions Intervention Not Indicated --  Utilities Interventions Intervention Not Indicated --     BSW completed a telephone outreach with patient, she states she did receive the resources BSW sent her, she did call them but no one answered. BSW encouraged patient to keep trying. No other resources are needed at this time. BSW provided contact information for any future needs.   Advanced Directives Status:  Not addressed in this encounter.  Care Plan                 Allergies  Allergen Reactions   Ranitidine Hcl Rash    Medications Reviewed Today    Medications were not reviewed in this encounter     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) 12/21/2021   Hypothyroidism, adult 09/02/2020   Vitamin D deficiency disease 09/02/2020   Type 2 diabetes mellitus with hyperlipidemia (HCC) 09/26/2017   Essential hypertension, benign 09/26/2017   Mixed hyperlipidemia 09/26/2017    Conditions to be addressed/monitored per PCP order:   community resources  There are no care plans that you recently modified to display for this patient.   Follow up:  Patient agrees to Care Plan and Follow-up.  Plan: The  Patient has been provided with contact information for the Managed Medicaid care management team and has been advised to call with any health related questions or concerns.    Abelino Derrick, MHA [facility]  Managed [facility] Social Worker [phone]

## 2023-01-02 DIAGNOSIS — D509 Iron deficiency anemia, unspecified: Secondary | ICD-10-CM | POA: Diagnosis not present

## 2023-01-02 DIAGNOSIS — N2581 Secondary hyperparathyroidism of renal origin: Secondary | ICD-10-CM | POA: Diagnosis not present

## 2023-01-02 DIAGNOSIS — N186 End stage renal disease: Secondary | ICD-10-CM | POA: Diagnosis not present

## 2023-01-02 DIAGNOSIS — D631 Anemia in chronic kidney disease: Secondary | ICD-10-CM | POA: Diagnosis not present

## 2023-01-02 DIAGNOSIS — D689 Coagulation defect, unspecified: Secondary | ICD-10-CM | POA: Diagnosis not present

## 2023-01-02 DIAGNOSIS — Z992 Dependence on renal dialysis: Secondary | ICD-10-CM | POA: Diagnosis not present

## 2023-01-04 DIAGNOSIS — N2581 Secondary hyperparathyroidism of renal origin: Secondary | ICD-10-CM | POA: Diagnosis not present

## 2023-01-04 DIAGNOSIS — D631 Anemia in chronic kidney disease: Secondary | ICD-10-CM | POA: Diagnosis not present

## 2023-01-04 DIAGNOSIS — D689 Coagulation defect, unspecified: Secondary | ICD-10-CM | POA: Diagnosis not present

## 2023-01-04 DIAGNOSIS — Z992 Dependence on renal dialysis: Secondary | ICD-10-CM | POA: Diagnosis not present

## 2023-01-04 DIAGNOSIS — N186 End stage renal disease: Secondary | ICD-10-CM | POA: Diagnosis not present

## 2023-01-04 DIAGNOSIS — D509 Iron deficiency anemia, unspecified: Secondary | ICD-10-CM | POA: Diagnosis not present

## 2023-01-06 DIAGNOSIS — D689 Coagulation defect, unspecified: Secondary | ICD-10-CM | POA: Diagnosis not present

## 2023-01-06 DIAGNOSIS — Z992 Dependence on renal dialysis: Secondary | ICD-10-CM | POA: Diagnosis not present

## 2023-01-06 DIAGNOSIS — D631 Anemia in chronic kidney disease: Secondary | ICD-10-CM | POA: Diagnosis not present

## 2023-01-06 DIAGNOSIS — D509 Iron deficiency anemia, unspecified: Secondary | ICD-10-CM | POA: Diagnosis not present

## 2023-01-06 DIAGNOSIS — N2581 Secondary hyperparathyroidism of renal origin: Secondary | ICD-10-CM | POA: Diagnosis not present

## 2023-01-06 DIAGNOSIS — N186 End stage renal disease: Secondary | ICD-10-CM | POA: Diagnosis not present

## 2023-01-08 ENCOUNTER — Other Ambulatory Visit: Payer: Self-pay | Admitting: *Deleted

## 2023-01-08 NOTE — Patient Outreach (Signed)
Medicaid Managed Care   Nurse Care Manager Note  01/08/2023 Name:  Ana [name] MRN:  [id number] DOB:  [birth year]  Ana [name] is an 52 y.o. year old female who is a primary patient of Ana Sams, DO.  The [facility] Managed Care Coordination team was consulted for assistance with:    DMII  Ana [name] was given information about Medicaid Managed Care Coordination team services today. Ana [name] Patient agreed to services and verbal consent obtained.  Engaged with patient by telephone for follow up visit in response to provider referral for case management and/or care coordination services.   Assessments/Interventions:  Review of past medical history, allergies, medications, health status, including review of consultants reports, laboratory and other test data, was performed as part of comprehensive evaluation and provision of chronic care management services.  SDOH (Social Determinants of Health) assessments and interventions performed: SDOH Interventions    Flowsheet Row Patient Outreach Telephone from 11/24/2022 in [place] POPULATION HEALTH DEPARTMENT ED to Hosp-Admission (Discharged) from 12/21/2021 in [place] [facility]ICAL SURGICAL UNIT  SDOH Interventions    Food Insecurity Interventions Other (Comment)  [BSW referral] --  Housing Interventions Intervention Not Indicated Intervention Not Indicated  Transportation Interventions Intervention Not Indicated --  Utilities Interventions Intervention Not Indicated --       Care Plan  Allergies  Allergen Reactions   Ranitidine Hcl Rash    Medications Reviewed Today     Reviewed by Ana Dach, RN (Registered Nurse) on 01/08/23 at 1323  Med List Status: <None>   Medication Order Taking? Sig Documenting Provider Last Dose Status Informant  amLODipine (NORVASC) 10 MG tablet [id number] Yes TAKE 1 TABLET BY MOUTH DAILY  Patient taking differently: Take 10 mg by mouth daily.   Ana [name] Taking Active Child   Blood Glucose Monitoring Suppl DEVI [id number] No Use up to 4 times daily to check blood sugar. May substitute to any manufacturer covered by patient's insurance.  Patient not taking: Reported on 01/08/2023   Ana Sams, DO Not Taking Active   calcitRIOL (ROCALTROL) 0.25 MCG capsule [id number] Yes Take 0.25 mcg by mouth daily. Provider, Historical, [name] Taking Active Child  carvedilol (COREG) 25 MG tablet [id number] Yes Take 1 tablet (25 mg total) by mouth 2 (two) times daily. Ana Lennox, PA-C Taking Active Child  Continuous Glucose Receiver (FREESTYLE LIBRE 3 READER) DEVI [id number] No Use to check blood sugar via CGM; DX:E11.9  Patient not taking: Reported on 01/08/2023   Ana Sams, DO Not Taking Active   Continuous Glucose Sensor (FREESTYLE LIBRE 3 SENSOR) [place] [id number] No Place 1 sensor on the skin every 14 days. Use to check glucose continuously  Patient not taking: Reported on 01/08/2023   Ana Sams, DO Not Taking Active   furosemide (LASIX) 40 MG tablet [id number] Yes Take 80 mg by mouth 2 (two) times daily. Provider, Historical, [name] Taking Active Child  Glucose Blood (BLOOD GLUCOSE TEST STRIPS) STRP [id number] No Use up to 4 times daily to check blood sugar. May substitute to any manufacturer covered by patient's insurance.  Patient not taking: Reported on 01/08/2023   Ana Sams, DO Not Taking Active   hydrALAZINE (APRESOLINE) 100 MG tablet [id number] Yes Take 1 tablet (100 mg total) by mouth 3 (three) times daily. Ana Lennox, PA-C Taking Active Child  insulin glargine (LANTUS) 100 UNIT/ML Solostar Pen [id number] Yes Inject 10 Units into the skin daily. Ana Sams, DO Taking Active Child,  Self  Insulin Pen Needle 31G X 4 MM MISC [id number] Yes Use to administer insulin daily. Ana Sams, DO Taking Active Child  Lancet Device MISC [id number] Yes Use up to 4 times daily to check blood sugar. May substitute to any manufacturer covered by patient's insurance. Ana Sams, DO Taking Active   Lancets Misc. MISC [id number] No Use up to 4 times daily to check blood sugar. May substitute to any manufacturer covered by patient's insurance.  Patient not taking: Reported on 01/08/2023   Ana Sams, DO Not Taking Active   levothyroxine (SYNTHROID) 50 MCG tablet [id number] Yes TAKE 1 TABLET(50 MCG) BY MOUTH EVERY DAY Ana Sams, DO Taking Active Child  oxyCODONE-acetaminophen (PERCOCET) 5-325 MG tablet [id number] No Take 1 tablet by mouth every 6 (six) hours as needed.  Patient not taking: Reported on 11/24/2022   Ana Lords, PA-C Not Taking Active   rosuvastatin (CRESTOR) 40 MG tablet [id number] Yes Take 1 tablet (40 mg total) by mouth daily with supper. Ana [name] Taking Active Child  sodium bicarbonate 650 MG tablet [id number] Yes Take 650 mg by mouth 2 (two) times daily. Provider, Historical, [name] Taking Active Child            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) 12/21/2021   Hypothyroidism, adult 09/02/2020   Vitamin D deficiency disease 09/02/2020   Type 2 diabetes mellitus with hyperlipidemia (HCC) 09/26/2017   Essential hypertension, benign 09/26/2017   Mixed hyperlipidemia 09/26/2017    Conditions to be addressed/monitored per PCP order:  DMII  Care Plan : RN Care Manager Plan of Care  Updates made by Ana Dach, RN since 01/08/2023 12:00 AM     Problem: Healthcare needs related to DMII      Long-Range Goal: Development of Plan of Care to address Healthcare needs related to DMII   Start Date: 11/24/2022  Expected End Date: 02/22/2023  Note:   Current Barriers:  Chronic Disease Management support and education needs related to DMII Ana [name] is taking insulin daily,  but is not checking her blood sugar. She has not received CGM. [name] has not scheduled an eye exam.  RNCM Clinical Goal(s):  Patient will take all medications exactly as prescribed and will call provider for medication related questions as evidenced by patient reports attend all scheduled medical appointments: 12/16 at the [facility], 12/27 with Cardiology, 12/30 at the [facility]plant Clinic and 03/29/23 with Dr. Fransico Him as evidenced by provider documentation in EMR continue to work with RN Care Manager to address care management and care coordination needs related to  DMII as evidenced by adherence to CM Team Scheduled appointments work with social worker to address  related to the management of Limited access to food related to the management of DMII as evidenced by review of EMR and patient or Child psychotherapist report through collaboration with Medical illustrator, provider, and care team.   Interventions: Evaluation of current treatment plan related to  self management and patient's adherence to plan as established by provider.    Diabetes Interventions:  (Status:  Goal on track:  Yes.) Long Term Goal Assessed patient's understanding of A1c goal: <7% Provided education to patient about basic DM disease  process Reviewed medications with patient and discussed importance of medication adherence Counseled on importance of regular laboratory monitoring as prescribed Provided patient with written educational materials related to hypo and hyperglycemia and importance of correct treatment Reviewed scheduled/upcoming provider appointments including: 12/16 at the [facility]plant Clinic, 12/27 with Cardiology, 12/30 at the Transplant Clinic and 03/29/23 with Dr. [name](Endocrinology)  Referral made to social work team for assistance with local food pantries Review of patient status, including review of consultants reports, relevant laboratory and other test results, and medications completed Advised patient to  schedule and eye exam with Ophthalmology-Provided with [facility] Ophthalmology [phone] [facility] with Pharmacist requesting update on [facility] Reader Advised patient to reschedule cancelled appointment with PCP-revisited Encouraged patient to work on improving her diet and exercising 30 minutes a day for 5 days a week Lab Results  Component Value Date   HGBA1C 10.9 (H) 08/04/2022    Patient Goals/Self-Care Activities: Take all medications as prescribed Attend all scheduled provider appointments Call provider office for new concerns or questions  Work with the social worker to address care coordination needs and will continue to work with the clinical team to address health care and disease management related needs schedule appointment with eye doctor drink 6 to 8 glasses of water each day fill half of plate with vegetables limit fast food meals to no more than 1 per week manage portion size  Follow Up Plan:  Telephone follow up appointment with care management team member scheduled for:  02/07/23 at 1:30pm      Follow Up:  Patient agrees to Care Plan and Follow-up.  Plan: The Managed Medicaid care management team will reach out to the patient again over the next 30 days.  Date/time of next scheduled RN care management/care coordination outreach:  02/07/23 at 1:15pm  [name] RN, BSN [place]  Value-Based Care [facility] Health RN Care Coordinator [phone]

## 2023-01-08 NOTE — Patient Instructions (Signed)
Visit Information  Ms. Shearn was given information about Medicaid Managed Care team care coordination services as a part of their Healthy [facility] Medicaid benefit. Simrit Rosica Cortright verbally consented to engagement with the [facility] Managed Care team.   If you are experiencing a medical emergency, please call 911 or report to your local emergency department or urgent care.   If you have a non-emergency medical problem during routine business hours, please contact your provider's office and ask to speak with a nurse.   For questions related to your Healthy [facility] health plan, please call: [phone] or visit the homepage here: [email or web address]  If you would like to schedule transportation through your Healthy [facility] plan, please call the following number at least 2 days in advance of your appointment: [phone]  For information about your ride after you set it up, call Ride Assist at [phone]. Use this number to activate a Will Call pickup, or if your transportation is late for a scheduled pickup. Use this number, too, if you need to make a change or cancel a previously scheduled reservation.  If you need transportation services right away, call [phone]. The after-hours call center is staffed 24 hours to handle ride assistance and urgent reservation requests (including discharges) 365 days a year. Urgent trips include sick visits, hospital discharge requests and life-sustaining treatment.  Call the [facility] Line at [phone], at any time, 24 hours a day, 7 days a week. If you are in danger or need immediate medical attention call 911.  If you would like help to quit smoking, call 1-800-QUIT-NOW ((424)494-7923) OR Espaol: 1-855-Djelo-Ya (9-518-841-6606) o para ms informacin haga clic aqu or Text READY to 301-601 to register via text  Ms. Hohler,   Please see education materials related to diabetes provided by  MyChart link.  Patient verbalizes understanding of instructions and care plan provided today and agrees to view in MyChart. Active MyChart status and patient understanding of how to access instructions and care plan via MyChart confirmed with patient.     Telephone follow up appointment with Managed Medicaid care management team member scheduled for:02/07/23 at 1:15pm  [name] RN, BSN [place]  Value-Based Care Institute [facility] Health RN Care Coordinator [phone]   Following is a copy of your plan of care:  Care Plan : RN Care Manager Plan of Care  Updates made by [name], RN since 01/08/2023 12:00 AM     Problem: Healthcare needs related to DMII      Long-Range Goal: Development of Plan of Care to address Healthcare needs related to DMII   Start Date: 11/24/2022  Expected End Date: 02/22/2023  Note:   Current Barriers:  Chronic Disease Management support and education needs related to DMII Ms. Morozov is taking insulin daily, but is not checking her blood sugar. She has not received CGM. Ms. Ellinger has not scheduled an eye exam.  RNCM Clinical Goal(s):  Patient will take all medications exactly as prescribed and will call provider for medication related questions as evidenced by patient reports attend all scheduled medical appointments: [facility], 12/27 with Cardiology, 12/30 [facility] and 03/29/23 with Dr. Fransico Him as evidenced by provider documentation in EMR continue to work with RN Care Manager to address care management and care coordination needs related to  DMII as evidenced by adherence to CM Team Scheduled appointments work with social worker to address  related to the management of Limited access to food related to  the management of DMII as evidenced by review of EMR and patient or social worker report through collaboration with Medical illustrator, provider, and care team.   Interventions: Evaluation of current treatment  plan related to  self management and patient's adherence to plan as established by provider.    Diabetes Interventions:  (Status:  Goal on track:  Yes.) Long Term Goal Assessed patient's understanding of A1c goal: <7% Provided education to patient about basic DM disease process Reviewed medications with patient and discussed importance of medication adherence Counseled on importance of regular laboratory monitoring as prescribed Provided patient with written educational materials related to hypo and hyperglycemia and importance of correct treatment Reviewed scheduled/upcoming provider appointments including: [facility], 12/27 with Cardiology, 12/30 at the [facility] and 03/29/23 with Dr. [name](Endocrinology)  Referral made to social work team for assistance with local food pantries Review of patient status, including review of consultants reports, relevant laboratory and other test results, and medications completed Advised patient to schedule and eye exam with Ophthalmology-Provided with [facility] Ophthalmology [phone] [facility] with Pharmacist requesting update on [facility] Reader Advised patient to reschedule cancelled appointment with PCP-revisited Encouraged patient to work on improving her diet and exercising 30 minutes a day for 5 days a week Lab Results  Component Value Date   HGBA1C 10.9 (H) 08/04/2022    Patient Goals/Self-Care Activities: Take all medications as prescribed Attend all scheduled provider appointments Call provider office for new concerns or questions  Work with the social worker to address care coordination needs and will continue to work with the clinical team to address health care and disease management related needs schedule appointment with eye doctor drink 6 to 8 glasses of water each day fill half of plate with vegetables limit fast food meals to no more than 1 per week manage portion size  Follow Up Plan:   Telephone follow up appointment with care management team member scheduled for:  02/07/23 at 1:30pm

## 2023-01-09 DIAGNOSIS — D689 Coagulation defect, unspecified: Secondary | ICD-10-CM | POA: Diagnosis not present

## 2023-01-09 DIAGNOSIS — N186 End stage renal disease: Secondary | ICD-10-CM | POA: Diagnosis not present

## 2023-01-09 DIAGNOSIS — N2581 Secondary hyperparathyroidism of renal origin: Secondary | ICD-10-CM | POA: Diagnosis not present

## 2023-01-09 DIAGNOSIS — D509 Iron deficiency anemia, unspecified: Secondary | ICD-10-CM | POA: Diagnosis not present

## 2023-01-09 DIAGNOSIS — Z992 Dependence on renal dialysis: Secondary | ICD-10-CM | POA: Diagnosis not present

## 2023-01-10 ENCOUNTER — Encounter: Payer: Self-pay | Admitting: Pharmacist

## 2023-01-10 ENCOUNTER — Other Ambulatory Visit: Payer: Self-pay

## 2023-01-10 ENCOUNTER — Encounter: Payer: Self-pay | Admitting: Nurse Practitioner

## 2023-01-10 ENCOUNTER — Ambulatory Visit
Admission: EM | Admit: 2023-01-10 | Discharge: 2023-01-10 | Disposition: A | Discharge status: Home / Self Care | Payer: 59 | Attending: Nurse Practitioner | Admitting: Nurse Practitioner

## 2023-01-10 DIAGNOSIS — T50901A Poisoning by unspecified drugs, medicaments and biological substances, accidental (unintentional), initial encounter: Secondary | ICD-10-CM

## 2023-01-10 LAB — POCT FASTING CBG KUC MANUAL ENTRY: POCT Glucose (KUC): 171 mg/dL — AB (ref 70–99)

## 2023-01-10 MED ORDER — FREESTYLE LIBRE 3 SENSOR MISC: 6 refills | Status: DC

## 2023-01-10 MED ORDER — FREESTYLE LIBRE 3 READER DEVI
0 refills | Status: DC
Start: 2023-01-10 — End: 2023-10-22

## 2023-01-10 NOTE — Telephone Encounter (Signed)
Ana [name] states available without authorization [facility] sent for CGM to [facility]

## 2023-01-10 NOTE — ED Triage Notes (Signed)
Pt states she took her lasix and hydralazine at 1400 and 1800 by accident.

## 2023-01-10 NOTE — ED Provider Notes (Signed)
[facility]    CSN: [id number] Arrival date & time: 01/10/23  1937      History   Chief Complaint Chief Complaint  Patient presents with   Blood Pressure Check    HPI Ana [name] is a 52 y.o. female.   The history is provided by the patient.   Patient presents with her daughter for concern for taking an extra dose of her Lasix and hydralazine.  Patient reports she supposed to take her Lasix 80 mg twice daily and hydralazine 100 mg 3 times daily.  She reports that she took a dose of the medications this morning, took a dose around 2 PM today, and then took a third dose at 6 PM this evening.  Patient currently denies chest pain, shortness of breath, difficulty breathing, abdominal pain, nausea, vomiting, or diarrhea.  Patient does state however "I do not feel right."  Patient's daughter reports patient is acting like herself.  She does report the patient does not act like herself when she is sick or has a low blood glucose level.  Patient is a dialysis patient, she is scheduled for her next dialysis on tomorrow.  Patient reports that she did take her insulin earlier today around 11 AM.  States that she has eaten apple since that time.    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) 12/21/2021   Hypothyroidism, adult 09/02/2020   Vitamin D deficiency disease 09/02/2020   Type 2 diabetes mellitus with hyperlipidemia (HCC) 09/26/2017   Essential hypertension, benign 09/26/2017   Mixed hyperlipidemia 09/26/2017    Past Surgical History:  Procedure Laterality Date   AV FISTULA PLACEMENT Left 09/05/2022   Procedure: LEFT BRACHIO BASILIC ARTERIOVENOUS (AV) FISTULA CREATION;  Surgeon: Victorino Sparrow, MD;  Location: [facility] OR;   Service: Vascular;  Laterality: Left;   BASCILIC VEIN TRANSPOSITION Left 11/13/2022   Procedure: LEFT ARM SECOND STAGE BASILIC VEIN TRANSPOSITION;  Surgeon: Victorino Sparrow, MD;  Location: [facility] OR;  Service: Vascular;  Laterality: Left;   CESAREAN SECTION  x4   CHOLECYSTECTOMY     EYE SURGERY     INSERTION OF DIALYSIS CATHETER Right 09/05/2022   Procedure: INSERTION OF TUNNELED DIALYSIS CATHETER RIGHT INTERNAL JUIGULAR VEIN;  Surgeon: Victorino Sparrow, MD;  Location[facility] OR;  Service: Vascular;  Laterality: Right;  lma   MASS EXCISION  10/27/2011   Procedure: EXCISION MASS;  Surgeon: Fabio Bering, MD;  Location: [facility];  Service: General;  Laterality: Right;   TUBAL LIGATION      OB History     Gravida  6   Para  5   Term      Preterm  5   AB  1   Living         SAB  1   IAB      Ectopic      Multiple      Live Births               Home Medications    Prior to Admission medications   Medication Sig Start Date End Date Taking? Authorizing Provider  amLODipine (NORVASC) 10 MG tablet TAKE 1 TABLET BY MOUTH DAILY Patient taking differently: Take 10 mg by mouth  daily. 12/25/21   Erick Blinks, MD  Blood Glucose Monitoring Suppl DEVI Use up to 4 times daily to check blood sugar. May substitute to any manufacturer covered by patient's insurance. Patient not taking: Reported on 01/08/2023 11/01/22   [name], DO  calcitRIOL (ROCALTROL) 0.25 MCG capsule Take 0.25 mcg by mouth daily. 03/03/22 03/03/23  [provider]  carvedilol (COREG) 25 MG tablet Take 1 tablet (25 mg total) by mouth 2 (two) times daily. 08/23/22 08/18/23  Ellsworth Lennox, PA-C  Continuous Glucose Receiver (FREESTYLE LIBRE 3 READER) DEVI Use to check blood sugar via CGM; DX:E11.9 01/10/23   Cook, Jayce G, DO  Continuous Glucose Sensor (FREESTYLE LIBRE 3 SENSOR) MISC Place 1 sensor on the skin every 14 days. Use to check glucose continuously 01/10/23   [name], DO  furosemide (LASIX)  40 MG tablet Take 80 mg by mouth 2 (two) times daily.    [provider]  Glucose Blood (BLOOD GLUCOSE TEST STRIPS) STRP Use up to 4 times daily to check blood sugar. May substitute to any manufacturer covered by patient's insurance. Patient not taking: Reported on 01/08/2023 11/01/22   [name], DO  hydrALAZINE (APRESOLINE) 100 MG tablet Take 1 tablet (100 mg total) by mouth 3 (three) times daily. 08/23/22 04/20/23  Iran Ouch, Lennart Pall, PA-C  insulin glargine (LANTUS) 100 UNIT/ML Solostar Pen Inject 10 Units into the skin daily. 09/22/22   [name], DO  Insulin Pen Needle 31G X 4 MM MISC Use to administer insulin daily. 09/22/22   [name], DO  Lancet Device MISC Use up to 4 times daily to check blood sugar. May substitute to any manufacturer covered by patient's insurance. 11/01/22   [name], DO  Lancets Misc. MISC Use up to 4 times daily to check blood sugar. May substitute to any manufacturer covered by patient's insurance. Patient not taking: Reported on 01/08/2023 11/01/22   [name], DO  levothyroxine (SYNTHROID) 50 MCG tablet TAKE 1 TABLET(50 MCG) BY MOUTH EVERY DAY 08/22/22   [name], DO  oxyCODONE-acetaminophen (PERCOCET) 5-325 MG tablet Take 1 tablet by mouth every 6 (six) hours as needed. Patient not taking: Reported on 11/24/2022 11/13/22   Dara Lords, PA-C  rosuvastatin (CRESTOR) 40 MG tablet Take 1 tablet (40 mg total) by mouth daily with supper. 12/25/21 01/08/23  Erick Blinks, MD  sodium bicarbonate 650 MG tablet Take 650 mg by mouth 2 (two) times daily.    [provider]    Family History Family History  Problem Relation Age of Onset   Kidney failure Mother    Diabetes Mother    Kidney disease Mother    Kidney failure Father    Kidney disease Father    Kidney failure Brother    Diabetes Brother    Diabetes Brother     Social History Social History   Tobacco Use   Smoking status: Never   Smokeless tobacco: Never   Vaping Use   Vaping status: Never Used  Substance Use Topics   Alcohol use: Not Currently    Comment: occ   Drug use: No     Allergies   Ranitidine hcl   Review of Systems Review of Systems Per HPI  Physical Exam Triage Vital Signs ED Triage Vitals  Encounter Vitals Group     BP 01/10/23 1945 (!) 148/79     Systolic BP Percentile --      Diastolic BP Percentile --  Pulse Rate 01/10/23 1945 76     Resp 01/10/23 1945 16     Temp 01/10/23 1945 98 F (36.7 C)     Temp Source 01/10/23 1945 Oral     SpO2 01/10/23 1945 97 %     Weight --      Height --      Head Circumference --      Peak Flow --      Pain Score 01/10/23 1946 0     Pain Loc --      Pain Education --      Exclude from Growth Chart --    No data found.  Updated Vital Signs BP (!) 148/79 (BP Location: Right Arm)   Pulse 76   Temp 98 F (36.7 C) (Oral)   Resp 16   LMP 09/22/2019 (Approximate)   SpO2 97%   Visual Acuity Right Eye Distance:   Left Eye Distance:   Bilateral Distance:    Right Eye Near:   Left Eye Near:    Bilateral Near:     Physical Exam Vitals and nursing note reviewed.  Constitutional:      General: She is not in acute distress.    Appearance: Normal appearance.  HENT:     Head: Normocephalic.  Eyes:     Extraocular Movements: Extraocular movements intact.     Pupils: Pupils are equal, round, and reactive to light.  Cardiovascular:     Rate and Rhythm: Normal rate and regular rhythm.     Pulses: Normal pulses.     Heart sounds: Normal heart sounds.  Pulmonary:     Effort: Pulmonary effort is normal. No respiratory distress.     Breath sounds: Normal breath sounds. No stridor. No wheezing, rhonchi or rales.  Abdominal:     General: Bowel sounds are normal.     Palpations: Abdomen is soft.     Tenderness: There is no abdominal tenderness.  Musculoskeletal:     Cervical back: Normal range of motion.  Skin:    General: Skin is warm and dry.  Neurological:      General: No focal deficit present.     Mental Status: She is alert and oriented to person, place, and time.  Psychiatric:        Mood and Affect: Mood normal.        Behavior: Behavior normal.    Poison control was called regarding patient's additional dosing of medication.  Spoke with Caryn Bee with poison control.  Given advised patient can increase her fluid, per restrictions given that she is a dialysis patient, and to be monitored at home by family at least until 12 AM this evening.  Given also recommended that patient go to the emergency department if she experiences symptoms of difficulty breathing, shortness of breath, throat swelling, tongue swelling, or severe chest pain.  UC Treatments / Results  Labs (all labs ordered are listed, but only abnormal results are displayed) Labs Reviewed  POCT FASTING CBG [facility] MANUAL ENTRY - Abnormal; Notable for the following components:      Result Value   POCT Glucose ([facility]) 171 (*)    All other components within normal limits    EKG: Normal sinus rhythm, no STEMI.  Compared to EKGs dated 03/18/2022, 12/22/2021, and 12/21/2021.   Radiology No results found.  Procedures Procedures (including critical care time)  Medications Ordered in UC Medications - No data to display  Initial Impression / Assessment and Plan / UC Course  I have  reviewed the triage vital signs and the nursing notes.  Pertinent labs & imaging results that were available during my care of the patient were reviewed by me and considered in my medical decision making (see chart for details).  On exam, patient's vital signs are stable, EKG normal sinus rhythm, no changes when reviewed against previous EKGs.  Fingerstick blood glucose 171.  Patient reports that she took medication approximately 2 hours ago, do not suspect allergic reaction or anaphylaxis.  Patient and her daughter were advised regarding phone call with poison control.  Daughter was advised to monitor her mother for  worsening symptoms.  Patient advised to attend dialysis on 11/14 as scheduled.  Patient and daughter were also advised of indications of when to follow-up in the emergency department.  Patient and daughter verbalized understanding, all questions were answered.  Patient is stable for discharge. Final Clinical Impressions(s) / UC Diagnoses   Final diagnoses:  Medication overdose, accidental or unintentional, initial encounter     Discharge Instructions      Spoke with Caryn Bee with poison control.  He recommended increasing your fluid intake if there are no fluid restrictions, and to continue to be monitored with family until at least 12 PM tonight. Go to the emergency department if you experience dizziness, lightheadedness, chest pain, shortness of breath, or other concerns. Recommend dialysis as scheduled. Follow-up as needed.     ED Prescriptions   None    PDMP not reviewed this encounter.   Abran Cantor, NP 01/11/23 1312

## 2023-01-10 NOTE — Discharge Instructions (Addendum)
Spoke with Caryn Bee with poison control.  He recommended increasing your fluid intake if there are no fluid restrictions, and to continue to be monitored with family until at least 12 PM tonight. Go to the emergency department if you experience dizziness, lightheadedness, chest pain, shortness of breath, or other concerns. Recommend dialysis as scheduled. Follow-up as needed.

## 2023-01-10 NOTE — Addendum Note (Signed)
Addended by: Vanice Sarah D on: 01/10/2023 02:46 PM   Modules accepted: Orders

## 2023-01-11 DIAGNOSIS — N186 End stage renal disease: Secondary | ICD-10-CM | POA: Diagnosis not present

## 2023-01-11 DIAGNOSIS — N2581 Secondary hyperparathyroidism of renal origin: Secondary | ICD-10-CM | POA: Diagnosis not present

## 2023-01-11 DIAGNOSIS — D689 Coagulation defect, unspecified: Secondary | ICD-10-CM | POA: Diagnosis not present

## 2023-01-11 DIAGNOSIS — Z992 Dependence on renal dialysis: Secondary | ICD-10-CM | POA: Diagnosis not present

## 2023-01-11 DIAGNOSIS — D509 Iron deficiency anemia, unspecified: Secondary | ICD-10-CM | POA: Diagnosis not present

## 2023-01-13 DIAGNOSIS — Z992 Dependence on renal dialysis: Secondary | ICD-10-CM | POA: Diagnosis not present

## 2023-01-13 DIAGNOSIS — N186 End stage renal disease: Secondary | ICD-10-CM | POA: Diagnosis not present

## 2023-01-13 DIAGNOSIS — D509 Iron deficiency anemia, unspecified: Secondary | ICD-10-CM | POA: Diagnosis not present

## 2023-01-13 DIAGNOSIS — D689 Coagulation defect, unspecified: Secondary | ICD-10-CM | POA: Diagnosis not present

## 2023-01-13 DIAGNOSIS — N2581 Secondary hyperparathyroidism of renal origin: Secondary | ICD-10-CM | POA: Diagnosis not present

## 2023-01-15 ENCOUNTER — Other Ambulatory Visit (HOSPITAL_COMMUNITY): Payer: Self-pay | Admitting: Nephrology

## 2023-01-15 DIAGNOSIS — E039 Hypothyroidism, unspecified: Secondary | ICD-10-CM | POA: Diagnosis not present

## 2023-01-15 DIAGNOSIS — Z992 Dependence on renal dialysis: Secondary | ICD-10-CM | POA: Diagnosis not present

## 2023-01-15 DIAGNOSIS — N186 End stage renal disease: Secondary | ICD-10-CM | POA: Diagnosis not present

## 2023-01-15 DIAGNOSIS — N2581 Secondary hyperparathyroidism of renal origin: Secondary | ICD-10-CM | POA: Diagnosis not present

## 2023-01-15 DIAGNOSIS — Z23 Encounter for immunization: Secondary | ICD-10-CM | POA: Diagnosis not present

## 2023-01-15 DIAGNOSIS — E1122 Type 2 diabetes mellitus with diabetic chronic kidney disease: Secondary | ICD-10-CM | POA: Diagnosis not present

## 2023-01-15 DIAGNOSIS — D689 Coagulation defect, unspecified: Secondary | ICD-10-CM | POA: Diagnosis not present

## 2023-01-18 ENCOUNTER — Ambulatory Visit (HOSPITAL_COMMUNITY): Payer: 59

## 2023-01-18 DIAGNOSIS — E039 Hypothyroidism, unspecified: Secondary | ICD-10-CM | POA: Diagnosis not present

## 2023-01-18 DIAGNOSIS — Z23 Encounter for immunization: Secondary | ICD-10-CM | POA: Diagnosis not present

## 2023-01-18 DIAGNOSIS — N186 End stage renal disease: Secondary | ICD-10-CM | POA: Diagnosis not present

## 2023-01-18 DIAGNOSIS — N2581 Secondary hyperparathyroidism of renal origin: Secondary | ICD-10-CM | POA: Diagnosis not present

## 2023-01-18 DIAGNOSIS — Z992 Dependence on renal dialysis: Secondary | ICD-10-CM | POA: Diagnosis not present

## 2023-01-18 DIAGNOSIS — E1122 Type 2 diabetes mellitus with diabetic chronic kidney disease: Secondary | ICD-10-CM | POA: Diagnosis not present

## 2023-01-18 DIAGNOSIS — D689 Coagulation defect, unspecified: Secondary | ICD-10-CM | POA: Diagnosis not present

## 2023-01-19 ENCOUNTER — Ambulatory Visit (HOSPITAL_COMMUNITY)
Admission: RE | Admit: 2023-01-19 | Discharge: 2023-01-19 | Disposition: A | Discharge status: Home / Self Care | Payer: 59 | Source: Ambulatory Visit | Attending: Nephrology | Admitting: Nephrology

## 2023-01-19 DIAGNOSIS — N186 End stage renal disease: Secondary | ICD-10-CM | POA: Insufficient documentation

## 2023-01-19 DIAGNOSIS — Z452 Encounter for adjustment and management of vascular access device: Secondary | ICD-10-CM | POA: Diagnosis not present

## 2023-01-19 DIAGNOSIS — Z4901 Encounter for fitting and adjustment of extracorporeal dialysis catheter: Secondary | ICD-10-CM | POA: Diagnosis not present

## 2023-01-19 HISTORY — PX: IR REMOVAL TUN CV CATH W/O FL: IMG2289

## 2023-01-19 MED ORDER — CHLORHEXIDINE GLUCONATE 4 % EX SOLN
CUTANEOUS | Status: AC
  Filled 2023-01-19: qty 15

## 2023-01-19 MED ORDER — LIDOCAINE HCL 1 % IJ SOLN
INTRAMUSCULAR | Status: AC
  Filled 2023-01-19: qty 20

## 2023-01-20 DIAGNOSIS — E039 Hypothyroidism, unspecified: Secondary | ICD-10-CM | POA: Diagnosis not present

## 2023-01-20 DIAGNOSIS — E1122 Type 2 diabetes mellitus with diabetic chronic kidney disease: Secondary | ICD-10-CM | POA: Diagnosis not present

## 2023-01-20 DIAGNOSIS — Z23 Encounter for immunization: Secondary | ICD-10-CM | POA: Diagnosis not present

## 2023-01-20 DIAGNOSIS — Z992 Dependence on renal dialysis: Secondary | ICD-10-CM | POA: Diagnosis not present

## 2023-01-20 DIAGNOSIS — D689 Coagulation defect, unspecified: Secondary | ICD-10-CM | POA: Diagnosis not present

## 2023-01-20 DIAGNOSIS — N186 End stage renal disease: Secondary | ICD-10-CM | POA: Diagnosis not present

## 2023-01-20 DIAGNOSIS — N2581 Secondary hyperparathyroidism of renal origin: Secondary | ICD-10-CM | POA: Diagnosis not present

## 2023-01-28 DIAGNOSIS — N186 End stage renal disease: Secondary | ICD-10-CM | POA: Diagnosis not present

## 2023-01-28 DIAGNOSIS — Z992 Dependence on renal dialysis: Secondary | ICD-10-CM | POA: Diagnosis not present

## 2023-02-03 ENCOUNTER — Other Ambulatory Visit: Payer: Self-pay | Admitting: Family Medicine

## 2023-02-07 ENCOUNTER — Other Ambulatory Visit: Payer: Self-pay | Admitting: *Deleted

## 2023-02-07 NOTE — Patient Outreach (Signed)
  Medicaid Managed Care   Unsuccessful Attempt Note   02/07/2023 Name: Ana [name] MRN: [id number] DOB: 10/30/1970  Referred by: [name], DO Reason for referral : High Risk Managed Medicaid (Unsuccessful RNCM follow up telephone outreach)   An unsuccessful telephone outreach was attempted today. The patient was referred to the case management team for assistance with care management and care coordination.    Follow Up Plan: The Managed Medicaid care management team will reach out to the patient again over the next 7 days.    [name] RN, BSN [place]  Value-Based Care Institute [facility] Health RN Care Coordinator [phone]

## 2023-02-07 NOTE — Patient Instructions (Signed)
Visit Information  Ms. Elora D Walls  - as a part of your Medicaid benefit, you are eligible for care management and care coordination services at no cost or copay. I was unable to reach you by phone today but would be happy to help you with your health related needs. Please feel free to call me @ [phone].   A member of the Managed Medicaid care management team will reach out to you again over the next 7 days.   [name] RN, BSN [place]  Value-Based Care Institute [facility] Health RN Care Coordinator [phone]

## 2023-02-09 ENCOUNTER — Telehealth: Payer: Self-pay

## 2023-02-09 NOTE — Telephone Encounter (Signed)
Reason for CRM: PT states Dr. Adriana Simas changed her dosage for her furosemide (LASIX) 40 MG tablet to be taken as 80 mg by mouth 2 (two) times daily. PT and pharmacy states her prescription does not reelect this dosage amount. Please re submit a 90 day supply to [id number] - [place], [place] - [address]    [address] [place] [address]. PT states she is out of this medication.  Did not see a note to reflect this, Please advise

## 2023-02-09 NOTE — Telephone Encounter (Signed)
..   Medicaid Managed Care   Unsuccessful Outreach Note  02/09/2023 Name: Ana [name] MRN: [id number] DOB: May 19, 1970  Referred by: [name], DO Reason for referral : Appointment (I called the patient today to get her missed phone appt with the MM RNCM rescheduled. SHe did not answer and I was not able to leave a message.)   A second unsuccessful telephone outreach was attempted today. The patient was referred to the case management team for assistance with care management and care coordination.   Follow Up Plan: The care management team will reach out to the patient again over the next 7 days.   Weston Settle Care Guide  [facility] Managed  Care Guide [facility]  [phone]

## 2023-02-12 ENCOUNTER — Other Ambulatory Visit: Payer: Self-pay | Admitting: Family Medicine

## 2023-02-12 MED ORDER — FUROSEMIDE 40 MG PO TABS: 80.0000 mg | ORAL_TABLET | Freq: Two times a day (BID) | ORAL | 0 refills | Status: AC

## 2023-02-12 NOTE — Telephone Encounter (Signed)
Copied from CRM [id number]. Topic: Clinical - Medication Refill >> Feb 12, 2023  3:46 PM Maxwell Marion wrote: Most Recent Primary Care Visit:  Provider: [name]  Department: [facility]  Visit Type: OFFICE VISIT  Date: 11/01/2022  Medication: furosemide (LASIX) 40 MG tablet  Has the patient contacted their pharmacy? No, patient was told to contact her doctor for refill (Agent: If no, request that the patient contact the pharmacy for the refill. If patient does not wish to contact the pharmacy document the reason why and proceed with request.) (Agent: If yes, when and what did the pharmacy advise?)  Is this the correct pharmacy for this prescription? Yes If no, delete pharmacy and type the correct one.  This is the patient's preferred pharmacy:  [id number] - [place], [place] - [address]  [place] [address] Phone: [phone] Fax: [phone]   Has the prescription been filled recently? Yes  Is the patient out of the medication? No  Has the patient been seen for an appointment in the last year OR does the patient have an upcoming appointment?   Can we respond through MyChart?   Agent: Please be advised that Rx refills may take up to 3 business days. We ask that you follow-up with your pharmacy.

## 2023-02-12 NOTE — Telephone Encounter (Signed)
Ana Sams, DO     Needs to be prescribed by nephrology given ESRD.

## 2023-02-13 ENCOUNTER — Other Ambulatory Visit: Payer: Self-pay | Admitting: Family Medicine

## 2023-02-13 MED ORDER — INSULIN GLARGINE 100 UNIT/ML SOLOSTAR PEN: 10.0000 [IU] | PEN_INJECTOR | Freq: Every day | SUBCUTANEOUS | 1 refills | Status: DC

## 2023-02-13 NOTE — Telephone Encounter (Signed)
Patient states she spoke with her kidney Dr during dialysis and handled that but needs refills on he insulin shots

## 2023-02-13 NOTE — Telephone Encounter (Signed)
[name], DO     Rx sent. Needs to see me.

## 2023-02-22 NOTE — Telephone Encounter (Signed)
Contacted pt and informed her that the Lasix needs to be Prescribed by Nephrology due to ESRD, Pt verbalized understanding

## 2023-02-23 ENCOUNTER — Ambulatory Visit: Payer: Medicaid Other | Admitting: Internal Medicine

## 2023-02-28 DIAGNOSIS — Z992 Dependence on renal dialysis: Secondary | ICD-10-CM | POA: Diagnosis not present

## 2023-02-28 DIAGNOSIS — N186 End stage renal disease: Secondary | ICD-10-CM | POA: Diagnosis not present

## 2023-03-01 ENCOUNTER — Other Ambulatory Visit: Payer: Self-pay | Admitting: *Deleted

## 2023-03-01 ENCOUNTER — Telehealth: Payer: Self-pay

## 2023-03-01 NOTE — Progress Notes (Signed)
..   Medicaid Managed Care   Unsuccessful Outreach Note  03/01/2023 Name: Ana [name] MRN: [id number] DOB: December 13, 1970  Referred by: Cook, Jayce G, DO Reason for referral : High Risk Managed Medicaid   Third unsuccessful telephone outreach was attempted today. The patient was referred to the case management team for assistance with care management and care coordination. The patient's primary care provider has been notified of our unsuccessful attempts to make or maintain contact with the patient. The care management team is pleased to engage with this patient at any time in the future should he/she be interested in assistance from the care management team.   Follow Up Plan: We have been unable to make contact with the patient for follow up. The care management team is available to follow up with the patient after provider conversation with the patient regarding recommendation for care management engagement and subsequent re-referral to the care management team.   [name] Care Guide  [facility] Managed  Care Guide [facility] Health  [phone]

## 2023-03-07 ENCOUNTER — Other Ambulatory Visit: Payer: Self-pay | Admitting: Family Medicine

## 2023-03-07 NOTE — Telephone Encounter (Signed)
 Copied from CRM [id number]. Topic: Clinical - Medication Refill >> Mar 07, 2023 11:44 AM Chase BROCKS wrote: Most Recent Primary Care Visit:  Provider: BLUFORD JACQULYN MATSU  Department: [facility]  Visit Type: OFFICE VISIT  Date: 11/01/2022  Medication: Insulin  Pen Needle 31G X 4 MM MISC  Has the patient contacted their pharmacy? Yes (Agent: If no, request that the patient contact the pharmacy for the refill. If patient does not wish to contact the pharmacy document the reason why and proceed with request.) (Agent: If yes, when and what did the pharmacy advise?)  Is this the correct pharmacy for this prescription? Yes If no, delete pharmacy and type the correct one.  This is the patient's preferred pharmacy:  [facility] [address] [place], [place] [address] Phone: [phone]   Has the prescription been filled recently? Yes  Is the patient out of the medication? Yes  Has the patient been seen for an appointment in the last year OR does the patient have an upcoming appointment? Yes  Can we respond through MyChart? Yes  Agent: Please be advised that Rx refills may take up to 3 business days. We ask that you follow-up with your pharmacy.

## 2023-03-08 ENCOUNTER — Other Ambulatory Visit: Payer: Self-pay | Admitting: *Deleted

## 2023-03-08 NOTE — Patient Outreach (Signed)
 Care Coordination  03/08/2023  Ana [name] 11-22-1970 [id number]  Error   [name] RN, BSN   [facility] RN Care Coordinator [phone]

## 2023-03-16 ENCOUNTER — Ambulatory Visit (HOSPITAL_COMMUNITY): Admission: RE | Admit: 2023-03-16 | Payer: 59 | Source: Ambulatory Visit | Admitting: Nephrology

## 2023-03-16 ENCOUNTER — Encounter (HOSPITAL_COMMUNITY): Admission: RE | Payer: Self-pay | Source: Ambulatory Visit

## 2023-03-16 SURGERY — A/V FISTULAGRAM
Anesthesia: LOCAL

## 2023-03-29 ENCOUNTER — Ambulatory Visit: Payer: 59 | Admitting: "Endocrinology

## 2023-03-31 DIAGNOSIS — D689 Coagulation defect, unspecified: Secondary | ICD-10-CM | POA: Diagnosis not present

## 2023-03-31 DIAGNOSIS — Z992 Dependence on renal dialysis: Secondary | ICD-10-CM | POA: Diagnosis not present

## 2023-03-31 DIAGNOSIS — N2581 Secondary hyperparathyroidism of renal origin: Secondary | ICD-10-CM | POA: Diagnosis not present

## 2023-03-31 DIAGNOSIS — N186 End stage renal disease: Secondary | ICD-10-CM | POA: Diagnosis not present

## 2023-04-02 ENCOUNTER — Encounter (HOSPITAL_COMMUNITY): Admission: RE | Payer: Self-pay | Source: Ambulatory Visit

## 2023-04-02 ENCOUNTER — Ambulatory Visit (HOSPITAL_COMMUNITY): Admission: RE | Admit: 2023-04-02 | Payer: 59 | Source: Ambulatory Visit | Admitting: Internal Medicine

## 2023-04-02 SURGERY — A/V FISTULAGRAM
Anesthesia: LOCAL

## 2023-04-05 DIAGNOSIS — D631 Anemia in chronic kidney disease: Secondary | ICD-10-CM | POA: Diagnosis not present

## 2023-04-05 DIAGNOSIS — N2581 Secondary hyperparathyroidism of renal origin: Secondary | ICD-10-CM | POA: Diagnosis not present

## 2023-04-05 DIAGNOSIS — Z992 Dependence on renal dialysis: Secondary | ICD-10-CM | POA: Diagnosis not present

## 2023-04-05 DIAGNOSIS — N186 End stage renal disease: Secondary | ICD-10-CM | POA: Diagnosis not present

## 2023-04-05 DIAGNOSIS — D689 Coagulation defect, unspecified: Secondary | ICD-10-CM | POA: Diagnosis not present

## 2023-04-07 DIAGNOSIS — D631 Anemia in chronic kidney disease: Secondary | ICD-10-CM | POA: Diagnosis not present

## 2023-04-07 DIAGNOSIS — Z992 Dependence on renal dialysis: Secondary | ICD-10-CM | POA: Diagnosis not present

## 2023-04-07 DIAGNOSIS — N186 End stage renal disease: Secondary | ICD-10-CM | POA: Diagnosis not present

## 2023-04-07 DIAGNOSIS — D689 Coagulation defect, unspecified: Secondary | ICD-10-CM | POA: Diagnosis not present

## 2023-04-07 DIAGNOSIS — N2581 Secondary hyperparathyroidism of renal origin: Secondary | ICD-10-CM | POA: Diagnosis not present

## 2023-04-12 DIAGNOSIS — Z992 Dependence on renal dialysis: Secondary | ICD-10-CM | POA: Diagnosis not present

## 2023-04-12 DIAGNOSIS — D689 Coagulation defect, unspecified: Secondary | ICD-10-CM | POA: Diagnosis not present

## 2023-04-12 DIAGNOSIS — N186 End stage renal disease: Secondary | ICD-10-CM | POA: Diagnosis not present

## 2023-04-12 DIAGNOSIS — N2581 Secondary hyperparathyroidism of renal origin: Secondary | ICD-10-CM | POA: Diagnosis not present

## 2023-04-14 DIAGNOSIS — N186 End stage renal disease: Secondary | ICD-10-CM | POA: Diagnosis not present

## 2023-04-14 DIAGNOSIS — N2581 Secondary hyperparathyroidism of renal origin: Secondary | ICD-10-CM | POA: Diagnosis not present

## 2023-04-14 DIAGNOSIS — Z992 Dependence on renal dialysis: Secondary | ICD-10-CM | POA: Diagnosis not present

## 2023-04-14 DIAGNOSIS — D689 Coagulation defect, unspecified: Secondary | ICD-10-CM | POA: Diagnosis not present

## 2023-04-17 DIAGNOSIS — N2581 Secondary hyperparathyroidism of renal origin: Secondary | ICD-10-CM | POA: Diagnosis not present

## 2023-04-17 DIAGNOSIS — E1122 Type 2 diabetes mellitus with diabetic chronic kidney disease: Secondary | ICD-10-CM | POA: Diagnosis not present

## 2023-04-17 DIAGNOSIS — D689 Coagulation defect, unspecified: Secondary | ICD-10-CM | POA: Diagnosis not present

## 2023-04-17 DIAGNOSIS — D631 Anemia in chronic kidney disease: Secondary | ICD-10-CM | POA: Diagnosis not present

## 2023-04-17 DIAGNOSIS — N186 End stage renal disease: Secondary | ICD-10-CM | POA: Diagnosis not present

## 2023-04-17 DIAGNOSIS — Z992 Dependence on renal dialysis: Secondary | ICD-10-CM | POA: Diagnosis not present

## 2023-04-17 DIAGNOSIS — E039 Hypothyroidism, unspecified: Secondary | ICD-10-CM | POA: Diagnosis not present

## 2023-04-19 DIAGNOSIS — N186 End stage renal disease: Secondary | ICD-10-CM | POA: Diagnosis not present

## 2023-04-19 DIAGNOSIS — N2581 Secondary hyperparathyroidism of renal origin: Secondary | ICD-10-CM | POA: Diagnosis not present

## 2023-04-19 DIAGNOSIS — D689 Coagulation defect, unspecified: Secondary | ICD-10-CM | POA: Diagnosis not present

## 2023-04-19 DIAGNOSIS — Z992 Dependence on renal dialysis: Secondary | ICD-10-CM | POA: Diagnosis not present

## 2023-04-19 DIAGNOSIS — E039 Hypothyroidism, unspecified: Secondary | ICD-10-CM | POA: Diagnosis not present

## 2023-04-19 DIAGNOSIS — D631 Anemia in chronic kidney disease: Secondary | ICD-10-CM | POA: Diagnosis not present

## 2023-04-19 DIAGNOSIS — E1122 Type 2 diabetes mellitus with diabetic chronic kidney disease: Secondary | ICD-10-CM | POA: Diagnosis not present

## 2023-04-21 DIAGNOSIS — D631 Anemia in chronic kidney disease: Secondary | ICD-10-CM | POA: Diagnosis not present

## 2023-04-21 DIAGNOSIS — D689 Coagulation defect, unspecified: Secondary | ICD-10-CM | POA: Diagnosis not present

## 2023-04-21 DIAGNOSIS — Z992 Dependence on renal dialysis: Secondary | ICD-10-CM | POA: Diagnosis not present

## 2023-04-21 DIAGNOSIS — E039 Hypothyroidism, unspecified: Secondary | ICD-10-CM | POA: Diagnosis not present

## 2023-04-21 DIAGNOSIS — N2581 Secondary hyperparathyroidism of renal origin: Secondary | ICD-10-CM | POA: Diagnosis not present

## 2023-04-21 DIAGNOSIS — N186 End stage renal disease: Secondary | ICD-10-CM | POA: Diagnosis not present

## 2023-04-21 DIAGNOSIS — E1122 Type 2 diabetes mellitus with diabetic chronic kidney disease: Secondary | ICD-10-CM | POA: Diagnosis not present

## 2023-04-24 DIAGNOSIS — N186 End stage renal disease: Secondary | ICD-10-CM | POA: Diagnosis not present

## 2023-04-24 DIAGNOSIS — D509 Iron deficiency anemia, unspecified: Secondary | ICD-10-CM | POA: Diagnosis not present

## 2023-04-24 DIAGNOSIS — N2581 Secondary hyperparathyroidism of renal origin: Secondary | ICD-10-CM | POA: Diagnosis not present

## 2023-04-24 DIAGNOSIS — D689 Coagulation defect, unspecified: Secondary | ICD-10-CM | POA: Diagnosis not present

## 2023-04-24 DIAGNOSIS — Z992 Dependence on renal dialysis: Secondary | ICD-10-CM | POA: Diagnosis not present

## 2023-04-26 DIAGNOSIS — N186 End stage renal disease: Secondary | ICD-10-CM | POA: Diagnosis not present

## 2023-04-26 DIAGNOSIS — D509 Iron deficiency anemia, unspecified: Secondary | ICD-10-CM | POA: Diagnosis not present

## 2023-04-26 DIAGNOSIS — N2581 Secondary hyperparathyroidism of renal origin: Secondary | ICD-10-CM | POA: Diagnosis not present

## 2023-04-26 DIAGNOSIS — D689 Coagulation defect, unspecified: Secondary | ICD-10-CM | POA: Diagnosis not present

## 2023-04-26 DIAGNOSIS — Z992 Dependence on renal dialysis: Secondary | ICD-10-CM | POA: Diagnosis not present

## 2023-04-26 NOTE — Progress Notes (Signed)
 Cardiology Office Note   Date:  05/02/2023   ID:  Ana [name], DOB 1971/02/15, MRN [id number]  PCP:  [name], DO  Cardiologist:   Dietrich Pates, MD   Pt returns for follow up of  HTN      History of Present Illness: Ana [name] is a 53 y.o. female  with a hx of HTN, DM, hyothyoridism, LE edema and chest tightness.  Echo in 2022 showed moderate LVH and vigorous LV function   Also small/moderate pericardial effusion  Chest tightness improved with Rx with lasix  I saw the pt in 2023    Sine seen she says her breathing is OK   She denies CP No palpitations  Goes to dialysis T, Th, S  Sessions going OK   No dizziness  Diet:  Burgers, Some fries  DOes  drink sprite, ginger ale    Current Meds  Medication Sig   amLODipine (NORVASC) 10 MG tablet TAKE 1 TABLET BY MOUTH DAILY (Patient taking differently: Take 10 mg by mouth daily.)   calcitRIOL (ROCALTROL) 0.25 MCG capsule Take 0.25 mcg by mouth daily.   carvedilol (COREG) 25 MG tablet Take 1 tablet (25 mg total) by mouth 2 (two) times daily.   Continuous Glucose Receiver (FREESTYLE LIBRE 3 READER) DEVI Use to check blood sugar via CGM; DX:E11.9   Continuous Glucose Sensor (FREESTYLE LIBRE 3 SENSOR) MISC Place 1 sensor on the skin every 14 days. Use to check glucose continuously   furosemide (LASIX) 40 MG tablet Take 2 tablets (80 mg total) by mouth 2 (two) times daily.   hydrALAZINE (APRESOLINE) 100 MG tablet Take 1 tablet (100 mg total) by mouth 3 (three) times daily.   insulin glargine (LANTUS) 100 UNIT/ML Solostar Pen Inject 10 Units into the skin daily.   Insulin Pen Needle 31G X 4 MM MISC Use to administer insulin daily.   Lancet Device MISC Use up to 4 times daily to check blood sugar. May substitute to any manufacturer covered by patient's insurance.   levothyroxine (SYNTHROID) 50 MCG tablet TAKE 1 TABLET(50 MCG) BY MOUTH EVERY DAY   lidocaine-prilocaine (EMLA) cream Apply 1 Application topically daily as needed.    rosuvastatin (CRESTOR) 40 MG tablet Take 1 tablet (40 mg total) by mouth daily with supper.     Allergies:   Ranitidine hcl   Past Medical History:  Diagnosis Date   (HFpEF) heart failure with preserved ejection fraction (HCC)    a. echo in 10/2021 showing preserved EF of 60-65% with Grade 2 DD and small pericardial effusion.   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   Diabetes mellitus    type 2, diet controlled   Hypertension     Past Surgical History:  Procedure Laterality Date   AV FISTULA PLACEMENT Left 09/05/2022   Procedure: LEFT BRACHIO BASILIC ARTERIOVENOUS (AV) FISTULA CREATION;  Surgeon: Victorino Sparrow, MD;  Location: [facility] OR;  Service: Vascular;  Laterality: Left;   BASCILIC VEIN TRANSPOSITION Left 11/13/2022   Procedure: LEFT ARM SECOND STAGE BASILIC VEIN TRANSPOSITION;  Surgeon: Victorino Sparrow, MD;  Location: [facility] OR;  Service: Vascular;  Laterality: Left;   CESAREAN SECTION  x4   CHOLECYSTECTOMY     EYE SURGERY     INSERTION OF DIALYSIS CATHETER Right 09/05/2022   Procedure: INSERTION OF TUNNELED DIALYSIS CATHETER RIGHT INTERNAL JUIGULAR VEIN;  Surgeon: Victorino Sparrow, MD;  Location: [facility] OR;  Service: Vascular;  Laterality: Right;  lma   IR REMOVAL TUN CV CATH W/O FL  01/19/2023   MASS EXCISION  10/27/2011   Procedure: EXCISION MASS;  Surgeon: Fabio Bering, MD;  Location: [facility] ORS;  Service: General;  Laterality: Right;   TUBAL LIGATION       Social History:  The patient  reports that she has never smoked. She has never used smokeless tobacco. She reports that she does not currently use alcohol. She reports that she does not use drugs.   Family History:  The patient's family history includes Diabetes in her brother, brother, and mother; Kidney disease in her father and mother; Kidney failure in her brother, father, and mother.    ROS:  Please see the history of present illness. All other systems are reviewed  and  Negative to the above problem except as noted.    PHYSICAL EXAM: VS:  BP 110/70 (BP Location: Right Arm, Patient Position: Sitting, Cuff Size: Large)   Pulse 74   Ht 5\' 2"  (1.575 m)   Wt 190 lb (86.2 kg)   LMP 09/22/2019 (Approximate)   SpO2 96%   BMI 34.75 kg/m   GEN: Obese 52 yo  in no acute distress  HEENT: normal  Neck: JVP is not elevated Cardiac: RRR; no murmurs]  Tr LE  edema  Respiratory:  clear to auscultation  GI: soft, nontender,   No hepatomegaly  MS: no deformity Moving all extremities    EKG:  EKG is not ordered today.   Echo Sept 2023   1. Left ventricular ejection fraction, by estimation, is 60 to 65%. The  left ventricle has normal function. The left ventricle has no regional  wall motion abnormalities. Left ventricular diastolic parameters are  consistent with Grade II diastolic  dysfunction (pseudonormalization). Elevated left ventricular end-diastolic  pressure.   2. Right ventricular systolic function was not well visualized. The right  ventricular size is not well visualized. Tricuspid regurgitation signal is  inadequate for assessing PA pressure.   3. Left atrial size was moderately dilated.   4. A small pericardial effusion is present. The pericardial effusion is  circumferential. There is no evidence of cardiac tamponade.   5. The mitral valve is normal in structure. No evidence of mitral valve  regurgitation. No evidence of mitral stenosis.   6. The aortic valve is tricuspid. Aortic valve regurgitation is not  visualized. Aortic valve sclerosis/calcification is present, without any  evidence of aortic stenosis.   Echo   09/16/20  Left ventricular ejection fraction, by estimation, is 60 to 65%. The left ventricle has normal function. The left ventricle has no regional wall motion abnormalities. There is moderate left ventricular hypertrophy. Left ventricular diastolic parameters were normal. 2. Right ventricular systolic function is normal.  The right ventricular size is normal. There is normal pulmonary artery systolic pressure. The estimated right ventricular systolic pressure is 27.4 mmHg. 3. Left atrial size was mildly dilated. 4. Right atrial size was mildly dilated. 5. Small to moderate pericardial effusion. The pericardial effusion is circumferential with moderate collection posteriorly. No clear evidence of tamponade physiology. 6. The mitral valve is grossly normal. Trivial mitral valve regurgitation. 7. The aortic valve is tricuspid. There is mild calcification of the aortic valve. Aortic valve regurgitation is not visualized. 8. The inferior vena cava is normal in size with greater than 50% respiratory variability, suggesting right atrial pressure of 3 mmHg.  Lipid Panel    Component Value  Date/Time   CHOL 133 08/04/2022 1120   TRIG 119 08/04/2022 1120   HDL 42 08/04/2022 1120   CHOLHDL 3.2 08/04/2022 1120   CHOLHDL 3.5 06/17/2021 1430   VLDL 15 06/17/2021 1430   LDLCALC 70 08/04/2022 1120   LDLCALC 154 (H) 03/18/2020 0000      Wt Readings from Last 3 Encounters:  05/02/23 190 lb (86.2 kg)  12/01/22 186 lb 14.4 oz (84.8 kg)  11/13/22 185 lb (83.9 kg)      ASSESSMENT AND PLAN:  1   HTN  BP is well controlled on current regimen  Dialysis going OK  2  HL  Last lipids in Jun 2024 LDL 70  HDL 42  3  DM  Last A1C in June 2024 A1C 10   Reviewed with pt   Cut out SSB, carbs, sugars.   Follow up with Dr Adriana Simas  3  CKD   Continue dialysis

## 2023-04-28 DIAGNOSIS — D689 Coagulation defect, unspecified: Secondary | ICD-10-CM | POA: Diagnosis not present

## 2023-04-28 DIAGNOSIS — N186 End stage renal disease: Secondary | ICD-10-CM | POA: Diagnosis not present

## 2023-04-28 DIAGNOSIS — N2581 Secondary hyperparathyroidism of renal origin: Secondary | ICD-10-CM | POA: Diagnosis not present

## 2023-04-28 DIAGNOSIS — D509 Iron deficiency anemia, unspecified: Secondary | ICD-10-CM | POA: Diagnosis not present

## 2023-04-28 DIAGNOSIS — Z992 Dependence on renal dialysis: Secondary | ICD-10-CM | POA: Diagnosis not present

## 2023-05-01 DIAGNOSIS — N186 End stage renal disease: Secondary | ICD-10-CM | POA: Diagnosis not present

## 2023-05-01 DIAGNOSIS — Z992 Dependence on renal dialysis: Secondary | ICD-10-CM | POA: Diagnosis not present

## 2023-05-01 DIAGNOSIS — N2581 Secondary hyperparathyroidism of renal origin: Secondary | ICD-10-CM | POA: Diagnosis not present

## 2023-05-01 DIAGNOSIS — D509 Iron deficiency anemia, unspecified: Secondary | ICD-10-CM | POA: Diagnosis not present

## 2023-05-01 DIAGNOSIS — D689 Coagulation defect, unspecified: Secondary | ICD-10-CM | POA: Diagnosis not present

## 2023-05-02 ENCOUNTER — Ambulatory Visit: Payer: 59 | Attending: Internal Medicine | Admitting: Internal Medicine

## 2023-05-02 ENCOUNTER — Encounter: Payer: Self-pay | Admitting: Internal Medicine

## 2023-05-02 VITALS — BP 110/70 | HR 74 | Ht 62.0 in | Wt 190.0 lb

## 2023-05-02 DIAGNOSIS — I10 Essential (primary) hypertension: Secondary | ICD-10-CM | POA: Diagnosis not present

## 2023-05-02 NOTE — Patient Instructions (Signed)
Medication Instructions:  Your physician recommends that you continue on your current medications as directed. Please refer to the Current Medication list given to you today.  *If you need a refill on your cardiac medications before your next appointment, please call your pharmacy*   Lab Work: NONE   If you have labs (blood work) drawn today and your tests are completely normal, you will receive your results only by: MyChart Message (if you have MyChart) OR A paper copy in the mail If you have any lab test that is abnormal or we need to change your treatment, we will call you to review the results.   Testing/Procedures: NONE    Follow-Up: At [facility], you and your health needs are our priority.  As part of our continuing mission to provide you with exceptional heart care, we have created designated Provider Care Teams.  These Care Teams include your primary Cardiologist (physician) and Advanced Practice Providers (APPs -  Physician Assistants and Nurse Practitioners) who all work together to provide you with the care you need, when you need it.  We recommend signing up for the patient portal called "MyChart".  Sign up information is provided on this After Visit Summary.  MyChart is used to connect with patients for Virtual Visits (Telemedicine).  Patients are able to view lab/test results, encounter notes, upcoming appointments, etc.  Non-urgent messages can be sent to your provider as well.   To learn more about what you can do with MyChart, go to [email or web address].    Your next appointment:    January   Provider:   You may see Dietrich Pates, MD or one of the following Advanced Practice Providers on your designated Care Team:   Randall An, PA-C  Jacolyn Reedy, PA-C     Other Instructions Thank you for choosing [facility]!

## 2023-05-03 DIAGNOSIS — Z992 Dependence on renal dialysis: Secondary | ICD-10-CM | POA: Diagnosis not present

## 2023-05-03 DIAGNOSIS — N186 End stage renal disease: Secondary | ICD-10-CM | POA: Diagnosis not present

## 2023-05-03 DIAGNOSIS — N2581 Secondary hyperparathyroidism of renal origin: Secondary | ICD-10-CM | POA: Diagnosis not present

## 2023-05-03 DIAGNOSIS — D509 Iron deficiency anemia, unspecified: Secondary | ICD-10-CM | POA: Diagnosis not present

## 2023-05-03 DIAGNOSIS — D689 Coagulation defect, unspecified: Secondary | ICD-10-CM | POA: Diagnosis not present

## 2023-05-05 DIAGNOSIS — D689 Coagulation defect, unspecified: Secondary | ICD-10-CM | POA: Diagnosis not present

## 2023-05-05 DIAGNOSIS — N2581 Secondary hyperparathyroidism of renal origin: Secondary | ICD-10-CM | POA: Diagnosis not present

## 2023-05-05 DIAGNOSIS — Z992 Dependence on renal dialysis: Secondary | ICD-10-CM | POA: Diagnosis not present

## 2023-05-05 DIAGNOSIS — N186 End stage renal disease: Secondary | ICD-10-CM | POA: Diagnosis not present

## 2023-05-05 DIAGNOSIS — D509 Iron deficiency anemia, unspecified: Secondary | ICD-10-CM | POA: Diagnosis not present

## 2023-05-08 DIAGNOSIS — Z992 Dependence on renal dialysis: Secondary | ICD-10-CM | POA: Diagnosis not present

## 2023-05-08 DIAGNOSIS — D689 Coagulation defect, unspecified: Secondary | ICD-10-CM | POA: Diagnosis not present

## 2023-05-08 DIAGNOSIS — D509 Iron deficiency anemia, unspecified: Secondary | ICD-10-CM | POA: Diagnosis not present

## 2023-05-08 DIAGNOSIS — N2581 Secondary hyperparathyroidism of renal origin: Secondary | ICD-10-CM | POA: Diagnosis not present

## 2023-05-08 DIAGNOSIS — N186 End stage renal disease: Secondary | ICD-10-CM | POA: Diagnosis not present

## 2023-05-10 DIAGNOSIS — D689 Coagulation defect, unspecified: Secondary | ICD-10-CM | POA: Diagnosis not present

## 2023-05-10 DIAGNOSIS — N2581 Secondary hyperparathyroidism of renal origin: Secondary | ICD-10-CM | POA: Diagnosis not present

## 2023-05-10 DIAGNOSIS — N186 End stage renal disease: Secondary | ICD-10-CM | POA: Diagnosis not present

## 2023-05-10 DIAGNOSIS — D509 Iron deficiency anemia, unspecified: Secondary | ICD-10-CM | POA: Diagnosis not present

## 2023-05-10 DIAGNOSIS — Z992 Dependence on renal dialysis: Secondary | ICD-10-CM | POA: Diagnosis not present

## 2023-05-15 DIAGNOSIS — N186 End stage renal disease: Secondary | ICD-10-CM | POA: Diagnosis not present

## 2023-05-15 DIAGNOSIS — Z992 Dependence on renal dialysis: Secondary | ICD-10-CM | POA: Diagnosis not present

## 2023-05-15 DIAGNOSIS — E1122 Type 2 diabetes mellitus with diabetic chronic kidney disease: Secondary | ICD-10-CM | POA: Diagnosis not present

## 2023-05-15 DIAGNOSIS — D689 Coagulation defect, unspecified: Secondary | ICD-10-CM | POA: Diagnosis not present

## 2023-05-15 DIAGNOSIS — D509 Iron deficiency anemia, unspecified: Secondary | ICD-10-CM | POA: Diagnosis not present

## 2023-05-15 DIAGNOSIS — N2581 Secondary hyperparathyroidism of renal origin: Secondary | ICD-10-CM | POA: Diagnosis not present

## 2023-05-17 DIAGNOSIS — E1122 Type 2 diabetes mellitus with diabetic chronic kidney disease: Secondary | ICD-10-CM | POA: Diagnosis not present

## 2023-05-17 DIAGNOSIS — D509 Iron deficiency anemia, unspecified: Secondary | ICD-10-CM | POA: Diagnosis not present

## 2023-05-17 DIAGNOSIS — D689 Coagulation defect, unspecified: Secondary | ICD-10-CM | POA: Diagnosis not present

## 2023-05-17 DIAGNOSIS — N186 End stage renal disease: Secondary | ICD-10-CM | POA: Diagnosis not present

## 2023-05-17 DIAGNOSIS — N2581 Secondary hyperparathyroidism of renal origin: Secondary | ICD-10-CM | POA: Diagnosis not present

## 2023-05-17 DIAGNOSIS — Z992 Dependence on renal dialysis: Secondary | ICD-10-CM | POA: Diagnosis not present

## 2023-05-19 DIAGNOSIS — E1122 Type 2 diabetes mellitus with diabetic chronic kidney disease: Secondary | ICD-10-CM | POA: Diagnosis not present

## 2023-05-19 DIAGNOSIS — N2581 Secondary hyperparathyroidism of renal origin: Secondary | ICD-10-CM | POA: Diagnosis not present

## 2023-05-19 DIAGNOSIS — D689 Coagulation defect, unspecified: Secondary | ICD-10-CM | POA: Diagnosis not present

## 2023-05-19 DIAGNOSIS — Z992 Dependence on renal dialysis: Secondary | ICD-10-CM | POA: Diagnosis not present

## 2023-05-19 DIAGNOSIS — D509 Iron deficiency anemia, unspecified: Secondary | ICD-10-CM | POA: Diagnosis not present

## 2023-05-19 DIAGNOSIS — N186 End stage renal disease: Secondary | ICD-10-CM | POA: Diagnosis not present

## 2023-05-22 DIAGNOSIS — Z992 Dependence on renal dialysis: Secondary | ICD-10-CM | POA: Diagnosis not present

## 2023-05-22 DIAGNOSIS — N2581 Secondary hyperparathyroidism of renal origin: Secondary | ICD-10-CM | POA: Diagnosis not present

## 2023-05-22 DIAGNOSIS — N186 End stage renal disease: Secondary | ICD-10-CM | POA: Diagnosis not present

## 2023-05-22 DIAGNOSIS — D689 Coagulation defect, unspecified: Secondary | ICD-10-CM | POA: Diagnosis not present

## 2023-05-24 DIAGNOSIS — N186 End stage renal disease: Secondary | ICD-10-CM | POA: Diagnosis not present

## 2023-05-24 DIAGNOSIS — D689 Coagulation defect, unspecified: Secondary | ICD-10-CM | POA: Diagnosis not present

## 2023-05-24 DIAGNOSIS — Z992 Dependence on renal dialysis: Secondary | ICD-10-CM | POA: Diagnosis not present

## 2023-05-24 DIAGNOSIS — N2581 Secondary hyperparathyroidism of renal origin: Secondary | ICD-10-CM | POA: Diagnosis not present

## 2023-05-26 DIAGNOSIS — D689 Coagulation defect, unspecified: Secondary | ICD-10-CM | POA: Diagnosis not present

## 2023-05-26 DIAGNOSIS — N2581 Secondary hyperparathyroidism of renal origin: Secondary | ICD-10-CM | POA: Diagnosis not present

## 2023-05-26 DIAGNOSIS — Z992 Dependence on renal dialysis: Secondary | ICD-10-CM | POA: Diagnosis not present

## 2023-05-26 DIAGNOSIS — N186 End stage renal disease: Secondary | ICD-10-CM | POA: Diagnosis not present

## 2023-05-29 DIAGNOSIS — D689 Coagulation defect, unspecified: Secondary | ICD-10-CM | POA: Diagnosis not present

## 2023-05-29 DIAGNOSIS — N186 End stage renal disease: Secondary | ICD-10-CM | POA: Diagnosis not present

## 2023-05-29 DIAGNOSIS — N2581 Secondary hyperparathyroidism of renal origin: Secondary | ICD-10-CM | POA: Diagnosis not present

## 2023-05-29 DIAGNOSIS — Z992 Dependence on renal dialysis: Secondary | ICD-10-CM | POA: Diagnosis not present

## 2023-05-29 DIAGNOSIS — D631 Anemia in chronic kidney disease: Secondary | ICD-10-CM | POA: Diagnosis not present

## 2023-05-31 DIAGNOSIS — D689 Coagulation defect, unspecified: Secondary | ICD-10-CM | POA: Diagnosis not present

## 2023-05-31 DIAGNOSIS — N186 End stage renal disease: Secondary | ICD-10-CM | POA: Diagnosis not present

## 2023-05-31 DIAGNOSIS — N2581 Secondary hyperparathyroidism of renal origin: Secondary | ICD-10-CM | POA: Diagnosis not present

## 2023-05-31 DIAGNOSIS — Z992 Dependence on renal dialysis: Secondary | ICD-10-CM | POA: Diagnosis not present

## 2023-05-31 DIAGNOSIS — D631 Anemia in chronic kidney disease: Secondary | ICD-10-CM | POA: Diagnosis not present

## 2023-06-02 DIAGNOSIS — D631 Anemia in chronic kidney disease: Secondary | ICD-10-CM | POA: Diagnosis not present

## 2023-06-02 DIAGNOSIS — N2581 Secondary hyperparathyroidism of renal origin: Secondary | ICD-10-CM | POA: Diagnosis not present

## 2023-06-02 DIAGNOSIS — Z992 Dependence on renal dialysis: Secondary | ICD-10-CM | POA: Diagnosis not present

## 2023-06-02 DIAGNOSIS — N186 End stage renal disease: Secondary | ICD-10-CM | POA: Diagnosis not present

## 2023-06-02 DIAGNOSIS — D689 Coagulation defect, unspecified: Secondary | ICD-10-CM | POA: Diagnosis not present

## 2023-06-05 DIAGNOSIS — N2581 Secondary hyperparathyroidism of renal origin: Secondary | ICD-10-CM | POA: Diagnosis not present

## 2023-06-05 DIAGNOSIS — N186 End stage renal disease: Secondary | ICD-10-CM | POA: Diagnosis not present

## 2023-06-05 DIAGNOSIS — Z992 Dependence on renal dialysis: Secondary | ICD-10-CM | POA: Diagnosis not present

## 2023-06-05 DIAGNOSIS — D689 Coagulation defect, unspecified: Secondary | ICD-10-CM | POA: Diagnosis not present

## 2023-06-07 DIAGNOSIS — N186 End stage renal disease: Secondary | ICD-10-CM | POA: Diagnosis not present

## 2023-06-07 DIAGNOSIS — N2581 Secondary hyperparathyroidism of renal origin: Secondary | ICD-10-CM | POA: Diagnosis not present

## 2023-06-07 DIAGNOSIS — D689 Coagulation defect, unspecified: Secondary | ICD-10-CM | POA: Diagnosis not present

## 2023-06-07 DIAGNOSIS — Z992 Dependence on renal dialysis: Secondary | ICD-10-CM | POA: Diagnosis not present

## 2023-06-08 ENCOUNTER — Other Ambulatory Visit: Payer: Self-pay | Admitting: Family Medicine

## 2023-06-08 MED ORDER — LEVOTHYROXINE SODIUM 50 MCG PO TABS: ORAL_TABLET | ORAL | 0 refills | Status: DC

## 2023-06-09 DIAGNOSIS — N2581 Secondary hyperparathyroidism of renal origin: Secondary | ICD-10-CM | POA: Diagnosis not present

## 2023-06-09 DIAGNOSIS — N186 End stage renal disease: Secondary | ICD-10-CM | POA: Diagnosis not present

## 2023-06-09 DIAGNOSIS — Z992 Dependence on renal dialysis: Secondary | ICD-10-CM | POA: Diagnosis not present

## 2023-06-09 DIAGNOSIS — D689 Coagulation defect, unspecified: Secondary | ICD-10-CM | POA: Diagnosis not present

## 2023-06-14 ENCOUNTER — Ambulatory Visit: Payer: Self-pay

## 2023-06-14 NOTE — Telephone Encounter (Signed)
 Chief Complaint: Low Back Pain Symptoms: radiates to bilateral legs Frequency: x 2 weeks Pertinent Negatives: Patient denies numbness, weakness, urinary symptoms Disposition: [] ED /[] Urgent Care (no appt availability in office) / [x] Appointment(In office/virtual)/ []  [facility]/ [] Home Care/ [] Refused Recommended Disposition /[] [facility]/ []  Follow-up with PCP Additional Notes: Pt reports she has been experiencing low back pain x 2 weeks, pain radiates down bilateral legs to feet, notes increased pain with ambulation. Denies numbness, weakness, urinary symptoms, fever, abd pain. OV scheduled. This RN educated pt on home care, new-worsening symptoms, when to call back/seek emergent care. Pt verbalized understanding and agrees to plan.    Copied from CRM [id number]. Topic: Clinical - Red Word Triage >> Jun 14, 2023  2:24 PM Antwanette L wrote: Red Word that prompted transfer to Nurse Triage: Patient has been experiencing severe back (lower)pain for the past 2 weeks. Pt kidney doctor advised her to speak with pcp about pain meds Reason for Disposition  [1] Pain radiates into the thigh or further down the leg AND [2] both legs  Answer Assessment - Initial Assessment Questions 1. ONSET: "When did the pain begin?"      X 2 weeks 2. LOCATION: "Where does it hurt?" (upper, mid or lower back)     Low back pain 3. SEVERITY: "How bad is the pain?"  (e.g., Scale 1-10; mild, moderate, or severe)   - MILD (1-3): Doesn't interfere with normal activities.    - MODERATE (4-7): Interferes with normal activities or awakens from sleep.    - SEVERE (8-10): Excruciating pain, unable to do any normal activities.      8/10 4. PATTERN: "Is the pain constant?" (e.g., yes, no; constant, intermittent)      Intermittent, increased pain with ambulation 5. RADIATION: "Does the pain shoot into your legs or somewhere else?"     Into both legs to feet 6. CAUSE:  "What do you think is causing the  back pain?"      Unknown 8. MEDICINES: "What have you taken so far for the pain?" (e.g., nothing, acetaminophen, NSAIDS)     Tylenol, no improvement 9. NEUROLOGIC SYMPTOMS: "Do you have any weakness, numbness, or problems with bowel/bladder control?"     None 10. OTHER SYMPTOMS: "Do you have any other symptoms?" (e.g., fever, abdomen pain, burning with urination, blood in urine)       None  Protocols used: Back Pain-A-AH

## 2023-06-15 ENCOUNTER — Ambulatory Visit (INDEPENDENT_AMBULATORY_CARE_PROVIDER_SITE_OTHER): Admitting: Physician Assistant

## 2023-06-15 ENCOUNTER — Encounter: Payer: Self-pay | Admitting: Physician Assistant

## 2023-06-15 VITALS — BP 114/71 | HR 69 | Temp 98.4°F | Ht 62.0 in | Wt 192.0 lb

## 2023-06-15 DIAGNOSIS — M62830 Muscle spasm of back: Secondary | ICD-10-CM | POA: Diagnosis not present

## 2023-06-15 DIAGNOSIS — M545 Low back pain, unspecified: Secondary | ICD-10-CM

## 2023-06-15 MED ORDER — CYCLOBENZAPRINE HCL 5 MG PO TABS
5.0000 mg | ORAL_TABLET | Freq: Three times a day (TID) | ORAL | 0 refills | Status: DC | PRN
Start: 2023-06-15 — End: 2023-11-22

## 2023-06-15 MED ORDER — PREDNISONE 20 MG PO TABS
ORAL_TABLET | ORAL | 0 refills | Status: AC
Start: 2023-06-15 — End: 2023-06-27

## 2023-06-15 NOTE — Assessment & Plan Note (Addendum)
 Likely musculoskeletal strain, paraspinal muscle tenderness on exam. Prednisone  taper and flexeril  for spasms. Strongly advised patient to avoid NSAIDs due to CKD.  Warm packs and massage as tolerated. Stay active and mobile as tolerated.  Follow up if not improving in one week or worsening. Warning signs reviewed.

## 2023-06-15 NOTE — Progress Notes (Signed)
   Acute Office Visit  Subjective:     Patient ID: Ana [name], female    DOB: [birth year], 53 y.o.   MRN: [id number]   Patient presents today with complaints of low back pain x 2 weeks. She reports low back pain and tightness, worsened by walking. She denies sciatica symptoms. She denies weakness or falls. She denies acute neuro symptoms. She reports taking ibuprofen /tylenol  for pain control. Patient with dialysis 3x a week.      Review of Systems  Constitutional:  Negative for fever and malaise/fatigue.  Musculoskeletal:  Positive for back pain. Negative for myalgias.  Neurological:  Negative for tingling and weakness.        Objective:     BP 114/71   Pulse 69   Temp 98.4 F (36.9 C)   Ht 5\' 2"  (1.575 m)   Wt 192 lb (87.1 kg)   LMP 09/22/2019 (Approximate)   SpO2 97%   BMI 35.12 kg/m   Physical Exam Constitutional:      Appearance: Normal appearance. She is obese.  Cardiovascular:     Rate and Rhythm: Normal rate and regular rhythm.     Heart sounds: Normal heart sounds. No murmur heard. Pulmonary:     Effort: Pulmonary effort is normal.     Breath sounds: Normal breath sounds.  Musculoskeletal:        General: Normal range of motion.     Lumbar back: Spasms and tenderness present. No signs of trauma or bony tenderness.  Neurological:     Mental Status: She is alert.     Motor: Motor function is intact.     Coordination: Coordination is intact.     Gait: Gait is intact.    No results found for any visits on 06/15/23.     Assessment & Plan:  Acute bilateral low back pain without sciatica Assessment & Plan: Likely musculoskeletal strain, paraspinal muscle tenderness on exam. Prednisone  taper and flexeril  for spasms. Advised patient to avoid NSAIDs due to CKD.  Warm packs and massage as tolerated. Stay active and mobile as tolerated.  Follow up if not improving in one week or worsening. Warning signs reviewed.    Orders: -     predniSONE ; Take 2  tablets (40 mg total) by mouth daily for 4 days, THEN 1 tablet (20 mg total) daily for 4 days, THEN 0.5 tablets (10 mg total) daily for 4 days.  Dispense: 14 tablet; Refill: 0  Spasm of muscle of lower back -     Cyclobenzaprine  HCl; Take 1 tablet (5 mg total) by mouth 3 (three) times daily as needed for muscle spasms.  Dispense: 20 tablet; Refill: 0     No follow-ups on file.  Jearlean Mince Jamond Neels, PA-C

## 2023-06-19 DIAGNOSIS — D631 Anemia in chronic kidney disease: Secondary | ICD-10-CM | POA: Diagnosis not present

## 2023-06-19 DIAGNOSIS — N186 End stage renal disease: Secondary | ICD-10-CM | POA: Diagnosis not present

## 2023-06-19 DIAGNOSIS — Z992 Dependence on renal dialysis: Secondary | ICD-10-CM | POA: Diagnosis not present

## 2023-06-19 DIAGNOSIS — D689 Coagulation defect, unspecified: Secondary | ICD-10-CM | POA: Diagnosis not present

## 2023-06-19 DIAGNOSIS — N2581 Secondary hyperparathyroidism of renal origin: Secondary | ICD-10-CM | POA: Diagnosis not present

## 2023-06-21 DIAGNOSIS — N186 End stage renal disease: Secondary | ICD-10-CM | POA: Diagnosis not present

## 2023-06-21 DIAGNOSIS — N2581 Secondary hyperparathyroidism of renal origin: Secondary | ICD-10-CM | POA: Diagnosis not present

## 2023-06-21 DIAGNOSIS — Z992 Dependence on renal dialysis: Secondary | ICD-10-CM | POA: Diagnosis not present

## 2023-06-21 DIAGNOSIS — D631 Anemia in chronic kidney disease: Secondary | ICD-10-CM | POA: Diagnosis not present

## 2023-06-21 DIAGNOSIS — D689 Coagulation defect, unspecified: Secondary | ICD-10-CM | POA: Diagnosis not present

## 2023-06-23 DIAGNOSIS — Z992 Dependence on renal dialysis: Secondary | ICD-10-CM | POA: Diagnosis not present

## 2023-06-23 DIAGNOSIS — N2581 Secondary hyperparathyroidism of renal origin: Secondary | ICD-10-CM | POA: Diagnosis not present

## 2023-06-23 DIAGNOSIS — N186 End stage renal disease: Secondary | ICD-10-CM | POA: Diagnosis not present

## 2023-06-23 DIAGNOSIS — D689 Coagulation defect, unspecified: Secondary | ICD-10-CM | POA: Diagnosis not present

## 2023-06-23 DIAGNOSIS — D631 Anemia in chronic kidney disease: Secondary | ICD-10-CM | POA: Diagnosis not present

## 2023-06-26 DIAGNOSIS — N2581 Secondary hyperparathyroidism of renal origin: Secondary | ICD-10-CM | POA: Diagnosis not present

## 2023-06-26 DIAGNOSIS — Z992 Dependence on renal dialysis: Secondary | ICD-10-CM | POA: Diagnosis not present

## 2023-06-26 DIAGNOSIS — N186 End stage renal disease: Secondary | ICD-10-CM | POA: Diagnosis not present

## 2023-06-26 DIAGNOSIS — D689 Coagulation defect, unspecified: Secondary | ICD-10-CM | POA: Diagnosis not present

## 2023-06-28 DIAGNOSIS — D631 Anemia in chronic kidney disease: Secondary | ICD-10-CM | POA: Diagnosis not present

## 2023-06-28 DIAGNOSIS — Z992 Dependence on renal dialysis: Secondary | ICD-10-CM | POA: Diagnosis not present

## 2023-06-28 DIAGNOSIS — N2581 Secondary hyperparathyroidism of renal origin: Secondary | ICD-10-CM | POA: Diagnosis not present

## 2023-06-28 DIAGNOSIS — D689 Coagulation defect, unspecified: Secondary | ICD-10-CM | POA: Diagnosis not present

## 2023-06-28 DIAGNOSIS — N186 End stage renal disease: Secondary | ICD-10-CM | POA: Diagnosis not present

## 2023-06-30 DIAGNOSIS — N2581 Secondary hyperparathyroidism of renal origin: Secondary | ICD-10-CM | POA: Diagnosis not present

## 2023-06-30 DIAGNOSIS — D689 Coagulation defect, unspecified: Secondary | ICD-10-CM | POA: Diagnosis not present

## 2023-06-30 DIAGNOSIS — N186 End stage renal disease: Secondary | ICD-10-CM | POA: Diagnosis not present

## 2023-06-30 DIAGNOSIS — D631 Anemia in chronic kidney disease: Secondary | ICD-10-CM | POA: Diagnosis not present

## 2023-06-30 DIAGNOSIS — Z992 Dependence on renal dialysis: Secondary | ICD-10-CM | POA: Diagnosis not present

## 2023-07-03 DIAGNOSIS — N186 End stage renal disease: Secondary | ICD-10-CM | POA: Diagnosis not present

## 2023-07-03 DIAGNOSIS — N2581 Secondary hyperparathyroidism of renal origin: Secondary | ICD-10-CM | POA: Diagnosis not present

## 2023-07-03 DIAGNOSIS — Z992 Dependence on renal dialysis: Secondary | ICD-10-CM | POA: Diagnosis not present

## 2023-07-03 DIAGNOSIS — D689 Coagulation defect, unspecified: Secondary | ICD-10-CM | POA: Diagnosis not present

## 2023-07-05 DIAGNOSIS — N186 End stage renal disease: Secondary | ICD-10-CM | POA: Diagnosis not present

## 2023-07-05 DIAGNOSIS — D689 Coagulation defect, unspecified: Secondary | ICD-10-CM | POA: Diagnosis not present

## 2023-07-05 DIAGNOSIS — N2581 Secondary hyperparathyroidism of renal origin: Secondary | ICD-10-CM | POA: Diagnosis not present

## 2023-07-05 DIAGNOSIS — Z992 Dependence on renal dialysis: Secondary | ICD-10-CM | POA: Diagnosis not present

## 2023-07-07 DIAGNOSIS — N186 End stage renal disease: Secondary | ICD-10-CM | POA: Diagnosis not present

## 2023-07-07 DIAGNOSIS — N2581 Secondary hyperparathyroidism of renal origin: Secondary | ICD-10-CM | POA: Diagnosis not present

## 2023-07-07 DIAGNOSIS — D689 Coagulation defect, unspecified: Secondary | ICD-10-CM | POA: Diagnosis not present

## 2023-07-07 DIAGNOSIS — Z992 Dependence on renal dialysis: Secondary | ICD-10-CM | POA: Diagnosis not present

## 2023-07-11 DIAGNOSIS — D631 Anemia in chronic kidney disease: Secondary | ICD-10-CM | POA: Diagnosis not present

## 2023-07-11 DIAGNOSIS — N186 End stage renal disease: Secondary | ICD-10-CM | POA: Diagnosis not present

## 2023-07-11 DIAGNOSIS — N2581 Secondary hyperparathyroidism of renal origin: Secondary | ICD-10-CM | POA: Diagnosis not present

## 2023-07-11 DIAGNOSIS — Z992 Dependence on renal dialysis: Secondary | ICD-10-CM | POA: Diagnosis not present

## 2023-07-11 DIAGNOSIS — D689 Coagulation defect, unspecified: Secondary | ICD-10-CM | POA: Diagnosis not present

## 2023-07-12 DIAGNOSIS — N186 End stage renal disease: Secondary | ICD-10-CM | POA: Diagnosis not present

## 2023-07-12 DIAGNOSIS — N2581 Secondary hyperparathyroidism of renal origin: Secondary | ICD-10-CM | POA: Diagnosis not present

## 2023-07-12 DIAGNOSIS — D631 Anemia in chronic kidney disease: Secondary | ICD-10-CM | POA: Diagnosis not present

## 2023-07-12 DIAGNOSIS — D689 Coagulation defect, unspecified: Secondary | ICD-10-CM | POA: Diagnosis not present

## 2023-07-12 DIAGNOSIS — Z992 Dependence on renal dialysis: Secondary | ICD-10-CM | POA: Diagnosis not present

## 2023-07-14 DIAGNOSIS — N186 End stage renal disease: Secondary | ICD-10-CM | POA: Diagnosis not present

## 2023-07-14 DIAGNOSIS — D689 Coagulation defect, unspecified: Secondary | ICD-10-CM | POA: Diagnosis not present

## 2023-07-14 DIAGNOSIS — D631 Anemia in chronic kidney disease: Secondary | ICD-10-CM | POA: Diagnosis not present

## 2023-07-14 DIAGNOSIS — Z992 Dependence on renal dialysis: Secondary | ICD-10-CM | POA: Diagnosis not present

## 2023-07-14 DIAGNOSIS — N2581 Secondary hyperparathyroidism of renal origin: Secondary | ICD-10-CM | POA: Diagnosis not present

## 2023-07-17 DIAGNOSIS — N186 End stage renal disease: Secondary | ICD-10-CM | POA: Diagnosis not present

## 2023-07-17 DIAGNOSIS — Z992 Dependence on renal dialysis: Secondary | ICD-10-CM | POA: Diagnosis not present

## 2023-07-17 DIAGNOSIS — D689 Coagulation defect, unspecified: Secondary | ICD-10-CM | POA: Diagnosis not present

## 2023-07-17 DIAGNOSIS — E1122 Type 2 diabetes mellitus with diabetic chronic kidney disease: Secondary | ICD-10-CM | POA: Diagnosis not present

## 2023-07-17 DIAGNOSIS — N2581 Secondary hyperparathyroidism of renal origin: Secondary | ICD-10-CM | POA: Diagnosis not present

## 2023-07-17 DIAGNOSIS — E039 Hypothyroidism, unspecified: Secondary | ICD-10-CM | POA: Diagnosis not present

## 2023-07-19 DIAGNOSIS — N2581 Secondary hyperparathyroidism of renal origin: Secondary | ICD-10-CM | POA: Diagnosis not present

## 2023-07-19 DIAGNOSIS — E039 Hypothyroidism, unspecified: Secondary | ICD-10-CM | POA: Diagnosis not present

## 2023-07-19 DIAGNOSIS — D689 Coagulation defect, unspecified: Secondary | ICD-10-CM | POA: Diagnosis not present

## 2023-07-19 DIAGNOSIS — E1122 Type 2 diabetes mellitus with diabetic chronic kidney disease: Secondary | ICD-10-CM | POA: Diagnosis not present

## 2023-07-19 DIAGNOSIS — Z992 Dependence on renal dialysis: Secondary | ICD-10-CM | POA: Diagnosis not present

## 2023-07-19 DIAGNOSIS — N186 End stage renal disease: Secondary | ICD-10-CM | POA: Diagnosis not present

## 2023-07-21 DIAGNOSIS — D689 Coagulation defect, unspecified: Secondary | ICD-10-CM | POA: Diagnosis not present

## 2023-07-21 DIAGNOSIS — N186 End stage renal disease: Secondary | ICD-10-CM | POA: Diagnosis not present

## 2023-07-21 DIAGNOSIS — N2581 Secondary hyperparathyroidism of renal origin: Secondary | ICD-10-CM | POA: Diagnosis not present

## 2023-07-21 DIAGNOSIS — E1122 Type 2 diabetes mellitus with diabetic chronic kidney disease: Secondary | ICD-10-CM | POA: Diagnosis not present

## 2023-07-21 DIAGNOSIS — Z992 Dependence on renal dialysis: Secondary | ICD-10-CM | POA: Diagnosis not present

## 2023-07-21 DIAGNOSIS — E039 Hypothyroidism, unspecified: Secondary | ICD-10-CM | POA: Diagnosis not present

## 2023-07-26 DIAGNOSIS — D689 Coagulation defect, unspecified: Secondary | ICD-10-CM | POA: Diagnosis not present

## 2023-07-26 DIAGNOSIS — N186 End stage renal disease: Secondary | ICD-10-CM | POA: Diagnosis not present

## 2023-07-26 DIAGNOSIS — N2581 Secondary hyperparathyroidism of renal origin: Secondary | ICD-10-CM | POA: Diagnosis not present

## 2023-07-26 DIAGNOSIS — D631 Anemia in chronic kidney disease: Secondary | ICD-10-CM | POA: Diagnosis not present

## 2023-07-26 DIAGNOSIS — D509 Iron deficiency anemia, unspecified: Secondary | ICD-10-CM | POA: Diagnosis not present

## 2023-07-26 DIAGNOSIS — Z992 Dependence on renal dialysis: Secondary | ICD-10-CM | POA: Diagnosis not present

## 2023-07-28 DIAGNOSIS — N186 End stage renal disease: Secondary | ICD-10-CM | POA: Diagnosis not present

## 2023-07-28 DIAGNOSIS — D631 Anemia in chronic kidney disease: Secondary | ICD-10-CM | POA: Diagnosis not present

## 2023-07-28 DIAGNOSIS — D689 Coagulation defect, unspecified: Secondary | ICD-10-CM | POA: Diagnosis not present

## 2023-07-28 DIAGNOSIS — Z992 Dependence on renal dialysis: Secondary | ICD-10-CM | POA: Diagnosis not present

## 2023-07-28 DIAGNOSIS — D509 Iron deficiency anemia, unspecified: Secondary | ICD-10-CM | POA: Diagnosis not present

## 2023-07-28 DIAGNOSIS — N2581 Secondary hyperparathyroidism of renal origin: Secondary | ICD-10-CM | POA: Diagnosis not present

## 2023-07-29 DIAGNOSIS — N186 End stage renal disease: Secondary | ICD-10-CM | POA: Diagnosis not present

## 2023-07-29 DIAGNOSIS — Z992 Dependence on renal dialysis: Secondary | ICD-10-CM | POA: Diagnosis not present

## 2023-07-31 DIAGNOSIS — D509 Iron deficiency anemia, unspecified: Secondary | ICD-10-CM | POA: Diagnosis not present

## 2023-07-31 DIAGNOSIS — Z992 Dependence on renal dialysis: Secondary | ICD-10-CM | POA: Diagnosis not present

## 2023-07-31 DIAGNOSIS — N2581 Secondary hyperparathyroidism of renal origin: Secondary | ICD-10-CM | POA: Diagnosis not present

## 2023-07-31 DIAGNOSIS — D689 Coagulation defect, unspecified: Secondary | ICD-10-CM | POA: Diagnosis not present

## 2023-07-31 DIAGNOSIS — N186 End stage renal disease: Secondary | ICD-10-CM | POA: Diagnosis not present

## 2023-08-01 DIAGNOSIS — I5032 Chronic diastolic (congestive) heart failure: Secondary | ICD-10-CM | POA: Diagnosis not present

## 2023-08-01 DIAGNOSIS — Z794 Long term (current) use of insulin: Secondary | ICD-10-CM | POA: Diagnosis not present

## 2023-08-01 DIAGNOSIS — Z0001 Encounter for general adult medical examination with abnormal findings: Secondary | ICD-10-CM | POA: Diagnosis not present

## 2023-08-01 DIAGNOSIS — N186 End stage renal disease: Secondary | ICD-10-CM | POA: Diagnosis not present

## 2023-08-01 DIAGNOSIS — E1122 Type 2 diabetes mellitus with diabetic chronic kidney disease: Secondary | ICD-10-CM | POA: Diagnosis not present

## 2023-08-01 DIAGNOSIS — H547 Unspecified visual loss: Secondary | ICD-10-CM | POA: Diagnosis not present

## 2023-08-01 DIAGNOSIS — Z136 Encounter for screening for cardiovascular disorders: Secondary | ICD-10-CM | POA: Diagnosis not present

## 2023-08-01 DIAGNOSIS — I132 Hypertensive heart and chronic kidney disease with heart failure and with stage 5 chronic kidney disease, or end stage renal disease: Secondary | ICD-10-CM | POA: Diagnosis not present

## 2023-08-04 DIAGNOSIS — D509 Iron deficiency anemia, unspecified: Secondary | ICD-10-CM | POA: Diagnosis not present

## 2023-08-04 DIAGNOSIS — Z992 Dependence on renal dialysis: Secondary | ICD-10-CM | POA: Diagnosis not present

## 2023-08-04 DIAGNOSIS — N186 End stage renal disease: Secondary | ICD-10-CM | POA: Diagnosis not present

## 2023-08-04 DIAGNOSIS — D689 Coagulation defect, unspecified: Secondary | ICD-10-CM | POA: Diagnosis not present

## 2023-08-04 DIAGNOSIS — N2581 Secondary hyperparathyroidism of renal origin: Secondary | ICD-10-CM | POA: Diagnosis not present

## 2023-08-06 DIAGNOSIS — E1165 Type 2 diabetes mellitus with hyperglycemia: Secondary | ICD-10-CM | POA: Diagnosis not present

## 2023-08-07 DIAGNOSIS — D631 Anemia in chronic kidney disease: Secondary | ICD-10-CM | POA: Diagnosis not present

## 2023-08-07 DIAGNOSIS — N2581 Secondary hyperparathyroidism of renal origin: Secondary | ICD-10-CM | POA: Diagnosis not present

## 2023-08-07 DIAGNOSIS — Z992 Dependence on renal dialysis: Secondary | ICD-10-CM | POA: Diagnosis not present

## 2023-08-07 DIAGNOSIS — E1165 Type 2 diabetes mellitus with hyperglycemia: Secondary | ICD-10-CM | POA: Diagnosis not present

## 2023-08-07 DIAGNOSIS — N186 End stage renal disease: Secondary | ICD-10-CM | POA: Diagnosis not present

## 2023-08-07 DIAGNOSIS — D509 Iron deficiency anemia, unspecified: Secondary | ICD-10-CM | POA: Diagnosis not present

## 2023-08-07 DIAGNOSIS — D689 Coagulation defect, unspecified: Secondary | ICD-10-CM | POA: Diagnosis not present

## 2023-08-08 DIAGNOSIS — E1165 Type 2 diabetes mellitus with hyperglycemia: Secondary | ICD-10-CM | POA: Diagnosis not present

## 2023-08-09 DIAGNOSIS — E1165 Type 2 diabetes mellitus with hyperglycemia: Secondary | ICD-10-CM | POA: Diagnosis not present

## 2023-08-10 DIAGNOSIS — E1165 Type 2 diabetes mellitus with hyperglycemia: Secondary | ICD-10-CM | POA: Diagnosis not present

## 2023-08-11 DIAGNOSIS — D631 Anemia in chronic kidney disease: Secondary | ICD-10-CM | POA: Diagnosis not present

## 2023-08-11 DIAGNOSIS — N2581 Secondary hyperparathyroidism of renal origin: Secondary | ICD-10-CM | POA: Diagnosis not present

## 2023-08-11 DIAGNOSIS — D689 Coagulation defect, unspecified: Secondary | ICD-10-CM | POA: Diagnosis not present

## 2023-08-11 DIAGNOSIS — N186 End stage renal disease: Secondary | ICD-10-CM | POA: Diagnosis not present

## 2023-08-11 DIAGNOSIS — D509 Iron deficiency anemia, unspecified: Secondary | ICD-10-CM | POA: Diagnosis not present

## 2023-08-11 DIAGNOSIS — E1165 Type 2 diabetes mellitus with hyperglycemia: Secondary | ICD-10-CM | POA: Diagnosis not present

## 2023-08-11 DIAGNOSIS — Z992 Dependence on renal dialysis: Secondary | ICD-10-CM | POA: Diagnosis not present

## 2023-08-12 DIAGNOSIS — E1165 Type 2 diabetes mellitus with hyperglycemia: Secondary | ICD-10-CM | POA: Diagnosis not present

## 2023-08-13 DIAGNOSIS — E1165 Type 2 diabetes mellitus with hyperglycemia: Secondary | ICD-10-CM | POA: Diagnosis not present

## 2023-08-14 DIAGNOSIS — D509 Iron deficiency anemia, unspecified: Secondary | ICD-10-CM | POA: Diagnosis not present

## 2023-08-14 DIAGNOSIS — N2581 Secondary hyperparathyroidism of renal origin: Secondary | ICD-10-CM | POA: Diagnosis not present

## 2023-08-14 DIAGNOSIS — D689 Coagulation defect, unspecified: Secondary | ICD-10-CM | POA: Diagnosis not present

## 2023-08-14 DIAGNOSIS — Z992 Dependence on renal dialysis: Secondary | ICD-10-CM | POA: Diagnosis not present

## 2023-08-14 DIAGNOSIS — N186 End stage renal disease: Secondary | ICD-10-CM | POA: Diagnosis not present

## 2023-08-14 DIAGNOSIS — E1122 Type 2 diabetes mellitus with diabetic chronic kidney disease: Secondary | ICD-10-CM | POA: Diagnosis not present

## 2023-08-14 DIAGNOSIS — E1165 Type 2 diabetes mellitus with hyperglycemia: Secondary | ICD-10-CM | POA: Diagnosis not present

## 2023-08-15 DIAGNOSIS — E1165 Type 2 diabetes mellitus with hyperglycemia: Secondary | ICD-10-CM | POA: Diagnosis not present

## 2023-08-16 DIAGNOSIS — N2581 Secondary hyperparathyroidism of renal origin: Secondary | ICD-10-CM | POA: Diagnosis not present

## 2023-08-16 DIAGNOSIS — E1165 Type 2 diabetes mellitus with hyperglycemia: Secondary | ICD-10-CM | POA: Diagnosis not present

## 2023-08-16 DIAGNOSIS — E1122 Type 2 diabetes mellitus with diabetic chronic kidney disease: Secondary | ICD-10-CM | POA: Diagnosis not present

## 2023-08-16 DIAGNOSIS — N186 End stage renal disease: Secondary | ICD-10-CM | POA: Diagnosis not present

## 2023-08-16 DIAGNOSIS — D509 Iron deficiency anemia, unspecified: Secondary | ICD-10-CM | POA: Diagnosis not present

## 2023-08-16 DIAGNOSIS — D689 Coagulation defect, unspecified: Secondary | ICD-10-CM | POA: Diagnosis not present

## 2023-08-16 DIAGNOSIS — Z992 Dependence on renal dialysis: Secondary | ICD-10-CM | POA: Diagnosis not present

## 2023-08-17 DIAGNOSIS — E1165 Type 2 diabetes mellitus with hyperglycemia: Secondary | ICD-10-CM | POA: Diagnosis not present

## 2023-08-18 DIAGNOSIS — N186 End stage renal disease: Secondary | ICD-10-CM | POA: Diagnosis not present

## 2023-08-18 DIAGNOSIS — N2581 Secondary hyperparathyroidism of renal origin: Secondary | ICD-10-CM | POA: Diagnosis not present

## 2023-08-18 DIAGNOSIS — D689 Coagulation defect, unspecified: Secondary | ICD-10-CM | POA: Diagnosis not present

## 2023-08-18 DIAGNOSIS — D509 Iron deficiency anemia, unspecified: Secondary | ICD-10-CM | POA: Diagnosis not present

## 2023-08-18 DIAGNOSIS — E1165 Type 2 diabetes mellitus with hyperglycemia: Secondary | ICD-10-CM | POA: Diagnosis not present

## 2023-08-18 DIAGNOSIS — Z992 Dependence on renal dialysis: Secondary | ICD-10-CM | POA: Diagnosis not present

## 2023-08-18 DIAGNOSIS — E1122 Type 2 diabetes mellitus with diabetic chronic kidney disease: Secondary | ICD-10-CM | POA: Diagnosis not present

## 2023-08-19 DIAGNOSIS — E1165 Type 2 diabetes mellitus with hyperglycemia: Secondary | ICD-10-CM | POA: Diagnosis not present

## 2023-08-20 DIAGNOSIS — E1165 Type 2 diabetes mellitus with hyperglycemia: Secondary | ICD-10-CM | POA: Diagnosis not present

## 2023-08-21 DIAGNOSIS — D509 Iron deficiency anemia, unspecified: Secondary | ICD-10-CM | POA: Diagnosis not present

## 2023-08-21 DIAGNOSIS — N2581 Secondary hyperparathyroidism of renal origin: Secondary | ICD-10-CM | POA: Diagnosis not present

## 2023-08-21 DIAGNOSIS — E1165 Type 2 diabetes mellitus with hyperglycemia: Secondary | ICD-10-CM | POA: Diagnosis not present

## 2023-08-21 DIAGNOSIS — D689 Coagulation defect, unspecified: Secondary | ICD-10-CM | POA: Diagnosis not present

## 2023-08-21 DIAGNOSIS — N186 End stage renal disease: Secondary | ICD-10-CM | POA: Diagnosis not present

## 2023-08-21 DIAGNOSIS — Z992 Dependence on renal dialysis: Secondary | ICD-10-CM | POA: Diagnosis not present

## 2023-08-21 DIAGNOSIS — D631 Anemia in chronic kidney disease: Secondary | ICD-10-CM | POA: Diagnosis not present

## 2023-08-22 DIAGNOSIS — E1165 Type 2 diabetes mellitus with hyperglycemia: Secondary | ICD-10-CM | POA: Diagnosis not present

## 2023-08-23 DIAGNOSIS — Z992 Dependence on renal dialysis: Secondary | ICD-10-CM | POA: Diagnosis not present

## 2023-08-23 DIAGNOSIS — D631 Anemia in chronic kidney disease: Secondary | ICD-10-CM | POA: Diagnosis not present

## 2023-08-23 DIAGNOSIS — N186 End stage renal disease: Secondary | ICD-10-CM | POA: Diagnosis not present

## 2023-08-23 DIAGNOSIS — D509 Iron deficiency anemia, unspecified: Secondary | ICD-10-CM | POA: Diagnosis not present

## 2023-08-23 DIAGNOSIS — D689 Coagulation defect, unspecified: Secondary | ICD-10-CM | POA: Diagnosis not present

## 2023-08-23 DIAGNOSIS — E1165 Type 2 diabetes mellitus with hyperglycemia: Secondary | ICD-10-CM | POA: Diagnosis not present

## 2023-08-23 DIAGNOSIS — N2581 Secondary hyperparathyroidism of renal origin: Secondary | ICD-10-CM | POA: Diagnosis not present

## 2023-08-24 DIAGNOSIS — E1165 Type 2 diabetes mellitus with hyperglycemia: Secondary | ICD-10-CM | POA: Diagnosis not present

## 2023-08-25 DIAGNOSIS — D509 Iron deficiency anemia, unspecified: Secondary | ICD-10-CM | POA: Diagnosis not present

## 2023-08-25 DIAGNOSIS — D689 Coagulation defect, unspecified: Secondary | ICD-10-CM | POA: Diagnosis not present

## 2023-08-25 DIAGNOSIS — N2581 Secondary hyperparathyroidism of renal origin: Secondary | ICD-10-CM | POA: Diagnosis not present

## 2023-08-25 DIAGNOSIS — Z992 Dependence on renal dialysis: Secondary | ICD-10-CM | POA: Diagnosis not present

## 2023-08-25 DIAGNOSIS — E1165 Type 2 diabetes mellitus with hyperglycemia: Secondary | ICD-10-CM | POA: Diagnosis not present

## 2023-08-25 DIAGNOSIS — N186 End stage renal disease: Secondary | ICD-10-CM | POA: Diagnosis not present

## 2023-08-25 DIAGNOSIS — D631 Anemia in chronic kidney disease: Secondary | ICD-10-CM | POA: Diagnosis not present

## 2023-08-26 DIAGNOSIS — E1165 Type 2 diabetes mellitus with hyperglycemia: Secondary | ICD-10-CM | POA: Diagnosis not present

## 2023-08-27 DIAGNOSIS — E1165 Type 2 diabetes mellitus with hyperglycemia: Secondary | ICD-10-CM | POA: Diagnosis not present

## 2023-08-28 DIAGNOSIS — N2581 Secondary hyperparathyroidism of renal origin: Secondary | ICD-10-CM | POA: Diagnosis not present

## 2023-08-28 DIAGNOSIS — E1165 Type 2 diabetes mellitus with hyperglycemia: Secondary | ICD-10-CM | POA: Diagnosis not present

## 2023-08-28 DIAGNOSIS — D689 Coagulation defect, unspecified: Secondary | ICD-10-CM | POA: Diagnosis not present

## 2023-08-28 DIAGNOSIS — Z992 Dependence on renal dialysis: Secondary | ICD-10-CM | POA: Diagnosis not present

## 2023-08-28 DIAGNOSIS — N186 End stage renal disease: Secondary | ICD-10-CM | POA: Diagnosis not present

## 2023-08-28 DIAGNOSIS — D509 Iron deficiency anemia, unspecified: Secondary | ICD-10-CM | POA: Diagnosis not present

## 2023-08-29 DIAGNOSIS — E1165 Type 2 diabetes mellitus with hyperglycemia: Secondary | ICD-10-CM | POA: Diagnosis not present

## 2023-08-30 DIAGNOSIS — D689 Coagulation defect, unspecified: Secondary | ICD-10-CM | POA: Diagnosis not present

## 2023-08-30 DIAGNOSIS — Z992 Dependence on renal dialysis: Secondary | ICD-10-CM | POA: Diagnosis not present

## 2023-08-30 DIAGNOSIS — N2581 Secondary hyperparathyroidism of renal origin: Secondary | ICD-10-CM | POA: Diagnosis not present

## 2023-08-30 DIAGNOSIS — D509 Iron deficiency anemia, unspecified: Secondary | ICD-10-CM | POA: Diagnosis not present

## 2023-08-30 DIAGNOSIS — E1165 Type 2 diabetes mellitus with hyperglycemia: Secondary | ICD-10-CM | POA: Diagnosis not present

## 2023-08-30 DIAGNOSIS — N186 End stage renal disease: Secondary | ICD-10-CM | POA: Diagnosis not present

## 2023-08-31 DIAGNOSIS — E1165 Type 2 diabetes mellitus with hyperglycemia: Secondary | ICD-10-CM | POA: Diagnosis not present

## 2023-09-01 DIAGNOSIS — N2581 Secondary hyperparathyroidism of renal origin: Secondary | ICD-10-CM | POA: Diagnosis not present

## 2023-09-01 DIAGNOSIS — N186 End stage renal disease: Secondary | ICD-10-CM | POA: Diagnosis not present

## 2023-09-01 DIAGNOSIS — D509 Iron deficiency anemia, unspecified: Secondary | ICD-10-CM | POA: Diagnosis not present

## 2023-09-01 DIAGNOSIS — D689 Coagulation defect, unspecified: Secondary | ICD-10-CM | POA: Diagnosis not present

## 2023-09-01 DIAGNOSIS — E1165 Type 2 diabetes mellitus with hyperglycemia: Secondary | ICD-10-CM | POA: Diagnosis not present

## 2023-09-01 DIAGNOSIS — Z992 Dependence on renal dialysis: Secondary | ICD-10-CM | POA: Diagnosis not present

## 2023-09-02 DIAGNOSIS — E1165 Type 2 diabetes mellitus with hyperglycemia: Secondary | ICD-10-CM | POA: Diagnosis not present

## 2023-09-03 DIAGNOSIS — E1165 Type 2 diabetes mellitus with hyperglycemia: Secondary | ICD-10-CM | POA: Diagnosis not present

## 2023-09-04 DIAGNOSIS — N186 End stage renal disease: Secondary | ICD-10-CM | POA: Diagnosis not present

## 2023-09-04 DIAGNOSIS — N2581 Secondary hyperparathyroidism of renal origin: Secondary | ICD-10-CM | POA: Diagnosis not present

## 2023-09-04 DIAGNOSIS — E1165 Type 2 diabetes mellitus with hyperglycemia: Secondary | ICD-10-CM | POA: Diagnosis not present

## 2023-09-04 DIAGNOSIS — D509 Iron deficiency anemia, unspecified: Secondary | ICD-10-CM | POA: Diagnosis not present

## 2023-09-04 DIAGNOSIS — D689 Coagulation defect, unspecified: Secondary | ICD-10-CM | POA: Diagnosis not present

## 2023-09-04 DIAGNOSIS — Z992 Dependence on renal dialysis: Secondary | ICD-10-CM | POA: Diagnosis not present

## 2023-09-05 DIAGNOSIS — E1165 Type 2 diabetes mellitus with hyperglycemia: Secondary | ICD-10-CM | POA: Diagnosis not present

## 2023-09-06 DIAGNOSIS — D509 Iron deficiency anemia, unspecified: Secondary | ICD-10-CM | POA: Diagnosis not present

## 2023-09-06 DIAGNOSIS — Z992 Dependence on renal dialysis: Secondary | ICD-10-CM | POA: Diagnosis not present

## 2023-09-06 DIAGNOSIS — D689 Coagulation defect, unspecified: Secondary | ICD-10-CM | POA: Diagnosis not present

## 2023-09-06 DIAGNOSIS — N186 End stage renal disease: Secondary | ICD-10-CM | POA: Diagnosis not present

## 2023-09-06 DIAGNOSIS — N2581 Secondary hyperparathyroidism of renal origin: Secondary | ICD-10-CM | POA: Diagnosis not present

## 2023-09-06 DIAGNOSIS — E1165 Type 2 diabetes mellitus with hyperglycemia: Secondary | ICD-10-CM | POA: Diagnosis not present

## 2023-09-07 DIAGNOSIS — E1165 Type 2 diabetes mellitus with hyperglycemia: Secondary | ICD-10-CM | POA: Diagnosis not present

## 2023-09-08 DIAGNOSIS — D509 Iron deficiency anemia, unspecified: Secondary | ICD-10-CM | POA: Diagnosis not present

## 2023-09-08 DIAGNOSIS — N2581 Secondary hyperparathyroidism of renal origin: Secondary | ICD-10-CM | POA: Diagnosis not present

## 2023-09-08 DIAGNOSIS — Z992 Dependence on renal dialysis: Secondary | ICD-10-CM | POA: Diagnosis not present

## 2023-09-08 DIAGNOSIS — D689 Coagulation defect, unspecified: Secondary | ICD-10-CM | POA: Diagnosis not present

## 2023-09-08 DIAGNOSIS — N186 End stage renal disease: Secondary | ICD-10-CM | POA: Diagnosis not present

## 2023-09-08 DIAGNOSIS — E1165 Type 2 diabetes mellitus with hyperglycemia: Secondary | ICD-10-CM | POA: Diagnosis not present

## 2023-09-09 DIAGNOSIS — E1165 Type 2 diabetes mellitus with hyperglycemia: Secondary | ICD-10-CM | POA: Diagnosis not present

## 2023-09-10 DIAGNOSIS — E1165 Type 2 diabetes mellitus with hyperglycemia: Secondary | ICD-10-CM | POA: Diagnosis not present

## 2023-09-11 DIAGNOSIS — D631 Anemia in chronic kidney disease: Secondary | ICD-10-CM | POA: Diagnosis not present

## 2023-09-11 DIAGNOSIS — D509 Iron deficiency anemia, unspecified: Secondary | ICD-10-CM | POA: Diagnosis not present

## 2023-09-11 DIAGNOSIS — D689 Coagulation defect, unspecified: Secondary | ICD-10-CM | POA: Diagnosis not present

## 2023-09-11 DIAGNOSIS — E1165 Type 2 diabetes mellitus with hyperglycemia: Secondary | ICD-10-CM | POA: Diagnosis not present

## 2023-09-11 DIAGNOSIS — Z992 Dependence on renal dialysis: Secondary | ICD-10-CM | POA: Diagnosis not present

## 2023-09-11 DIAGNOSIS — N186 End stage renal disease: Secondary | ICD-10-CM | POA: Diagnosis not present

## 2023-09-11 DIAGNOSIS — N2581 Secondary hyperparathyroidism of renal origin: Secondary | ICD-10-CM | POA: Diagnosis not present

## 2023-09-12 DIAGNOSIS — E1165 Type 2 diabetes mellitus with hyperglycemia: Secondary | ICD-10-CM | POA: Diagnosis not present

## 2023-09-13 DIAGNOSIS — E1165 Type 2 diabetes mellitus with hyperglycemia: Secondary | ICD-10-CM | POA: Diagnosis not present

## 2023-09-14 ENCOUNTER — Encounter (HOSPITAL_COMMUNITY): Payer: Self-pay

## 2023-09-14 ENCOUNTER — Ambulatory Visit (HOSPITAL_COMMUNITY): Admit: 2023-09-14 | Admitting: Vascular Surgery

## 2023-09-14 DIAGNOSIS — E1165 Type 2 diabetes mellitus with hyperglycemia: Secondary | ICD-10-CM | POA: Diagnosis not present

## 2023-09-14 SURGERY — A/V SHUNT INTERVENTION
Anesthesia: LOCAL | Site: Arm Upper | Laterality: Left

## 2023-09-15 DIAGNOSIS — E1165 Type 2 diabetes mellitus with hyperglycemia: Secondary | ICD-10-CM | POA: Diagnosis not present

## 2023-09-15 DIAGNOSIS — D631 Anemia in chronic kidney disease: Secondary | ICD-10-CM | POA: Diagnosis not present

## 2023-09-15 DIAGNOSIS — N2581 Secondary hyperparathyroidism of renal origin: Secondary | ICD-10-CM | POA: Diagnosis not present

## 2023-09-15 DIAGNOSIS — D509 Iron deficiency anemia, unspecified: Secondary | ICD-10-CM | POA: Diagnosis not present

## 2023-09-15 DIAGNOSIS — Z992 Dependence on renal dialysis: Secondary | ICD-10-CM | POA: Diagnosis not present

## 2023-09-15 DIAGNOSIS — D689 Coagulation defect, unspecified: Secondary | ICD-10-CM | POA: Diagnosis not present

## 2023-09-15 DIAGNOSIS — N186 End stage renal disease: Secondary | ICD-10-CM | POA: Diagnosis not present

## 2023-09-16 DIAGNOSIS — E1165 Type 2 diabetes mellitus with hyperglycemia: Secondary | ICD-10-CM | POA: Diagnosis not present

## 2023-09-17 DIAGNOSIS — E1165 Type 2 diabetes mellitus with hyperglycemia: Secondary | ICD-10-CM | POA: Diagnosis not present

## 2023-09-18 DIAGNOSIS — D509 Iron deficiency anemia, unspecified: Secondary | ICD-10-CM | POA: Diagnosis not present

## 2023-09-18 DIAGNOSIS — N2581 Secondary hyperparathyroidism of renal origin: Secondary | ICD-10-CM | POA: Diagnosis not present

## 2023-09-18 DIAGNOSIS — Z992 Dependence on renal dialysis: Secondary | ICD-10-CM | POA: Diagnosis not present

## 2023-09-18 DIAGNOSIS — N186 End stage renal disease: Secondary | ICD-10-CM | POA: Diagnosis not present

## 2023-09-18 DIAGNOSIS — E1122 Type 2 diabetes mellitus with diabetic chronic kidney disease: Secondary | ICD-10-CM | POA: Diagnosis not present

## 2023-09-18 DIAGNOSIS — D689 Coagulation defect, unspecified: Secondary | ICD-10-CM | POA: Diagnosis not present

## 2023-09-18 DIAGNOSIS — E1165 Type 2 diabetes mellitus with hyperglycemia: Secondary | ICD-10-CM | POA: Diagnosis not present

## 2023-09-18 DIAGNOSIS — R110 Nausea: Secondary | ICD-10-CM | POA: Diagnosis not present

## 2023-09-19 DIAGNOSIS — E1165 Type 2 diabetes mellitus with hyperglycemia: Secondary | ICD-10-CM | POA: Diagnosis not present

## 2023-09-20 DIAGNOSIS — R110 Nausea: Secondary | ICD-10-CM | POA: Diagnosis not present

## 2023-09-20 DIAGNOSIS — D689 Coagulation defect, unspecified: Secondary | ICD-10-CM | POA: Diagnosis not present

## 2023-09-20 DIAGNOSIS — Z992 Dependence on renal dialysis: Secondary | ICD-10-CM | POA: Diagnosis not present

## 2023-09-20 DIAGNOSIS — N186 End stage renal disease: Secondary | ICD-10-CM | POA: Diagnosis not present

## 2023-09-20 DIAGNOSIS — E1165 Type 2 diabetes mellitus with hyperglycemia: Secondary | ICD-10-CM | POA: Diagnosis not present

## 2023-09-20 DIAGNOSIS — D509 Iron deficiency anemia, unspecified: Secondary | ICD-10-CM | POA: Diagnosis not present

## 2023-09-20 DIAGNOSIS — E1122 Type 2 diabetes mellitus with diabetic chronic kidney disease: Secondary | ICD-10-CM | POA: Diagnosis not present

## 2023-09-20 DIAGNOSIS — N2581 Secondary hyperparathyroidism of renal origin: Secondary | ICD-10-CM | POA: Diagnosis not present

## 2023-09-21 DIAGNOSIS — E1165 Type 2 diabetes mellitus with hyperglycemia: Secondary | ICD-10-CM | POA: Diagnosis not present

## 2023-09-22 DIAGNOSIS — R110 Nausea: Secondary | ICD-10-CM | POA: Diagnosis not present

## 2023-09-22 DIAGNOSIS — D689 Coagulation defect, unspecified: Secondary | ICD-10-CM | POA: Diagnosis not present

## 2023-09-22 DIAGNOSIS — D509 Iron deficiency anemia, unspecified: Secondary | ICD-10-CM | POA: Diagnosis not present

## 2023-09-22 DIAGNOSIS — N186 End stage renal disease: Secondary | ICD-10-CM | POA: Diagnosis not present

## 2023-09-22 DIAGNOSIS — N2581 Secondary hyperparathyroidism of renal origin: Secondary | ICD-10-CM | POA: Diagnosis not present

## 2023-09-22 DIAGNOSIS — E1122 Type 2 diabetes mellitus with diabetic chronic kidney disease: Secondary | ICD-10-CM | POA: Diagnosis not present

## 2023-09-22 DIAGNOSIS — E1165 Type 2 diabetes mellitus with hyperglycemia: Secondary | ICD-10-CM | POA: Diagnosis not present

## 2023-09-22 DIAGNOSIS — Z992 Dependence on renal dialysis: Secondary | ICD-10-CM | POA: Diagnosis not present

## 2023-09-23 DIAGNOSIS — E1165 Type 2 diabetes mellitus with hyperglycemia: Secondary | ICD-10-CM | POA: Diagnosis not present

## 2023-09-24 DIAGNOSIS — E1165 Type 2 diabetes mellitus with hyperglycemia: Secondary | ICD-10-CM | POA: Diagnosis not present

## 2023-09-25 DIAGNOSIS — D689 Coagulation defect, unspecified: Secondary | ICD-10-CM | POA: Diagnosis not present

## 2023-09-25 DIAGNOSIS — E1165 Type 2 diabetes mellitus with hyperglycemia: Secondary | ICD-10-CM | POA: Diagnosis not present

## 2023-09-25 DIAGNOSIS — N2581 Secondary hyperparathyroidism of renal origin: Secondary | ICD-10-CM | POA: Diagnosis not present

## 2023-09-25 DIAGNOSIS — N186 End stage renal disease: Secondary | ICD-10-CM | POA: Diagnosis not present

## 2023-09-25 DIAGNOSIS — Z992 Dependence on renal dialysis: Secondary | ICD-10-CM | POA: Diagnosis not present

## 2023-09-26 DIAGNOSIS — E1165 Type 2 diabetes mellitus with hyperglycemia: Secondary | ICD-10-CM | POA: Diagnosis not present

## 2023-09-27 DIAGNOSIS — N2581 Secondary hyperparathyroidism of renal origin: Secondary | ICD-10-CM | POA: Diagnosis not present

## 2023-09-27 DIAGNOSIS — N186 End stage renal disease: Secondary | ICD-10-CM | POA: Diagnosis not present

## 2023-09-27 DIAGNOSIS — E1165 Type 2 diabetes mellitus with hyperglycemia: Secondary | ICD-10-CM | POA: Diagnosis not present

## 2023-09-27 DIAGNOSIS — Z992 Dependence on renal dialysis: Secondary | ICD-10-CM | POA: Diagnosis not present

## 2023-09-27 DIAGNOSIS — D689 Coagulation defect, unspecified: Secondary | ICD-10-CM | POA: Diagnosis not present

## 2023-09-28 DIAGNOSIS — E1165 Type 2 diabetes mellitus with hyperglycemia: Secondary | ICD-10-CM | POA: Diagnosis not present

## 2023-09-28 DIAGNOSIS — Z992 Dependence on renal dialysis: Secondary | ICD-10-CM | POA: Diagnosis not present

## 2023-09-28 DIAGNOSIS — N186 End stage renal disease: Secondary | ICD-10-CM | POA: Diagnosis not present

## 2023-09-29 DIAGNOSIS — E1165 Type 2 diabetes mellitus with hyperglycemia: Secondary | ICD-10-CM | POA: Diagnosis not present

## 2023-09-29 DIAGNOSIS — N2581 Secondary hyperparathyroidism of renal origin: Secondary | ICD-10-CM | POA: Diagnosis not present

## 2023-09-29 DIAGNOSIS — Z992 Dependence on renal dialysis: Secondary | ICD-10-CM | POA: Diagnosis not present

## 2023-09-29 DIAGNOSIS — N186 End stage renal disease: Secondary | ICD-10-CM | POA: Diagnosis not present

## 2023-09-29 DIAGNOSIS — D689 Coagulation defect, unspecified: Secondary | ICD-10-CM | POA: Diagnosis not present

## 2023-09-30 DIAGNOSIS — E1165 Type 2 diabetes mellitus with hyperglycemia: Secondary | ICD-10-CM | POA: Diagnosis not present

## 2023-10-01 DIAGNOSIS — E1165 Type 2 diabetes mellitus with hyperglycemia: Secondary | ICD-10-CM | POA: Diagnosis not present

## 2023-10-02 DIAGNOSIS — D689 Coagulation defect, unspecified: Secondary | ICD-10-CM | POA: Diagnosis not present

## 2023-10-02 DIAGNOSIS — N2581 Secondary hyperparathyroidism of renal origin: Secondary | ICD-10-CM | POA: Diagnosis not present

## 2023-10-02 DIAGNOSIS — N186 End stage renal disease: Secondary | ICD-10-CM | POA: Diagnosis not present

## 2023-10-02 DIAGNOSIS — E1165 Type 2 diabetes mellitus with hyperglycemia: Secondary | ICD-10-CM | POA: Diagnosis not present

## 2023-10-02 DIAGNOSIS — Z992 Dependence on renal dialysis: Secondary | ICD-10-CM | POA: Diagnosis not present

## 2023-10-03 DIAGNOSIS — E1165 Type 2 diabetes mellitus with hyperglycemia: Secondary | ICD-10-CM | POA: Diagnosis not present

## 2023-10-04 DIAGNOSIS — Z992 Dependence on renal dialysis: Secondary | ICD-10-CM | POA: Diagnosis not present

## 2023-10-04 DIAGNOSIS — E1165 Type 2 diabetes mellitus with hyperglycemia: Secondary | ICD-10-CM | POA: Diagnosis not present

## 2023-10-04 DIAGNOSIS — N2581 Secondary hyperparathyroidism of renal origin: Secondary | ICD-10-CM | POA: Diagnosis not present

## 2023-10-04 DIAGNOSIS — D689 Coagulation defect, unspecified: Secondary | ICD-10-CM | POA: Diagnosis not present

## 2023-10-04 DIAGNOSIS — N186 End stage renal disease: Secondary | ICD-10-CM | POA: Diagnosis not present

## 2023-10-05 DIAGNOSIS — E1165 Type 2 diabetes mellitus with hyperglycemia: Secondary | ICD-10-CM | POA: Diagnosis not present

## 2023-10-06 DIAGNOSIS — N186 End stage renal disease: Secondary | ICD-10-CM | POA: Diagnosis not present

## 2023-10-06 DIAGNOSIS — N2581 Secondary hyperparathyroidism of renal origin: Secondary | ICD-10-CM | POA: Diagnosis not present

## 2023-10-06 DIAGNOSIS — Z992 Dependence on renal dialysis: Secondary | ICD-10-CM | POA: Diagnosis not present

## 2023-10-06 DIAGNOSIS — E1165 Type 2 diabetes mellitus with hyperglycemia: Secondary | ICD-10-CM | POA: Diagnosis not present

## 2023-10-06 DIAGNOSIS — D689 Coagulation defect, unspecified: Secondary | ICD-10-CM | POA: Diagnosis not present

## 2023-10-07 DIAGNOSIS — E1165 Type 2 diabetes mellitus with hyperglycemia: Secondary | ICD-10-CM | POA: Diagnosis not present

## 2023-10-08 DIAGNOSIS — Z01818 Encounter for other preprocedural examination: Secondary | ICD-10-CM | POA: Diagnosis not present

## 2023-10-08 DIAGNOSIS — N186 End stage renal disease: Secondary | ICD-10-CM | POA: Diagnosis not present

## 2023-10-08 DIAGNOSIS — E1165 Type 2 diabetes mellitus with hyperglycemia: Secondary | ICD-10-CM | POA: Diagnosis not present

## 2023-10-09 DIAGNOSIS — Z992 Dependence on renal dialysis: Secondary | ICD-10-CM | POA: Diagnosis not present

## 2023-10-09 DIAGNOSIS — E1165 Type 2 diabetes mellitus with hyperglycemia: Secondary | ICD-10-CM | POA: Diagnosis not present

## 2023-10-09 DIAGNOSIS — N186 End stage renal disease: Secondary | ICD-10-CM | POA: Diagnosis not present

## 2023-10-09 DIAGNOSIS — D631 Anemia in chronic kidney disease: Secondary | ICD-10-CM | POA: Diagnosis not present

## 2023-10-09 DIAGNOSIS — D689 Coagulation defect, unspecified: Secondary | ICD-10-CM | POA: Diagnosis not present

## 2023-10-09 DIAGNOSIS — Z01818 Encounter for other preprocedural examination: Secondary | ICD-10-CM | POA: Diagnosis not present

## 2023-10-09 DIAGNOSIS — N2581 Secondary hyperparathyroidism of renal origin: Secondary | ICD-10-CM | POA: Diagnosis not present

## 2023-10-10 DIAGNOSIS — E1165 Type 2 diabetes mellitus with hyperglycemia: Secondary | ICD-10-CM | POA: Diagnosis not present

## 2023-10-11 DIAGNOSIS — E1165 Type 2 diabetes mellitus with hyperglycemia: Secondary | ICD-10-CM | POA: Diagnosis not present

## 2023-10-11 DIAGNOSIS — D631 Anemia in chronic kidney disease: Secondary | ICD-10-CM | POA: Diagnosis not present

## 2023-10-11 DIAGNOSIS — Z992 Dependence on renal dialysis: Secondary | ICD-10-CM | POA: Diagnosis not present

## 2023-10-11 DIAGNOSIS — D689 Coagulation defect, unspecified: Secondary | ICD-10-CM | POA: Diagnosis not present

## 2023-10-11 DIAGNOSIS — N186 End stage renal disease: Secondary | ICD-10-CM | POA: Diagnosis not present

## 2023-10-11 DIAGNOSIS — N2581 Secondary hyperparathyroidism of renal origin: Secondary | ICD-10-CM | POA: Diagnosis not present

## 2023-10-12 DIAGNOSIS — E1165 Type 2 diabetes mellitus with hyperglycemia: Secondary | ICD-10-CM | POA: Diagnosis not present

## 2023-10-12 DIAGNOSIS — Z01818 Encounter for other preprocedural examination: Secondary | ICD-10-CM | POA: Diagnosis not present

## 2023-10-13 DIAGNOSIS — Z992 Dependence on renal dialysis: Secondary | ICD-10-CM | POA: Diagnosis not present

## 2023-10-13 DIAGNOSIS — D689 Coagulation defect, unspecified: Secondary | ICD-10-CM | POA: Diagnosis not present

## 2023-10-13 DIAGNOSIS — N186 End stage renal disease: Secondary | ICD-10-CM | POA: Diagnosis not present

## 2023-10-13 DIAGNOSIS — N2581 Secondary hyperparathyroidism of renal origin: Secondary | ICD-10-CM | POA: Diagnosis not present

## 2023-10-13 DIAGNOSIS — D631 Anemia in chronic kidney disease: Secondary | ICD-10-CM | POA: Diagnosis not present

## 2023-10-13 DIAGNOSIS — E1165 Type 2 diabetes mellitus with hyperglycemia: Secondary | ICD-10-CM | POA: Diagnosis not present

## 2023-10-14 DIAGNOSIS — E1165 Type 2 diabetes mellitus with hyperglycemia: Secondary | ICD-10-CM | POA: Diagnosis not present

## 2023-10-15 DIAGNOSIS — R7309 Other abnormal glucose: Secondary | ICD-10-CM | POA: Diagnosis not present

## 2023-10-15 DIAGNOSIS — G629 Polyneuropathy, unspecified: Secondary | ICD-10-CM | POA: Diagnosis not present

## 2023-10-15 DIAGNOSIS — E1165 Type 2 diabetes mellitus with hyperglycemia: Secondary | ICD-10-CM | POA: Diagnosis not present

## 2023-10-15 DIAGNOSIS — I150 Renovascular hypertension: Secondary | ICD-10-CM | POA: Diagnosis not present

## 2023-10-15 DIAGNOSIS — I953 Hypotension of hemodialysis: Secondary | ICD-10-CM | POA: Diagnosis not present

## 2023-10-15 DIAGNOSIS — N186 End stage renal disease: Secondary | ICD-10-CM | POA: Diagnosis not present

## 2023-10-15 DIAGNOSIS — Z6836 Body mass index (BMI) 36.0-36.9, adult: Secondary | ICD-10-CM | POA: Diagnosis not present

## 2023-10-15 DIAGNOSIS — H5410 Blindness, one eye, low vision other eye, unspecified eyes: Secondary | ICD-10-CM | POA: Diagnosis not present

## 2023-10-15 DIAGNOSIS — I5030 Unspecified diastolic (congestive) heart failure: Secondary | ICD-10-CM | POA: Diagnosis not present

## 2023-10-15 DIAGNOSIS — I120 Hypertensive chronic kidney disease with stage 5 chronic kidney disease or end stage renal disease: Secondary | ICD-10-CM | POA: Diagnosis not present

## 2023-10-15 DIAGNOSIS — Z833 Family history of diabetes mellitus: Secondary | ICD-10-CM | POA: Diagnosis not present

## 2023-10-15 DIAGNOSIS — E66812 Obesity, class 2: Secondary | ICD-10-CM | POA: Diagnosis not present

## 2023-10-15 DIAGNOSIS — Z01818 Encounter for other preprocedural examination: Secondary | ICD-10-CM | POA: Diagnosis not present

## 2023-10-15 DIAGNOSIS — Z79899 Other long term (current) drug therapy: Secondary | ICD-10-CM | POA: Diagnosis not present

## 2023-10-15 DIAGNOSIS — E1122 Type 2 diabetes mellitus with diabetic chronic kidney disease: Secondary | ICD-10-CM | POA: Diagnosis not present

## 2023-10-15 DIAGNOSIS — Z8489 Family history of other specified conditions: Secondary | ICD-10-CM | POA: Diagnosis not present

## 2023-10-15 DIAGNOSIS — L910 Hypertrophic scar: Secondary | ICD-10-CM | POA: Diagnosis not present

## 2023-10-15 DIAGNOSIS — E782 Mixed hyperlipidemia: Secondary | ICD-10-CM | POA: Diagnosis not present

## 2023-10-15 DIAGNOSIS — I5032 Chronic diastolic (congestive) heart failure: Secondary | ICD-10-CM | POA: Diagnosis not present

## 2023-10-15 DIAGNOSIS — Z992 Dependence on renal dialysis: Secondary | ICD-10-CM | POA: Diagnosis not present

## 2023-10-15 DIAGNOSIS — Z823 Family history of stroke: Secondary | ICD-10-CM | POA: Diagnosis not present

## 2023-10-15 DIAGNOSIS — Z794 Long term (current) use of insulin: Secondary | ICD-10-CM | POA: Diagnosis not present

## 2023-10-16 DIAGNOSIS — N2581 Secondary hyperparathyroidism of renal origin: Secondary | ICD-10-CM | POA: Diagnosis not present

## 2023-10-16 DIAGNOSIS — N186 End stage renal disease: Secondary | ICD-10-CM | POA: Diagnosis not present

## 2023-10-16 DIAGNOSIS — Z992 Dependence on renal dialysis: Secondary | ICD-10-CM | POA: Diagnosis not present

## 2023-10-16 DIAGNOSIS — E1165 Type 2 diabetes mellitus with hyperglycemia: Secondary | ICD-10-CM | POA: Diagnosis not present

## 2023-10-16 DIAGNOSIS — D689 Coagulation defect, unspecified: Secondary | ICD-10-CM | POA: Diagnosis not present

## 2023-10-16 DIAGNOSIS — E039 Hypothyroidism, unspecified: Secondary | ICD-10-CM | POA: Diagnosis not present

## 2023-10-16 DIAGNOSIS — E1122 Type 2 diabetes mellitus with diabetic chronic kidney disease: Secondary | ICD-10-CM | POA: Diagnosis not present

## 2023-10-17 DIAGNOSIS — E1165 Type 2 diabetes mellitus with hyperglycemia: Secondary | ICD-10-CM | POA: Diagnosis not present

## 2023-10-18 DIAGNOSIS — N2581 Secondary hyperparathyroidism of renal origin: Secondary | ICD-10-CM | POA: Diagnosis not present

## 2023-10-18 DIAGNOSIS — E1122 Type 2 diabetes mellitus with diabetic chronic kidney disease: Secondary | ICD-10-CM | POA: Diagnosis not present

## 2023-10-18 DIAGNOSIS — Z01818 Encounter for other preprocedural examination: Secondary | ICD-10-CM | POA: Diagnosis not present

## 2023-10-18 DIAGNOSIS — E039 Hypothyroidism, unspecified: Secondary | ICD-10-CM | POA: Diagnosis not present

## 2023-10-18 DIAGNOSIS — N186 End stage renal disease: Secondary | ICD-10-CM | POA: Diagnosis not present

## 2023-10-18 DIAGNOSIS — D689 Coagulation defect, unspecified: Secondary | ICD-10-CM | POA: Diagnosis not present

## 2023-10-18 DIAGNOSIS — E1165 Type 2 diabetes mellitus with hyperglycemia: Secondary | ICD-10-CM | POA: Diagnosis not present

## 2023-10-18 DIAGNOSIS — Z992 Dependence on renal dialysis: Secondary | ICD-10-CM | POA: Diagnosis not present

## 2023-10-19 DIAGNOSIS — E1165 Type 2 diabetes mellitus with hyperglycemia: Secondary | ICD-10-CM | POA: Diagnosis not present

## 2023-10-20 DIAGNOSIS — D689 Coagulation defect, unspecified: Secondary | ICD-10-CM | POA: Diagnosis not present

## 2023-10-20 DIAGNOSIS — N186 End stage renal disease: Secondary | ICD-10-CM | POA: Diagnosis not present

## 2023-10-20 DIAGNOSIS — E039 Hypothyroidism, unspecified: Secondary | ICD-10-CM | POA: Diagnosis not present

## 2023-10-20 DIAGNOSIS — E1122 Type 2 diabetes mellitus with diabetic chronic kidney disease: Secondary | ICD-10-CM | POA: Diagnosis not present

## 2023-10-20 DIAGNOSIS — E1165 Type 2 diabetes mellitus with hyperglycemia: Secondary | ICD-10-CM | POA: Diagnosis not present

## 2023-10-20 DIAGNOSIS — Z992 Dependence on renal dialysis: Secondary | ICD-10-CM | POA: Diagnosis not present

## 2023-10-20 DIAGNOSIS — N2581 Secondary hyperparathyroidism of renal origin: Secondary | ICD-10-CM | POA: Diagnosis not present

## 2023-10-21 DIAGNOSIS — E1165 Type 2 diabetes mellitus with hyperglycemia: Secondary | ICD-10-CM | POA: Diagnosis not present

## 2023-10-22 ENCOUNTER — Ambulatory Visit: Admitting: Family Medicine

## 2023-10-22 VITALS — BP 96/66 | HR 79 | Temp 97.2°F | Ht 62.0 in

## 2023-10-22 DIAGNOSIS — N186 End stage renal disease: Secondary | ICD-10-CM | POA: Diagnosis not present

## 2023-10-22 DIAGNOSIS — E785 Hyperlipidemia, unspecified: Secondary | ICD-10-CM

## 2023-10-22 DIAGNOSIS — Z794 Long term (current) use of insulin: Secondary | ICD-10-CM

## 2023-10-22 DIAGNOSIS — E1169 Type 2 diabetes mellitus with other specified complication: Secondary | ICD-10-CM | POA: Diagnosis not present

## 2023-10-22 DIAGNOSIS — E1165 Type 2 diabetes mellitus with hyperglycemia: Secondary | ICD-10-CM | POA: Diagnosis not present

## 2023-10-22 MED ORDER — FREESTYLE LIBRE 3 PLUS SENSOR MISC: 3 refills | Status: AC

## 2023-10-22 MED ORDER — LEVOTHYROXINE SODIUM 50 MCG PO TABS: ORAL_TABLET | ORAL | 0 refills | Status: AC

## 2023-10-22 MED ORDER — INSULIN GLARGINE 100 UNIT/ML SOLOSTAR PEN
10.0000 [IU] | PEN_INJECTOR | Freq: Every day | SUBCUTANEOUS | 1 refills | Status: DC
Start: 2023-10-22 — End: 2023-11-01

## 2023-10-22 NOTE — Progress Notes (Unsigned)
 Office Note     CC:  follow up Requesting Provider:  Rachele Gaynell RAMAN, MD  HPI: Ana [name] is a 53 y.o. (27-Dec-1970) female who presents for evaluation of left arm AV fistula due to a burning sensation over the fistula.  She underwent left brachiobasilic fistula creation on 09/05/2022 by Dr. Lanis.  When the appointment was made she was having a burning sensation over her fistula.  Over the past several weeks they have been rotating needle sites more often and she is no longer experiencing the sensation.  She believes the fistula is functioning appropriately on dialysis.  Her main concern now is related to her blood pressure dropping during her treatments.  She will discuss this with her nephrologist and dialysis center directly.  She is dialyzing on a Monday Wednesday Friday schedule at [facility].   Past Medical History:  Diagnosis Date   (HFpEF) heart failure with preserved ejection fraction (HCC)    a. echo in 10/2021 showing preserved EF of 60-65% with Grade 2 DD and small pericardial effusion.   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   Diabetes mellitus    type 2, diet controlled   Hypertension     Past Surgical History:  Procedure Laterality Date   AV FISTULA PLACEMENT Left 09/05/2022   Procedure: LEFT BRACHIO BASILIC ARTERIOVENOUS (AV) FISTULA CREATION;  Surgeon: Lanis Fonda BRAVO, MD;  Location: [facility] OR;  Service: Vascular;  Laterality: Left;   BASCILIC VEIN TRANSPOSITION Left 11/13/2022   Procedure: LEFT ARM SECOND STAGE BASILIC VEIN TRANSPOSITION;  Surgeon: Lanis Fonda BRAVO, MD;  Location: [facility] OR;  Service: Vascular;  Laterality: Left;   CESAREAN SECTION  x4   CHOLECYSTECTOMY     EYE SURGERY     INSERTION OF DIALYSIS CATHETER Right 09/05/2022   Procedure: INSERTION OF TUNNELED DIALYSIS CATHETER RIGHT INTERNAL JUIGULAR VEIN;  Surgeon: Lanis Fonda BRAVO, MD;  Location: [facility] OR;  Service: Vascular;  Laterality:  Right;  lma   IR REMOVAL TUN CV CATH W/O FL  01/19/2023   MASS EXCISION  10/27/2011   Procedure: EXCISION MASS;  Surgeon: Thresa JAYSON Pulling, MD;  Location: [facility] ORS;  Service: General;  Laterality: Right;   TUBAL LIGATION      Social History   Socioeconomic History   Marital status: Single    Spouse name: Not on file   Number of children: Not on file   Years of education: Not on file   Highest education level: Not on file  Occupational History   Not on file  Tobacco Use   Smoking status: Never   Smokeless tobacco: Never  Vaping Use   Vaping status: Never Used  Substance and Sexual Activity   Alcohol use: Not Currently    Comment: occ   Drug use: No   Sexual activity: Yes    Birth control/protection: Surgical  Other Topics Concern   Not on file  Social History Narrative   Divorced,married for 8 years.Lives with daughter and 3 grandkids.Unemployed.   Social Drivers of Corporate investment banker Strain: Not on file  Food Insecurity: Food Insecurity Present (11/24/2022)   Hunger Vital Sign    Worried About Running Out of Food in the Last Year: Sometimes true    Ran Out of Food in the Last Year: Sometimes true  Transportation Needs: No Transportation Needs (11/24/2022)   PRAPARE - Administrator, Civil Service (  Medical): No    Lack of Transportation (Non-Medical): No  Physical Activity: Not on file  Stress: Not on file  Social Connections: Not on file  Intimate Partner Violence: Not At Risk (11/24/2022)   Humiliation, Afraid, Rape, and Kick questionnaire    Fear of Current or Ex-Partner: No    Emotionally Abused: No    Physically Abused: No    Sexually Abused: No    Family History  Problem Relation Age of Onset   Kidney failure Mother    Diabetes Mother    Kidney disease Mother    Kidney failure Father    Kidney disease Father    Kidney failure Brother    Diabetes Brother    Diabetes Brother     Current Outpatient Medications  Medication Sig  Dispense Refill   amLODipine  (NORVASC ) 10 MG tablet TAKE 1 TABLET BY MOUTH DAILY (Patient taking differently: Take 10 mg by mouth daily.) 90 tablet 3   calcitRIOL (ROCALTROL) 0.25 MCG capsule Take 0.25 mcg by mouth daily.     carvedilol  (COREG ) 25 MG tablet Take 1 tablet (25 mg total) by mouth 2 (two) times daily. 180 tablet 3   Continuous Glucose Sensor (FREESTYLE LIBRE 3 PLUS SENSOR) MISC Use to check blood sugar. Change sensor every 15 days. 6 each 3   cyclobenzaprine  (FLEXERIL ) 5 MG tablet Take 1 tablet (5 mg total) by mouth 3 (three) times daily as needed for muscle spasms. 20 tablet 0   furosemide  (LASIX ) 40 MG tablet Take 2 tablets (80 mg total) by mouth 2 (two) times daily. 60 tablet 0   hydrALAZINE  (APRESOLINE ) 100 MG tablet Take 1 tablet (100 mg total) by mouth 3 (three) times daily. 270 tablet 3   insulin  glargine (LANTUS ) 100 UNIT/ML Solostar Pen Inject 10 Units into the skin daily. 15 mL 1   Insulin  Pen Needle 31G X 4 MM MISC Use to administer insulin  daily. 100 each 3   Lancet Device MISC Use up to 4 times daily to check blood sugar. May substitute to any manufacturer covered by patient's insurance. 1 each 0   levothyroxine  (SYNTHROID ) 50 MCG tablet TAKE 1 TABLET(50 MCG) BY MOUTH EVERY DAY 90 tablet 0   lidocaine -prilocaine (EMLA) cream Apply 1 Application topically daily as needed.     rosuvastatin  (CRESTOR ) 40 MG tablet Take 1 tablet (40 mg total) by mouth daily with supper. 90 tablet 3   No current facility-administered medications for this visit.    Allergies  Allergen Reactions   Ranitidine Hcl Rash     REVIEW OF SYSTEMS:   [X]  denotes positive finding, [ ]  denotes negative finding Cardiac  Comments:  Chest pain or chest pressure:    Shortness of breath upon exertion:    Short of breath when lying flat:    Irregular heart rhythm:        Vascular    Pain in calf, thigh, or hip brought on by ambulation:    Pain in feet at night that wakes you up from your sleep:      Blood clot in your veins:    Leg swelling:         Pulmonary    Oxygen at home:    Productive cough:     Wheezing:         Neurologic    Sudden weakness in arms or legs:     Sudden numbness in arms or legs:     Sudden onset of difficulty speaking or slurred speech:  Temporary loss of vision in one eye:     Problems with dizziness:         Gastrointestinal    Blood in stool:     Vomited blood:         Genitourinary    Burning when urinating:     Blood in urine:        Psychiatric    Major depression:         Hematologic    Bleeding problems:    Problems with blood clotting too easily:        Skin    Rashes or ulcers:        Constitutional    Fever or chills:      PHYSICAL EXAMINATION:  Vitals:   10/23/23 0924  BP: (!) 173/80  Pulse: 78  SpO2: 100%  Weight: 192 lb (87.1 kg)  Height: 5' 2 (1.575 m)    General:  WDWN in NAD; vital signs documented above Gait: Not observed HENT: WNL, normocephalic Pulmonary: normal non-labored breathing , without Rales, rhonchi,  wheezing Cardiac: regular HR Abdomen: soft, NT, no masses Skin: without rashes Vascular Exam/Pulses: Palpable thrill throughout left arm basilic vein fistula; palpable radial pulse Extremities: without ischemic changes, without Gangrene , without cellulitis; without open wounds;  Musculoskeletal: no muscle wasting or atrophy  Neurologic: A&O X 3 Psychiatric:  The pt has Normal affect.   Non-Invasive Vascular Imaging:   Widely patent fistula on duplex   ASSESSMENT/PLAN:: 53 y.o. female here for follow up for evaluation of left arm AV fistula due to a burning sensation  Burning sensation has resolved several weeks ago since the dialysis techs have been rotating the needle sites.  On exam she has a palpable thrill throughout her basilic vein fistula.  Her main concern is related to her blood pressure dropping during treatment which she plans to discuss directly with the dialysis center and her  nephrologist.  Nothing to offer from a vascular surgery standpoint.  She will follow-up on an as-needed basis.   Donnice Sender, PA-C [facility] [phone]

## 2023-10-22 NOTE — Patient Instructions (Signed)
 Referral placed.  Return in 1 month.

## 2023-10-22 NOTE — Assessment & Plan Note (Signed)
 CAP form filled out.

## 2023-10-22 NOTE — Progress Notes (Signed)
 Subjective:  Patient ID: Ana [name], female    DOB: [birth year]  Age: 53 y.o. MRN: [id number]  CC:   Chief Complaint  Patient presents with   CAP program paperwork    Needs to extend time and frequency of care   Hypotension    HPI:  53 year old female presents for evaluation of the above.  Patient has uncontrolled type 2 diabetes.  Noncompliant.  Has end-stage renal disease.  Has not been seen for follow-up in our office in almost a year.  I have placed referrals to ophthalmology as patient is essentially blind but she has not been seen.  Patient is in need CAP form to be filled out so that she may get additional services.  Recently seen by transplant team.  A1c on 8/5 was 10.1.  It does not appear that patient has been compliant and has not followed up.  She is not checking her blood sugars regularly.  I did not have any readings to evaluate.  Patient has had some issues with hypertension.  BP 96/66 today.   Patient Active Problem List   Diagnosis Date Noted   ESRD (end stage renal disease) (HCC) 09/25/2022   Poor vision 08/06/2022   (HFpEF) heart failure with preserved ejection fraction (HCC) 08/04/2022   Iron deficiency anemia 02/28/2022   Hypothyroidism, adult 09/02/2020   Vitamin D deficiency disease 09/02/2020   Type 2 diabetes mellitus with hyperlipidemia (HCC) 09/26/2017   Essential hypertension, benign 09/26/2017   Mixed hyperlipidemia 09/26/2017    Social Hx   Social History   Socioeconomic History   Marital status: Single    Spouse name: Not on file   Number of children: Not on file   Years of education: Not on file   Highest education level: Not on file  Occupational History   Not on file  Tobacco Use   Smoking status: Never   Smokeless tobacco: Never  Vaping Use   Vaping status: Never Used  Substance and Sexual Activity   Alcohol use: Not Currently    Comment: occ   Drug use: No   Sexual activity: Yes    Birth control/protection: Surgical   Other Topics Concern   Not on file  Social History Narrative   Divorced,married for 8 years.Lives with daughter and 3 grandkids.Unemployed.   Social Drivers of Corporate investment banker Strain: Not on file  Food Insecurity: Food Insecurity Present (11/24/2022)   Hunger Vital Sign    Worried About Running Out of Food in the Last Year: Sometimes true    Ran Out of Food in the Last Year: Sometimes true  Transportation Needs: No Transportation Needs (11/24/2022)   PRAPARE - Administrator, Civil Service (Medical): No    Lack of Transportation (Non-Medical): No  Physical Activity: Not on file  Stress: Not on file  Social Connections: Not on file    Review of Systems Per HPI  Objective:  BP 96/66   Pulse 79   Temp (!) 97.2 F (36.2 C)   Ht 5' 2 (1.575 m)   LMP 09/22/2019 (Approximate)   SpO2 100%   BMI 35.12 kg/m      10/22/2023    2:20 PM 10/22/2023    1:50 PM 06/15/2023    2:29 PM  BP/Weight  Systolic BP 96 99 114  Diastolic BP 66 56 71  Wt. (Lbs)   192  BMI   35.12 kg/m2    Physical Exam Vitals and nursing note reviewed.  Constitutional:      General: She is not in acute distress. HENT:     Head: Normocephalic and atraumatic.  Cardiovascular:     Rate and Rhythm: Normal rate and regular rhythm.  Pulmonary:     Effort: Pulmonary effort is normal. No respiratory distress.  Neurological:     Mental Status: She is alert.     Lab Results  Component Value Date   WBC 7.9 08/23/2022   HGB 10.5 (L) 11/13/2022   HCT 31.0 (L) 11/13/2022   PLT 290 08/23/2022   GLUCOSE 219 (H) 11/13/2022   CHOL 133 08/04/2022   TRIG 119 08/04/2022   HDL 42 08/04/2022   LDLCALC 70 08/04/2022   ALT 34 03/18/2022   AST 36 03/18/2022   NA 138 11/13/2022   K 3.4 (L) 11/13/2022   CL 93 (L) 11/13/2022   CREATININE 6.00 (H) 11/13/2022   BUN 34 (H) 11/13/2022   CO2 25 08/23/2022   TSH 2.013 03/18/2022   INR 1.0 12/22/2021   HGBA1C 10.9 (H) 08/04/2022      Assessment & Plan:  Type 2 diabetes mellitus with hyperlipidemia (HCC) Assessment & Plan: Noncompliant. Referring to Endo. Needs CGM (sending in today).   Orders: -     Ambulatory referral to Endocrinology  ESRD (end stage renal disease) (HCC) Assessment & Plan: CAP form filled out.   Orders: -     Insulin  Glargine; Inject 10 Units into the skin daily.  Dispense: 15 mL; Refill: 1 -     FreeStyle Libre 3 Plus Sensor; Use to check blood sugar. Change sensor every 15 days.  Dispense: 6 each; Refill: 3  Other orders -     Levothyroxine  Sodium; TAKE 1 TABLET(50 MCG) BY MOUTH EVERY DAY  Dispense: 90 tablet; Refill: 0    Follow-up:  1 month  Taite Schoeppner DO [facility]

## 2023-10-22 NOTE — Assessment & Plan Note (Signed)
 Noncompliant. Referring to Endo. Needs CGM (sending in today).

## 2023-10-23 ENCOUNTER — Ambulatory Visit (INDEPENDENT_AMBULATORY_CARE_PROVIDER_SITE_OTHER)

## 2023-10-23 ENCOUNTER — Telehealth: Payer: Self-pay | Admitting: Pharmacy Technician

## 2023-10-23 ENCOUNTER — Other Ambulatory Visit (HOSPITAL_COMMUNITY): Payer: Self-pay

## 2023-10-23 VITALS — BP 173/80 | HR 78 | Ht 62.0 in | Wt 192.0 lb

## 2023-10-23 DIAGNOSIS — Z992 Dependence on renal dialysis: Secondary | ICD-10-CM | POA: Diagnosis not present

## 2023-10-23 DIAGNOSIS — N186 End stage renal disease: Secondary | ICD-10-CM | POA: Diagnosis not present

## 2023-10-23 DIAGNOSIS — E1165 Type 2 diabetes mellitus with hyperglycemia: Secondary | ICD-10-CM | POA: Diagnosis not present

## 2023-10-23 NOTE — Telephone Encounter (Signed)
 Pharmacy Patient Advocate Encounter  Received notification from [employer] that Prior Authorization for FreeStyle Libre 3 Plus Sensor has been APPROVED from 10/23/2023 to 04/20/2024. Ran test claim, Copay is $0.00. This test claim was processed through [facility]- copay amounts may vary at other pharmacies due to pharmacy/plan contracts, or as the patient moves through the different stages of their insurance plan.   PA #/Case ID/Reference #: [id number]

## 2023-10-23 NOTE — Telephone Encounter (Signed)
 Pharmacy Patient Advocate Encounter   Received notification from Cover[employer]Meds that prior authorization for FreeStyle Libre 3 Plus Sensor is required/requested.   Insurance verification completed.   The patient is insured through [employer] .   Per test claim: PA required; PA submitted to above mentioned insurance via Latent Key/confirmation #/EOC [id number] Status is pending

## 2023-10-24 DIAGNOSIS — E1165 Type 2 diabetes mellitus with hyperglycemia: Secondary | ICD-10-CM | POA: Diagnosis not present

## 2023-10-25 DIAGNOSIS — D689 Coagulation defect, unspecified: Secondary | ICD-10-CM | POA: Diagnosis not present

## 2023-10-25 DIAGNOSIS — Z992 Dependence on renal dialysis: Secondary | ICD-10-CM | POA: Diagnosis not present

## 2023-10-25 DIAGNOSIS — N2581 Secondary hyperparathyroidism of renal origin: Secondary | ICD-10-CM | POA: Diagnosis not present

## 2023-10-25 DIAGNOSIS — N186 End stage renal disease: Secondary | ICD-10-CM | POA: Diagnosis not present

## 2023-10-25 DIAGNOSIS — E1165 Type 2 diabetes mellitus with hyperglycemia: Secondary | ICD-10-CM | POA: Diagnosis not present

## 2023-10-26 DIAGNOSIS — E1165 Type 2 diabetes mellitus with hyperglycemia: Secondary | ICD-10-CM | POA: Diagnosis not present

## 2023-10-27 DIAGNOSIS — D689 Coagulation defect, unspecified: Secondary | ICD-10-CM | POA: Diagnosis not present

## 2023-10-27 DIAGNOSIS — E1165 Type 2 diabetes mellitus with hyperglycemia: Secondary | ICD-10-CM | POA: Diagnosis not present

## 2023-10-27 DIAGNOSIS — Z992 Dependence on renal dialysis: Secondary | ICD-10-CM | POA: Diagnosis not present

## 2023-10-27 DIAGNOSIS — N2581 Secondary hyperparathyroidism of renal origin: Secondary | ICD-10-CM | POA: Diagnosis not present

## 2023-10-27 DIAGNOSIS — N186 End stage renal disease: Secondary | ICD-10-CM | POA: Diagnosis not present

## 2023-10-28 DIAGNOSIS — E1165 Type 2 diabetes mellitus with hyperglycemia: Secondary | ICD-10-CM | POA: Diagnosis not present

## 2023-10-29 DIAGNOSIS — E1165 Type 2 diabetes mellitus with hyperglycemia: Secondary | ICD-10-CM | POA: Diagnosis not present

## 2023-10-30 DIAGNOSIS — Z992 Dependence on renal dialysis: Secondary | ICD-10-CM | POA: Diagnosis not present

## 2023-10-30 DIAGNOSIS — D689 Coagulation defect, unspecified: Secondary | ICD-10-CM | POA: Diagnosis not present

## 2023-10-30 DIAGNOSIS — E1165 Type 2 diabetes mellitus with hyperglycemia: Secondary | ICD-10-CM | POA: Diagnosis not present

## 2023-10-30 DIAGNOSIS — N186 End stage renal disease: Secondary | ICD-10-CM | POA: Diagnosis not present

## 2023-10-30 DIAGNOSIS — N2581 Secondary hyperparathyroidism of renal origin: Secondary | ICD-10-CM | POA: Diagnosis not present

## 2023-10-31 DIAGNOSIS — E1165 Type 2 diabetes mellitus with hyperglycemia: Secondary | ICD-10-CM | POA: Diagnosis not present

## 2023-11-01 ENCOUNTER — Ambulatory Visit: Payer: Self-pay

## 2023-11-01 ENCOUNTER — Other Ambulatory Visit: Payer: Self-pay | Admitting: Family Medicine

## 2023-11-01 DIAGNOSIS — N186 End stage renal disease: Secondary | ICD-10-CM

## 2023-11-01 DIAGNOSIS — E1165 Type 2 diabetes mellitus with hyperglycemia: Secondary | ICD-10-CM | POA: Diagnosis not present

## 2023-11-01 DIAGNOSIS — D689 Coagulation defect, unspecified: Secondary | ICD-10-CM | POA: Diagnosis not present

## 2023-11-01 DIAGNOSIS — N2581 Secondary hyperparathyroidism of renal origin: Secondary | ICD-10-CM | POA: Diagnosis not present

## 2023-11-01 DIAGNOSIS — Z992 Dependence on renal dialysis: Secondary | ICD-10-CM | POA: Diagnosis not present

## 2023-11-01 MED ORDER — INSULIN GLARGINE 100 UNIT/ML SOLOSTAR PEN: 10.0000 [IU] | PEN_INJECTOR | Freq: Every day | SUBCUTANEOUS | 1 refills | Status: DC

## 2023-11-01 NOTE — Addendum Note (Signed)
 Addended by: Bronislaw Switzer M on: 11/01/2023 04:01 PM   Modules accepted: Orders

## 2023-11-01 NOTE — Telephone Encounter (Signed)
 Copied from CRM #[id number]. Topic: Clinical - Medication Refill >> Nov 01, 2023 11:33 AM Donna E wrote: Medication:  insulin  glargine (LANTUS ) 100 UNIT/ML Solostar Pen patient has been out of this medication for 2 days  Has the patient contacted their pharmacy? [facility]  This is the patient's preferred pharmacy:   [facility] [address] [place], [place] [address] Phone [phone]  Is this the correct pharmacy for this prescription? Yes If no, delete pharmacy and type the correct one.   Has the prescription been filled recently? Yes  Is the patient out of the medication? Yes  Has the patient been seen for an appointment in the last year OR does the patient have an upcoming appointment? Yes  Can we respond through MyChart? No  Agent: Please be advised that Rx refills may take up to 3 business days. We ask that you follow-up with your pharmacy.

## 2023-11-01 NOTE — Telephone Encounter (Signed)
 Spoke with patient.  [facility]antus  sent to [facility].

## 2023-11-01 NOTE — Telephone Encounter (Signed)
 Patient had requested insulin  and told PAS that she has been out for 2 days with a BS of 217.  This RN attempted to contact pt on preferred/home number (no answer) and mobile number (person who answered said that number belongs to her, a family member not on DPR).   This RN requested family member ask patient to call PCP office.

## 2023-11-01 NOTE — Telephone Encounter (Signed)
 FYI Only or Action Required?: Action required by provider: medication refill request.  Patient was last seen in primary care on 10/22/2023 by Cook, Ana G, DO.  Called Nurse Triage reporting Medication Refill.  Symptoms began yesterday.  Interventions attempted: Other: called for refill.  Symptoms are: glucose increasing.  Triage Disposition: Call PCP Now, Home Care  Patient/caregiver understands and will follow disposition?: Yes   Reason for Disposition  Blood glucose 70-240 mg/dL (3.9 -86.6 mmol/L)  [8] Prescription refill request for ESSENTIAL medicine (i.e., likelihood of harm to patient if not taken) AND [2] triager unable to refill per department policy  Answer Assessment - Initial Assessment Questions 1. BLOOD GLUCOSE: What is your blood glucose level?      217 2. ONSET: When did you check the blood glucose?     daily 3. USUAL RANGE: What is your glucose level usually? (e.[name]., usual fasting morning value, usual evening value)     Not disclosed 4. KETONES: Do you check for ketones (urine or blood test strips)? If Yes, ask: What does the test show now?       5. TYPE 1 or 2:  Do you know what type of diabetes you have?  (e.[name]., Type 1, Type 2, Gestational; doesn't know)       6. INSULIN : Do you take insulin ? What type of insulin (s) do you use? What is the mode of delivery? (syringe, pen; injection or pump)?      Glargine 7. DIABETES PILLS: Do you take any pills for your diabetes? If Yes, ask: Have you missed taking any pills recently?      8. OTHER SYMPTOMS: Do you have any symptoms? (e.[name]., fever, frequent urination, difficulty breathing, dizziness, weakness, vomiting)     denies  Answer Assessment - Initial Assessment Questions Additional info: Called [facility] to inform that patient is out of insulin  x 2 days, [facility], [facility], blood glucose is increasing.    1. DRUG NAME: What medicine do you need to have refilled?      insulin  2. REFILLS REMAINING: How many refills are remaining? Notes: The label on the medicine or pill bottle will show how many refills are remaining. If there are no refills remaining, then a renewal may be needed.     None-out of medication for two days 3. EXPIRATION DATE: What is the expiration date? Note: The label states when the prescription will expire, and thus can no longer be refilled.)      4. PRESCRIBER: Who prescribed it? Note: The prescribing doctor or group is responsible for refill approvals..      5. PHARMACY: Have you contacted your pharmacy (drugstore)? Note: Some pharmacies will contact the doctor (or NP/PA).      [facility] 6. SYMPTOMS: Do you have any symptoms?     denies 7. PREGNANCY: Is there any chance that you are pregnant? When was your last menstrual period?  Protocols used: Diabetes - High Blood Sugar-A-AH, Medication Refill and Renewal Call-A-AH

## 2023-11-02 DIAGNOSIS — E1165 Type 2 diabetes mellitus with hyperglycemia: Secondary | ICD-10-CM | POA: Diagnosis not present

## 2023-11-03 DIAGNOSIS — Z992 Dependence on renal dialysis: Secondary | ICD-10-CM | POA: Diagnosis not present

## 2023-11-03 DIAGNOSIS — N186 End stage renal disease: Secondary | ICD-10-CM | POA: Diagnosis not present

## 2023-11-03 DIAGNOSIS — D689 Coagulation defect, unspecified: Secondary | ICD-10-CM | POA: Diagnosis not present

## 2023-11-03 DIAGNOSIS — N2581 Secondary hyperparathyroidism of renal origin: Secondary | ICD-10-CM | POA: Diagnosis not present

## 2023-11-03 DIAGNOSIS — E1165 Type 2 diabetes mellitus with hyperglycemia: Secondary | ICD-10-CM | POA: Diagnosis not present

## 2023-11-04 DIAGNOSIS — E1165 Type 2 diabetes mellitus with hyperglycemia: Secondary | ICD-10-CM | POA: Diagnosis not present

## 2023-11-05 DIAGNOSIS — E1165 Type 2 diabetes mellitus with hyperglycemia: Secondary | ICD-10-CM | POA: Diagnosis not present

## 2023-11-06 DIAGNOSIS — N186 End stage renal disease: Secondary | ICD-10-CM | POA: Diagnosis not present

## 2023-11-06 DIAGNOSIS — E1165 Type 2 diabetes mellitus with hyperglycemia: Secondary | ICD-10-CM | POA: Diagnosis not present

## 2023-11-06 DIAGNOSIS — N2581 Secondary hyperparathyroidism of renal origin: Secondary | ICD-10-CM | POA: Diagnosis not present

## 2023-11-06 DIAGNOSIS — D631 Anemia in chronic kidney disease: Secondary | ICD-10-CM | POA: Diagnosis not present

## 2023-11-06 DIAGNOSIS — Z992 Dependence on renal dialysis: Secondary | ICD-10-CM | POA: Diagnosis not present

## 2023-11-06 DIAGNOSIS — D689 Coagulation defect, unspecified: Secondary | ICD-10-CM | POA: Diagnosis not present

## 2023-11-07 DIAGNOSIS — E1165 Type 2 diabetes mellitus with hyperglycemia: Secondary | ICD-10-CM | POA: Diagnosis not present

## 2023-11-08 DIAGNOSIS — E1165 Type 2 diabetes mellitus with hyperglycemia: Secondary | ICD-10-CM | POA: Diagnosis not present

## 2023-11-08 DIAGNOSIS — Z992 Dependence on renal dialysis: Secondary | ICD-10-CM | POA: Diagnosis not present

## 2023-11-08 DIAGNOSIS — D689 Coagulation defect, unspecified: Secondary | ICD-10-CM | POA: Diagnosis not present

## 2023-11-08 DIAGNOSIS — N2581 Secondary hyperparathyroidism of renal origin: Secondary | ICD-10-CM | POA: Diagnosis not present

## 2023-11-08 DIAGNOSIS — N186 End stage renal disease: Secondary | ICD-10-CM | POA: Diagnosis not present

## 2023-11-08 DIAGNOSIS — D631 Anemia in chronic kidney disease: Secondary | ICD-10-CM | POA: Diagnosis not present

## 2023-11-09 DIAGNOSIS — E1165 Type 2 diabetes mellitus with hyperglycemia: Secondary | ICD-10-CM | POA: Diagnosis not present

## 2023-11-10 DIAGNOSIS — D689 Coagulation defect, unspecified: Secondary | ICD-10-CM | POA: Diagnosis not present

## 2023-11-10 DIAGNOSIS — E1165 Type 2 diabetes mellitus with hyperglycemia: Secondary | ICD-10-CM | POA: Diagnosis not present

## 2023-11-10 DIAGNOSIS — N186 End stage renal disease: Secondary | ICD-10-CM | POA: Diagnosis not present

## 2023-11-10 DIAGNOSIS — Z992 Dependence on renal dialysis: Secondary | ICD-10-CM | POA: Diagnosis not present

## 2023-11-10 DIAGNOSIS — N2581 Secondary hyperparathyroidism of renal origin: Secondary | ICD-10-CM | POA: Diagnosis not present

## 2023-11-10 DIAGNOSIS — D631 Anemia in chronic kidney disease: Secondary | ICD-10-CM | POA: Diagnosis not present

## 2023-11-13 DIAGNOSIS — Z992 Dependence on renal dialysis: Secondary | ICD-10-CM | POA: Diagnosis not present

## 2023-11-13 DIAGNOSIS — N2581 Secondary hyperparathyroidism of renal origin: Secondary | ICD-10-CM | POA: Diagnosis not present

## 2023-11-13 DIAGNOSIS — E1122 Type 2 diabetes mellitus with diabetic chronic kidney disease: Secondary | ICD-10-CM | POA: Diagnosis not present

## 2023-11-13 DIAGNOSIS — D689 Coagulation defect, unspecified: Secondary | ICD-10-CM | POA: Diagnosis not present

## 2023-11-13 DIAGNOSIS — N186 End stage renal disease: Secondary | ICD-10-CM | POA: Diagnosis not present

## 2023-11-15 DIAGNOSIS — E1122 Type 2 diabetes mellitus with diabetic chronic kidney disease: Secondary | ICD-10-CM | POA: Diagnosis not present

## 2023-11-15 DIAGNOSIS — N2581 Secondary hyperparathyroidism of renal origin: Secondary | ICD-10-CM | POA: Diagnosis not present

## 2023-11-15 DIAGNOSIS — D689 Coagulation defect, unspecified: Secondary | ICD-10-CM | POA: Diagnosis not present

## 2023-11-15 DIAGNOSIS — N186 End stage renal disease: Secondary | ICD-10-CM | POA: Diagnosis not present

## 2023-11-15 DIAGNOSIS — Z992 Dependence on renal dialysis: Secondary | ICD-10-CM | POA: Diagnosis not present

## 2023-11-17 DIAGNOSIS — Z992 Dependence on renal dialysis: Secondary | ICD-10-CM | POA: Diagnosis not present

## 2023-11-17 DIAGNOSIS — N2581 Secondary hyperparathyroidism of renal origin: Secondary | ICD-10-CM | POA: Diagnosis not present

## 2023-11-17 DIAGNOSIS — E1122 Type 2 diabetes mellitus with diabetic chronic kidney disease: Secondary | ICD-10-CM | POA: Diagnosis not present

## 2023-11-17 DIAGNOSIS — D689 Coagulation defect, unspecified: Secondary | ICD-10-CM | POA: Diagnosis not present

## 2023-11-17 DIAGNOSIS — N186 End stage renal disease: Secondary | ICD-10-CM | POA: Diagnosis not present

## 2023-11-22 ENCOUNTER — Telehealth: Payer: Self-pay

## 2023-11-22 ENCOUNTER — Ambulatory Visit (INDEPENDENT_AMBULATORY_CARE_PROVIDER_SITE_OTHER): Admitting: Family Medicine

## 2023-11-22 VITALS — BP 138/86 | HR 82 | Ht 62.0 in | Wt 189.0 lb

## 2023-11-22 DIAGNOSIS — H547 Unspecified visual loss: Secondary | ICD-10-CM

## 2023-11-22 DIAGNOSIS — E785 Hyperlipidemia, unspecified: Secondary | ICD-10-CM | POA: Diagnosis not present

## 2023-11-22 DIAGNOSIS — Z794 Long term (current) use of insulin: Secondary | ICD-10-CM

## 2023-11-22 DIAGNOSIS — E1169 Type 2 diabetes mellitus with other specified complication: Secondary | ICD-10-CM | POA: Diagnosis not present

## 2023-11-22 DIAGNOSIS — I10 Essential (primary) hypertension: Secondary | ICD-10-CM | POA: Diagnosis not present

## 2023-11-22 DIAGNOSIS — N186 End stage renal disease: Secondary | ICD-10-CM

## 2023-11-22 MED ORDER — INSULIN GLARGINE 100 UNIT/ML SOLOSTAR PEN: 12.0000 [IU] | PEN_INJECTOR | Freq: Every day | SUBCUTANEOUS | Status: AC

## 2023-11-22 NOTE — Patient Instructions (Signed)
 Increase insulin  to 12 units.  Arranging appt with eye doctor.  Follow up in 3 months.

## 2023-11-22 NOTE — Telephone Encounter (Signed)
 Copied from CRM #[id number]. Topic: General - Other >> Nov 16, 2023  5:15 PM Corin V wrote: Reason for CRM: Brittney with [facility] calling in regards to fax sent 11/12/23 certificate of medical necessity and a physician's order for incontinence supplies. Not found in chart. Requested that she refax this. Please call when received. >> Nov 22, 2023 11:06 AM Ole G wrote: Elijah w/Home Care called checking status.. I did adv of turn time - callback [phone] >> Nov 19, 2023  8:14 AM Geni HERO wrote: Provider has paper work waiting on him to complete and forward to front.

## 2023-11-25 MED ORDER — HYDRALAZINE HCL 100 MG PO TABS: 100.0000 mg | ORAL_TABLET | Freq: Three times a day (TID) | ORAL | 3 refills | Status: AC

## 2023-11-25 MED ORDER — CARVEDILOL 25 MG PO TABS: 25.0000 mg | ORAL_TABLET | Freq: Two times a day (BID) | ORAL | 3 refills | Status: AC

## 2023-11-25 NOTE — Assessment & Plan Note (Signed)
Stable.  Meds refilled. 

## 2023-11-25 NOTE — Progress Notes (Signed)
 Subjective:  Patient ID: Ana [name], female    DOB: [birth year]  Age: 53 y.o. MRN: [id number]  CC:  Follow up   HPI:  53 year old female presents for follow up.  Has upcoming Endo appt. Reports that she has had issues with the Emily app on her phone.  Glucose readings not available to me today. She does report that her fasting blood sugars are improved but not at goal.  Patient overdue for many preventative healthcare items.  Declines immunizations today.   Still has not seen Ophthalmology.  Will place referral again.  HTN stable.   Patient Active Problem List   Diagnosis Date Noted   ESRD (end stage renal disease) (HCC) 09/25/2022   Poor vision 08/06/2022   (HFpEF) heart failure with preserved ejection fraction (HCC) 08/04/2022   Iron deficiency anemia 02/28/2022   Hypothyroidism, adult 09/02/2020   Vitamin D deficiency disease 09/02/2020   Type 2 diabetes mellitus with hyperlipidemia (HCC) 09/26/2017   Essential hypertension, benign 09/26/2017   Mixed hyperlipidemia 09/26/2017    Social Hx   Social History   Socioeconomic History   Marital status: Single    Spouse name: Not on file   Number of children: Not on file   Years of education: Not on file   Highest education level: Not on file  Occupational History   Not on file  Tobacco Use   Smoking status: Never   Smokeless tobacco: Never  Vaping Use   Vaping status: Never Used  Substance and Sexual Activity   Alcohol use: Not Currently    Comment: occ   Drug use: No   Sexual activity: Yes    Birth control/protection: Surgical  Other Topics Concern   Not on file  Social History Narrative   Divorced,married for 8 years.Lives with daughter and 3 grandkids.Unemployed.   Social Drivers of Corporate investment banker Strain: Not on file  Food Insecurity: Food Insecurity Present (11/24/2022)   Hunger Vital Sign    Worried About Running Out of Food in the Last Year: Sometimes true    Ran Out of Food in the  Last Year: Sometimes true  Transportation Needs: No Transportation Needs (11/24/2022)   PRAPARE - Administrator, Civil Service (Medical): No    Lack of Transportation (Non-Medical): No  Physical Activity: Not on file  Stress: Not on file  Social Connections: Not on file    Review of Systems Per HPI  Objective:  BP 138/86   Pulse 82   Ht 5' 2 (1.575 m)   Wt 189 lb (85.7 kg)   LMP 09/22/2019 (Approximate)   SpO2 99%   BMI 34.57 kg/m      11/22/2023   11:08 AM 10/23/2023    9:24 AM 10/22/2023    2:20 PM  BP/Weight  Systolic BP 138 173 96  Diastolic BP 86 80 66  Wt. (Lbs) 189 192   BMI 34.57 kg/m2 35.12 kg/m2     Physical Exam Vitals and nursing note reviewed.  Constitutional:      General: She is not in acute distress. HENT:     Head: Normocephalic and atraumatic.  Cardiovascular:     Rate and Rhythm: Normal rate and regular rhythm.  Pulmonary:     Effort: Pulmonary effort is normal.     Breath sounds: Normal breath sounds. No wheezing or rales.  Feet:     Comments: Diabetic foot exam performed today. See Quality metrics section. Neurological:  Mental Status: She is alert.  Psychiatric:        Mood and Affect: Mood normal.        Behavior: Behavior normal.     Lab Results  Component Value Date   WBC 7.9 08/23/2022   HGB 10.5 (L) 11/13/2022   HCT 31.0 (L) 11/13/2022   PLT 290 08/23/2022   GLUCOSE 219 (H) 11/13/2022   CHOL 133 08/04/2022   TRIG 119 08/04/2022   HDL 42 08/04/2022   LDLCALC 70 08/04/2022   ALT 34 03/18/2022   AST 36 03/18/2022   NA 138 11/13/2022   K 3.4 (L) 11/13/2022   CL 93 (L) 11/13/2022   CREATININE 6.00 (H) 11/13/2022   BUN 34 (H) 11/13/2022   CO2 25 08/23/2022   TSH 2.013 03/18/2022   INR 1.0 12/22/2021   HGBA1C 10.9 (H) 08/04/2022     Assessment & Plan:  Type 2 diabetes mellitus with hyperlipidemia (HCC) Assessment & Plan: Uncontrolled. Increasing Lantus  to 12 units.  Orders: -     Insulin  Glargine;  Inject 12 Units into the skin daily.  Poor vision -     Ambulatory referral to Ophthalmology  Essential hypertension, benign Assessment & Plan: Stable. Meds refilled.  Orders: -     Carvedilol ; Take 1 tablet (25 mg total) by mouth 2 (two) times daily.  Dispense: 180 tablet; Refill: 3 -     hydrALAZINE  HCl; Take 1 tablet (100 mg total) by mouth 3 (three) times daily.  Dispense: 270 tablet; Refill: 3    Follow-up:  Return in about 3 months (around 02/21/2024).  Jacqulyn Ahle DO [facility]

## 2023-11-25 NOTE — Assessment & Plan Note (Signed)
 Uncontrolled. Increasing Lantus  to 12 units.

## 2023-11-26 ENCOUNTER — Ambulatory Visit: Admitting: "Endocrinology

## 2023-11-28 ENCOUNTER — Telehealth: Payer: Self-pay | Admitting: Family Medicine

## 2023-11-28 DIAGNOSIS — N186 End stage renal disease: Secondary | ICD-10-CM | POA: Diagnosis not present

## 2023-11-28 DIAGNOSIS — Z992 Dependence on renal dialysis: Secondary | ICD-10-CM | POA: Diagnosis not present

## 2023-11-28 NOTE — Telephone Encounter (Unsigned)
 Copied from CRM #[id number]. Topic: General - Other >> Nov 28, 2023 11:15 AM Donee H wrote: Reason for CRM: Rashaad from [facility] calling regarding fax that was sent for certificate of medical necessity and a physician's order for incontinence supples. He wants to know if there are any updates and when to expect forms back. [phone]

## 2023-12-04 ENCOUNTER — Telehealth: Payer: Self-pay | Admitting: *Deleted

## 2023-12-04 DIAGNOSIS — I10 Essential (primary) hypertension: Secondary | ICD-10-CM

## 2023-12-04 DIAGNOSIS — E1169 Type 2 diabetes mellitus with other specified complication: Secondary | ICD-10-CM

## 2023-12-04 DIAGNOSIS — E1142 Type 2 diabetes mellitus with diabetic polyneuropathy: Secondary | ICD-10-CM | POA: Diagnosis not present

## 2023-12-04 DIAGNOSIS — N186 End stage renal disease: Secondary | ICD-10-CM

## 2023-12-04 DIAGNOSIS — E1165 Type 2 diabetes mellitus with hyperglycemia: Secondary | ICD-10-CM | POA: Diagnosis not present

## 2023-12-04 NOTE — Progress Notes (Signed)
 Complex Care Management Note  Care Guide Note 12/04/2023 Name: Daniel Johndrow Stringfellow MRN: [id number] DOB: 12-06-1970  Adrien BIRCH Burford is a 53 y.o. year old female who sees [place], Jayce G, DO for primary care. I reached out to Adrien BIRCH Squier by phone today to offer complex care management services.  Ms. Mastrogiovanni was given information about Complex Care Management services today including:   The Complex Care Management services include support from the care team which includes your Nurse Care Manager, Clinical Social Worker, or Pharmacist.  The Complex Care Management team is here to help remove barriers to the health concerns and goals most important to you. Complex Care Management services are voluntary, and the patient may decline or stop services at any time by request to their care team member.   Complex Care Management Consent Status: Patient agreed to services and verbal consent obtained.   Follow up plan:  Telephone appointment with complex care management team member scheduled for:  12/24/23  Encounter Outcome:  Patient Scheduled  Harlene Satterfield  [facility]  [facility], [facility] Guide  Direct Dial : [phone]  Fax [phone]

## 2023-12-11 DIAGNOSIS — E1165 Type 2 diabetes mellitus with hyperglycemia: Secondary | ICD-10-CM | POA: Diagnosis not present

## 2023-12-17 DIAGNOSIS — I361 Nonrheumatic tricuspid (valve) insufficiency: Secondary | ICD-10-CM | POA: Diagnosis not present

## 2023-12-17 DIAGNOSIS — E1122 Type 2 diabetes mellitus with diabetic chronic kidney disease: Secondary | ICD-10-CM | POA: Diagnosis not present

## 2023-12-17 DIAGNOSIS — I371 Nonrheumatic pulmonary valve insufficiency: Secondary | ICD-10-CM | POA: Diagnosis not present

## 2023-12-17 DIAGNOSIS — Z01818 Encounter for other preprocedural examination: Secondary | ICD-10-CM | POA: Diagnosis not present

## 2023-12-17 DIAGNOSIS — I5032 Chronic diastolic (congestive) heart failure: Secondary | ICD-10-CM | POA: Diagnosis not present

## 2023-12-17 DIAGNOSIS — N186 End stage renal disease: Secondary | ICD-10-CM | POA: Diagnosis not present

## 2023-12-17 DIAGNOSIS — E1165 Type 2 diabetes mellitus with hyperglycemia: Secondary | ICD-10-CM | POA: Diagnosis not present

## 2023-12-17 DIAGNOSIS — E782 Mixed hyperlipidemia: Secondary | ICD-10-CM | POA: Diagnosis not present

## 2023-12-17 DIAGNOSIS — I340 Nonrheumatic mitral (valve) insufficiency: Secondary | ICD-10-CM | POA: Diagnosis not present

## 2023-12-17 DIAGNOSIS — I358 Other nonrheumatic aortic valve disorders: Secondary | ICD-10-CM | POA: Diagnosis not present

## 2023-12-17 DIAGNOSIS — I5030 Unspecified diastolic (congestive) heart failure: Secondary | ICD-10-CM | POA: Diagnosis not present

## 2023-12-17 DIAGNOSIS — I517 Cardiomegaly: Secondary | ICD-10-CM | POA: Diagnosis not present

## 2023-12-18 DIAGNOSIS — N186 End stage renal disease: Secondary | ICD-10-CM | POA: Diagnosis not present

## 2023-12-18 DIAGNOSIS — N2581 Secondary hyperparathyroidism of renal origin: Secondary | ICD-10-CM | POA: Diagnosis not present

## 2023-12-18 DIAGNOSIS — D689 Coagulation defect, unspecified: Secondary | ICD-10-CM | POA: Diagnosis not present

## 2023-12-18 DIAGNOSIS — Z992 Dependence on renal dialysis: Secondary | ICD-10-CM | POA: Diagnosis not present

## 2023-12-19 DIAGNOSIS — I120 Hypertensive chronic kidney disease with stage 5 chronic kidney disease or end stage renal disease: Secondary | ICD-10-CM | POA: Diagnosis not present

## 2023-12-19 DIAGNOSIS — N186 End stage renal disease: Secondary | ICD-10-CM | POA: Diagnosis not present

## 2023-12-19 DIAGNOSIS — E1165 Type 2 diabetes mellitus with hyperglycemia: Secondary | ICD-10-CM | POA: Diagnosis not present

## 2023-12-22 DIAGNOSIS — N2581 Secondary hyperparathyroidism of renal origin: Secondary | ICD-10-CM | POA: Diagnosis not present

## 2023-12-22 DIAGNOSIS — N186 End stage renal disease: Secondary | ICD-10-CM | POA: Diagnosis not present

## 2023-12-22 DIAGNOSIS — D689 Coagulation defect, unspecified: Secondary | ICD-10-CM | POA: Diagnosis not present

## 2023-12-22 DIAGNOSIS — E1165 Type 2 diabetes mellitus with hyperglycemia: Secondary | ICD-10-CM | POA: Diagnosis not present

## 2023-12-22 DIAGNOSIS — Z992 Dependence on renal dialysis: Secondary | ICD-10-CM | POA: Diagnosis not present

## 2023-12-24 ENCOUNTER — Other Ambulatory Visit: Payer: Self-pay | Admitting: *Deleted

## 2023-12-24 DIAGNOSIS — E1165 Type 2 diabetes mellitus with hyperglycemia: Secondary | ICD-10-CM | POA: Diagnosis not present

## 2023-12-24 NOTE — Patient Outreach (Signed)
 Complex Care Management   Visit Note  12/24/2023  Name:  Ana [name] MRN: [id number] DOB: [birth year]  Situation: Referral received for Complex Care Management related to Diabetes with Complications and HTN I obtained verbal consent from Patient.  Visit completed with Patient  on the phone  Background:   Past Medical History:  Diagnosis Date   (HFpEF) heart failure with preserved ejection fraction (HCC)    a. echo in 10/2021 showing preserved EF of 60-65% with Grade 2 DD and small pericardial effusion.   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   Diabetes mellitus    type 2, diet controlled   Hypertension     Assessment: Patient Reported Symptoms:  Cognitive Cognitive Status: No symptoms reported Cognitive/Intellectual Conditions Management [RPT]: None reported or documented in medical history or problem list   Health Maintenance Behaviors: Annual physical exam Healing Pattern: Average Health Facilitated by: Rest  Neurological Neurological Review of Symptoms: No symptoms reported Neurological Self-Management Outcome: 4 (good)  HEENT HEENT Symptoms Reported: No symptoms reported HEENT Self-Management Outcome: 4 (good)    Cardiovascular Cardiovascular Symptoms Reported: No symptoms reported Does patient have uncontrolled Hypertension?: Yes Is patient checking Blood Pressure at home?: No Cardiovascular Management Strategies: Routine screening Cardiovascular Self-Management Outcome: 4 (good)  Respiratory Respiratory Symptoms Reported: No symptoms reported    Endocrine Endocrine Symptoms Reported: No symptoms reported Is patient diabetic?: Yes Is patient checking blood sugars at home?: No Endocrine Self-Management Outcome: 4 (good) Endocrine Comment: Has sensor, Needs refills  Gastrointestinal Gastrointestinal Symptoms Reported: No symptoms reported      Genitourinary Genitourinary Symptoms Reported: No symptoms  reported Genitourinary Management Strategies: Hemodialysis Hemodialysis Schedule: TTS Hemodialysis Last Treatment: 12/22/23 Genitourinary Self-Management Outcome: 4 (good)  Integumentary Integumentary Symptoms Reported: No symptoms reported    Musculoskeletal Musculoskelatal Symptoms Reviewed: Back pain Musculoskeletal Self-Management Outcome: 3 (uncertain) Number of falls in past year: 1 or less Was there an injury with Fall?: No Patient at Risk for Falls Due to: No Fall Risks Fall risk Follow up: Falls evaluation completed  Psychosocial Psychosocial Symptoms Reported: No symptoms reported Behavioral Management Strategies: Coping strategies Major Change/Loss/Stressor/Fears (CP): Denies Quality of Family Relationships: supportive, helpful, involved Do you feel physically threatened by others?: No    12/24/2023    PHQ2-9 Depression Screening   Little interest or pleasure in doing things Not at all  Feeling down, depressed, or hopeless Not at all  PHQ-2 - Total Score 0  Trouble falling or staying asleep, or sleeping too much    Feeling tired or having little energy    Poor appetite or overeating     Feeling bad about yourself - or that you are a failure or have let yourself or your family down    Trouble concentrating on things, such as reading the newspaper or watching television    Moving or speaking so slowly that other people could have noticed.  Or the opposite - being so fidgety or restless that you have been moving around a lot more than usual    Thoughts that you would be better off dead, or hurting yourself in some way    PHQ2-9 Total Score    If you checked off any problems, how difficult have these problems made it for you to do your work, take care of things at home, or get along with other people    Depression Interventions/Treatment      There  were no vitals filed for this visit.  Medications Reviewed Today     Reviewed by Bertrum Rosina HERO, RN (Registered Nurse)  on 12/24/23 at 1102  Med List Status: <None>   Medication Order Taking? Sig Documenting Provider Last Dose Status Informant  amLODipine  (NORVASC ) 10 MG tablet [id number] Yes TAKE 1 TABLET BY MOUTH DAILY Memon, Jehanzeb, MD  Active Child  calcitRIOL (ROCALTROL) 0.25 MCG capsule [id number] Yes Take 0.25 mcg by mouth daily. [provider]  Active Child  carvedilol  (COREG ) 25 MG tablet [id number] Yes Take 1 tablet (25 mg total) by mouth 2 (two) times daily. Cook, Jayce G, DO  Active   Continuous Glucose Sensor (FREESTYLE LIBRE 3 PLUS SENSOR) [place] [id number] Yes Use to check blood sugar. Change sensor every 15 days. Cook, Jayce G, DO  Active   furosemide  (LASIX ) 40 MG tablet [id number] Yes Take 2 tablets (80 mg total) by mouth 2 (two) times daily. Cook, Jayce G, DO  Active Child  hydrALAZINE  (APRESOLINE ) 100 MG tablet [id number] Yes Take 1 tablet (100 mg total) by mouth 3 (three) times daily. Cook, Jayce G, DO  Active   insulin  glargine (LANTUS ) 100 UNIT/ML Solostar Pen [id number] Yes Inject 12 Units into the skin daily. Cook, Jayce G, DO  Active   Insulin  Pen Needle 31G X 4 MM MISC [id number] Yes Use to administer insulin  daily. Cook, Jayce G, [place]  Active Child  Lancet Device MISC [id number] Yes Use up to 4 times daily to check blood sugar. May substitute to any manufacturer covered by patient's insurance. Cook, Jayce G, [place]  Active Child  levothyroxine  (SYNTHROID ) 50 MCG tablet [id number] Yes TAKE 1 TABLET(50 MCG) BY MOUTH EVERY DAY Cook, Jayce G, DO  Active   lidocaine -prilocaine (EMLA) cream [id number] Yes Apply 1 Application topically daily as needed. [provider]  Active Child            Recommendation:   Continue Current Plan of Care  Follow Up Plan:   Telephone follow-up in 2 weeks  Rosina Bertrum, BSN RN [facility], [facility] RN Care Manager Direct Dial : [phone]  Fax: [phone]

## 2023-12-24 NOTE — Patient Instructions (Signed)
 Visit Information  Ms. Ana [name] was given information about Medicaid Managed Care team care coordination services as a part of their Healthy [facility] Medicaid benefit. Ana [name]   If you would like to schedule transportation through your Healthy [facility] plan, please call the following number at least 2 days in advance of your appointment: [phone]  For information about your ride after you set it up, call Ride Assist at [phone]. Use this number to activate a Will Call pickup, or if your transportation is late for a scheduled pickup. Use this number, too, if you need to make a change or cancel a previously scheduled reservation.  If you need transportation services right away, call [phone]. The after-hours call center is staffed 24 hours to handle ride assistance and urgent reservation requests (including discharges) 365 days a year. Urgent trips include sick visits, hospital discharge requests and life-sustaining treatment.  Call the [facility] Line at [phone], at any time, 24 hours a day, 7 days a week. If you are in danger or need immediate medical attention call 911.   Please see education materials related to Diabetes, Hypertension provided by MyChart link.  Care plan and visit instructions communicated with the patient verbally today. Patient agrees to receive a copy via mail.   Telephone follow up appointment with Managed Medicaid care management team member scheduled for:01-07-2024 at 1:30 pm  Ana [name], BSN RN [facility], [facility] RN Care Manager Direct Dial : [phone]  Fax: [phone]   Following is a copy of your plan of care:  There are no care plans that you recently modified to display for this patient.

## 2023-12-25 DIAGNOSIS — E1165 Type 2 diabetes mellitus with hyperglycemia: Secondary | ICD-10-CM | POA: Diagnosis not present

## 2023-12-25 DIAGNOSIS — Z992 Dependence on renal dialysis: Secondary | ICD-10-CM | POA: Diagnosis not present

## 2023-12-25 DIAGNOSIS — D689 Coagulation defect, unspecified: Secondary | ICD-10-CM | POA: Diagnosis not present

## 2023-12-25 DIAGNOSIS — N186 End stage renal disease: Secondary | ICD-10-CM | POA: Diagnosis not present

## 2023-12-25 DIAGNOSIS — N2581 Secondary hyperparathyroidism of renal origin: Secondary | ICD-10-CM | POA: Diagnosis not present

## 2023-12-27 DIAGNOSIS — N186 End stage renal disease: Secondary | ICD-10-CM | POA: Diagnosis not present

## 2023-12-28 DIAGNOSIS — E1165 Type 2 diabetes mellitus with hyperglycemia: Secondary | ICD-10-CM | POA: Diagnosis not present

## 2023-12-29 DIAGNOSIS — E1165 Type 2 diabetes mellitus with hyperglycemia: Secondary | ICD-10-CM | POA: Diagnosis not present

## 2023-12-29 DIAGNOSIS — N186 End stage renal disease: Secondary | ICD-10-CM | POA: Diagnosis not present

## 2023-12-29 DIAGNOSIS — Z992 Dependence on renal dialysis: Secondary | ICD-10-CM | POA: Diagnosis not present

## 2023-12-31 DIAGNOSIS — E1165 Type 2 diabetes mellitus with hyperglycemia: Secondary | ICD-10-CM | POA: Diagnosis not present

## 2024-01-01 DIAGNOSIS — N186 End stage renal disease: Secondary | ICD-10-CM | POA: Diagnosis not present

## 2024-01-01 DIAGNOSIS — D631 Anemia in chronic kidney disease: Secondary | ICD-10-CM | POA: Diagnosis not present

## 2024-01-01 DIAGNOSIS — Z992 Dependence on renal dialysis: Secondary | ICD-10-CM | POA: Diagnosis not present

## 2024-01-01 DIAGNOSIS — D689 Coagulation defect, unspecified: Secondary | ICD-10-CM | POA: Diagnosis not present

## 2024-01-01 DIAGNOSIS — N2581 Secondary hyperparathyroidism of renal origin: Secondary | ICD-10-CM | POA: Diagnosis not present

## 2024-01-02 DIAGNOSIS — E1165 Type 2 diabetes mellitus with hyperglycemia: Secondary | ICD-10-CM | POA: Diagnosis not present

## 2024-01-03 DIAGNOSIS — Z992 Dependence on renal dialysis: Secondary | ICD-10-CM | POA: Diagnosis not present

## 2024-01-03 DIAGNOSIS — E1165 Type 2 diabetes mellitus with hyperglycemia: Secondary | ICD-10-CM | POA: Diagnosis not present

## 2024-01-05 DIAGNOSIS — D631 Anemia in chronic kidney disease: Secondary | ICD-10-CM | POA: Diagnosis not present

## 2024-01-05 DIAGNOSIS — N2581 Secondary hyperparathyroidism of renal origin: Secondary | ICD-10-CM | POA: Diagnosis not present

## 2024-01-05 DIAGNOSIS — Z992 Dependence on renal dialysis: Secondary | ICD-10-CM | POA: Diagnosis not present

## 2024-01-05 DIAGNOSIS — D689 Coagulation defect, unspecified: Secondary | ICD-10-CM | POA: Diagnosis not present

## 2024-01-05 DIAGNOSIS — E1165 Type 2 diabetes mellitus with hyperglycemia: Secondary | ICD-10-CM | POA: Diagnosis not present

## 2024-01-05 DIAGNOSIS — N186 End stage renal disease: Secondary | ICD-10-CM | POA: Diagnosis not present

## 2024-01-06 DIAGNOSIS — E1165 Type 2 diabetes mellitus with hyperglycemia: Secondary | ICD-10-CM | POA: Diagnosis not present

## 2024-01-07 ENCOUNTER — Other Ambulatory Visit: Payer: Self-pay | Admitting: *Deleted

## 2024-01-07 NOTE — Patient Instructions (Signed)
 Visit Information  Ana [name] was given information about Medicaid Managed Care team care coordination services as a part of their Healthy [facility] Medicaid benefit. [name]   If you would like to schedule transportation through your Healthy [facility] plan, please call the following number at least 2 days in advance of your appointment: [phone]  For information about your ride after you set it up, call Ride Assist at [phone]. Use this number to activate a Will Call pickup, or if your transportation is late for a scheduled pickup. Use this number, too, if you need to make a change or cancel a previously scheduled reservation.  If you need transportation services right away, call [phone]. The after-hours call center is staffed 24 hours to handle ride assistance and urgent reservation requests (including discharges) 365 days a year. Urgent trips include sick visits, hospital discharge requests and life-sustaining treatment.  Call the [facility] Line at [phone], at any time, 24 hours a day, 7 days a week. If you are in danger or need immediate medical attention call 911.   Please see education materials related to Diabetes, HTN provided by MyChart link.  Patient verbalizes understanding of instructions and care plan provided today and agrees to view in MyChart. Active MyChart status and patient understanding of how to access instructions and care plan via MyChart confirmed with patient.     Rosina Forte, BSN RN [facility], [facility] RN Care Manager Direct Dial : [phone]  Fax: [phone]   Following is a copy of your plan of care:  There are no care plans that you recently modified to display for this patient.

## 2024-01-07 NOTE — Patient Outreach (Signed)
 Complex Care Management   Visit Note  01/07/2024  Name:  Ana [name] MRN: [id number] DOB: October 12, 1970  Situation: Referral received for Complex Care Management related to Diabetes with Complications and HTN I obtained verbal consent from Patient.  Visit completed with Patient  on the phone  Background:   Past Medical History:  Diagnosis Date   (HFpEF) heart failure with preserved ejection fraction (HCC)    a. echo in 10/2021 showing preserved EF of 60-65% with Grade 2 DD and small pericardial effusion.   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   Diabetes mellitus    type 2, diet controlled   Hypertension     Assessment: Patient Reported Symptoms:  Cognitive Cognitive Status: No symptoms reported Cognitive/Intellectual Conditions Management [RPT]: None reported or documented in medical history or problem list   Health Maintenance Behaviors: Annual physical exam Healing Pattern: Average Health Facilitated by: Rest  Neurological Neurological Review of Symptoms: No symptoms reported Neurological Management Strategies: Routine screening Neurological Self-Management Outcome: 4 (good)  HEENT HEENT Symptoms Reported: No symptoms reported HEENT Management Strategies: Routine screening HEENT Self-Management Outcome: 4 (good)    Cardiovascular Cardiovascular Symptoms Reported: No symptoms reported Does patient have uncontrolled Hypertension?: Yes Is patient checking Blood Pressure at home?: Yes Patient's Recent BP reading at home: 141/70 (patient reported) Cardiovascular Management Strategies: Routine screening Cardiovascular Self-Management Outcome: 4 (good)  Respiratory Respiratory Symptoms Reported: No symptoms reported Respiratory Management Strategies: Routine screening Respiratory Self-Management Outcome: 4 (good)  Endocrine Endocrine Symptoms Reported: No symptoms reported Is patient diabetic?: Yes Is patient  checking blood sugars at home?: No Endocrine Self-Management Outcome: 3 (uncertain) Endocrine Comment: Needs refills  Gastrointestinal Gastrointestinal Symptoms Reported: No symptoms reported Gastrointestinal Self-Management Outcome: 4 (good)    Genitourinary   Genitourinary Management Strategies: Hemodialysis Hemodialysis Schedule: TTS Hemodialysis Last Treatment: 01/05/24 Genitourinary Self-Management Outcome: 4 (good)  Integumentary Integumentary Symptoms Reported: No symptoms reported Skin Self-Management Outcome: 4 (good)  Musculoskeletal Musculoskelatal Symptoms Reviewed: No symptoms reported Musculoskeletal Management Strategies: Routine screening Musculoskeletal Self-Management Outcome: 4 (good) Falls in the past year?: No Number of falls in past year: 1 or less Was there an injury with Fall?: No Fall Risk Category Calculator: 0 Patient Fall Risk Level: Low Fall Risk Patient at Risk for Falls Due to: No Fall Risks Fall risk Follow up: Falls evaluation completed  Psychosocial Psychosocial Symptoms Reported: No symptoms reported Behavioral Management Strategies: Coping strategies Behavioral Health Self-Management Outcome: 4 (good) Major Change/Loss/Stressor/Fears (CP): Denies Techniques to Cope with Loss/Stress/Change: Not applicable      01/07/2024    PHQ2-9 Depression Screening   Little interest or pleasure in doing things Not at all  Feeling down, depressed, or hopeless Not at all  PHQ-2 - Total Score 0  Trouble falling or staying asleep, or sleeping too much    Feeling tired or having little energy    Poor appetite or overeating     Feeling bad about yourself - or that you are a failure or have let yourself or your family down    Trouble concentrating on things, such as reading the newspaper or watching television    Moving or speaking so slowly that other people could have noticed.  Or the opposite - being so fidgety or restless that you have been moving around a  lot more than usual    Thoughts that you would be better off dead, or hurting yourself in some  way    PHQ2-9 Total Score    If you checked off any problems, how difficult have these problems made it for you to do your work, take care of things at home, or get along with other people    Depression Interventions/Treatment      Vitals:   01/07/24 1154  BP: (!) 141/70    Medications Reviewed Today     Reviewed by Bertrum Rosina HERO, RN (Registered Nurse) on 01/07/24 at 1144  Med List Status: <None>   Medication Order Taking? Sig Documenting Provider Last Dose Status Informant  amLODipine  (NORVASC ) 10 MG tablet [id number] Yes TAKE 1 TABLET BY MOUTH DAILY Memon, Jehanzeb, MD  Active Child  calcitRIOL (ROCALTROL) 0.25 MCG capsule [id number] Yes Take 0.25 mcg by mouth daily. [provider]  Active Child  carvedilol  (COREG ) 25 MG tablet [id number] Yes Take 1 tablet (25 mg total) by mouth 2 (two) times daily. Cook, Jayce G, DO  Active   Continuous Glucose Sensor (FREESTYLE LIBRE 3 PLUS SENSOR) [place] [id number] Yes Use to check blood sugar. Change sensor every 15 days. Cook, Jayce G, DO  Active   furosemide  (LASIX ) 40 MG tablet [id number] Yes Take 2 tablets (80 mg total) by mouth 2 (two) times daily. Cook, Jayce G, DO  Active Child  hydrALAZINE  (APRESOLINE ) 100 MG tablet [id number] Yes Take 1 tablet (100 mg total) by mouth 3 (three) times daily. Cook, Jayce G, DO  Active   insulin  glargine (LANTUS ) 100 UNIT/ML Solostar Pen [id number] Yes Inject 12 Units into the skin daily. Cook, Jayce G, DO  Active   Insulin  Pen Needle 31G X 4 MM MISC [id number] Yes Use to administer insulin  daily. Cook, Jayce G, [place]  Active Child  Lancet Device MISC [id number] Yes Use up to 4 times daily to check blood sugar. May substitute to any manufacturer covered by patient's insurance. Cook, Jayce G, DO  Active Child  levothyroxine  (SYNTHROID ) 50 MCG tablet [id number] Yes TAKE 1 TABLET(50 MCG) BY MOUTH EVERY DAY Bluford, Jayce G, DO   Active   lidocaine -prilocaine (EMLA) cream [id number] Yes Apply 1 Application topically daily as needed. [provider]  Active Child            Recommendation:   Diagnostic requests: Mammogram, Colon Screening, Pap Smear Continue Current Plan of Care Follow up and schedule: Dental clearance, Pap Smear with [facility], PCP for referral for mammogram and colon screening  Follow Up Plan:   Telephone follow-up in 1 month  Rosina Bertrum, BSN RN [facility], [facility] RN Care Manager Direct Dial : [phone]  Fax: [phone]

## 2024-01-08 DIAGNOSIS — D689 Coagulation defect, unspecified: Secondary | ICD-10-CM | POA: Diagnosis not present

## 2024-01-08 DIAGNOSIS — Z992 Dependence on renal dialysis: Secondary | ICD-10-CM | POA: Diagnosis not present

## 2024-01-08 DIAGNOSIS — N2581 Secondary hyperparathyroidism of renal origin: Secondary | ICD-10-CM | POA: Diagnosis not present

## 2024-01-08 DIAGNOSIS — E1165 Type 2 diabetes mellitus with hyperglycemia: Secondary | ICD-10-CM | POA: Diagnosis not present

## 2024-01-09 ENCOUNTER — Ambulatory Visit: Payer: Self-pay | Admitting: Family Medicine

## 2024-01-10 DIAGNOSIS — Z992 Dependence on renal dialysis: Secondary | ICD-10-CM | POA: Diagnosis not present

## 2024-01-10 DIAGNOSIS — D689 Coagulation defect, unspecified: Secondary | ICD-10-CM | POA: Diagnosis not present

## 2024-01-10 DIAGNOSIS — N186 End stage renal disease: Secondary | ICD-10-CM | POA: Diagnosis not present

## 2024-01-10 DIAGNOSIS — N2581 Secondary hyperparathyroidism of renal origin: Secondary | ICD-10-CM | POA: Diagnosis not present

## 2024-01-10 DIAGNOSIS — E1165 Type 2 diabetes mellitus with hyperglycemia: Secondary | ICD-10-CM | POA: Diagnosis not present

## 2024-01-11 DIAGNOSIS — E1165 Type 2 diabetes mellitus with hyperglycemia: Secondary | ICD-10-CM | POA: Diagnosis not present

## 2024-01-13 DIAGNOSIS — E1165 Type 2 diabetes mellitus with hyperglycemia: Secondary | ICD-10-CM | POA: Diagnosis not present

## 2024-01-14 DIAGNOSIS — E1165 Type 2 diabetes mellitus with hyperglycemia: Secondary | ICD-10-CM | POA: Diagnosis not present

## 2024-01-15 DIAGNOSIS — E1165 Type 2 diabetes mellitus with hyperglycemia: Secondary | ICD-10-CM | POA: Diagnosis not present

## 2024-01-16 DIAGNOSIS — E1165 Type 2 diabetes mellitus with hyperglycemia: Secondary | ICD-10-CM | POA: Diagnosis not present

## 2024-01-17 DIAGNOSIS — D689 Coagulation defect, unspecified: Secondary | ICD-10-CM | POA: Diagnosis not present

## 2024-01-17 DIAGNOSIS — N2581 Secondary hyperparathyroidism of renal origin: Secondary | ICD-10-CM | POA: Diagnosis not present

## 2024-01-17 DIAGNOSIS — Z992 Dependence on renal dialysis: Secondary | ICD-10-CM | POA: Diagnosis not present

## 2024-01-17 DIAGNOSIS — N186 End stage renal disease: Secondary | ICD-10-CM | POA: Diagnosis not present

## 2024-01-17 DIAGNOSIS — E1165 Type 2 diabetes mellitus with hyperglycemia: Secondary | ICD-10-CM | POA: Diagnosis not present

## 2024-01-19 DIAGNOSIS — E1165 Type 2 diabetes mellitus with hyperglycemia: Secondary | ICD-10-CM | POA: Diagnosis not present

## 2024-01-21 DIAGNOSIS — Z992 Dependence on renal dialysis: Secondary | ICD-10-CM | POA: Diagnosis not present

## 2024-01-21 DIAGNOSIS — D689 Coagulation defect, unspecified: Secondary | ICD-10-CM | POA: Diagnosis not present

## 2024-01-21 DIAGNOSIS — N2581 Secondary hyperparathyroidism of renal origin: Secondary | ICD-10-CM | POA: Diagnosis not present

## 2024-01-21 DIAGNOSIS — N186 End stage renal disease: Secondary | ICD-10-CM | POA: Diagnosis not present

## 2024-01-23 ENCOUNTER — Ambulatory Visit: Admitting: "Endocrinology

## 2024-01-23 DIAGNOSIS — N186 End stage renal disease: Secondary | ICD-10-CM | POA: Diagnosis not present

## 2024-01-28 ENCOUNTER — Other Ambulatory Visit (HOSPITAL_COMMUNITY)
Admission: RE | Admit: 2024-01-28 | Discharge: 2024-01-28 | Disposition: A | Source: Ambulatory Visit | Attending: Family Medicine | Admitting: Family Medicine

## 2024-01-28 ENCOUNTER — Encounter: Payer: Self-pay | Admitting: Family Medicine

## 2024-01-28 ENCOUNTER — Ambulatory Visit: Payer: Self-pay | Admitting: Family Medicine

## 2024-01-28 VITALS — BP 158/73 | HR 78 | Temp 97.9°F | Ht 62.0 in | Wt 196.0 lb

## 2024-01-28 DIAGNOSIS — I10 Essential (primary) hypertension: Secondary | ICD-10-CM | POA: Diagnosis not present

## 2024-01-28 DIAGNOSIS — Z124 Encounter for screening for malignant neoplasm of cervix: Secondary | ICD-10-CM | POA: Insufficient documentation

## 2024-01-28 DIAGNOSIS — E1169 Type 2 diabetes mellitus with other specified complication: Secondary | ICD-10-CM

## 2024-01-28 DIAGNOSIS — Z1231 Encounter for screening mammogram for malignant neoplasm of breast: Secondary | ICD-10-CM

## 2024-01-28 DIAGNOSIS — Z1211 Encounter for screening for malignant neoplasm of colon: Secondary | ICD-10-CM | POA: Diagnosis not present

## 2024-01-28 DIAGNOSIS — E785 Hyperlipidemia, unspecified: Secondary | ICD-10-CM | POA: Diagnosis not present

## 2024-01-28 NOTE — Progress Notes (Signed)
 Subjective:  Patient ID: Ana [name], female    DOB: 12-Oct-1970  Age: 53 y.o. MRN: [id number]  CC:   Chief Complaint  Patient presents with   clearance for kidney transplant list    [facility] system    HPI:  53 year old female presents for follow up.   Patient is going through transplant evaluation at [facility] (Renal transplant). Needs colon cancer screening, pap smear, mammogram, and dental clearance. Will discuss and complete these items as soon as possible.  Diabetes remains uncontrolled. Missed appt with Endo. Needs to reschedule.  Patient Active Problem List   Diagnosis Date Noted   Screening for cervical cancer 01/28/2024   Colon cancer screening 01/28/2024   ESRD (end stage renal disease) (HCC) 09/25/2022   Poor vision 08/06/2022   (HFpEF) heart failure with preserved ejection fraction (HCC) 08/04/2022   Iron deficiency anemia 02/28/2022   Hypothyroidism, adult 09/02/2020   Vitamin D deficiency disease 09/02/2020   Type 2 diabetes mellitus with hyperlipidemia (HCC) 09/26/2017   Essential hypertension, benign 09/26/2017   Mixed hyperlipidemia 09/26/2017    Social Hx   Social History   Socioeconomic History   Marital status: Single    Spouse name: Not on file   Number of children: Not on file   Years of education: Not on file   Highest education level: Not on file  Occupational History   Not on file  Tobacco Use   Smoking status: Never   Smokeless tobacco: Never  Vaping Use   Vaping status: Never Used  Substance and Sexual Activity   Alcohol use: Not Currently    Comment: occ   Drug use: No   Sexual activity: Yes    Birth control/protection: Surgical  Other Topics Concern   Not on file  Social History Narrative   Divorced,married for 8 years.Lives with daughter and 3 grandkids.Unemployed.   Social Drivers of Corporate Investment Banker Strain: Not on file  Food Insecurity: No Food Insecurity (12/24/2023)   Hunger Vital Sign    Worried  About Running Out of Food in the Last Year: Never true    Ran Out of Food in the Last Year: Never true  Transportation Needs: No Transportation Needs (12/24/2023)   PRAPARE - Administrator, Civil Service (Medical): No    Lack of Transportation (Non-Medical): No  Physical Activity: Not on file  Stress: Not on file  Social Connections: Not on file    Review of Systems Per HPI  Objective:  BP (!) 158/73   Pulse 78   Temp 97.9 F (36.6 C)   Ht 5' 2 (1.575 m)   Wt 196 lb (88.9 kg)   LMP 09/22/2019 (Approximate)   SpO2 96%   BMI 35.85 kg/m      01/28/2024   11:06 AM 01/07/2024   11:54 AM 11/22/2023   11:08 AM  BP/Weight  Systolic BP 158 141 138  Diastolic BP 73 70 86  Wt. (Lbs) 196  189  BMI 35.85 kg/m2  34.57 kg/m2    Physical Exam Vitals and nursing note reviewed. Exam conducted with a chaperone present.  Constitutional:      General: She is not in acute distress.    Appearance: Normal appearance.  HENT:     Head: Normocephalic and atraumatic.  Cardiovascular:     Rate and Rhythm: Normal rate and regular rhythm.  Pulmonary:     Effort: Pulmonary effort is normal.     Breath sounds: Normal  breath sounds. No wheezing or rales.  Genitourinary:    General: Normal vulva.     Vagina: Vaginal discharge present.     Cervix: Normal.     Comments: Pap smear completed.  Neurological:     Mental Status: She is alert.     Lab Results  Component Value Date   WBC 7.9 08/23/2022   HGB 10.5 (L) 11/13/2022   HCT 31.0 (L) 11/13/2022   PLT 290 08/23/2022   GLUCOSE 219 (H) 11/13/2022   CHOL 133 08/04/2022   TRIG 119 08/04/2022   HDL 42 08/04/2022   LDLCALC 70 08/04/2022   ALT 34 03/18/2022   AST 36 03/18/2022   NA 138 11/13/2022   K 3.4 (L) 11/13/2022   CL 93 (L) 11/13/2022   CREATININE 6.00 (H) 11/13/2022   BUN 34 (H) 11/13/2022   CO2 25 08/23/2022   TSH 2.013 03/18/2022   INR 1.0 12/22/2021   HGBA1C 10.9 (H) 08/04/2022     Assessment & Plan:   Essential hypertension, benign Assessment & Plan: BP mildly elevated today. Will continue to monitor. Given HD and concern for hypotension, no med changes today.   Colon cancer screening Assessment & Plan: Patient does not want colonoscopy. Proceeding with cologuard.  Orders: -     Cologuard  Encounter for screening mammogram for malignant neoplasm of breast -     3D Screening Mammogram, Left and Right  Screening for cervical cancer Assessment & Plan: Pap smear performed today.  Orders: -     Cytology - PAP  Type 2 diabetes mellitus with hyperlipidemia (HCC) Assessment & Plan: Uncontrolled. Patient to reschedule Endo appt.    Follow-up:  3 months  Leniya Breit Bluford DO [facility]

## 2024-01-28 NOTE — Assessment & Plan Note (Signed)
 Patient does not want colonoscopy. Proceeding with cologuard.

## 2024-01-28 NOTE — Assessment & Plan Note (Signed)
 Uncontrolled. Patient to reschedule Endo appt.

## 2024-01-28 NOTE — Patient Instructions (Addendum)
 Call [phone] to schedule mammogram.  Call and reschedule appt with Endocrinology.  [phone]  Cologuard ordered.  Follow up in 3 months.

## 2024-01-28 NOTE — Assessment & Plan Note (Signed)
 BP mildly elevated today. Will continue to monitor. Given HD and concern for hypotension, no med changes today.

## 2024-01-28 NOTE — Assessment & Plan Note (Signed)
Pap smear performed today. 

## 2024-01-29 ENCOUNTER — Ambulatory Visit: Payer: Self-pay | Admitting: Family Medicine

## 2024-01-29 ENCOUNTER — Other Ambulatory Visit: Payer: Self-pay | Admitting: Family Medicine

## 2024-01-29 DIAGNOSIS — D689 Coagulation defect, unspecified: Secondary | ICD-10-CM | POA: Diagnosis not present

## 2024-01-29 DIAGNOSIS — Z992 Dependence on renal dialysis: Secondary | ICD-10-CM | POA: Diagnosis not present

## 2024-01-29 DIAGNOSIS — R52 Pain, unspecified: Secondary | ICD-10-CM | POA: Diagnosis not present

## 2024-01-29 DIAGNOSIS — N186 End stage renal disease: Secondary | ICD-10-CM | POA: Diagnosis not present

## 2024-01-29 DIAGNOSIS — N2581 Secondary hyperparathyroidism of renal origin: Secondary | ICD-10-CM | POA: Diagnosis not present

## 2024-01-29 LAB — CYTOLOGY - PAP
Adequacy: ABSENT
Comment: NEGATIVE
Diagnosis: NEGATIVE
High risk HPV: NEGATIVE

## 2024-01-29 MED ORDER — METRONIDAZOLE 500 MG PO TABS: 500.0000 mg | ORAL_TABLET | Freq: Two times a day (BID) | ORAL | 0 refills | Status: AC

## 2024-01-31 DIAGNOSIS — N186 End stage renal disease: Secondary | ICD-10-CM | POA: Diagnosis not present

## 2024-01-31 DIAGNOSIS — N2581 Secondary hyperparathyroidism of renal origin: Secondary | ICD-10-CM | POA: Diagnosis not present

## 2024-01-31 DIAGNOSIS — Z992 Dependence on renal dialysis: Secondary | ICD-10-CM | POA: Diagnosis not present

## 2024-01-31 DIAGNOSIS — R52 Pain, unspecified: Secondary | ICD-10-CM | POA: Diagnosis not present

## 2024-01-31 DIAGNOSIS — D689 Coagulation defect, unspecified: Secondary | ICD-10-CM | POA: Diagnosis not present

## 2024-02-05 DIAGNOSIS — D689 Coagulation defect, unspecified: Secondary | ICD-10-CM | POA: Diagnosis not present

## 2024-02-05 DIAGNOSIS — N2581 Secondary hyperparathyroidism of renal origin: Secondary | ICD-10-CM | POA: Diagnosis not present

## 2024-02-05 DIAGNOSIS — N186 End stage renal disease: Secondary | ICD-10-CM | POA: Diagnosis not present

## 2024-02-05 DIAGNOSIS — Z992 Dependence on renal dialysis: Secondary | ICD-10-CM | POA: Diagnosis not present

## 2024-02-07 DIAGNOSIS — Z992 Dependence on renal dialysis: Secondary | ICD-10-CM | POA: Diagnosis not present

## 2024-02-07 DIAGNOSIS — D689 Coagulation defect, unspecified: Secondary | ICD-10-CM | POA: Diagnosis not present

## 2024-02-07 DIAGNOSIS — N2581 Secondary hyperparathyroidism of renal origin: Secondary | ICD-10-CM | POA: Diagnosis not present

## 2024-02-07 DIAGNOSIS — N186 End stage renal disease: Secondary | ICD-10-CM | POA: Diagnosis not present

## 2024-02-09 DIAGNOSIS — N186 End stage renal disease: Secondary | ICD-10-CM | POA: Diagnosis not present

## 2024-02-09 DIAGNOSIS — Z992 Dependence on renal dialysis: Secondary | ICD-10-CM | POA: Diagnosis not present

## 2024-02-09 DIAGNOSIS — N2581 Secondary hyperparathyroidism of renal origin: Secondary | ICD-10-CM | POA: Diagnosis not present

## 2024-02-14 DIAGNOSIS — N2581 Secondary hyperparathyroidism of renal origin: Secondary | ICD-10-CM | POA: Diagnosis not present

## 2024-02-14 DIAGNOSIS — D689 Coagulation defect, unspecified: Secondary | ICD-10-CM | POA: Diagnosis not present

## 2024-02-14 DIAGNOSIS — Z992 Dependence on renal dialysis: Secondary | ICD-10-CM | POA: Diagnosis not present

## 2024-02-14 DIAGNOSIS — N186 End stage renal disease: Secondary | ICD-10-CM | POA: Diagnosis not present

## 2024-02-15 LAB — COLOGUARD: COLOGUARD: POSITIVE — AB

## 2024-02-16 DIAGNOSIS — N186 End stage renal disease: Secondary | ICD-10-CM | POA: Diagnosis not present

## 2024-02-16 DIAGNOSIS — Z992 Dependence on renal dialysis: Secondary | ICD-10-CM | POA: Diagnosis not present

## 2024-02-16 DIAGNOSIS — N2581 Secondary hyperparathyroidism of renal origin: Secondary | ICD-10-CM | POA: Diagnosis not present

## 2024-02-25 ENCOUNTER — Ambulatory Visit: Admitting: Family Medicine

## 2024-03-10 ENCOUNTER — Inpatient Hospital Stay (HOSPITAL_COMMUNITY): Admission: RE | Admit: 2024-03-10 | Source: Ambulatory Visit

## 2024-03-20 ENCOUNTER — Other Ambulatory Visit: Payer: Self-pay

## 2024-03-24 ENCOUNTER — Telehealth: Payer: Self-pay | Admitting: Pharmacy Technician

## 2024-03-24 ENCOUNTER — Other Ambulatory Visit (HOSPITAL_COMMUNITY): Payer: Self-pay

## 2024-03-24 NOTE — Telephone Encounter (Signed)
 DISREGARD-Patient dismissed

## 2024-03-24 NOTE — Telephone Encounter (Signed)
 Pharmacy Patient Advocate Encounter   Received notification from [facility] KEY that prior authorization for FreeStyle Libre 3 Plus Sensor is due for renewal.   Insurance verification completed.   The patient is insured through [employer].  Action: PA required; PA started via CoverMyMeds. KEY [id number] . Waiting for clinical questions to populate.

## 2024-03-26 ENCOUNTER — Other Ambulatory Visit (HOSPITAL_COMMUNITY): Payer: Self-pay

## 2024-04-28 ENCOUNTER — Ambulatory Visit: Admitting: Family Medicine

## 2024-06-05 ENCOUNTER — Ambulatory Visit: Admitting: Internal Medicine
